# Patient Record
Sex: Male | Born: 1955 | Race: Black or African American | Hispanic: No | Marital: Married | Smoking: Former smoker
Health system: Southern US, Community
[De-identification: ages and names within clinical notes are randomized; demographics above are authoritative.]

## PROBLEM LIST (undated history)

## (undated) DIAGNOSIS — N189 Chronic kidney disease, unspecified: Secondary | ICD-10-CM

## (undated) DIAGNOSIS — F32A Depression, unspecified: Secondary | ICD-10-CM

## (undated) DIAGNOSIS — F329 Major depressive disorder, single episode, unspecified: Secondary | ICD-10-CM

## (undated) DIAGNOSIS — R4701 Aphasia: Secondary | ICD-10-CM

## (undated) DIAGNOSIS — I639 Cerebral infarction, unspecified: Secondary | ICD-10-CM

## (undated) DIAGNOSIS — M1990 Unspecified osteoarthritis, unspecified site: Secondary | ICD-10-CM

## (undated) DIAGNOSIS — I10 Essential (primary) hypertension: Secondary | ICD-10-CM

## (undated) DIAGNOSIS — M109 Gout, unspecified: Secondary | ICD-10-CM

## (undated) DIAGNOSIS — R569 Unspecified convulsions: Secondary | ICD-10-CM

## (undated) DIAGNOSIS — G9050 Complex regional pain syndrome I, unspecified: Secondary | ICD-10-CM

## (undated) DIAGNOSIS — I69391 Dysphagia following cerebral infarction: Secondary | ICD-10-CM

## (undated) HISTORY — PX: NO PAST SURGERIES: SHX2092

---

## 2004-03-24 ENCOUNTER — Emergency Department (HOSPITAL_COMMUNITY): Admission: EM | Admit: 2004-03-24 | Discharge: 2004-03-24 | Payer: Self-pay | Admitting: Emergency Medicine

## 2005-06-21 ENCOUNTER — Emergency Department (HOSPITAL_COMMUNITY): Admission: EM | Admit: 2005-06-21 | Discharge: 2005-06-21 | Payer: Self-pay | Admitting: Emergency Medicine

## 2005-06-26 ENCOUNTER — Ambulatory Visit: Payer: Self-pay | Admitting: Internal Medicine

## 2005-06-27 ENCOUNTER — Ambulatory Visit: Payer: Self-pay | Admitting: *Deleted

## 2005-06-29 ENCOUNTER — Ambulatory Visit: Payer: Self-pay | Admitting: Internal Medicine

## 2005-07-05 ENCOUNTER — Ambulatory Visit: Payer: Self-pay | Admitting: Internal Medicine

## 2005-10-03 ENCOUNTER — Ambulatory Visit: Payer: Self-pay | Admitting: Internal Medicine

## 2005-10-27 ENCOUNTER — Ambulatory Visit: Payer: Self-pay | Admitting: Internal Medicine

## 2005-11-15 ENCOUNTER — Encounter (INDEPENDENT_AMBULATORY_CARE_PROVIDER_SITE_OTHER): Payer: Self-pay | Admitting: Internal Medicine

## 2005-12-01 ENCOUNTER — Ambulatory Visit: Payer: Self-pay | Admitting: Family Medicine

## 2005-12-06 ENCOUNTER — Ambulatory Visit (HOSPITAL_COMMUNITY): Admission: RE | Admit: 2005-12-06 | Discharge: 2005-12-06 | Payer: Self-pay | Admitting: Family Medicine

## 2005-12-08 ENCOUNTER — Ambulatory Visit: Payer: Self-pay | Admitting: Family Medicine

## 2005-12-14 ENCOUNTER — Ambulatory Visit: Payer: Self-pay | Admitting: Family Medicine

## 2006-01-03 ENCOUNTER — Ambulatory Visit: Payer: Self-pay | Admitting: Family Medicine

## 2006-02-02 ENCOUNTER — Emergency Department (HOSPITAL_COMMUNITY): Admission: EM | Admit: 2006-02-02 | Discharge: 2006-02-02 | Payer: Self-pay | Admitting: *Deleted

## 2006-02-07 ENCOUNTER — Ambulatory Visit: Payer: Self-pay | Admitting: Family Medicine

## 2006-02-16 ENCOUNTER — Ambulatory Visit: Payer: Self-pay | Admitting: Family Medicine

## 2006-02-17 LAB — CONVERTED CEMR LAB
CO2: 25 meq/L
Calcium: 9 mg/dL
Creatinine, Ser: 2.33 mg/dL

## 2006-05-04 ENCOUNTER — Ambulatory Visit: Payer: Self-pay | Admitting: Internal Medicine

## 2006-05-05 LAB — CONVERTED CEMR LAB
CO2: 24 meq/L
Calcium: 9.8 mg/dL
Creatinine, Ser: 2.13 mg/dL
Glucose, Bld: 83 mg/dL
Sodium: 143 meq/L

## 2006-07-18 ENCOUNTER — Emergency Department (HOSPITAL_COMMUNITY): Admission: EM | Admit: 2006-07-18 | Discharge: 2006-07-18 | Payer: Self-pay | Admitting: Pediatrics

## 2006-08-18 ENCOUNTER — Encounter (INDEPENDENT_AMBULATORY_CARE_PROVIDER_SITE_OTHER): Payer: Self-pay | Admitting: Internal Medicine

## 2006-08-18 DIAGNOSIS — M109 Gout, unspecified: Secondary | ICD-10-CM

## 2006-08-18 DIAGNOSIS — I10 Essential (primary) hypertension: Secondary | ICD-10-CM

## 2006-08-18 DIAGNOSIS — N185 Chronic kidney disease, stage 5: Secondary | ICD-10-CM

## 2006-09-27 ENCOUNTER — Emergency Department (HOSPITAL_COMMUNITY): Admission: EM | Admit: 2006-09-27 | Discharge: 2006-09-27 | Payer: Self-pay | Admitting: Emergency Medicine

## 2006-12-20 ENCOUNTER — Emergency Department (HOSPITAL_COMMUNITY): Admission: EM | Admit: 2006-12-20 | Discharge: 2006-12-20 | Payer: Self-pay | Admitting: Emergency Medicine

## 2007-01-02 ENCOUNTER — Encounter (INDEPENDENT_AMBULATORY_CARE_PROVIDER_SITE_OTHER): Payer: Self-pay | Admitting: *Deleted

## 2007-01-25 ENCOUNTER — Telehealth (INDEPENDENT_AMBULATORY_CARE_PROVIDER_SITE_OTHER): Payer: Self-pay | Admitting: Internal Medicine

## 2007-02-07 ENCOUNTER — Ambulatory Visit: Payer: Self-pay | Admitting: Nurse Practitioner

## 2007-02-07 DIAGNOSIS — R21 Rash and other nonspecific skin eruption: Secondary | ICD-10-CM | POA: Insufficient documentation

## 2007-02-07 DIAGNOSIS — J309 Allergic rhinitis, unspecified: Secondary | ICD-10-CM | POA: Insufficient documentation

## 2007-02-07 LAB — CONVERTED CEMR LAB
ALT: 11 units/L (ref 0–53)
AST: 13 units/L (ref 0–37)
Albumin: 4.9 g/dL (ref 3.5–5.2)
Basophils Absolute: 0.1 10*3/uL (ref 0.0–0.1)
Basophils Relative: 2 % — ABNORMAL HIGH (ref 0–1)
Calcium: 9.7 mg/dL (ref 8.4–10.5)
Chloride: 102 meq/L (ref 96–112)
MCHC: 32.8 g/dL (ref 30.0–36.0)
Monocytes Relative: 8 % (ref 3–11)
Neutro Abs: 1.5 10*3/uL — ABNORMAL LOW (ref 1.7–7.7)
Neutrophils Relative %: 44 % (ref 43–77)
PSA: 0.54 ng/mL (ref 0.10–4.00)
Potassium: 4.3 meq/L (ref 3.5–5.3)
RBC: 4.55 M/uL (ref 4.22–5.81)
Sodium: 140 meq/L (ref 135–145)
TSH: 1.23 microintl units/mL (ref 0.350–5.50)
Total Protein: 8.4 g/dL — ABNORMAL HIGH (ref 6.0–8.3)
Uric Acid, Serum: 8.7 mg/dL — ABNORMAL HIGH (ref 2.4–7.0)

## 2007-02-25 ENCOUNTER — Encounter (INDEPENDENT_AMBULATORY_CARE_PROVIDER_SITE_OTHER): Payer: Self-pay | Admitting: Nurse Practitioner

## 2007-05-20 ENCOUNTER — Telehealth (INDEPENDENT_AMBULATORY_CARE_PROVIDER_SITE_OTHER): Payer: Self-pay | Admitting: Nurse Practitioner

## 2007-06-25 ENCOUNTER — Telehealth (INDEPENDENT_AMBULATORY_CARE_PROVIDER_SITE_OTHER): Payer: Self-pay | Admitting: Nurse Practitioner

## 2010-10-16 DIAGNOSIS — I639 Cerebral infarction, unspecified: Secondary | ICD-10-CM

## 2010-10-16 HISTORY — DX: Cerebral infarction, unspecified: I63.9

## 2011-02-02 LAB — DIFFERENTIAL
Basophils Relative: 1
Eosinophils Absolute: 0.1
Monocytes Relative: 9
Neutro Abs: 2.7
Neutrophils Relative %: 55

## 2011-02-02 LAB — CBC
MCHC: 34.3
Platelets: 157
RBC: 4.11 — ABNORMAL LOW
WBC: 5

## 2011-02-02 LAB — I-STAT 8, (EC8 V) (CONVERTED LAB)
Acid-base deficit: 5 — ABNORMAL HIGH
BUN: 29 — ABNORMAL HIGH
Bicarbonate: 19.8 — ABNORMAL LOW
Chloride: 113 — ABNORMAL HIGH
HCT: 41
Hemoglobin: 13.9
Operator id: 198171
Sodium: 140

## 2011-02-02 LAB — POCT CARDIAC MARKERS
Myoglobin, poc: 94.3
Operator id: 198171

## 2011-02-02 LAB — D-DIMER, QUANTITATIVE: D-Dimer, Quant: <0.22

## 2011-02-02 LAB — POCT I-STAT CREATININE: Creatinine, Ser: 2.3 — ABNORMAL HIGH

## 2011-04-18 DIAGNOSIS — G9050 Complex regional pain syndrome I, unspecified: Secondary | ICD-10-CM

## 2011-04-18 HISTORY — DX: Complex regional pain syndrome I, unspecified: G90.50

## 2011-05-15 ENCOUNTER — Emergency Department (HOSPITAL_COMMUNITY)
Admission: EM | Admit: 2011-05-15 | Discharge: 2011-05-15 | Disposition: A | Discharge status: Other Acute Inpatient Hospital | Payer: Self-pay | Source: Home / Self Care

## 2011-10-06 ENCOUNTER — Emergency Department (INDEPENDENT_AMBULATORY_CARE_PROVIDER_SITE_OTHER)
Admission: EM | Admit: 2011-10-06 | Discharge: 2011-10-06 | Disposition: A | Discharge status: Home / Self Care | Payer: Self-pay | Source: Home / Self Care | Attending: Family Medicine | Admitting: Family Medicine

## 2011-10-06 ENCOUNTER — Encounter (HOSPITAL_COMMUNITY): Payer: Self-pay

## 2011-10-06 DIAGNOSIS — M7551 Bursitis of right shoulder: Secondary | ICD-10-CM

## 2011-10-06 DIAGNOSIS — M67919 Unspecified disorder of synovium and tendon, unspecified shoulder: Secondary | ICD-10-CM

## 2011-10-06 DIAGNOSIS — M1710 Unilateral primary osteoarthritis, unspecified knee: Secondary | ICD-10-CM

## 2011-10-06 HISTORY — DX: Essential (primary) hypertension: I10

## 2011-10-06 HISTORY — DX: Cerebral infarction, unspecified: I63.9

## 2011-10-06 MED ORDER — METHYLPREDNISOLONE ACETATE 80 MG/ML IJ SUSP
INTRAMUSCULAR | Status: AC
  Filled 2011-10-06: qty 1

## 2011-10-06 MED ORDER — METHYLPREDNISOLONE ACETATE 40 MG/ML IJ SUSP
80.0000 mg | Freq: Once | INTRAMUSCULAR | Status: AC
  Administered 2011-10-06: 80 mg via INTRAMUSCULAR

## 2011-10-06 MED ORDER — METHYLPREDNISOLONE 4 MG PO KIT: PACK | ORAL | Status: AC

## 2011-10-06 NOTE — ED Notes (Signed)
Pt states he had stroke about 6 months ago- reports having "soreness" in rt leg and to rt shoulder since the stroke.

## 2011-10-06 NOTE — ED Provider Notes (Signed)
History     CSN: [id number]  Arrival date & time 10/06/11  1057   First MD Initiated Contact with Patient 10/06/11 1102      Chief Complaint  Patient presents with  . Pain    (Consider location/radiation/quality/duration/timing/severity/associated sxs/prior treatment) Patient is a 56 y.o. male presenting with shoulder pain. The history is provided by the patient.  Shoulder Pain This is a chronic problem. The current episode started more than 1 week ago (24mo h/o pain since right sided cva, also involving right leg.). The problem occurs constantly. The problem has been gradually worsening. Pertinent negatives include no chest pain. He has tried nothing for the symptoms.    Past Medical History  Diagnosis Date  . Hypertension   . Stroke     History reviewed. No pertinent past surgical history.  No family history on file.  History  Substance Use Topics  . Smoking status: Never Smoker   . Smokeless tobacco: Not on file  . Alcohol Use: Yes      Review of Systems  Constitutional: Negative.   Respiratory: Negative for chest tightness.   Cardiovascular: Negative for chest pain and leg swelling.  Gastrointestinal: Negative.   Genitourinary: Negative.   Musculoskeletal: Positive for arthralgias. Negative for joint swelling.    Allergies  Nsaids  Home Medications   Current Outpatient Rx  Name Route Sig Dispense Refill  . PRESCRIPTION MEDICATION  Pt states he is on BP medication and blood thinner, cannot recall all of medications    . METHYLPREDNISOLONE 4 MG PO KIT  follow package directions start on sat, take until finished 21 tablet 0    BP 123/61  Pulse 76  Temp 98 F (36.7 C) (Oral)  Resp 20  SpO2 98%  Physical Exam  Nursing note and vitals reviewed. Constitutional: He is oriented to person, place, and time. He appears well-developed and well-nourished.  Abdominal: Bowel sounds are normal. There is no tenderness.  Musculoskeletal: He exhibits tenderness.         Arms: Neurological: He is alert and oriented to person, place, and time.  Skin: Skin is warm and dry.    ED Course  Procedures (including critical care time)  Labs Reviewed - No data to display No results found.   1. Bursitis of shoulder, right   2. Degenerative arthritis of knee       MDM         Linna Hoff, MD 10/06/11 1144

## 2011-10-06 NOTE — Discharge Instructions (Signed)
Take medicine as prescribed and see orthopedist if further problems.

## 2011-12-30 ENCOUNTER — Inpatient Hospital Stay (HOSPITAL_COMMUNITY): Payer: Medicaid Other

## 2011-12-30 ENCOUNTER — Encounter (HOSPITAL_COMMUNITY): Payer: Self-pay | Admitting: Internal Medicine

## 2011-12-30 ENCOUNTER — Emergency Department (HOSPITAL_COMMUNITY): Payer: Medicaid Other

## 2011-12-30 ENCOUNTER — Inpatient Hospital Stay (HOSPITAL_COMMUNITY)
Admission: EM | Admit: 2011-12-30 | Discharge: 2012-01-03 | DRG: 065 | Disposition: A | Discharge status: Rehabilitation Facility | Payer: Medicaid Other | Attending: Neurology | Admitting: Neurology

## 2011-12-30 DIAGNOSIS — I629 Nontraumatic intracranial hemorrhage, unspecified: Secondary | ICD-10-CM

## 2011-12-30 DIAGNOSIS — I120 Hypertensive chronic kidney disease with stage 5 chronic kidney disease or end stage renal disease: Secondary | ICD-10-CM | POA: Diagnosis present

## 2011-12-30 DIAGNOSIS — Z6834 Body mass index (BMI) 34.0-34.9, adult: Secondary | ICD-10-CM

## 2011-12-30 DIAGNOSIS — Z8673 Personal history of transient ischemic attack (TIA), and cerebral infarction without residual deficits: Secondary | ICD-10-CM

## 2011-12-30 DIAGNOSIS — I619 Nontraumatic intracerebral hemorrhage, unspecified: Principal | ICD-10-CM

## 2011-12-30 DIAGNOSIS — I10 Essential (primary) hypertension: Secondary | ICD-10-CM | POA: Diagnosis present

## 2011-12-30 DIAGNOSIS — J309 Allergic rhinitis, unspecified: Secondary | ICD-10-CM

## 2011-12-30 DIAGNOSIS — M109 Gout, unspecified: Secondary | ICD-10-CM

## 2011-12-30 DIAGNOSIS — F32A Depression, unspecified: Secondary | ICD-10-CM

## 2011-12-30 DIAGNOSIS — N185 Chronic kidney disease, stage 5: Secondary | ICD-10-CM | POA: Diagnosis present

## 2011-12-30 DIAGNOSIS — G8190 Hemiplegia, unspecified affecting unspecified side: Secondary | ICD-10-CM | POA: Diagnosis present

## 2011-12-30 DIAGNOSIS — I69920 Aphasia following unspecified cerebrovascular disease: Secondary | ICD-10-CM

## 2011-12-30 DIAGNOSIS — G4733 Obstructive sleep apnea (adult) (pediatric): Secondary | ICD-10-CM | POA: Diagnosis present

## 2011-12-30 DIAGNOSIS — E66811 Obesity, class 1: Secondary | ICD-10-CM | POA: Diagnosis present

## 2011-12-30 DIAGNOSIS — I69320 Aphasia following cerebral infarction: Secondary | ICD-10-CM

## 2011-12-30 DIAGNOSIS — F329 Major depressive disorder, single episode, unspecified: Secondary | ICD-10-CM

## 2011-12-30 DIAGNOSIS — E870 Hyperosmolality and hypernatremia: Secondary | ICD-10-CM | POA: Diagnosis not present

## 2011-12-30 DIAGNOSIS — N289 Disorder of kidney and ureter, unspecified: Secondary | ICD-10-CM | POA: Diagnosis present

## 2011-12-30 DIAGNOSIS — R1310 Dysphagia, unspecified: Secondary | ICD-10-CM | POA: Diagnosis present

## 2011-12-30 DIAGNOSIS — I610 Nontraumatic intracerebral hemorrhage in hemisphere, subcortical: Secondary | ICD-10-CM | POA: Diagnosis present

## 2011-12-30 DIAGNOSIS — E669 Obesity, unspecified: Secondary | ICD-10-CM | POA: Diagnosis present

## 2011-12-30 DIAGNOSIS — R471 Dysarthria and anarthria: Secondary | ICD-10-CM | POA: Diagnosis present

## 2011-12-30 DIAGNOSIS — I6930 Unspecified sequelae of cerebral infarction: Secondary | ICD-10-CM

## 2011-12-30 DIAGNOSIS — R21 Rash and other nonspecific skin eruption: Secondary | ICD-10-CM

## 2011-12-30 LAB — DIFFERENTIAL
Eosinophils Absolute: 0.4 10*3/uL (ref 0.0–0.7)
Lymphs Abs: 2.5 10*3/uL (ref 0.7–4.0)
Monocytes Absolute: 0.5 10*3/uL (ref 0.1–1.0)
Neutrophils Relative %: 44 % (ref 43–77)

## 2011-12-30 LAB — PROTIME-INR
INR: 0.93 (ref 0.00–1.49)
Prothrombin Time: 12.7 seconds (ref 11.6–15.2)

## 2011-12-30 LAB — COMPREHENSIVE METABOLIC PANEL
AST: 22 U/L (ref 0–37)
BUN: 39 mg/dL — ABNORMAL HIGH (ref 6–23)
CO2: 21 mEq/L (ref 19–32)
Calcium: 10.1 mg/dL (ref 8.4–10.5)
Creatinine, Ser: 2.17 mg/dL — ABNORMAL HIGH (ref 0.50–1.35)
GFR calc non Af Amer: 32 mL/min — ABNORMAL LOW (ref >90)

## 2011-12-30 LAB — POCT I-STAT, CHEM 8
BUN: 46 mg/dL — ABNORMAL HIGH (ref 6–23)
Calcium, Ion: 1.19 mmol/L (ref 1.12–1.23)
Creatinine, Ser: 2.2 mg/dL — ABNORMAL HIGH (ref 0.50–1.35)
Glucose, Bld: 90 mg/dL (ref 70–99)
Hemoglobin: 15 g/dL (ref 13.0–17.0)
Sodium: 142 mEq/L (ref 135–145)
TCO2: 22 mmol/L (ref 0–100)

## 2011-12-30 LAB — CBC
MCH: 30.5 pg (ref 26.0–34.0)
MCHC: 34.6 g/dL (ref 30.0–36.0)
Platelets: 125 10*3/uL — ABNORMAL LOW (ref 150–400)

## 2011-12-30 MED ORDER — SODIUM CHLORIDE 3 % IV SOLN
INTRAVENOUS | Status: DC
  Administered 2011-12-30 – 2011-12-31 (×3): via INTRAVENOUS
  Filled 2011-12-30 (×8): qty 500

## 2011-12-30 MED ORDER — LABETALOL HCL 5 MG/ML IV SOLN
INTRAVENOUS | Status: AC
  Administered 2011-12-30: 20 mg via INTRAVENOUS
  Filled 2011-12-30: qty 4

## 2011-12-30 MED ORDER — SODIUM CHLORIDE 0.9 % IV SOLN
250.0000 mL | INTRAVENOUS | Status: DC | PRN
  Administered 2011-12-30: 250 mL via INTRAVENOUS

## 2011-12-30 MED ORDER — NICARDIPINE HCL IN NACL 20-0.86 MG/200ML-% IV SOLN
5.0000 mg/h | INTRAVENOUS | Status: DC
  Administered 2011-12-30 – 2012-01-01 (×9): 5 mg/h via INTRAVENOUS
  Filled 2011-12-30 (×11): qty 200

## 2011-12-30 MED ORDER — NICARDIPINE HCL IN NACL 20-0.86 MG/200ML-% IV SOLN: 5.0000 mg/h | INTRAVENOUS | Status: DC

## 2011-12-30 MED ORDER — PANTOPRAZOLE SODIUM 40 MG IV SOLR
40.0000 mg | INTRAVENOUS | Status: DC
  Administered 2011-12-30 – 2012-01-02 (×3): 40 mg via INTRAVENOUS
  Filled 2011-12-30 (×6): qty 40

## 2011-12-30 MED ORDER — LABETALOL HCL 5 MG/ML IV SOLN
20.0000 mg | Freq: Once | INTRAVENOUS | Status: AC
  Administered 2011-12-30: 20 mg via INTRAVENOUS

## 2011-12-30 NOTE — ED Notes (Signed)
MD aware of pt critical BP. Pt breathing very irregular, is coughing, wheezing, and appears in distress at times. Dr. Ethelda Chick aware.

## 2011-12-30 NOTE — ED Notes (Signed)
Per ems- ems called out for new onset of left sided deficits. Pt has hx of cva in July with Right sided deficits. Pt was talking initially, was weak on left side, however, at arrival to hospital, pt was no longer speaking or smiling. Pt is alert, unable to answer questions, will follow commands. CBG:84 BP-184/127 HR-86 NSR Spo2-100% 2L  20g IV Right AC

## 2011-12-30 NOTE — ED Notes (Signed)
CCM at bedside for central line placement 

## 2011-12-30 NOTE — ED Notes (Signed)
Pt resting, pt maintaining airway at this time. Central line site is clean and dry. Xray at bedside for portable chest. Pt informed on Plan of care. Pt nodded his head to signify understanding to this RN.

## 2011-12-30 NOTE — Consult Note (Signed)
Consulting physician: Dr. Ethelda Chick Reason for consult: Left hemiplegia, Aphasia, left hemisensory loss Charts, medications, labs and images reviewed  Onset of symptoms: 6:45 pm      Chief Complaint: Left hemiplegia, aphasia HPI: This is a 56 y/o RHAAM   ( most of the history obtained from the patient's spouse) Who had a recent ischemic stroke on the left side with some right sided weakness. Patient has a history of poorly controlled hypertension and has stopped taking medication ( Both the bottles of Lisinopril and Amlodipine were empty and the family member mentions they did not have time to refill) Patient had headaches all day and had not taken his BP medications. At 6:45 pm patient had acute onset of left hemiplegia and was unable to talk, EMS was called and brought to the ED.  When EMS saw him his BP was 187/127 and upon arrival the the ED, the monitor displayed 254/143.  Cardene drip was ordered but until then patient received 20 mg of Labetalol . Patient has recent ischemic stroke on 10/21/2011 on the left MCA  While he was  in [place], [place].  However, he does have some residual weakness on the right side.  Patient seen at the bedside, he follows commands and able to pronounce few words.  NIH scale of 11  Patient has been breathing very shallow and coughing and choking.   Patient is a full code   LSN:  6:45 pm  But had headaches all day tPA Given:NO Because of hemorrhagic stroke  Past Medical History  Diagnosis Date  . Hypertension   . Stroke     . Gout . Sleep Apnea  No past surgical history on file.  No family history on file. Social History:  reports that he has never smoked. He does not have any smokeless tobacco history on file. He reports that he drinks alcohol. He reports that he does not use illicit drugs.  He has passive exposure to smoking  Allergies:  Allergies  Allergen Reactions  . Nsaids     Unknown     (Not in a hospital  admission)  ROS: Unable to obtain  Physical Examination: Blood pressure 235/145, temperature 98.9 F (37.2 C), temperature source Oral, resp. rate 16, SpO2 98.00%.  General Examination: HEENT-  Normocephalic, no lesions, without obvious abnormality.  Normal external eye and conjunctiva.  Normal TM's bilaterally.  Normal auditory canals and external ears. Normal external nose, mucus membranes and septum.  Normal pharynx. Neck supple with no masses, nodes, nodules or enlargement. Cardiovascular - IRR, no murmurs, rubs and or gallops Lungs - CTA Abdomen - Umbilical Hernia, Obese, Bowel sounds present Extremities -  No edema Skin; No rash    Neuro:  Mental Status: Awake, Alert and follows minimal commands  Speech: Significant dysarthric without aphasia  Cranial Nerves  Pupils reactive, Corneal reactive, No neglect EOMI No ptosis No vision field cut No Facial sensory  Deficits No hearing deficits GAG intact Tongue deviated to the left No platysma paralysis No facial droop   Motor: Right side UE: 4+/5 Right LE:  5-/5  Left UE:  Proximal: 2/5 and distal 2+/5 Left LE:   0/5 p   Laboratory Studies:   Basic Metabolic Panel:  Lab 12/30/11 1610  NA 142  K 4.5  CL 111  CO2 --  GLUCOSE 90  BUN 46*  CREATININE 2.20*  CALCIUM --  MG --  PHOS --    Liver Function Tests: No results found for this basename:  AST:5,ALT:5,ALKPHOS:5,BILITOT:5,PROT:5,ALBUMIN:5 in the last 168 hours No results found for this basename: LIPASE:5,AMYLASE:5 in the last 168 hours No results found for this basename: AMMONIA:3 in the last 168 hours  CBC:  Lab 12/30/11 2008  WBC --  NEUTROABS --  HGB 15.0  HCT 44.0  MCV --  PLT --    Cardiac Enzymes: No results found for this basename: CKTOTAL:5,CKMB:5,CKMBINDEX:5,TROPONINI:5 in the last 168 hours  BNP: No components found with this basename: POCBNP:5  CBG:  Lab 12/30/11 2019  GLUCAP 82    Microbiology: No results found  for this or any previous visit.  Coagulation Studies: No results found for this basename: LABPROT:5,INR:5 in the last 72 hours  Urinalysis: No results found for this basename: COLORURINE:2,APPERANCEUR:2,LABSPEC:2,PHURINE:2,GLUCOSEU:2,HGBUR:2,BILIRUBINUR:2,KETONESUR:2,PROTEINUR:2,UROBILINOGEN:2,NITRITE:2,LEUKOCYTESUR:2 in the last 168 hours  Lipid Panel:     Component Value Date/Time   CHOL 238* 02/07/2007 2120   TRIG 99 02/07/2007 2120   HDL 86 02/07/2007 2120   CHOLHDL 2.8 Ratio 02/07/2007 2120   VLDL 20 02/07/2007 2120   LDLCALC 132* 02/07/2007 2120    HgbA1C:  No results found for this basename: HGBA1C    Urine Drug Screen:   No results found for this basename: labopia, cocainscrnur, labbenz, amphetmu, thcu, labbarb    Alcohol Level: No results found for this basename: ETH:2 in the last 168 hours  Other results: EKG: normal EKG, normal sinus rhythm, unchanged from previous tracings.  Imaging: Ct Head Wo Contrast  12/30/2011  *RADIOLOGY REPORT*  Clinical Data: Acute left-sided weakness and aphasia.  Code stroke.  CT HEAD WITHOUT CONTRAST  Technique:  Contiguous axial images were obtained from the base of the skull through the vertex without contrast.  Comparison: None  Findings: A 1.8 x 2.1 cm right thalamic/basal ganglia hemorrhage is noted with extension into the right lateral ventricle.  3 mm right to left midline shift is identified with mild fullness of the left lateral ventricle.  Chronic small vessel white matter ischemic changes are noted as well as a remote left basil ganglia infarct. The basilar cisterns are patent.  No acute or suspicious bony abnormalities are identified.  IMPRESSION: 1.8 x 2.1 cm right thalamic/basal ganglia hemorrhage with right intraventricular extension and 3 mm right to left midline shift. Mild fullness of the left lateral ventricle  identified.  Chronic small vessel white matter ischemic changes and remote left basal ganglia infarct.  Critical  Value/emergent results were called by telephone at the time of interpretation on 12/30/2011 at 8:05 p.m. to Dr. Ethelda Chick, who verbally acknowledged these results.   Original Report Authenticated By: Rosendo Gros, M.D.     Assessment: 56 y.o. male with  Left hemiplegia and speech impairment and  Hypertensive crisis comes and head CT reveals hemorrhage on the  Right side.   This is secondary to hypertensive bleed. Patient is started on Cardene drip.   Discussed with the Neurosurgeon; Not a candidate for surgery Discussed with ICU team by the ED physician  CT scan already shows midline shift and it may get worse by tomorrow.  Will start patient on 3% Na at the rate of 55 cc/hr and target of 155- 160 of Na Do not exceed 65 cc /hr   Repeat Head CT tomorrow  Tight BP control monitor SBP less than 150 No ASA or  Heparin for DVT  Please use sequentials  Patient condition is guarded Discussed with the patient's wife.    Aster Eckrich V-P Eilleen Kempf., MD., Ph.D.,MS 12/30/2011 8:53 PM

## 2011-12-30 NOTE — ED Notes (Signed)
CCM at bedside 

## 2011-12-30 NOTE — Procedures (Signed)
Central Venous Catheter Insertion Procedure Note JONNATAN APPLEBY [id number] December 31, 1955  Procedure: Insertion of Central Venous Catheter Indications: Drug and/or fluid administration  Procedure Details Consent: Risks of procedure as well as the alternatives and risks of each were explained to the (patient/caregiver).  Consent for procedure obtained. Time Out: Verified patient identification, verified procedure, site/side was marked, verified correct patient position, special equipment/implants available, medications/allergies/relevent history reviewed, required imaging and test results available.  Performed  Maximum sterile technique was used including antiseptics, cap, gloves, gown, hand hygiene, mask and sheet. Skin prep: Chlorhexidine; local anesthetic administered A antimicrobial bonded/coated triple lumen catheter was placed in the left subclavian vein using the Seldinger technique.  Evaluation Blood flow good Complications: No apparent complications Patient did tolerate procedure well. Chest X-ray ordered to verify placement.  CXR: normal.  Lavella Hammock, M.D. Pulmonary and Critical Care Medicine Call E-link with questions [phone] 12/30/2011, 08:51 PM

## 2011-12-30 NOTE — H&P (Signed)
Name: HELAMAN MECCA MRN: [id number] DOB: 04-Aug-1955    LOS: 0  PULMONARY / CRITICAL CARE MEDICINE  HPI:  Mr. Brendlinger is a 56 year old gentleman with a past medical history significant for previous stroke, leaving him with right-sided deficits, who presented today with weakness and slurring of speech. He underwent CT scan which demonstrated a basal ganglia hemorrhage. We were consulted for admission.  Past Medical History  Diagnosis Date  . Hypertension   . Stroke    No past surgical history on file. Prior to Admission medications   Medication Sig Start Date End Date Taking? Authorizing Provider  allopurinol (ZYLOPRIM) 100 MG tablet Take 100 mg by mouth daily.   Yes Historical Provider, MD  ARIPiprazole (ABILIFY) 2 MG tablet Take 2 mg by mouth daily.   Yes Historical Provider, MD  lisinopril-hydrochlorothiazide (PRINZIDE,ZESTORETIC) 20-25 MG per tablet Take 1 tablet by mouth daily.   Yes Historical Provider, MD  NIFEdipine (PROCARDIA XL/ADALAT-CC) 60 MG 24 hr tablet Take 60 mg by mouth daily.   Yes Historical Provider, MD  STUDY MEDICATION Take 1 tablet by mouth daily. Either Febexustat, Allopurinol, or Placebo   Yes Historical Provider, MD   Allergies Allergies  Allergen Reactions  . Nsaids     Unknown    Family History No family history on file. Social History  reports that he has never smoked. He does not have any smokeless tobacco history on file. He reports that he drinks alcohol. He reports that he does not use illicit drugs.  Review Of Systems:  Unable to provide.  Brief patient description:  56 year old gentleman with intraparenchymal hemorrhage, status post hypertonic saline.  Events Since Admission: None  Current Status:  Vital Signs: Temp:  [98.9 F (37.2 C)] 98.9 F (37.2 C) (09/14 2016) Pulse Rate:  [82-96] 96  (09/14 2039) Resp:  [11-28] 17  (09/14 2039) BP: (162-254)/(118-145) 162/118 mmHg (09/14 2039) SpO2:  [98 %-100 %] 100 % (09/14 2039)  Physical  Examination: General:  No acute distress Neuro:  Gag intact, following limited commands. HEENT:  PERRL, pink conjunctivae, moist membranes Neck:  Supple, no JVD   Cardiovascular:  RRR, no M/R/G Lungs:  Bilateral diminished air entry, no W/R/R Abdomen:  Soft, nontender, nondistended, bowel sounds present Musculoskeletal:  Moves all extremities, no pedal edema Skin:  No rash   ASSESSMENT AND PLAN Principal Problem:  *Unspecified intracranial hemorrhage Active Problems:  HYPERTENSION  KIDNEY DISEASE, CHRONIC, STAGE V  NEUROLOGIC  A:  Intracranial hemorrhage P:   Per neurosurgery will get hypertonic saline. A central line was placed, and the patient will be admitted to the 9 ICU for further monitoring status. Thus far he is protecting his airway and will hold off intubating long as possible given risk of elevating ICP. Should mental status decline or he become hypoxic will have to proceed with intubation.   RENAL  Lab 12/30/11 2008  NA 142  K 4.5  CL 111  CO2 --  BUN 46*  CREATININE 2.20*  CALCIUM --  MG --  PHOS --   Intake/Output    None    Foley:  None  A:  Chronic kidney disease stage V P:   Will follow urine output and evaluate for any changes in status.  ENDOCRINE  Lab 12/30/11 2019  GLUCAP 82   A:  Tight glucose control   P:   Tight glucose control in the setting of intracranial hemorrhage. Thus far glucoses have been adequately controlled.    BEST PRACTICE / DISPOSITION -  Level of Care:  ICU - Primary Service:  PCCM - Consultants:  Neurology, neurosurgery - Code Status:  Full - Diet:  N.p.o. pending swallow study - DVT Px:  SCDs - GI Px:  Protonix - Skin Integrity:  Good - Social / Family:  Family updated at bedside  The patient is critically ill with multiple organ systems failure and requires high complexity decision making for assessment and support, frequent evaluation and titration of therapies, application of advanced monitoring  technologies and extensive interpretation of multiple databases.   Critical Care Time devoted to patient care services described in this note is: 56 Minutes  Lavella Hammock, M.D. Pulmonary and Critical Care Medicine [facility] Pager: [phone]  12/30/2011, 8:55 PM

## 2011-12-30 NOTE — ED Provider Notes (Signed)
History     CSN: [id number]  Arrival date & time 12/30/11  1947   First MD Initiated Contact with Patient 12/30/11 1954      Chief Complaint  Patient presents with  . Code Stroke   Lower 5 cabby altered mental status. History obtained from wife and from paramedics (Consider location/radiation/quality/duration/timing/severity/associated sxs/prior treatment) HPI Patient presents after he was unable to get up from a chair 40 minutes prior to coming here. His wife states that he was speaking in mumbles. He was unable to stand. EMS performed CBG which was 86. Wife reported that the patient complained of a headache all day. Past Medical History  Diagnosis Date  . Hypertension   . Stroke     No past surgical history on file.  No family history on file.  History  Substance Use Topics  . Smoking status: Never Smoker   . Smokeless tobacco: Not on file  . Alcohol Use: Yes      Review of Systems  Unable to perform ROS: Mental status change  Constitutional: Negative.   Respiratory: Negative.   Cardiovascular: Negative.   Gastrointestinal: Negative.   Musculoskeletal: Negative.   Skin: Negative.   Neurological: Positive for speech difficulty and headaches.  Hematological: Negative.   Psychiatric/Behavioral: Negative.     Allergies  Nsaids  Home Medications   Current Outpatient Rx  Name Route Sig Dispense Refill  . ALLOPURINOL 100 MG PO TABS Oral Take 100 mg by mouth daily.    . ARIPIPRAZOLE 2 MG PO TABS Oral Take 2 mg by mouth daily.    Marland Kitchen LISINOPRIL-HYDROCHLOROTHIAZIDE 20-25 MG PO TABS Oral Take 1 tablet by mouth daily.    Marland Kitchen NIFEDIPINE ER OSMOTIC 60 MG PO TB24 Oral Take 60 mg by mouth daily.    . STUDY MEDICATION Oral Take 1 tablet by mouth daily. Either Febexustat, Allopurinol, or Placebo      BP 235/145  Resp 16  SpO2 98%  Physical Exam  Nursing note and vitals reviewed. Constitutional: He appears well-developed and well-nourished.       Speech  incomprehensible  HENT:  Head: Normocephalic and atraumatic.  Eyes: Conjunctivae normal are normal. Pupils are equal, round, and reactive to light.       Eyes deviated to right  Neck: Neck supple. No tracheal deviation present. No thyromegaly present.  Cardiovascular: Normal rate and regular rhythm.   No murmur heard. Pulmonary/Chest: Effort normal and breath sounds normal.  Abdominal: Soft. Bowel sounds are normal. He exhibits no distension. There is no tenderness.  Musculoskeletal: Normal range of motion. He exhibits no edema and no tenderness.  Neurological: He is alert. Coordination normal.       Gag reflex intact. 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 DTRs symmetric bilaterally at knee jerk ankle jerk and biceps toes downgoing bilaterally  Skin: Skin is warm and dry. No rash noted.  Psychiatric: He has a normal mood and affect.    ED Course  Procedures (including critical care time)  Labs Reviewed  POCT I-STAT, CHEM 8 - Abnormal; Notable for the following:    BUN 46 (*)     Creatinine, Ser 2.20 (*)     All other components within normal limits   Ct Head Wo Contrast  12/30/2011  *RADIOLOGY REPORT*  Clinical Data: Acute left-sided weakness and aphasia.  Code stroke.  CT HEAD WITHOUT  CONTRAST  Technique:  Contiguous axial images were obtained from the base of the skull through the vertex without contrast.  Comparison: None  Findings: A 1.8 x 2.1 cm right thalamic/basal ganglia hemorrhage is noted with extension into the right lateral ventricle.  3 mm right to left midline shift is identified with mild fullness of the left lateral ventricle.  Chronic small vessel white matter ischemic changes are noted as well as a remote left basil ganglia infarct. The basilar cisterns are patent.  No acute or suspicious bony abnormalities are  identified.  IMPRESSION: 1.8 x 2.1 cm right thalamic/basal ganglia hemorrhage with right intraventricular extension and 3 mm right to left midline shift. Mild fullness of the left lateral ventricle  identified.  Chronic small vessel white matter ischemic changes and remote left basal ganglia infarct.  Critical Value/emergent results were called by telephone at the time of interpretation on 12/30/2011 at 8:05 p.m. to Dr. Ethelda Chick, who verbally acknowledged these results.   Original Report Authenticated By: Rosendo Gros, M.D.     Date: 12/30/2011  Rate: 85  Rhythm: normal sinus rhythm  QRS Axis: left  Intervals: normal  ST/T Wave abnormalities: nonspecific T wave changes  Conduction Disutrbances:none  Narrative Interpretation:   Old EKG Reviewed: No significant change from 09/27/2006 as interpreted by me  Results for orders placed during the hospital encounter of 12/30/11  POCT I-STAT, CHEM 8      Component Value Range   Sodium 142  135 - 145 mEq/L   Potassium 4.5  3.5 - 5.1 mEq/L   Chloride 111  96 - 112 mEq/L   BUN 46 (*) 6 - 23 mg/dL   Creatinine, Ser 7.82 (*) 0.50 - 1.35 mg/dL   Glucose, Bld 90  70 - 99 mg/dL   Calcium, Ion 9.56  2.13 - 1.23 mmol/L   TCO2 22  0 - 100 mmol/L   Hemoglobin 15.0  13.0 - 17.0 g/dL   HCT 08.6  57.8 - 46.9 %  GLUCOSE, CAPILLARY      Component Value Range   Glucose-Capillary 82  70 - 99 mg/dL   Ct Head Wo Contrast  12/30/2011  *RADIOLOGY REPORT*  Clinical Data: Acute left-sided weakness and aphasia.  Code stroke.  CT HEAD WITHOUT CONTRAST  Technique:  Contiguous axial images were obtained from the base of the skull through the vertex without contrast.  Comparison: None  Findings: A 1.8 x 2.1 cm right thalamic/basal ganglia hemorrhage is noted with extension into the right lateral ventricle.  3 mm right to left midline shift is identified with mild fullness of the left lateral ventricle.  Chronic small vessel white matter ischemic changes are noted as well  as a remote left basil ganglia infarct. The basilar cisterns are patent.  No acute or suspicious bony abnormalities are identified.  IMPRESSION: 1.8 x 2.1 cm right thalamic/basal ganglia hemorrhage with right intraventricular extension and 3 mm right to left midline shift. Mild fullness of the left lateral ventricle  identified.  Chronic small vessel white matter ischemic changes and remote left basal ganglia infarct.  Critical Value/emergent results were called by telephone at the time of interpretation on 12/30/2011 at 8:05 p.m. to Dr. Ethelda Chick, who verbally acknowledged these results.   Original Report Authenticated By: Rosendo Gros, M.D.    No diagnosis found.  Nicardipine intravenous drip ordered as patient felt to have a hypertensive emergency. Labetalol ordered to control blood pressure.  At 8:15 PM patient's exam is essentially unchanged. He is  a phasic. Able to follow simple commands and move right upper and right lower extremities on command. 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 The thyroid by neurology. I was notified of intracerebral bleed by Dr. Si Gaul. CT scan reviewed by me . Spoke with Dr. Yetta Barre from neurosurgery he states patient is nonoperative candidate. In house neurologist recommended patient be admitted to pulmonary critical care service. Spoke with the intensivist will come to evaluate patient for admission Diagnoses #1 acute intracerebral bleed Diagnosis #2 hypertensive emergency  CRITICAL CARE Performed by: Doug Sou  ?  Total critical care time: *30 minute Critical care time was exclusive of separately billable procedures and treating other patients.  Critical care was necessary to treat or prevent imminent or life-threatening deterioration.  Critical care was time spent personally by me on the following activities: development of treatment plan with patient and/or surrogate  as well as nursing, discussions with consultants, evaluation of patient's response to treatment, examination of patient, obtaining history from patient or surrogate, ordering and performing treatments and interventions, ordering and review of laboratory studies, ordering and review of radiographic studies, pulse oximetry and re-evaluation of patient's condition.      Doug Sou, MD 12/30/11 2027

## 2011-12-30 NOTE — Code Documentation (Signed)
56 yo bm brought in via [facility] with acute onset Lt side weakness & aphasia.  Pt stopped taking his BP meds about one month ago & has c/o HA throughout the day.  At 1845 the pt's wife stated he developed acute Lt side weakness & inability to speak.  On arrival to ED pt BP 238/120 & labetalol IV was given then Cardene gtt started.  NIH 11 lt side weakness, aphasia, & facial droop.  Code stroke encoded 99, code stroke called 1938, pt arrival 63, EDP exam 1947, stroke team arrival 89, LSN 85, pt arrival in CT 1950, phlebotomost arrival 55.  Pt is not a tPA candidate due to ICH. Pt referred to [facility] for management.

## 2011-12-31 ENCOUNTER — Encounter (HOSPITAL_COMMUNITY): Payer: Self-pay | Admitting: *Deleted

## 2011-12-31 ENCOUNTER — Inpatient Hospital Stay (HOSPITAL_COMMUNITY): Payer: Medicaid Other

## 2011-12-31 DIAGNOSIS — M109 Gout, unspecified: Secondary | ICD-10-CM

## 2011-12-31 DIAGNOSIS — J309 Allergic rhinitis, unspecified: Secondary | ICD-10-CM

## 2011-12-31 DIAGNOSIS — R21 Rash and other nonspecific skin eruption: Secondary | ICD-10-CM

## 2011-12-31 DIAGNOSIS — N185 Chronic kidney disease, stage 5: Secondary | ICD-10-CM

## 2011-12-31 LAB — BASIC METABOLIC PANEL
BUN: 36 mg/dL — ABNORMAL HIGH (ref 6–23)
BUN: 31 mg/dL — ABNORMAL HIGH (ref 6–23)
CO2: 25 mEq/L (ref 19–32)
Calcium: 9.6 mg/dL (ref 8.4–10.5)
Calcium: 9.8 mg/dL (ref 8.4–10.5)
Chloride: 110 mEq/L (ref 96–112)
Chloride: 113 mEq/L — ABNORMAL HIGH (ref 96–112)
Creatinine, Ser: 2.05 mg/dL — ABNORMAL HIGH (ref 0.50–1.35)
Creatinine, Ser: 2.01 mg/dL — ABNORMAL HIGH (ref 0.50–1.35)
GFR calc Af Amer: 38 mL/min — ABNORMAL LOW (ref >90)
GFR calc Af Amer: 40 mL/min — ABNORMAL LOW (ref >90)
GFR calc Af Amer: 41 mL/min — ABNORMAL LOW (ref >90)
GFR calc non Af Amer: 33 mL/min — ABNORMAL LOW (ref >90)
GFR calc non Af Amer: 35 mL/min — ABNORMAL LOW (ref >90)
Glucose, Bld: 104 mg/dL — ABNORMAL HIGH (ref 70–99)
Glucose, Bld: 113 mg/dL — ABNORMAL HIGH (ref 70–99)
Potassium: 4.4 mEq/L (ref 3.5–5.1)
Potassium: 4.4 mEq/L (ref 3.5–5.1)
Sodium: 142 mEq/L (ref 135–145)
Sodium: 143 mEq/L (ref 135–145)
Sodium: 149 mEq/L — ABNORMAL HIGH (ref 135–145)

## 2011-12-31 LAB — MAGNESIUM: Magnesium: 1.9 mg/dL (ref 1.5–2.5)

## 2011-12-31 LAB — CBC
HCT: 36 % — ABNORMAL LOW (ref 39.0–52.0)
Hemoglobin: 12.1 g/dL — ABNORMAL LOW (ref 13.0–17.0)
RBC: 4.07 MIL/uL — ABNORMAL LOW (ref 4.22–5.81)

## 2011-12-31 LAB — BLOOD GAS, ARTERIAL
Bicarbonate: 22.3 mEq/L (ref 20.0–24.0)
Patient temperature: 98.6
pCO2 arterial: 38.9 mmHg (ref 35.0–45.0)
pH, Arterial: 7.377 (ref 7.350–7.450)
pO2, Arterial: 130 mmHg — ABNORMAL HIGH (ref 80.0–100.0)

## 2011-12-31 LAB — PHOSPHORUS: Phosphorus: 2.8 mg/dL (ref 2.3–4.6)

## 2011-12-31 LAB — TROPONIN I: Troponin I: <0.3 ng/mL (ref <0.30)

## 2011-12-31 LAB — MRSA PCR SCREENING: MRSA by PCR: NEGATIVE

## 2011-12-31 MED ORDER — BIOTENE DRY MOUTH MT LIQD
15.0000 mL | Freq: Two times a day (BID) | OROMUCOSAL | Status: DC
  Administered 2011-12-31 (×2): 15 mL via OROMUCOSAL

## 2011-12-31 MED ORDER — BIOTENE DRY MOUTH MT LIQD
15.0000 mL | Freq: Two times a day (BID) | OROMUCOSAL | Status: DC
  Administered 2012-01-01 – 2012-01-02 (×3): 15 mL via OROMUCOSAL

## 2011-12-31 MED ORDER — WHITE PETROLATUM GEL
Status: AC
  Administered 2011-12-31: 09:00:00
  Filled 2011-12-31: qty 5

## 2011-12-31 MED ORDER — CHLORHEXIDINE GLUCONATE 0.12 % MT SOLN
15.0000 mL | Freq: Two times a day (BID) | OROMUCOSAL | Status: DC
  Administered 2012-01-01 – 2012-01-03 (×5): 15 mL via OROMUCOSAL
  Filled 2011-12-31 (×8): qty 15

## 2011-12-31 MED ORDER — ACETAMINOPHEN 650 MG RE SUPP: 650.0000 mg | Freq: Four times a day (QID) | RECTAL | Status: DC | PRN

## 2011-12-31 MED ORDER — INFLUENZA VIRUS VACC SPLIT PF IM SUSP
0.5000 mL | INTRAMUSCULAR | Status: AC
  Administered 2012-01-01: 0.5 mL via INTRAMUSCULAR
  Filled 2011-12-31: qty 0.5

## 2011-12-31 NOTE — Evaluation (Signed)
Clinical/Bedside Swallow Evaluation Patient Details  Name: Collin Diaz MRN: [id number] Date of Birth: 1955/06/06  Today's Date: 12/31/2011 Time: 1500-1530 SLP Time Calculation (min): 30 min  Past Medical History:  Past Medical History  Diagnosis Date  . Hypertension   . Stroke    Past Surgical History: History reviewed. No pertinent past surgical history. HPI:  56 y/o male admitted to ED  with weakness and slurring of speech. PMH significant for prior stroke with residual right-sided deficits. CT indicates basal ganglia hemorrhage.   Patient referred for BSE per stroke protocol.    Assessment / Plan / Recommendation Clinical Impression  Dysphagia indicated due to decreased sensory and weakness. +s/s of aspiration noted even with limited PO trials.  Recommend continued NPO status including medication.  ST to reassess for PO readiness on 01/01/12.  Completion of objective evaluation of MBS to be determined.     Aspiration Risk  Severe    Diet Recommendation NPO   Medication Administration: Via alternative means    Other  Recommendations     Follow Up Recommendations  Inpatient Rehab    Frequency and Duration min 2x/week  2 weeks       SLP Swallow Goals Goal #3: Patient will consume PO trials of various consistencies administered by SLP only with no observed clinical s/s of aspiration with max assist.    Swallow Study Prior Functional Status   Unknown     General Date of Onset: 12/30/11 HPI: 56 y/o male admitted for with weakness and slurring of speech. PMH significant for prior stroke with residual right-sided deficits. Patient referred for BSE per stroke protocol.  Type of Study: Bedside swallow evaluation Previous Swallow Assessment: No reports in EPIC patient treated for prior stroke in hospital in [place]. No family members present to confirm prior evaluations.  Diet Prior to this Study: NPO Temperature Spikes Noted: No Respiratory Status: Room  air History of Recent Intubation: No Behavior/Cognition: Cooperative;Distractible;Requires cueing;Alert Oral Cavity - Dentition: Adequate natural dentition Self-Feeding Abilities: Total assist Patient Positioning: Upright in bed Baseline Vocal Quality: Low vocal intensity;Other (comment) (dysarthric) Volitional Cough: Strong Volitional Swallow: Able to elicit    Oral/Motor/Sensory Function Overall Oral Motor/Sensory Function: Impaired Labial ROM: Reduced left Labial Symmetry: Abnormal symmetry left Labial Strength: Reduced Labial Sensation: Reduced Lingual ROM: Reduced left Lingual Symmetry: Abnormal symmetry left Lingual Strength: Reduced Lingual Sensation: Reduced Facial ROM: Reduced left Facial Symmetry: Left droop Facial Strength: Reduced Facial Sensation: Reduced   Ice Chips Ice chips: Impaired Oral Phase Impairments: Poor awareness of bolus Oral Phase Functional Implications: Prolonged oral transit Pharyngeal Phase Impairments: Decreased hyoid-laryngeal movement;Throat Clearing - Delayed   Thin Liquid Thin Liquid: Impaired Presentation: Spoon Pharyngeal  Phase Impairments: Decreased hyoid-laryngeal movement;Suspected delayed Swallow;Cough - Immediate    Nectar Thick Nectar Thick Liquid: Not tested   Honey Thick Honey Thick Liquid: Not tested   Puree Puree: Not tested   Solid   GO  Moreen Fowler MS, CCC-SLP [phone] Solid: Not tested       [facility] 12/31/2011,7:12 PM

## 2011-12-31 NOTE — Progress Notes (Signed)
Collin Diaz is a 56 y/o RHAAM  had a recent ischemic stroke on the left side with some right sided weakness since July 2012, was treated at [facility].   Patient has a history of poorly controlled hypertension and has stopped taking medication, Patient had headaches all day and had not taken his BP medications. At 6:45 pm 12/30/2011, patient had acute onset of left hemiplegia and was unable to talk, EMS was called and brought to the ED.    When EMS saw him his BP was 187/127 and upon arrival the the ED, the monitor displayed 254/143. He is now on Cardene drip, and 3% saline at 55 cc/hour.      Past Medical History   Diagnosis  Date   .  Hypertension     .  Stroke     Gout  Sleep Apnea   No past surgical history on file.   No family history on file. Social History:  reports that he has never smoked. He does not have any smokeless tobacco history on file. He reports that he drinks alcohol. He reports that he does not use illicit drugs.  He has passive exposure to smoking   Allergies:   Allergies   Allergen  Reactions   .  Nsaids         Unknown      ROS: Unable to obtain   Physical Examination: Blood pressure 157/98, HR 82  General Examination: HEENT-  Normocephalic, no lesions, without obvious abnormality.   Neck supple with no masses, nodes, nodules or enlargement. Cardiovascular - IRR, no murmurs, rubs and or gallops Lungs - CTA Abdomen -  Obese,  Soft, non tender. Extremities -  No edema Skin; No rash Neuro: Mental Status: drowsy, follow simple command, did not talk. Cranial Nerves Pupils reactive, conjugate eye movement, left visual field deficit, mild left lower face weakness, Motor: Right side UE: 4+/5 Right LE:  5-/5 Left UE:  0/5 Left LE:   0/5 p  DTR: right side 1, left absent.   Laboratory Studies:    Basic Metabolic Panel:   Lab  12/30/11 2008   NA  142   K  4.5   CL  111   CO2  --   GLUCOSE  90   BUN  46*   CREATININE  2.20*       CBC: WBC  --   NEUTROABS  --   HGB  15.0   HCT  44.0   MCV  --   PLT  --    Lipid Panel:         CHOL  238*  02/07/2007 2120     TRIG  99  02/07/2007 2120     HDL  86  02/07/2007 2120     CHOLHDL  2.8 Ratio  02/07/2007 2120     VLDL  20  02/07/2007 2120     LDLCALC  132*  02/07/2007 2120     EKG: normal EKG, normal sinus rhythm, unchanged from previous tracings.   Imaging: Ct Head Wo Contrast 1.8 x 2.1 cm right thalamic/basal ganglia hemorrhage with right intraventricular extension and 3 mm right to left midline shift. Mild fullness of the left lateral ventricle  identified.  Chronic small vessel white matter ischemic changes and remote left basal ganglia infarct.    Assessment: 56 y.o. male with  Left hemiplegia and speech impairment and hypertensive crisis with right side thalamus and basal ganglion hemorrhage.      1. Continue  carden drip. 3% saline. 2. Speech evaluation, PT/OT. 3. If pass swallowing, will add on po anti-BP treatment. 4. I have discussed with his wife.

## 2011-12-31 NOTE — Progress Notes (Signed)
Chaplain responded to ED after receiving a pager message from the secretary. Patient has just had a stroke and was rushed to [facility] ED. Chaplain spoke to patient's wife and provided spiritual support to family member. Patient was admitted to 3101. Chaplain later visited patient with the wife and prayed upon request by the family member. Chaplain asked family member to call for a chaplain anytime they need one. Chaplain will continue to provided spiritual support and care to both patient and family members as needed at later time.

## 2011-12-31 NOTE — Progress Notes (Deleted)
Prayed with wife and niece by the patients bed.  Had conversation with the family.  Selinda Michaels [phone]

## 2011-12-31 NOTE — H&P (Signed)
Name: Collin Diaz MRN: [id number] DOB: Apr 21, 1955    LOS: 1  PULMONARY / CRITICAL CARE MEDICINE  HPI:  [name] is a 56 year old gentleman with a past medical history significant for HTN and previous stroke, leaving him with right-sided deficits, who presented 9/14 with weakness and slurring of speech. He underwent CT scan which demonstrated a basal ganglia hemorrhage. We were consulted for admission and to be primary service on admit date of 12/30/2011   Events Since Admission: None  Subjective/ Overnight: No acute change overnight.  Expressive aphasia but nods yes/no. Remains on 3% NaCl.   Staff rounds: watching TV  Vital Signs: Temp:  [97.9 F (36.6 C)-99.2 F (37.3 C)] 99.2 F (37.3 C) (09/15 1117) Pulse Rate:  [80-96] 95  (09/15 0700) Resp:  [10-30] 14  (09/15 0700) BP: (111-254)/(70-145) 141/95 mmHg (09/15 0700) SpO2:  [96 %-100 %] 98 % (09/15 0700) Weight:  [237 lb 14 oz (107.9 kg)-244 lb 7.8 oz (110.9 kg)] 244 lb 7.8 oz (110.9 kg) (09/15 0400)  Physical Examination: General:  No acute distress Neuro:  Awake, alert, nods appropriately, expressive aphasia HEENT:  PERRL, pink conjunctivae, moist membranes Neck:  Supple, no JVD   Cardiovascular:  RRR, no M/R/G Lungs:  Bilateral diminished air entry, no W/R/R Abdomen:  Soft, nontender, nondistended, bowel sounds present Musculoskeletal:  Moves all extremities, no pedal edema Skin:  No rash   ASSESSMENT AND PLAN Principal Problem:  *Unspecified intracranial hemorrhage Active Problems:  HYPERTENSION  KIDNEY DISEASE, CHRONIC, STAGE V  NEUROLOGIC  A:  Intracranial hemorrhage P:   Per neurosurgery  Cont hypertonic saline.  Should mental status decline or he become hypoxic will have to proceed with intubation.  Cardiology:   A: HTN P:  cardene gtt off at this time  PRN labetalol  Monitor for need to resume cardene   RENAL  Lab 12/31/11 0550 12/31/11 0023 12/30/11 2015 12/30/11 2008  NA 143 142 141  142  K 4.4 4.2 -- --  CL 110 108 107 111  CO2 25 24 21  --  BUN 36* 38* 39* 46*  CREATININE 2.18* 2.14* 2.17* 2.20*  CALCIUM 9.3 9.6 10.1 --  MG 1.9 -- -- --  PHOS 2.8 -- -- --   Intake/Output      09/14 0701 - 09/15 0700 09/15 0701 - 09/16 0700   I.V. (mL/kg) 895.6 (8.1)    Total Intake(mL/kg) 895.6 (8.1)    Urine (mL/kg/hr) 1000 (0.4)    Total Output 1000    Net -104.4          Foley:  None  A:  Chronic kidney disease stage V -- good UOP P:   Will follow urine output and evaluate for any changes in status.  ENDOCRINE  Lab 12/30/11 2019  GLUCAP 82   A:  Tight glucose control   P:   Tight glucose control in the setting of intracranial hemorrhage.  Cont monitor need for SSI    BEST PRACTICE / DISPOSITION - Level of Care:  ICU - Primary Service:  PCCM - Consultants:  Neurology, neurosurgery - Code Status:  Full - Diet:  N.p.o. - pending swallow study - DVT Px:  SCDs - GI Px:  Protonix - Skin Integrity:  Good - Social / Family:  Family updated at bedside  [facility], NP 12/31/2011  1:30 PM Pager: [phone] or [phone]  *Care during the described time interval was provided by me and/or other providers on the critical care team. I have reviewed  this patient's available data, including medical history, events of note, physical examination and test results as part of my evaluation.   Dr. Kalman Shan, M.D., [facility].C.P Pulmonary and Critical Care Medicine Staff Physician [facility] Pager: [phone], If no answer or between  15:00h - 7:00h: call [phone]  12/31/2011 4:00 PM

## 2012-01-01 LAB — BASIC METABOLIC PANEL
CO2: 23 mEq/L (ref 19–32)
Calcium: 10.2 mg/dL (ref 8.4–10.5)
Calcium: 9.9 mg/dL (ref 8.4–10.5)
Chloride: 121 mEq/L — ABNORMAL HIGH (ref 96–112)
Chloride: 119 mEq/L — ABNORMAL HIGH (ref 96–112)
GFR calc Af Amer: 40 mL/min — ABNORMAL LOW (ref >90)
GFR calc Af Amer: 39 mL/min — ABNORMAL LOW (ref >90)
GFR calc non Af Amer: 35 mL/min — ABNORMAL LOW (ref >90)
GFR calc non Af Amer: 36 mL/min — ABNORMAL LOW (ref >90)
Potassium: 4 mEq/L (ref 3.5–5.1)
Potassium: 4.3 mEq/L (ref 3.5–5.1)
Sodium: 150 mEq/L — ABNORMAL HIGH (ref 135–145)
Sodium: 151 mEq/L — ABNORMAL HIGH (ref 135–145)
Sodium: 155 mEq/L — ABNORMAL HIGH (ref 135–145)
Sodium: 155 mEq/L — ABNORMAL HIGH (ref 135–145)

## 2012-01-01 LAB — LIPID PANEL
Cholesterol: 189 mg/dL (ref 0–200)
Total CHOL/HDL Ratio: 3.1 RATIO
VLDL: 18 mg/dL (ref 0–40)

## 2012-01-01 LAB — HEMOGLOBIN A1C
Hgb A1c MFr Bld: 6.3 % — ABNORMAL HIGH (ref <5.7)
Mean Plasma Glucose: 134 mg/dL — ABNORMAL HIGH (ref <117)

## 2012-01-01 LAB — SODIUM: Sodium: 154 mEq/L — ABNORMAL HIGH (ref 135–145)

## 2012-01-01 MED ORDER — ACETAMINOPHEN 325 MG PO TABS: 650.0000 mg | ORAL_TABLET | ORAL | Status: DC | PRN

## 2012-01-01 MED ORDER — LISINOPRIL-HYDROCHLOROTHIAZIDE 20-25 MG PO TABS: 1.0000 | ORAL_TABLET | Freq: Every day | ORAL | Status: DC

## 2012-01-01 MED ORDER — LISINOPRIL 20 MG PO TABS
20.0000 mg | ORAL_TABLET | Freq: Every day | ORAL | Status: DC
  Administered 2012-01-01 – 2012-01-03 (×3): 20 mg via ORAL
  Filled 2012-01-01 (×3): qty 1

## 2012-01-01 MED ORDER — HYDROCHLOROTHIAZIDE 25 MG PO TABS
25.0000 mg | ORAL_TABLET | Freq: Every day | ORAL | Status: DC
  Administered 2012-01-01 – 2012-01-03 (×3): 25 mg via ORAL
  Filled 2012-01-01 (×3): qty 1

## 2012-01-01 MED ORDER — ONDANSETRON HCL 4 MG/2ML IJ SOLN: 4.0000 mg | Freq: Four times a day (QID) | INTRAMUSCULAR | Status: DC | PRN

## 2012-01-01 MED ORDER — DOXAZOSIN MESYLATE 4 MG PO TABS
4.0000 mg | ORAL_TABLET | Freq: Every day | ORAL | Status: DC
  Administered 2012-01-01 – 2012-01-02 (×2): 4 mg via ORAL
  Filled 2012-01-01 (×4): qty 1

## 2012-01-01 MED ORDER — SENNOSIDES-DOCUSATE SODIUM 8.6-50 MG PO TABS
1.0000 | ORAL_TABLET | Freq: Two times a day (BID) | ORAL | Status: DC
  Administered 2012-01-01 – 2012-01-03 (×4): 1 via ORAL
  Filled 2012-01-01 (×6): qty 1

## 2012-01-01 MED ORDER — LABETALOL HCL 5 MG/ML IV SOLN: 10.0000 mg | INTRAVENOUS | Status: DC | PRN

## 2012-01-01 NOTE — Progress Notes (Signed)
Have collaborated on this patient and note.   01/01/2012  [place] Bing, PT [phone] (pager)

## 2012-01-01 NOTE — Progress Notes (Signed)
OT/PT Cancellation Note  Treatment cancelled today due to review of chart says PT/OT to start tomorrow. Nurse clarified this with MD and this is indeed what was intended, will initiate eval 01/02/2012.Marland Kitchen  Evette Georges [phone] 01/01/2012, 12:12 PM

## 2012-01-01 NOTE — Progress Notes (Signed)
Stroke Team Progress Note  HISTORY This is a 56 y/o who had a recent ischemic stroke on the left side with some right sided weakness. Patient has a history of poorly controlled hypertension and has stopped taking medication. Patient had headaches all day and had not taken his BP medications. At 6:45 pm 12/30/2011 patient had acute onset of left hemiplegia and was unable to talk, EMS was called and brought to the ED. When EMS saw him his BP was 187/127 and upon arrival the the ED, the monitor displayed 254/143. Cardene drip was ordered but until then patient received 20 mg of Labetalol. Patient had recent ischemic stroke on 10/21/2011 on the left MCA While he was in [place], [place]. However, he does have some residual weakness on the right side. Patient seen at the bedside, he follows commands and able to pronounce few words. NIH scale of 11 Patient has been breathing very shallow and coughing and choking. Patient is a full code. Patient was not a TPA candidate secondary to hemorrhage. He was admitted to the neuro ICU for further evaluation and treatment. Pt had small LMCA infarct July 2012 in [place] and made good functional recovery and was independent in his ADLs SUBJECTIVE His wife is at the bedside.  Overall he feels his condition is unchanged. Bp remains controlled on cardene drip and sodium is 151 this morning on 3% drip at 55cc/hr.CT head y`day showed stable rt BG ICH with 3 mm shift and mild IVH Patient had a small LMCA infarct in July 2012 in [place] with good functional recovery and was independent in his ADLs. OBJECTIVE Most recent Vital Signs: Filed Vitals:   01/01/12 0530 01/01/12 0600 01/01/12 0630 01/01/12 0700  BP: 156/89 149/81 149/101 149/92  Pulse: 108 110 113 109  Temp:    99 F (37.2 C)  TempSrc:    Oral  Resp: 17 19 18 19   Height:      Weight:      SpO2:  96%  96%   CBG (last 3)   Basename 12/30/11 2019  GLUCAP 82   Intake/Output from previous  day: 09/15 0701 - 09/16 0700 In: 2550 [I.V.:2550] Out: 4425 [Urine:4425]  IV Fluid Intake:     . niCARDipine 5 mg/hr (01/01/12 0700)  . sodium chloride (hypertonic) 55 mL/hr at 01/01/12 0700    MEDICATIONS    . antiseptic oral rinse  15 mL Mouth Rinse BID  . antiseptic oral rinse  15 mL Mouth Rinse q12n4p  . chlorhexidine  15 mL Mouth Rinse BID  . influenza  inactive virus vaccine  0.5 mL Intramuscular Tomorrow-1000  . pantoprazole (PROTONIX) IV  40 mg Intravenous Q24H  . white petrolatum       PRN:  sodium chloride, acetaminophen  Diet:  NPO Activity:  Bedrest DVT Prophylaxis:  SCDs   CLINICALLY SIGNIFICANT STUDIES Basic Metabolic Panel:  Lab 01/01/12 7829 01/01/12 0025 12/31/11 0550  NA 151* 150* --  K 4.0 4.2 --  CL 116* 115* --  CO2 24 24 --  GLUCOSE 144* 147* --  BUN 26* 27* --  CREATININE 2.05* 2.00* --  CALCIUM 9.9 9.9 --  MG -- -- 1.9  PHOS -- -- 2.8   Liver Function Tests:  Lab 12/30/11 2015  AST 22  ALT 27  ALKPHOS 99  BILITOT 0.3  PROT 7.9  ALBUMIN 4.3   CBC:  Lab 12/31/11 0550 12/30/11 2015  WBC 5.2 6.1  NEUTROABS -- 2.6  HGB 12.1* 14.1  HCT 36.0* 40.8  MCV 88.5 88.3  PLT 127* 125*   Coagulation:  Lab 12/30/11 2015  LABPROT 12.7  INR 0.93   Cardiac Enzymes:  Lab 12/31/11 0023  CKTOTAL --  CKMB --  CKMBINDEX --  TROPONINI <0.30   Urinalysis: No results found for this basename: COLORURINE:2,APPERANCEUR:2,LABSPEC:2,PHURINE:2,GLUCOSEU:2,HGBUR:2,BILIRUBINUR:2,KETONESUR:2,PROTEINUR:2,UROBILINOGEN:2,NITRITE:2,LEUKOCYTESUR:2 in the last 168 hours Lipid Panel    Component Value Date/Time   CHOL 238* 02/07/2007 2120   TRIG 99 02/07/2007 2120   HDL 86 02/07/2007 2120   CHOLHDL 2.8 Ratio 02/07/2007 2120   VLDL 20 02/07/2007 2120   LDLCALC 132* 02/07/2007 2120   HgbA1C  No results found for this basename: HGBA1C   CT of the brain   12/31/2011  No increase in size of the right basal ganglia intraparenchymal hematoma.  No change and 3 mm  right-to-left shift.  Small amount of intraventricular blood, layering in both occipital horns presently.   12/30/2011   1.8 x 2.1 cm right thalamic/basal ganglia hemorrhage with right intraventricular extension and 3 mm right to left midline shift. Mild fullness of the left lateral ventricle  identified.  Chronic small vessel white matter ischemic changes and remote left basal ganglia infarct.     MRI of the brain    MRA of the brain    2D Echocardiogram    Carotid Doppler    CXR  12/30/2011  Prominent cardiomediastinal contours.  Mild interstitial prominence is nonspecific. May reflect atypical infection or edema.  No focal consolidation.  Left subclavian catheter tip projects over the brachiocephalic/SVC confluence.  Recommend lateral follow-up to exclude extension into the azygos vein.   EKG  normal sinus rhythm.   Therapy Recommendations PT - ; OT - ; ST - NPO  Physical Exam   Middle aged african [place] male not in distress.Awake alert. Afebrile. Head is nontraumatic. Neck is supple without bruit. Hearing is normal. Cardiac exam no murmur or gallop. Lungs are clear to auscultation. Distal pulses are well felt.  Neurological Exam :  Awake alert expressive aphasia with severe dysarthria and can barely be understood.follows 2 step commands. Rt gaze preference but can look to left past midline. Decrease blink to threat but can count fingers well on both sides. Left lower face weak. Tongue deviats to left. Weak cough and gag. Dense lefthemiplegia 1/5 with increase tone. Good antigravity strength on right and normal sensation and coordination on right and impaired on left.Plantars leftupgoing and right down going. ASSESSMENT Collin Diaz is a 56 y.o. male presenting with left hemiplegia and speech impairment in setting of malignant hypertension with BP 254/143 on presentation.  Imaging confirms a right side thalamus and basal ganglia hemorrhage with intraventricular hemorrhage and  cytotoxic cerebral edema and 3 mm right to left midline transfalcine herniation   Patient with resultant left hemiplegia, dysphagia and dysarthria from hemorrhage. 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 He had a mRS of 1 PTA.   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 BP goal below 180 systolic  Add 3% protocol orders with Na goal 150-155  CT head in am  Keep in bed today  Ok to recheck swallow  Check lipids and A1c -D/W Dr Craige Cotta PCCM and wife and answered questions. This patient is critically ill and at  significant risk of neurological worsening, death and care requires constant monitoring of vital signs, hemodynamics,respiratory and cardiac monitoring,review of multiple databases, neurological assessment, discussion with family, other specialists and medical decision making of high complexity. I spent 30 minutes of neurocritical care time  in the care of  this patient.  Annie Main, MSN, RN, ANVP-BC, ANP-BC, [facility] Pager: [phone] 01/01/2012 8:03 AM  Scribe for Dr. Delia Heady, Stroke Center Medical Director, who has personally reviewed chart, pertinent data, examined the patient and developed the plan of care. Pager:  [phone]

## 2012-01-01 NOTE — Progress Notes (Signed)
SLP has reviewed and agrees with student's note below.  Byrd Terrero Willis Jaylise Peek M.Ed CCC-SLP Pager [phone]  01/01/2012  

## 2012-01-01 NOTE — Progress Notes (Signed)
Name: Collin Diaz MRN: [id number] DOB: 1955-09-27    LOS: 2  PULMONARY / CRITICAL CARE MEDICINE  HPI:  56 yo male admitted on 12/30/2011 with HTN crisis, HA, slurred speech and weakness 2nd to basal ganglia hemorrhage.  PCCM asked to admit to ICU.  Compliance with medical therapy has been an issue. PMHx HTN, CVA, Stage IV CKD  Events Since Admission: 9//14 Start 3% NS  Subjective: No significant events overnight.   Vital Signs: Temp:  [98.3 F (36.8 C)-99.4 F (37.4 C)] 99 F (37.2 C) (09/16 0700) Pulse Rate:  [84-119] 104  (09/16 0800) Resp:  [9-23] 10  (09/16 0800) BP: (136-185)/(76-107) 150/95 mmHg (09/16 0800) SpO2:  [95 %-99 %] 95 % (09/16 0800) Weight:  [230 lb 6.1 oz (104.5 kg)] 230 lb 6.1 oz (104.5 kg) (09/16 0500)  Physical Examination: General:  No acute distress Neuro:  Awake, alert, nods appropriately, expressive aphasia HEENT:  PERRL, pink conjunctivae, moist membranes Neck:  Supple, no JVD   Cardiovascular:  RRR, no M/R/G Lungs:  Bilateral diminished air entry, no W/R/R Abdomen:  Soft, nontender, nondistended, bowel sounds present Musculoskeletal:  Moves all extremities, no pedal edema Skin:  No rash   Ct Head Wo Contrast  12/31/2011  *RADIOLOGY REPORT*  Clinical Data: Follow-up intracranial hemorrhage  CT HEAD WITHOUT CONTRAST  Technique:  Contiguous axial images were obtained from the base of the skull through the vertex without contrast.  Comparison: 09/14  Findings: There is no increase in size of the intraparenchymal hematoma within the right basal ganglia/posterior limb internal capsule region, again measured at 22 x 17 mm.  Interventricular penetration remains evident with some blood dependent within the occipital horns.  Ventricular size is stable with the left ventricle asymmetrically larger than the right.  Diffuse small vessel disease throughout the hemispheric white matter remains the same with old infarction in the white matter tracts on the  left. No subarachnoid hemorrhage.  No extra-axial collection.  Right-to- left shift of approximately 3 mm persists.  IMPRESSION: No increase in size of the right basal ganglia intraparenchymal hematoma.  No change and 3 mm right-to-left shift.  Small amount of intraventricular blood, layering in both occipital horns presently.   Original Report Authenticated By: Thomasenia Sales, M.D.    Ct Head Wo Contrast  12/30/2011  *RADIOLOGY REPORT*  Clinical Data: Acute left-sided weakness and aphasia.  Code stroke.  CT HEAD WITHOUT CONTRAST  Technique:  Contiguous axial images were obtained from the base of the skull through the vertex without contrast.  Comparison: None  Findings: A 1.8 x 2.1 cm right thalamic/basal ganglia hemorrhage is noted with extension into the right lateral ventricle.  3 mm right to left midline shift is identified with mild fullness of the left lateral ventricle.  Chronic small vessel white matter ischemic changes are noted as well as a remote left basil ganglia infarct. The basilar cisterns are patent.  No acute or suspicious bony abnormalities are identified.  IMPRESSION: 1.8 x 2.1 cm right thalamic/basal ganglia hemorrhage with right intraventricular extension and 3 mm right to left midline shift. Mild fullness of the left lateral ventricle  identified.  Chronic small vessel white matter ischemic changes and remote left basal ganglia infarct.  Critical Value/emergent results were called by telephone at the time of interpretation on 12/30/2011 at 8:05 p.m. to Dr. Ethelda Chick, who verbally acknowledged these results.   Original Report Authenticated By: Rosendo Gros, M.D.    Dg Chest Portable 1 View  12/30/2011  *  RADIOLOGY REPORT*  Clinical Data: Code stroke, hypertension.  PORTABLE CHEST - 1 VIEW  Comparison: 09/27/2006  Findings: Left subclavian central venous catheter tip projects over the brachiocephalic/SVC confluence.  Prominent cardiomediastinal contours.  Interstitial prominence.  No  confluent airspace opacity. No pneumothorax.  No acute osseous change.  Multilevel degenerative changes.  IMPRESSION: Prominent cardiomediastinal contours.  Mild interstitial prominence is nonspecific. May reflect atypical infection or edema.  No focal consolidation.  Left subclavian catheter tip projects over the brachiocephalic/SVC confluence.  Recommend lateral follow-up to exclude extension into the azygos vein.   Original Report Authenticated By: Waneta Martins, M.D.      ASSESSMENT AND PLAN  NEUROLOGIC  A:  Intracranial hemorrhage likely from HTN crisis..  No indication for neurosurgical intervention. P:   BP goals and 3% NS per neurology Will need PT/OT  Cardiology:   A: HTN P:  Continue cardene gtt per neurology PRN labetalol   RENAL  Lab 01/01/12 0525 01/01/12 0025 12/31/11 1800 12/31/11 1223 12/31/11 0550  NA 151* 150* 149* 147* 143  K 4.0 4.2 -- -- --  CL 116* 115* 113* 113* 110  CO2 24 24 24 23 25   BUN 26* 27* 29* 31* 36*  CREATININE 2.05* 2.00* 2.01* 2.05* 2.18*  CALCIUM 9.9 9.9 9.8 9.7 9.3  MG -- -- -- -- 1.9  PHOS -- -- -- -- 2.8   Intake/Output      09/15 0701 - 09/16 0700 09/16 0701 - 09/17 0700   I.V. (mL/kg) 2550 (24.4) 125 (1.2)   Total Intake(mL/kg) 2550 (24.4) 125 (1.2)   Urine (mL/kg/hr) 4425 (1.8) 350   Total Output 4425 350   Net -1875 -225         Foley:  None  A:  Chronic kidney disease stage IV -- good UOP Hypernatremia P:   Monitor renal fx, urine outpt F/U Na and adjust 3% NS per protocol  ENDOCRINE  Lab 12/30/11 2019  GLUCAP 82   A:  Tight glucose control   P:   Tight glucose control in the setting of intracranial hemorrhage.  Cont monitor need for SSI  GASTROENTEROLOGY  A: Dysphagia.  Seen by speech 9/15. P: Continue NPO May need NG tube >> defer to neurology    BEST PRACTICE / DISPOSITION - Level of Care:  ICU - Primary Service:  Neurology - Consultants: None - Code Status:  Full - Diet:  N.p.o.  - DVT Px:   SCDs - GI Px:  Protonix  Discussed with Dr. Pearlean Brownie.  Will change primary service to Dr. Pearlean Brownie and stroke team.  PCCM can be available as needed to assist with medical management.  Coralyn Helling, MD [facility] Pulmonary/Critical Care 01/01/2012, 8:47 AM Pager:  [phone] After 3pm call: [phone]

## 2012-01-01 NOTE — Progress Notes (Signed)
Chaplain responded to a pager message and attended to patient's family member in [facility] interfaith [place]. Chaplin shared words of encouragement and comfort with family member. Chaplain prayed with family member and will continue to provide spiritual care and support to both patient and family as needed at a later time.

## 2012-01-01 NOTE — Progress Notes (Signed)
Speech Language Pathology Dysphagia Treatment Patient Details Name: Collin Diaz MRN: [id number] DOB: May 21, 1955 Today's Date: 01/01/2012 Time: 4098-1191 SLP Time Calculation (min): 25 min  Assessment / Plan / Recommendation Clinical Impression  Pt. seen for dysphagia treatment focusing on appropriateness to initiate POs. Max verbal/visual/tactile cues required for Pt. to achieve a wide oral cavity to accept bolus and to transit/ initiate the swallow as well as total assist for feeding. Anterior loss of bolus observed with thin liquids due to reduced labial seal. Immediate weak throat clear and subltle cough exhibited in addition to slightly increased work of breath indicative of pharyngeal dysphagia. SLP recommends continue NPO objective study (FEES) can determine swallow safety function.      Diet Recommendation  Continue with Current Diet: NPO    SLP Plan FEES      Swallowing Goals  SLP Swallowing Goals Goal #3: Patient will consume PO trials of various consistencies administered by SLP only with no observed clinical s/s of aspiration with max assist.  Swallow Study Goal #3 - Progress: Progressing toward goal  General Temperature Spikes Noted: No Respiratory Status: Room air Behavior/Cognition: Cooperative;Alert;Requires cueing Oral Cavity - Dentition: Adequate natural dentition Patient Positioning: Upright in bed  Oral Cavity - Oral Hygiene Does patient have any of the following "at risk" factors?: Nutritional status - inadequate Brush patient's teeth BID with toothbrush (using toothpaste with fluoride): Yes Patient is HIGH RISK - Oral Care Protocol followed (see row info): Yes Patient is AT RISK - Oral Care Protocol followed (see row info): Yes   Dysphagia Treatment Treatment focused on: Upgraded PO texture trials;Patient/family/caregiver education Treatment Methods/Modalities: Skilled observation Patient observed directly with PO's: Yes Type of PO's observed: Dysphagia  1 (puree);Thin liquids;Nectar-thick liquids;Honey-thick liquids;Ice chips Feeding: Total assist Liquids provided via: Cup;No straw Oral Phase Signs & Symptoms: Anterior loss/spillage;Prolonged bolus formation (oral holding) Pharyngeal Phase Signs & Symptoms: Suspected delayed swallow initiation;Multiple swallows;Immediate throat clear;Immediate cough;Changes in respirations Type of cueing: Verbal;Tactile;Visual Amount of cueing: Maximal   GO     Theotis Burrow 01/01/2012, 12:35 PM

## 2012-01-01 NOTE — Procedures (Signed)
Objective Swallowing Evaluation: Fiberoptic Endoscopic Evaluation of Swallowing  Patient Details  Name: Collin Diaz MRN: [id number] Date of Birth: [birth year]  Today's Date: 01/01/2012 Time: 4098-1191 SLP Time Calculation (min): 40 min  Past Medical History:  Past Medical History  Diagnosis Date  . Hypertension   . Stroke    Past Surgical History: History reviewed. No pertinent past surgical history. HPI:  56 y/o male admitted for with weakness and slurring of speech. PMH significant for prior stroke with residual right-sided deficits. CT revealed right basal ganglia intraparenchymal.  FEES recommended after BSE.       Assessment / Plan / Recommendation Clinical Impression  Dysphagia Diagnosis: Mild oral phase dysphagia;Moderate pharyngeal phase dysphagia Clinical impression: Pt. exhibited mild oral dysphagia with decreased manipulation leading to slow transit.  Mild-moderate pharyngeal dysphagia is characterized by decreased pharyngeal sensation resulting in penetration to the vocal cords with honey thick (cup) without sensation.  Swallow was initiated at the level of the valleculae and pyriform sinsus.  Intermittent and nonsignificant residue in the pyriform sinuses.  SLP recommends Dys 1 diet only (pudding thick liquids).         Treatment Recommendation  Therapy as outlined in treatment plan below    Diet Recommendation Dysphagia 1 (Puree);No liquids   Medication Administration: Crushed with puree Supervision: Full supervision/cueing for compensatory strategies Compensations: Slow rate;Small sips/bites;Check for pocketing Postural Changes and/or Swallow Maneuvers: Seated upright 90 degrees;Upright 30-60 min after meal    Other  Recommendations Oral Care Recommendations: Oral care BID Other Recommendations: Prohibited food (jello, ice cream, thin soups)   Follow Up Recommendations  Inpatient Rehab    Frequency and Duration min 2x/week  2 weeks       SLP Swallow  Goals Patient will consume recommended diet without observed clinical signs of aspiration with: Minimal cueing Patient will utilize recommended strategies during swallow to increase swallowing safety with: Minimal cueing Goal #3: Patient will consume PO trials of various consistencies administered by SLP only with no observed clinical s/s of aspiration with max assist.  Swallow Study Goal #3 - Progress: Not met   General HPI: 56 y/o male admitted for with weakness and slurring of speech. PMH significant for prior stroke with residual right-sided deficits. CT revealed right basal ganglia intraparenchymal.  FEES recommended after BSE.   Type of Study: Fiberoptic Endoscopic Evaluation of Swallowing Reason for Referral: Objectively evaluate swallowing function Diet Prior to this Study: NPO Temperature Spikes Noted: No Respiratory Status: Room air History of Recent Intubation: No Behavior/Cognition: Alert;Cooperative;Requires cueing;Decreased sustained attention Oral Cavity - Dentition: Adequate natural dentition Oral Motor / Sensory Function: Impaired - see Bedside swallow eval Self-Feeding Abilities: Needs assist Patient Positioning: Upright in bed Baseline Vocal Quality: Low vocal intensity;Other (comment) Volitional Cough: Weak Anatomy:  (arytenoid cartilige appeared edematous) Pharyngeal Secretions: Normal    Reason for Referral Objectively evaluate swallowing function   Oral Phase Oral Preparation/Oral Phase Oral Phase: Impaired Oral - Honey Oral - Honey Teaspoon: Weak lingual manipulation;Delayed oral transit Oral - Honey Cup: Delayed oral transit;Weak lingual manipulation Oral - Solids Oral - Puree: Delayed oral transit;Weak lingual manipulation   Pharyngeal Phase Pharyngeal Phase Pharyngeal Phase: Impaired Pharyngeal - Honey Pharyngeal - Honey Teaspoon: Delayed swallow initiation;Premature spillage to valleculae;Premature spillage to pyriform sinuses Pharyngeal - Honey Cup:  Delayed swallow initiation;Premature spillage to pyriform sinuses;Penetration/Aspiration before swallow Penetration/Aspiration details (honey cup): Material enters airway, CONTACTS cords and not ejected out Pharyngeal - Solids Pharyngeal - Puree: Delayed swallow initiation;Premature spillage to valleculae  Cervical Esophageal Phase   Breck Coons Damonta Cossey M.Ed [employer] [phone]  01/01/2012

## 2012-01-02 ENCOUNTER — Inpatient Hospital Stay (HOSPITAL_COMMUNITY): Payer: Medicaid Other

## 2012-01-02 DIAGNOSIS — I619 Nontraumatic intracerebral hemorrhage, unspecified: Secondary | ICD-10-CM

## 2012-01-02 LAB — BASIC METABOLIC PANEL
CO2: 24 mEq/L (ref 19–32)
Calcium: 9.7 mg/dL (ref 8.4–10.5)
Creatinine, Ser: 2.6 mg/dL — ABNORMAL HIGH (ref 0.50–1.35)
GFR calc non Af Amer: 26 mL/min — ABNORMAL LOW (ref >90)

## 2012-01-02 LAB — SODIUM: Sodium: 155 mEq/L — ABNORMAL HIGH (ref 135–145)

## 2012-01-02 MED ORDER — PANTOPRAZOLE SODIUM 40 MG IV SOLR: 40.0000 mg | INTRAVENOUS | Status: DC

## 2012-01-02 MED ORDER — ARIPIPRAZOLE 2 MG PO TABS
2.0000 mg | ORAL_TABLET | Freq: Every day | ORAL | Status: DC
  Administered 2012-01-03: 2 mg via ORAL
  Filled 2012-01-02 (×2): qty 1

## 2012-01-02 MED ORDER — STARCH (THICKENING) PO POWD
ORAL | Status: DC | PRN
Start: 2012-01-02 — End: 2012-01-03
  Filled 2012-01-02 (×2): qty 227

## 2012-01-02 NOTE — Progress Notes (Signed)
Speech Language Pathology Dysphagia Treatment Patient Details Name: Collin Diaz MRN: [id number] DOB: [birth year] Today's Date: 01/02/2012 Time: 1191-4782 SLP Time Calculation (min): 14 min  Assessment / Plan / Recommendation Clinical Impression  Pt. seen for dysphagia treatment focusing on swallow safety function with Dys 1. Diet. (pudding thick liquids).  Pt. is lethargic with decreased sustained attention butl able to follow commands with max verbal/tactile cueing. Initially, total hand over hand assist needed to transport bolus to mouth progressng toward max verbal visual/verbal/tactile cues for feeding, maintaining labial seal around spoon and intiating the swallow . With pudding/puree consistency, a subtle immediate throat clear was observed, suggestive of possible penetration. Overall, Pt. appears to be tolerating Dys. 1 consistency with decreased s/s of aspiration than during assessment. SLP recommends to continue Dys 1. With no liquids until ready for objective study to reassess swallow safety function.     Diet Recommendation  Continue with Current Diet: Dysphagia 1 (puree);Other (comment) (no liquids)    SLP Plan Continue with current plan of care      Swallowing Goals  SLP Swallowing Goals Patient will consume recommended diet without observed clinical signs of aspiration with: Moderate cueing Swallow Study Goal #1 - Progress: Progressing toward goal Patient will utilize recommended strategies during swallow to increase swallowing safety with: Moderate assistance Swallow Study Goal #2 - Progress: Progressing toward goal Goal #3: Patient will consume PO trials of various consistencies administered by SLP only with no observed clinical s/s of aspiration with max assist.  Swallow Study Goal #3 - Progress: Progressing toward goal  General Temperature Spikes Noted: No Respiratory Status: Room air Behavior/Cognition: Cooperative;Requires cueing;Decreased sustained  attention;Lethargic Oral Cavity - Dentition: Adequate natural dentition Patient Positioning: Upright in bed  Oral Cavity - Oral Hygiene Does patient have any of the following "at risk" factors?: Other - dysphagia;Nutritional status - dependent feeder;Nutritional status - inadequate Brush patient's teeth BID with toothbrush (using toothpaste with fluoride): Yes Patient is HIGH RISK - Oral Care Protocol followed (see row info): Yes Patient is AT RISK - Oral Care Protocol followed (see row info): Yes   Dysphagia Treatment Treatment focused on: Skilled observation of diet tolerance Treatment Methods/Modalities: Skilled observation Patient observed directly with PO's: Yes Type of PO's observed: Pudding-thick liquids (puree/pudding thick ) Feeding: Needs assist Liquids provided via: Teaspoon Oral Phase Signs & Symptoms:  (decreased labial seal) Pharyngeal Phase Signs & Symptoms: Immediate throat clear Type of cueing: Verbal;Tactile;Visual Amount of cueing: Maximal   GO     Asencion Loveday 01/02/2012, 10:30 AM

## 2012-01-02 NOTE — Progress Notes (Signed)
Clinical Social Work Department CLINICAL SOCIAL WORK PLACEMENT NOTE 01/02/2012  Patient:  Collin Diaz, Collin Diaz  Account Number:  [id number] Admit date:  12/30/2011  Clinical Social Worker:  Jacelyn Grip  Date/time:  01/02/2012 01:00 PM  Clinical Social Work is seeking post-discharge placement for this patient at the following level of care:   SKILLED NURSING   (*CSW will update this form in Epic as items are completed)   01/02/2012  Patient/family provided with [facility] Department of Clinical Social Work's list of facilities offering this level of care within the geographic area requested by the patient (or if unable, by the patient's family).  01/02/2012  Patient/family informed of their freedom to choose among providers that offer the needed level of care, that participate in Medicare, Medicaid or managed care program needed by the patient, have an available bed and are willing to accept the patient.  01/02/2012  Patient/family informed of [facility]' ownership interest in [facility], as well as of the fact that they are under no obligation to receive care at this facility.  PASARR submitted to EDS on 01/02/2012 PASARR number received from [employer] on 01/02/2012  FL2 transmitted to all facilities in geographic area requested by pt/family on  01/02/2012 FL2 transmitted to all facilities within larger geographic area on   Patient informed that his/her managed care company has contracts with or will negotiate with  certain facilities, including the following:     Patient/family informed of bed offers received:   Patient chooses bed at  Physician recommends and patient chooses bed at    Patient to be transferred to  on   Patient to be transferred to facility by   The following physician request were entered in Epic:   Additional Comments:   Jacklynn Lewis, MSW, LCSWA  Clinical Social Work [phone]

## 2012-01-02 NOTE — Consult Note (Signed)
Physical Medicine and Rehabilitation Consult Reason for Consult: Right basal ganglia hemorrhage Referring Physician: Dr. Pearlean Brownie   HPI: Collin Diaz is a 56 y.o. right-handed African American male history of hypertension as well as previous ischemic stroke 2012 with right-sided residual weakness. Patient independent prior to admission but not driving. Admitted 12/30/2011 with weakness and slurring of speech and blood pressure 254/143. Patient placed on Cardene drip. Cranial CT and imaging revealed right thalamus and basal ganglia hemorrhage with intraventricular hemorrhage and cytotoxic cerebral edema with right to left midline shift. Followup neurology services with tight blood pressure control. Noted dysphagia currently on a dysphagia 1 pudding thick liquid diet. Physical and occupational therapy ongoing with recommendations of physical medicine rehabilitation consult to consider inpatient rehabilitation services   Review of Systems  Unable to perform ROS All other systems reviewed and are negative.   Past Medical History  Diagnosis Date  . Hypertension   . Stroke    History reviewed. No pertinent past surgical history. History reviewed. No pertinent family history. Social History:  reports that he has never smoked. He does not have any smokeless tobacco history on file. He reports that he drinks alcohol. He reports that he does not use illicit drugs. Allergies:  Allergies  Allergen Reactions  . Nsaids     Unknown   Medications Prior to Admission  Medication Sig Dispense Refill  . allopurinol (ZYLOPRIM) 100 MG tablet Take 100 mg by mouth daily.      . ARIPiprazole (ABILIFY) 2 MG tablet Take 2 mg by mouth daily.      Marland Kitchen doxazosin (CARDURA) 4 MG tablet Take 4 mg by mouth at bedtime.      . hydrochlorothiazide (HYDRODIURIL) 25 MG tablet Take 25 mg by mouth daily.      Marland Kitchen lisinopril-hydrochlorothiazide (PRINZIDE,ZESTORETIC) 20-25 MG per tablet Take 1 tablet by mouth daily.      Marland Kitchen  NIFEdipine (PROCARDIA XL/ADALAT-CC) 60 MG 24 hr tablet Take 60 mg by mouth daily.      . STUDY MEDICATION Take 1 tablet by mouth daily. Either Febexustat, Allopurinol, or Placebo        Home: Home Living Lives With: Spouse;Other (Comment) Available Help at Discharge: Family Type of Home: Apartment Home Access: Stairs to enter Entrance Stairs-Number of Steps: 5-10 Entrance Stairs-Rails: Right;Left Home Layout: One level Bathroom Shower/Tub: Walk-in Contractor: Standard Home Adaptive Equipment: None  Functional History: Prior Function Driving: Yes Vocation: Unemployed Functional Status:  Mobility: Bed Mobility Bed Mobility: Rolling Right;Rolling Left;Left Sidelying to Sit;Sitting - Scoot to [place] of Bed;Sit to Sidelying Left Rolling Right: 1: +2 Total assist Rolling Right: Patient Percentage: 30% Rolling Left: 3: Mod assist Left Sidelying to Sit: 1: +2 Total assist;HOB flat Left Sidelying to Sit: Patient Percentage: 30% Sitting - Scoot to Edge of Bed: 2: Max assist Sit to Sidelying Left: 1: +2 Total assist;HOB flat Sit to Sidelying Left: Patient Percentage: 30% Transfers Transfers: Not assessed Ambulation/Gait Ambulation/Gait Assistance: Not tested (comment) Stairs: No Wheelchair Mobility Wheelchair Mobility: No  ADL:    Cognition: Cognition Overall Cognitive Status: Impaired Arousal/Alertness: Awake/alert Orientation Level: Oriented to person;Oriented to place;Oriented to situation Attention: Sustained Awareness: Impaired Awareness Impairment: Intellectual impairment Problem Solving: Impaired Problem Solving Impairment: Verbal complex;Functional complex Executive Function: Decision Making;Organizing;Initiating;Reasoning Reasoning: Impaired Reasoning Impairment: Verbal complex;Functional complex Sequencing: Appears intact Sequencing Impairment: Verbal complex;Functional complex Organizing: Impaired Organizing Impairment: Verbal  complex;Functional complex Decision Making: Impaired Decision Making Impairment: Verbal complex;Functional complex Initiating: Impaired Initiating Impairment: Verbal basic Safety/Judgment: Impaired  Cognition Overall Cognitive Status: Impaired Arousal/Alertness: Awake/alert Orientation Level: Oriented X4 / Intact Behavior During Session: [facility] for tasks performed  Blood pressure 129/87, pulse 109, temperature 98.8 F (37.1 C), temperature source Oral, resp. rate 25, height 5\' 9"  (1.753 m), weight 104.6 kg (230 lb 9.6 oz), SpO2 94.00%. Physical Exam  Vitals reviewed. Constitutional: He appears well-developed.  HENT:  Head: Normocephalic.  Eyes:       Pupils round and reactive to light  Neck: Normal range of motion. Neck supple. No thyromegaly present.  Cardiovascular: Normal rate and regular rhythm.   Pulmonary/Chest: Breath sounds normal. No respiratory distress. He has no wheezes.  Abdominal: Bowel sounds are normal. He exhibits no distension. There is no tenderness.  Musculoskeletal: He exhibits no edema.  Neurological: He is alert.       Patient did form a few low volume words and could follow simple commands.. He did make good eye contact with examiner. He would had nod yes no to simple questions. LUE and LLE with no volitional movement. He did sense pain but fine sense was diminished. He had resting tone of 1+. DTR's are 2++ on the left. Poor oromotor control and notable left tongue deviaion.   Skin: Skin is warm and dry.    Results for orders placed during the hospital encounter of 12/30/11 (from the past 24 hour(s))  BASIC METABOLIC PANEL     Status: Abnormal   Collection Time   01/01/12  5:05 PM      Component Value Range   Sodium 155 (*) 135 - 145 mEq/L   Potassium 4.2  3.5 - 5.1 mEq/L   Chloride 119 (*) 96 - 112 mEq/L   CO2 23  19 - 32 mEq/L   Glucose, Bld 149 (*) 70 - 99 mg/dL   BUN 25 (*) 6 - 23 mg/dL   Creatinine, Ser 1.61 (*) 0.50 - 1.35 mg/dL   Calcium 09.6  8.4  - 10.5 mg/dL   GFR calc non Af Amer 33 (*) >90 mL/min   GFR calc Af Amer 38 (*) >90 mL/min  SODIUM     Status: Abnormal   Collection Time   01/01/12  5:05 PM      Component Value Range   Sodium 154 (*) 135 - 145 mEq/L  SODIUM     Status: Abnormal   Collection Time   01/01/12 10:48 PM      Component Value Range   Sodium 154 (*) 135 - 145 mEq/L  BASIC METABOLIC PANEL     Status: Abnormal   Collection Time   01/02/12  5:00 AM      Component Value Range   Sodium 157 (*) 135 - 145 mEq/L   Potassium 4.4  3.5 - 5.1 mEq/L   Chloride 122 (*) 96 - 112 mEq/L   CO2 24  19 - 32 mEq/L   Glucose, Bld 143 (*) 70 - 99 mg/dL   BUN 30 (*) 6 - 23 mg/dL   Creatinine, Ser 0.45 (*) 0.50 - 1.35 mg/dL   Calcium 9.7  8.4 - 40.9 mg/dL   GFR calc non Af Amer 26 (*) >90 mL/min   GFR calc Af Amer 30 (*) >90 mL/min  SODIUM     Status: Abnormal   Collection Time   01/02/12 10:48 AM      Component Value Range   Sodium 155 (*) 135 - 145 mEq/L   Ct Head Wo Contrast  01/02/2012  *RADIOLOGY REPORT*  Clinical Data: Follow-up intracranial hemorrhage.  CT HEAD WITHOUT CONTRAST  Technique:  Contiguous axial images were obtained from the base of the skull through the vertex without contrast.  Comparison: 12/31/2011.  Findings: Slight interval decrease in size of the right basal ganglia hematoma.  It measures 21 x 15 mm.  Stable surrounding edema and mass effect on the lateral and third ventricles.  The interventricular hemorrhage is stable.  Underlying significant white matter disease is again demonstrated.  The CSF spaces around the brainstem are maintained.  IMPRESSION: Slight interval decrease in size of the right basal ganglia hematoma. Stable interventricular hemorrhage. Stable mass effect on the right lateral and third ventricles.   Original Report Authenticated By: P. Loralie Champagne, M.D.    Ct Head Wo Contrast  12/31/2011  *RADIOLOGY REPORT*  Clinical Data: Follow-up intracranial hemorrhage  CT HEAD WITHOUT CONTRAST   Technique:  Contiguous axial images were obtained from the base of the skull through the vertex without contrast.  Comparison: 09/14  Findings: There is no increase in size of the intraparenchymal hematoma within the right basal ganglia/posterior limb internal capsule region, again measured at 22 x 17 mm.  Interventricular penetration remains evident with some blood dependent within the occipital horns.  Ventricular size is stable with the left ventricle asymmetrically larger than the right.  Diffuse small vessel disease throughout the hemispheric white matter remains the same with old infarction in the white matter tracts on the left. No subarachnoid hemorrhage.  No extra-axial collection.  Right-to- left shift of approximately 3 mm persists.  IMPRESSION: No increase in size of the right basal ganglia intraparenchymal hematoma.  No change and 3 mm right-to-left shift.  Small amount of intraventricular blood, layering in both occipital horns presently.   Original Report Authenticated By: Thomasenia Sales, M.D.     Assessment/Plan: Diagnosis: Intracranial hemorrhage 1. Does the need for close, 24 hr/day medical supervision in concert with the patient's rehab needs make it unreasonable for this patient to be served in a less intensive setting? Yes 2. Co-Morbidities requiring supervision/potential complications: htn, kidney disease, gout 3. Due to bladder management, bowel management, safety, skin/wound care, disease management, medication administration, pain management and patient education, does the patient require 24 hr/day rehab nursing? Yes 4. Does the patient require coordinated care of a physician, rehab nurse, PT (1-2 hrs/day, 5 days/week), OT (1-2 hrs/day, 5 days/week) and SLP (1-2 hrs/day, 5 days/week) to address physical and functional deficits in the context of the above medical diagnosis(es)? Yes Addressing deficits in the following areas: balance, endurance, locomotion, strength, transferring,  bowel/bladder control, bathing, dressing, feeding, grooming, toileting, cognition, speech, language, swallowing and psychosocial support 5. Can the patient actively participate in an intensive therapy program of at least 3 hrs of therapy per day at least 5 days per week? Yes 6. The potential for patient to make measurable gains while on inpatient rehab is excellent 7. Anticipated functional outcomes upon discharge from inpatient rehab are min to mod assist with PT, min to mod assist with OT, supervision to min assist with SLP. 8. Estimated rehab length of stay to reach the above functional goals is: 3+ weeks 9. Does the patient have adequate social supports to accommodate these discharge functional goals? Yes 10. Anticipated D/C setting: Home 11. Anticipated post D/C treatments: HH therapy 12. Overall Rehab/Functional Prognosis: excellent  RECOMMENDATIONS: This patient's condition is appropriate for continued rehabilitative care in the following setting: CIR Patient has agreed to participate in recommended program. Yes Note that insurance prior authorization may be required for  reimbursement for recommended care.  Comment:Rehab RN to follow up.   Ivory Broad, MD     01/02/2012

## 2012-01-02 NOTE — Evaluation (Signed)
Physical Therapy Evaluation Patient Details Name: Collin Diaz MRN: [id number] DOB: Jun 29, 1955 Today's Date: 01/02/2012 Time: 1191-4782 PT Time Calculation (min): 42 min  PT Assessment / Plan / Recommendation Clinical Impression  pt adm with new dense L sided weakness due to R BG, thalamic infarct.  Mobility on eval limited by HP and decr coordination L and residual HP on the R from MCA infarct recently.  Will see on acute and pt could benefit from CIR    PT Assessment  Patient needs continued PT services    Follow Up Recommendations  Inpatient Rehab    Barriers to Discharge        Equipment Recommendations  Defer to next venue    Recommendations for Other Services Rehab consult   Frequency Min 3X/week    Precautions / Restrictions Precautions Precautions: Fall Restrictions Weight Bearing Restrictions: No   Pertinent Vitals/Pain 01/02/2012  [place] Bing, PT [phone] (pager)       Mobility  Bed Mobility Bed Mobility: Rolling Right;Rolling Left;Left Sidelying to Sit;Sitting - Scoot to [place] of Bed;Sit to Sidelying Left Rolling Right: 1: +2 Total assist Rolling Right: Patient Percentage: 30% Rolling Left: 3: Mod assist Left Sidelying to Sit: 1: +2 Total assist;HOB flat Left Sidelying to Sit: Patient Percentage: 30% Sitting - Scoot to Edge of Bed: 2: Max assist Sit to Sidelying Left: 1: +2 Total assist;HOB flat Sit to Sidelying Left: Patient Percentage: 30% Details for Bed Mobility Assistance: vc's for hand placement and technique; significant truncal assist Transfers Transfers: Not assessed Ambulation/Gait Ambulation/Gait Assistance: Not tested (comment) Stairs: No Wheelchair Mobility Wheelchair Mobility: No Modified Rankin (Stroke Patients Only) Pre-Morbid Rankin Score: Moderate disability Modified Rankin: Severe disability    Exercises     PT Diagnosis: Difficulty walking;Hemiplegia dominant side  PT Problem List: Decreased  strength;Decreased activity tolerance;Decreased balance;Decreased mobility;Decreased knowledge of use of DME;Cardiopulmonary status limiting activity PT Treatment Interventions: DME instruction;Gait training;Functional mobility training;Therapeutic activities;Balance training;Patient/family education;Neuromuscular re-education   PT Goals Acute Rehab PT Goals PT Goal Formulation: With patient Time For Goal Achievement: 01/16/12 Potential to Achieve Goals: Good Pt will go Supine/Side to Sit: with min assist PT Goal: Supine/Side to Sit - Progress: Goal set today Pt will go Sit to Stand: with mod assist PT Goal: Sit to Stand - Progress: Goal set today Pt will Transfer Bed to Chair/Chair to Bed: with mod assist PT Transfer Goal: Bed to Chair/Chair to Bed - Progress: Goal set today Pt will Ambulate: 16 - 50 feet;with mod assist;with least restrictive assistive device PT Goal: Ambulate - Progress: Goal set today  Visit Information  Last PT Received On: 01/02/12 Assistance Needed: +2 PT/OT Co-Evaluation/Treatment: Yes    Subjective Data  Subjective: I want water Patient Stated Goal: pt did not state, but agreed wanted to be able to move around well and get back to normal   Prior Functioning  Home Living Lives With: Spouse;Other (Comment) Available Help at Discharge: Family Type of Home: Apartment Home Access: Stairs to enter Entrance Stairs-Number of Steps: 5-10 Entrance Stairs-Rails: Right;Left Home Layout: One level Bathroom Shower/Tub: Walk-in Contractor: Standard Home Adaptive Equipment: None Prior Function Driving: Yes Vocation: Unemployed Dominant Hand: Right    Cognition  Overall Cognitive Status: Impaired Arousal/Alertness: Awake/alert Orientation Level: Oriented X4 / Intact Behavior During Session: WFL for tasks performed    Extremity/Trunk Assessment Right Upper Extremity Assessment RUE ROM/Strength/Tone: [facility] for tasks assessed;Deficits Right  Lower Extremity Assessment RLE ROM/Strength/Tone: [facility] for tasks assessed;Deficits RLE ROM/Strength/Tone Deficits: grossly 4-/5  extension,>3/5 flexion Left Lower Extremity Assessment LLE ROM/Strength/Tone: Deficits;WFL for tasks assessed LLE ROM/Strength/Tone Deficits: Brunstrom 2-3, gross flexion 1/5, gross ext. 2+/5 LLE Sensation: Deficits LLE Sensation Deficits: LT diminished and less than R Trunk Assessment Trunk Assessment: Other exceptions (uncoordinated recruitment of truncal muscles)   Balance Balance Balance Assessed: Yes Static Sitting Balance Static Sitting - Balance Support: Feet supported;No upper extremity supported;Right upper extremity supported Static Sitting - Level of Assistance: 2: Max assist Static Sitting - Comment/# of Minutes: 15 Dynamic Sitting Balance Dynamic Sitting - Balance Support: During functional activity;Feet supported;No upper extremity supported Dynamic Sitting - Level of Assistance: 3: Mod assist  End of Session PT - End of Session Activity Tolerance: Patient tolerated treatment well;Other (comment) (limited by incr. HR) Patient left: in bed;with call bell/phone within reach Nurse Communication: Mobility status  GP     Trenia Tennyson, Eliseo Gum 01/02/2012, 12:45 PM  01/02/2012  [place] Bing, PT [phone] (pager)

## 2012-01-02 NOTE — Progress Notes (Signed)
SLP has reviewed and agrees with student's note below.  Tremel Setters Willis Cherylynn Liszewski M.Ed CCC-SLP Pager [phone]  01/02/2012  

## 2012-01-02 NOTE — Evaluation (Signed)
Speech Language Pathology Evaluation Patient Details Name: Collin Diaz MRN: [id number] DOB: 1955/05/25 Today's Date: 01/02/2012 Time:  -     Problem List:  Patient Active Problem List  Diagnosis  . GOUT NOS  . HYPERTENSION  . ALLERGIC RHINITIS  . KIDNEY DISEASE, CHRONIC, STAGE V  . FACIAL RASH  . Unspecified intracranial hemorrhage   Past Medical History:  Past Medical History  Diagnosis Date  . Hypertension   . Stroke    Past Surgical History: History reviewed. No pertinent past surgical history. HPI:  56 y/o male admitted for with weakness and slurring of speech. PMH significant for prior stroke with residual right-sided deficits. CT revealed right basal ganglia intraparenchymal.     Assessment / Plan / Recommendation Clinical Impression  Pt. seen for speech-language-cognitive eval, Pt. presents with severe dysarthria characterized by a low vocal quality, frequent hesitations/pauses/syllable repetions significantly decreasing intelligibility. Moderate cognitive deficits characterized by decreased sustained attention, awareness, problem solving and memory.   Exhibited mild auditory comprehension impairments likely exacerbated by lethargy . Recommend ST to  increase intelligibility and cognition.ine cognitive abilities.    SLP Assessment  Patient needs continued Speech Lanaguage Pathology Services    Follow Up Recommendations  Inpatient Rehab    Frequency and Duration min 2x/week  2 weeks      SLP Goals  SLP Goals Potential to Achieve Goals: Fair Potential Considerations: Ability to learn/carryover information;Severity of impairments;Co-morbidities Progress/Goals/Alternative treatment plan discussed with pt/caregiver and they: Agree SLP Goal #1: Pt. will utilize compensatory strategies to increase speech intellgibility with moderate verbal cueing.  SLP Goal #2: Pt. will demonstrate basic problem solving abilities in functional tasks with min verbal assist. SLP Goal  #3: Pt. will state 3 deficits affecting current function with mod assist.  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  Overall Cognitive Status: Impaired Arousal/Alertness: Lethargic Orientation Level: Disoriented to time;Disoriented to place Attention: Sustained Memory: Impaired Memory Impairment: Decreased short term memory;Decreased recall of new information Decreased Short Term Memory: Verbal basic;Functional basic Awareness: Impaired Awareness Impairment: Intellectual impairment;Emergent impairment;Anticipatory impairment Problem Solving: Impaired Problem Solving Impairment: Verbal complex;Functional complex Executive Function: Decision Making;Organizing;Initiating;Reasoning;Self Monitoring;Self Correcting;Sequencing Reasoning: Impaired Reasoning Impairment: Verbal complex;Functional complex Sequencing: Appears intact Sequencing Impairment: Verbal complex;Functional complex Organizing: Impaired Organizing Impairment: Functional basic;Verbal basic Decision Making: Impaired Decision Making Impairment: Verbal complex;Functional complex;Functional basic Initiating: Impaired Initiating Impairment: Verbal basic Self Monitoring: Impaired Self Monitoring Impairment: Verbal basic;Functional basic Self Correcting: Impaired Self Correcting Impairment: Verbal basic;Functional basic Safety/Judgment: Impaired    Comprehension  Auditory Comprehension Overall Auditory Comprehension: Impaired Yes/No Questions: Impaired Commands: Within Functional Limits (1 step) Conversation: Simple Interfering Components: Motor planning;Processing speed;Attention EffectiveTechniques: Extra processing time;Increased volume;Stressing words;Slowed speech;Visual/Gestural cues Visual  Recognition/Discrimination Discrimination: Not tested Reading Comprehension Reading Status: Not tested    Expression Expression Primary Mode of Expression: Verbal Verbal Expression Overall Verbal Expression: Impaired Initiation: Impaired Level of Generative/Spontaneous Verbalization: Word Naming: Not tested Pragmatics: No impairment Interfering Components: Attention;Speech intelligibility Non-Verbal Means of Communication: Gestures Written Expression Dominant Hand: Right Written Expression: Not tested   Oral / Motor Oral Motor/Sensory Function Overall Oral Motor/Sensory Function: Impaired Labial ROM: Reduced left Labial Symmetry: Abnormal symmetry left Labial Strength: Reduced Labial Sensation: Reduced Lingual ROM: Reduced left Lingual Symmetry: Abnormal symmetry left Lingual Strength: Reduced Lingual Sensation: Reduced Facial ROM: Reduced left Facial Symmetry: Left droop Facial Strength: Reduced Facial  Sensation: Reduced Motor Speech Overall Motor Speech: Impaired Respiration: Impaired Level of Impairment: Phrase Phonation: Breathy;Hoarse;Low vocal intensity Resonance: Within functional limits Articulation: Within functional limitis Intelligibility: Intelligibility reduced Word: 0-24% accurate Phrase: 0-24% accurate Sentence: 0-24% accurate Motor Planning: Witnin functional limits Motor Speech Errors: Not applicable Interfering Components: Premorbid status        Royce Macadamia M.Ed [employer] [phone]  01/02/2012

## 2012-01-02 NOTE — Evaluation (Signed)
Occupational Therapy Evaluation Patient Details Name: Collin Diaz MRN: [id number] DOB: December 08, 1955 Today's Date: 01/02/2012 Time: 4098-1191 OT Time Calculation (min): 44 min  OT Assessment / Plan / Recommendation Clinical Impression  Pt admitted with left sided weakness. CT revealed right thalamic/basal ganglia hemorrhage with 3mm right to left midline shift. Pt presents with dense left hemiplegia and sensation deficits and will benefit from skilled OT in the acute setting followed by CIR to maximize I with ADL and ADL mobility prior to d/c    OT Assessment  Patient needs continued OT Services    Follow Up Recommendations  Inpatient Rehab    Barriers to Discharge      Equipment Recommendations  Defer to next venue    Recommendations for Other Services Rehab consult  Frequency  Min 3X/week    Precautions / Restrictions Precautions Precautions: Fall Restrictions Weight Bearing Restrictions: No   Pertinent Vitals/Pain Pt denies any pain    ADL  Grooming: Performed;Min guard Where Assessed - Grooming: Supine, head of bed up Upper Body Bathing: Simulated;Maximal assistance Where Assessed - Upper Body Bathing: Supported sitting Lower Body Bathing: Simulated;Maximal assistance Where Assessed - Lower Body Bathing: Supported sitting;Rolling right and/or left Upper Body Dressing: Simulated;Moderate assistance Where Assessed - Upper Body Dressing: Supported sitting Transfers/Ambulation Related to ADLs: NT this session ADL Comments: Sat EOB ~54min to improve trunk recruitment/stabilization. Pt progressed to Min A sitting balance ~10sec with max VC's    OT Diagnosis: Generalized weakness;Cognitive deficits;Hemiplegia non-dominant side  OT Problem List: Decreased strength;Decreased range of motion;Decreased activity tolerance;Impaired balance (sitting and/or standing);Decreased coordination;Decreased cognition;Decreased safety awareness;Decreased knowledge of use of DME or  AE;Decreased knowledge of precautions;Cardiopulmonary status limiting activity;Impaired sensation;Impaired tone;Impaired UE functional use;Increased edema OT Treatment Interventions: Self-care/ADL training;DME and/or AE instruction;Therapeutic activities;Cognitive remediation/compensation;Patient/family education;Balance training   OT Goals Acute Rehab OT Goals OT Goal Formulation: With patient Time For Goal Achievement: 01/16/12 Potential to Achieve Goals: Good ADL Goals Pt Will Perform Grooming: Sitting, chair;Sitting, edge of bed;with supervision;with set-up ADL Goal: Grooming - Progress: Goal set today Pt Will Perform Upper Body Bathing: with set-up;with supervision;Sitting, chair;Sitting, edge of bed ADL Goal: Upper Body Bathing - Progress: Goal set today Pt Will Perform Upper Body Dressing: with supervision;with set-up;Sitting, bed;Sitting, chair ADL Goal: Upper Body Dressing - Progress: Goal set today Pt Will Transfer to Toilet: with max assist;Stand pivot transfer;with DME ADL Goal: Toilet Transfer - Progress: Goal set today Pt Will Perform Toileting - Clothing Manipulation: with min assist;Standing ADL Goal: Toileting - Clothing Manipulation - Progress: Goal set today Pt Will Perform Toileting - Hygiene: with set-up;with supervision;Sitting on 3-in-1 or toilet ADL Goal: Toileting - Hygiene - Progress: Goal set today Additional ADL Goal #1: Pt will perform dyanmic sitting balance exercises greater than sitting EOB with Min guard A in prep for seated ADLs. ADL Goal: Additional Goal #1 - Progress: Goal set today Additional ADL Goal #2: Pt will be Min A with all bed mobility in prep for EOB and/or OOB activities. ADL Goal: Additional Goal #2 - Progress: Goal set today Arm Goals Pt Will Tolerate PROM: with caregiver independent in performing;Left upper extremity;to maintain range of motion;to decrease contracture;3 sets;10 reps Arm Goal: PROM - Progress: Goal set today  Visit  Information  Last OT Received On: 01/02/12 Assistance Needed: +2 PT/OT Co-Evaluation/Treatment: Yes    Subjective Data  Subjective: Water Patient Stated Goal: Pt indicates he would like to return home   Prior Functioning  Vision/Perception  Home Living Lives With: Spouse  Available Help at Discharge: Family Type of Home: House Home Access: Stairs to enter [employer] of Steps: 5-10 Entrance Stairs-Rails: Right;Left Home Layout: One level Bathroom Shower/Tub: Walk-in Contractor: Standard Home Adaptive Equipment: None Prior Function Driving: Yes Vocation: Unemployed Communication Communication: Expressive difficulties (unsure regarding receptive difficulties) Dominant Hand: Right   Vision - Assessment Additional Comments: difficult to assess secondary to cognition. It appears as though right eye is slightly exotropic (to the right) However, pt does not appear to be seeing double. Unsure if this is baseline/due to previous stroke? Pt gross visual pursuits intact. Will need further testing.  Cognition  Arousal/Alertness: Awake/alert Orientation Level: Oriented X4 / Intact Behavior During Session: WFL for tasks performed    Extremity/Trunk Assessment Right Upper Extremity Assessment RUE ROM/Strength/Tone: Deficits;WFL for tasks assessed RUE ROM/Strength/Tone Deficits: pt grossly 4+/5 most likely secondary to previous stroke. At end ranges when resistance applied, pt would exhibit shaking, possibly due to muscle fatigue? RUE Sensation: WFL - Light Touch RUE Coordination: Deficits RUE Coordination Deficits: slow to process vs motor planning deficit- will continue to assess Left Upper Extremity Assessment LUE ROM/Strength/Tone: Deficits LUE ROM/Strength/Tone Deficits: 0/5 for voluntary movement. pt with mild-moderate tone in UE, especially with biceps and pectoralis major LUE Sensation: Deficits LUE Sensation Deficits: reports sensation deficit but  is unable to describe. Is able to locate light touch LUE Coordination: Deficits LUE Coordination Deficits: absent   Mobility  Shoulder Instructions  Bed Mobility Bed Mobility: Rolling Right;Rolling Left;Left Sidelying to Sit;Sitting - Scoot to [employer] of Bed;Sit to Sidelying Left Rolling Right: 1: +2 Total assist Rolling Right: Patient Percentage: 30% Rolling Left: 3: Mod assist Left Sidelying to Sit: 1: +2 Total assist;HOB flat Left Sidelying to Sit: Patient Percentage: 30% Sitting - Scoot to Edge of Bed: 2: Max assist Sit to Sidelying Left: 1: +2 Total assist;HOB flat Sit to Sidelying Left: Patient Percentage: 30% Details for Bed Mobility Assistance: vc's for hand placement and technique; significant truncal assist       Exercise     Balance Static Sitting Balance Static Sitting - Balance Support: Feet supported;No upper extremity supported;Right upper extremity supported Static Sitting - Level of Assistance: 2: Max assist Static Sitting - Comment/# of Minutes: . strong left lean Dynamic Sitting Balance Dynamic Sitting - Balance Support: During functional activity;Feet supported;No upper extremity supported Dynamic Sitting - Level of Assistance: 3: Mod assist   End of Session OT - End of Session Activity Tolerance: Patient tolerated treatment well Patient left: in bed;with call bell/phone within reach;with nursing in room Nurse Communication: Mobility status  GO     Lenwood Balsam 01/02/2012, 6:14 PM

## 2012-01-02 NOTE — Progress Notes (Signed)
Stroke Team Progress Note  HISTORY This is a 56 y/o who had a recent ischemic stroke on the left side with some right sided weakness. Patient has a history of poorly controlled hypertension and has stopped taking medication. Patient had headaches all day and had not taken his BP medications. At 6:45 pm 12/30/2011 patient had acute onset of left hemiplegia and was unable to talk, EMS was called and brought to the ED. When EMS saw him his BP was 187/127 and upon arrival the the ED, the monitor displayed 254/143. Cardene drip was ordered but until then patient received 20 mg of Labetalol. Patient had recent ischemic stroke on 10/21/2011 on the left MCA While he was in [place], [place]. However, he does have some residual weakness on the right side. Patient seen at the bedside, he follows commands and able to pronounce few words. NIH scale of 11 Patient has been breathing very shallow and coughing and choking. Patient is a full code. Patient was not a TPA candidate secondary to hemorrhage. He was admitted to the neuro ICU for further evaluation and treatment.  Pt had small LMCA infarct July 2012 in [place] and made good functional recovery and was independent in his ADLs  SUBJECTIVE Wife at bedside. 3% and cardene off this am. Pt passed swallow this am.Bp improved off cardene drip. CT head this am shows stable hematoma and swelling and IVH.  OBJECTIVE Most recent Vital Signs: Filed Vitals:   01/02/12 0530 01/02/12 0600 01/02/12 0630 01/02/12 0700  BP: 131/99 127/74 123/87 137/87  Pulse: 99 94 92 99  Temp:      TempSrc:      Resp: 18 15 11 14   Height:      Weight:      SpO2: 96% 95%  95%   CBG (last 3)   Basename 12/30/11 2019  GLUCAP 82   Intake/Output from previous day: 09/16 0701 - 09/17 0700 In: 1675 [P.O.:120; I.V.:1335] Out: 2675 [Urine:2675]  IV Fluid Intake:     . niCARDipine Stopped (01/02/12 0000)  . DISCONTD: sodium chloride (hypertonic) 55 mL/hr at 01/01/12 0800     MEDICATIONS    . antiseptic oral rinse  15 mL Mouth Rinse BID  . antiseptic oral rinse  15 mL Mouth Rinse q12n4p  . chlorhexidine  15 mL Mouth Rinse BID  . doxazosin  4 mg Oral QHS  . hydrochlorothiazide  25 mg Oral Daily  . influenza  inactive virus vaccine  0.5 mL Intramuscular Tomorrow-1000  . lisinopril  20 mg Oral Daily  . pantoprazole (PROTONIX) IV  40 mg Intravenous Q24H  . senna-docusate  1 tablet Oral BID  . DISCONTD: lisinopril-hydrochlorothiazide  1 tablet Oral Daily   PRN:  sodium chloride, acetaminophen, acetaminophen, labetalol, ondansetron (ZOFRAN) IV  Diet:  Dysphagia, no liquids Activity:  Bedrest DVT Prophylaxis:  SCDs   CLINICALLY SIGNIFICANT STUDIES Basic Metabolic Panel:   Lab 01/02/12 0500 01/01/12 2248 01/01/12 1705 12/31/11 0550  NA 157* 154* -- --  K 4.4 -- 4.2 --  CL 122* -- 119* --  CO2 24 -- 23 --  GLUCOSE 143* -- 149* --  BUN 30* -- 25* --  CREATININE 2.60* -- 2.16* --  CALCIUM 9.7 -- 10.2 --  MG -- -- -- 1.9  PHOS -- -- -- 2.8   Liver Function Tests:   Lab 12/30/11 2015  AST 22  ALT 27  ALKPHOS 99  BILITOT 0.3  PROT 7.9  ALBUMIN 4.3   CBC:  Lab 12/31/11 0550 12/30/11 2015  WBC 5.2 6.1  NEUTROABS -- 2.6  HGB 12.1* 14.1  HCT 36.0* 40.8  MCV 88.5 88.3  PLT 127* 125*   Coagulation:   Lab 12/30/11 2015  LABPROT 12.7  INR 0.93   Cardiac Enzymes:   Lab 12/31/11 0023  CKTOTAL --  CKMB --  CKMBINDEX --  TROPONINI <0.30   Urinalysis: No results found for this basename: COLORURINE:2,APPERANCEUR:2,LABSPEC:2,PHURINE:2,GLUCOSEU:2,HGBUR:2,BILIRUBINUR:2,KETONESUR:2,PROTEINUR:2,UROBILINOGEN:2,NITRITE:2,LEUKOCYTESUR:2 in the last 168 hours Lipid Panel    Component Value Date/Time   CHOL 189 01/01/2012 1115   TRIG 88 01/01/2012 1115   HDL 61 01/01/2012 1115   CHOLHDL 3.1 01/01/2012 1115   VLDL 18 01/01/2012 1115   LDLCALC 110* 01/01/2012 1115   HgbA1C  Lab Results  Component Value Date   HGBA1C 6.3* 01/01/2012   CT of the  brain   01/02/2012 Slight interval decrease in size of the right basal ganglia hematoma.  Stable interventricular hemorrhage. Stable mass effect on the right lateral and third ventricles.  12/31/2011  No increase in size of the right basal ganglia intraparenchymal hematoma.  No change and 3 mm right-to-left shift.  Small amount of intraventricular blood, layering in both occipital horns presently.   12/30/2011   1.8 x 2.1 cm right thalamic/basal ganglia hemorrhage with right intraventricular extension and 3 mm right to left midline shift. Mild fullness of the left lateral ventricle  identified.  Chronic small vessel white matter ischemic changes and remote left basal ganglia infarct.     MRI of the brain    MRA of the brain    2D Echocardiogram    Carotid Doppler    CXR  12/30/2011  Prominent cardiomediastinal contours.  Mild interstitial prominence is nonspecific. May reflect atypical infection or edema.  No focal consolidation.  Left subclavian catheter tip projects over the brachiocephalic/SVC confluence.  Recommend lateral follow-up to exclude extension into the azygos vein.   EKG  normal sinus rhythm.   Therapy Recommendations PT - ; OT - ; ST - NPO  Physical Exam   Middle aged african [place] male not in distress.Awake alert. Afebrile. Head is nontraumatic. Neck is supple without bruit. Hearing is normal. Cardiac exam no murmur or gallop. Lungs are clear to auscultation. Distal pulses are well felt.  Neurological Exam :  Awake alert expressive aphasia with severe dysarthria and can barely be understood.follows 2 step commands. Rt gaze preference but can look to left past midline. Decrease blink to threat but can count fingers well on both sides. Left lower face weak. Tongue deviates slightly to left. Weak cough and gag. Dense lefthemiplegia 1/5 with increase tone. Good antigravity strength on right and normal sensation and coordination on right and impaired on left.Plantars leftupgoing and  right down going.  ASSESSMENT Collin Diaz is a 56 y.o. male presenting with left hemiplegia and speech impairment in setting of malignant hypertension with BP 254/143 on presentation.  Imaging confirms a right side thalamus and basal ganglia hemorrhage with intraventricular hemorrhage and cytotoxic cerebral edema and 3 mm right to left midline transfalcine herniation   Patient with resultant left hemiplegia, dysphagia and dysarthria from hemorrhage. 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 He had a mRS of 1 PTA. CT today shows decreasing edema.   Malignant Hypertension, off cardene this am. 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  Sleep apnea  History Hyperlipidemia, LDL 110, not on statin PTA   Induced hypernatremia to decrease cerebral edema, Na  157 this am, 3% placed on hold HgbA1c 6.3   Hospital day # 3  TREATMENT/PLAN  Discontinue 3% protocol, check q6h Na to ensure ongoing decline  OOB  Therapy evals  Rehab consult  Transfer to the floor later today  D/W patient, wife and Dr Craige Cotta This patient is critically ill and at significant risk of neurological worsening, death and care requires constant monitoring of vital signs, hemodynamics,respiratory and cardiac monitoring,review of multiple databases, neurological assessment, discussion with family, other specialists and medical decision making of high complexity. I spent 30 minutes of neurocritical care time  in the care of  this patient.    Annie Main, MSN, RN, ANVP-BC, ANP-BC, [facility] Pager: [phone] 01/02/2012 7:58 AM  Scribe for Dr. Delia Heady, Stroke Center Medical Director, who has personally reviewed chart, pertinent data, examined the patient and developed the plan of care. Pager:  [phone]

## 2012-01-02 NOTE — Clinical Social Work Psychosocial (Signed)
Clinical Social Work Department BRIEF PSYCHOSOCIAL ASSESSMENT 01/02/2012  Patient:  Collin Diaz, Collin Diaz     Account Number:  [id number]     Admit date:  12/30/2011  Clinical Social Worker:  Jacelyn Grip  Date/Time:  01/02/2012 11:30 AM  Referred by:  RN  Date Referred:  01/02/2012 Referred for  SNF Placement  Other - See comment   Other Referral:   Interview type:  Patient Other interview type:   and patient wife at bedside    PSYCHOSOCIAL DATA Living Status:  WIFE Admitted from facility:   Level of care:   Primary support name:  Wilma Hayton/wife/[phone] Primary support relationship to patient:  SPOUSE Degree of support available:   adequate    CURRENT CONCERNS Current Concerns  Post-Acute Placement   Other Concerns:    SOCIAL WORK ASSESSMENT / PLAN CSW received referral for new snf placement and RN discussed with CSW that pt spouse asking for food tray and requested CSW to assess. CSW met with pt and pt spouse at bedside. CSW introduced self and explained role. Pt alert and oriented, but only shook head yes and no to questions. Pt wife reports that pt, pt wife, and pt wife brother live in an apartment in [place]. Pt wife reports that pt and pt wife have been in the process of applying for disability and have been meeting with [employer] the [place] for assistance. Pt wife stated that it has recently been a "struggle" for pt and pt wife as they have been out of work and seeking disability. CSW explored pt wife resources for meals while in the hospital and pt wife reports that she has returned home and brought snacks to the hospital and planned to speak to friends and family about providing some assistance in order for her to have meals while in hospital. CSW provided pt wife two meal vouchers and encourged pt wife to bring snacks and speak to family about assisting her with meals. Pt wife reports that pt and pt wife have food stamps and she is hopeful to  obtain a ride to the grocery store to purchase food.    CSW discussed discharge planning process. CSW stated that PT/OT are recommending CIR and CIR would evaluate for pt appropriateness for CIR. CSW discussed secondary option of rehab at SNF and pt and pt wife agreeable to SNF search in [place]. Pt currently listed as self pay and CSW inquired if pt and pt wife have spoken with financial counselor. Pt wife reports that they have not yet spoken to financial counselor and CSW contacted financial counselor to ensure that pt was on list to be seen.    CSW completed FL2 and initiated SNF search to [facility]. CSW will continue to review chart for CIR evaluation and follow up with pt and pt wife in regard to bed offers for SNF as secondary option. CSW to continue to follow and facilitate pt discharge needs when pt medically ready for discharge.   Assessment/plan status:  Psychosocial Support/Ongoing Assessment of Needs Other assessment/ plan:   discharge planning   Information/referral to community resources:   Meal Vouchers, Contact number on [employer] the [place] at pt wife request as she needed to notify them of pt admission to hospital and unable to attend appointment, referral to financial counseling, [facility] list.    PATIENTS/FAMILYS RESPONSE TO PLAN OF CARE: Pt alert and oriented, but per chart currently with slurred speech and delayed responses. Pt  wife appreciative of CSW visit and assistance. Pt and pt wife appear to recognize need for rehab for pt once medically stable and the options between CIR and ST rehab at [facility].

## 2012-01-03 ENCOUNTER — Inpatient Hospital Stay (HOSPITAL_COMMUNITY)
Admission: RE | Admit: 2012-01-03 | Discharge: 2012-02-12 | DRG: 945 | Disposition: A | Discharge status: Skilled Nursing Facility | Payer: Medicaid Other | Source: Ambulatory Visit | Attending: Physical Medicine & Rehabilitation | Admitting: Physical Medicine & Rehabilitation

## 2012-01-03 DIAGNOSIS — Z5189 Encounter for other specified aftercare: Secondary | ICD-10-CM

## 2012-01-03 DIAGNOSIS — I69320 Aphasia following cerebral infarction: Secondary | ICD-10-CM

## 2012-01-03 DIAGNOSIS — I610 Nontraumatic intracerebral hemorrhage in hemisphere, subcortical: Secondary | ICD-10-CM | POA: Diagnosis present

## 2012-01-03 DIAGNOSIS — I129 Hypertensive chronic kidney disease with stage 1 through stage 4 chronic kidney disease, or unspecified chronic kidney disease: Secondary | ICD-10-CM | POA: Diagnosis present

## 2012-01-03 DIAGNOSIS — E870 Hyperosmolality and hypernatremia: Secondary | ICD-10-CM | POA: Diagnosis present

## 2012-01-03 DIAGNOSIS — R1310 Dysphagia, unspecified: Secondary | ICD-10-CM | POA: Diagnosis present

## 2012-01-03 DIAGNOSIS — F32A Depression, unspecified: Secondary | ICD-10-CM

## 2012-01-03 DIAGNOSIS — I619 Nontraumatic intracerebral hemorrhage, unspecified: Secondary | ICD-10-CM

## 2012-01-03 DIAGNOSIS — R55 Syncope and collapse: Secondary | ICD-10-CM | POA: Diagnosis present

## 2012-01-03 DIAGNOSIS — G936 Cerebral edema: Secondary | ICD-10-CM | POA: Diagnosis present

## 2012-01-03 DIAGNOSIS — E669 Obesity, unspecified: Secondary | ICD-10-CM | POA: Diagnosis present

## 2012-01-03 DIAGNOSIS — R4701 Aphasia: Secondary | ICD-10-CM | POA: Diagnosis present

## 2012-01-03 DIAGNOSIS — F329 Major depressive disorder, single episode, unspecified: Secondary | ICD-10-CM

## 2012-01-03 DIAGNOSIS — N189 Chronic kidney disease, unspecified: Secondary | ICD-10-CM | POA: Diagnosis present

## 2012-01-03 DIAGNOSIS — R569 Unspecified convulsions: Secondary | ICD-10-CM | POA: Diagnosis present

## 2012-01-03 DIAGNOSIS — I10 Essential (primary) hypertension: Secondary | ICD-10-CM

## 2012-01-03 DIAGNOSIS — N289 Disorder of kidney and ureter, unspecified: Secondary | ICD-10-CM | POA: Diagnosis present

## 2012-01-03 DIAGNOSIS — I629 Nontraumatic intracranial hemorrhage, unspecified: Secondary | ICD-10-CM

## 2012-01-03 DIAGNOSIS — G8190 Hemiplegia, unspecified affecting unspecified side: Secondary | ICD-10-CM | POA: Diagnosis present

## 2012-01-03 DIAGNOSIS — Z8673 Personal history of transient ischemic attack (TIA), and cerebral infarction without residual deficits: Secondary | ICD-10-CM

## 2012-01-03 DIAGNOSIS — I6930 Unspecified sequelae of cerebral infarction: Secondary | ICD-10-CM

## 2012-01-03 DIAGNOSIS — G4733 Obstructive sleep apnea (adult) (pediatric): Secondary | ICD-10-CM | POA: Diagnosis present

## 2012-01-03 MED ORDER — ENOXAPARIN SODIUM 40 MG/0.4ML ~~LOC~~ SOLN
40.0000 mg | Freq: Every day | SUBCUTANEOUS | Status: DC
  Administered 2012-01-04 – 2012-01-25 (×22): 40 mg via SUBCUTANEOUS
  Filled 2012-01-03 (×26): qty 0.4

## 2012-01-03 MED ORDER — DIPHENHYDRAMINE HCL 12.5 MG/5ML PO ELIX: 12.5000 mg | ORAL_SOLUTION | Freq: Four times a day (QID) | ORAL | Status: DC | PRN

## 2012-01-03 MED ORDER — CITALOPRAM HYDROBROMIDE 10 MG PO TABS
5.0000 mg | ORAL_TABLET | Freq: Every day | ORAL | Status: DC
  Administered 2012-01-04 – 2012-01-07 (×4): 5 mg via ORAL
  Filled 2012-01-03 (×7): qty 1

## 2012-01-03 MED ORDER — SENNOSIDES-DOCUSATE SODIUM 8.6-50 MG PO TABS
1.0000 | ORAL_TABLET | Freq: Every evening | ORAL | Status: DC | PRN
  Administered 2012-01-27: 1 via ORAL
  Filled 2012-01-03 (×2): qty 1

## 2012-01-03 MED ORDER — BISACODYL 10 MG RE SUPP
10.0000 mg | Freq: Every day | RECTAL | Status: DC | PRN
  Administered 2012-01-15 – 2012-02-10 (×4): 10 mg via RECTAL
  Filled 2012-01-03 (×6): qty 1

## 2012-01-03 MED ORDER — HYDROCHLOROTHIAZIDE 25 MG PO TABS
25.0000 mg | ORAL_TABLET | Freq: Every day | ORAL | Status: DC
  Administered 2012-01-04 – 2012-01-08 (×5): 25 mg via ORAL
  Filled 2012-01-03 (×7): qty 1

## 2012-01-03 MED ORDER — LISINOPRIL 20 MG PO TABS
20.0000 mg | ORAL_TABLET | Freq: Every day | ORAL | Status: DC
  Administered 2012-01-04 – 2012-01-08 (×5): 20 mg via ORAL
  Filled 2012-01-03 (×6): qty 1

## 2012-01-03 MED ORDER — GUAIFENESIN-DM 100-10 MG/5ML PO SYRP
5.0000 mL | ORAL_SOLUTION | Freq: Four times a day (QID) | ORAL | Status: DC | PRN
  Administered 2012-01-21: 10 mL via ORAL
  Filled 2012-01-03: qty 20

## 2012-01-03 MED ORDER — SODIUM CHLORIDE 0.9 % IV SOLN
INTRAVENOUS | Status: DC
  Administered 2012-01-03: 999 mL via INTRAVENOUS

## 2012-01-03 MED ORDER — TRAZODONE HCL 50 MG PO TABS: 25.0000 mg | ORAL_TABLET | Freq: Every evening | ORAL | Status: DC | PRN

## 2012-01-03 MED ORDER — ALUM & MAG HYDROXIDE-SIMETH 200-200-20 MG/5ML PO SUSP: 30.0000 mL | ORAL | Status: DC | PRN

## 2012-01-03 MED ORDER — ONDANSETRON HCL 4 MG/2ML IJ SOLN: 4.0000 mg | Freq: Four times a day (QID) | INTRAMUSCULAR | Status: DC | PRN

## 2012-01-03 MED ORDER — PROCHLORPERAZINE 25 MG RE SUPP
12.5000 mg | Freq: Four times a day (QID) | RECTAL | Status: DC | PRN
  Filled 2012-01-03: qty 1

## 2012-01-03 MED ORDER — PROCHLORPERAZINE MALEATE 5 MG PO TABS
5.0000 mg | ORAL_TABLET | Freq: Four times a day (QID) | ORAL | Status: DC | PRN
  Filled 2012-01-03: qty 2

## 2012-01-03 MED ORDER — ARIPIPRAZOLE 2 MG PO TABS
2.0000 mg | ORAL_TABLET | Freq: Every day | ORAL | Status: DC
  Administered 2012-01-04 – 2012-01-07 (×4): 2 mg via ORAL
  Filled 2012-01-03 (×5): qty 1

## 2012-01-03 MED ORDER — BIOTENE DRY MOUTH MT LIQD
15.0000 mL | Freq: Two times a day (BID) | OROMUCOSAL | Status: DC
  Administered 2012-01-04 – 2012-01-12 (×13): 15 mL via OROMUCOSAL

## 2012-01-03 MED ORDER — ACETAMINOPHEN 325 MG PO TABS
325.0000 mg | ORAL_TABLET | ORAL | Status: DC | PRN
  Administered 2012-01-06 – 2012-02-06 (×10): 650 mg via ORAL
  Filled 2012-01-03 (×16): qty 2

## 2012-01-03 MED ORDER — INFLUENZA VIRUS VACC SPLIT PF IM SUSP
0.5000 mL | INTRAMUSCULAR | Status: AC
  Filled 2012-01-03: qty 0.5

## 2012-01-03 MED ORDER — FLEET ENEMA 7-19 GM/118ML RE ENEM: 1.0000 | ENEMA | Freq: Once | RECTAL | Status: AC | PRN

## 2012-01-03 MED ORDER — PROCHLORPERAZINE EDISYLATE 5 MG/ML IJ SOLN
5.0000 mg | Freq: Four times a day (QID) | INTRAMUSCULAR | Status: DC | PRN
  Filled 2012-01-03: qty 2

## 2012-01-03 MED ORDER — SODIUM CHLORIDE 0.9 % IV SOLN
INTRAVENOUS | Status: DC
  Administered 2012-01-03 – 2012-01-05 (×4): via INTRAVENOUS

## 2012-01-03 MED ORDER — STARCH (THICKENING) PO POWD
ORAL | Status: DC | PRN
  Filled 2012-01-03 (×3): qty 227

## 2012-01-03 MED ORDER — ENOXAPARIN SODIUM 40 MG/0.4ML ~~LOC~~ SOLN
40.0000 mg | SUBCUTANEOUS | Status: DC
  Administered 2012-01-03: 40 mg via SUBCUTANEOUS
  Filled 2012-01-03 (×2): qty 0.4

## 2012-01-03 MED ORDER — DOXAZOSIN MESYLATE 4 MG PO TABS
4.0000 mg | ORAL_TABLET | Freq: Every day | ORAL | Status: DC
  Administered 2012-01-03 – 2012-01-07 (×5): 4 mg via ORAL
  Filled 2012-01-03 (×7): qty 1

## 2012-01-03 MED ORDER — CHLORHEXIDINE GLUCONATE 0.12 % MT SOLN
15.0000 mL | Freq: Two times a day (BID) | OROMUCOSAL | Status: DC
  Administered 2012-01-03 – 2012-01-12 (×18): 15 mL via OROMUCOSAL
  Filled 2012-01-03 (×22): qty 15

## 2012-01-03 NOTE — Plan of Care (Addendum)
Overall Plan of Care [facility]) Patient Details Name: Collin Diaz MRN: [id number] DOB: 02/14/1956  Diagnosis:  Rehabilitation for left hemiparesis  Primary Diagnosis:    Thalamic hemorrhage Co-morbidities: Hypertension  .  Stroke prior left MCA   Functional Problem List  Patient demonstrates impairments in the following areas: Balance, Bladder, Bowel, Cognition, Endurance, Medication Management, Motor, Nutrition, Perception and Sensory   Basic ADL's: eating, grooming, bathing, dressing and toileting Advanced ADL's: simple meal preparation and folding towels  Transfers:  bed mobility, bed to chair, toilet, tub/shower and car Locomotion:  ambulation, wheelchair mobility and stairs  Additional Impairments:  Functional use of upper extremity, Swallowing, Communication  comprehension and expression, Social Cognition   social interaction, problem solving, memory, attention and awareness and Discharge Disposition  Anticipated Outcomes Item Anticipated Outcome  Eating/Swallowing  min  Basic self-care  Mod assist  Tolieting  Mod assist  Bowel/Bladder  bowel/bladder continence  Transfers  Min-mod A  Locomotion  Supervision w/c level  Communication  min  Cognition  mod  Pain  No complaints of pain   Safety/Judgment    Other  Skin: no breakdown    Therapy Plan: PT Frequency: 2-3 X/day, 60-90 minutes;5 out of 7 days OT Frequency: 1-2 X/day, 60-90 minutes SLP Frequency: 1-2 X/day, 30-60 minutes   Team Interventions: Item RN PT OT SLP SW TR Other  Self Care/Advanced ADL Retraining   x      Neuromuscular Re-Education  x x      Therapeutic Activities  x x x     UE/LE Strength Training/ROM  x x      UE/LE Coordination Activities  x x      Visual/Perceptual Remediation/Compensation  x x      DME/Adaptive Equipment Instruction  x x      Therapeutic Exercise  x x      Balance/Vestibular Training  x x      Patient/Family Education x x x      Cognitive  Remediation/Compensation  x x x     Functional Mobility Training  x x      Ambulation/Gait Training  x       Engineer, structural Propulsion/Positioning  x       Functional Statistician   x      Community Reintegration   x      Dysphagia/Aspiration Printmaker    x     Speech/Language Facilitation         Bladder Management x        Bowel Management x        Disease Management/Prevention x        Pain Management x  x      Medication Management x        Skin Care/Wound Management x        Splinting/Orthotics  x       Discharge Planning  x x      Psychosocial Support   x                         Team Discharge Planning: Destination:  Home vs. SNF secondary to physical assistance needs Projected Follow-up:  PT, OT, SLP and Home Health Projected Equipment Needs:  Hospital Bed and Wheelchair,3:1, tub bench Patient/family involved in discharge planning:  Yes  MD ELOS: 4 weeks Medical Rehab Prognosis:  Good Assessment: 56 year old male with a recent left MCA infarct  in 2012 now with onset of left hemiparesis due to right thalamic hemorrhage. He now requires 24 7 rehabilitation RN and M.D. As well as CIR level PT OT and speech therapy

## 2012-01-03 NOTE — PMR Pre-admission (Signed)
PMR Admission Coordinator Pre-Admission Assessment  Patient: Collin Diaz is an 56 y.o., male MRN: [id number] DOB: 1955-05-08 Height: 5\' 9"  (175.3 cm) Weight: 104.6 kg (230 lb 9.6 oz)  Insurance Information Self pay  Emergency Contact Information Contact Information    Name Relation Home Work Mobile   [place] Spouse [phone]  [phone]            Ether Griffins                                                                      Niece                                     [phone]     Current Medical History  Patient Admitting Diagnosis: Intracranial hemorrhage  History of Present Illness:56 y.o. right-handed African American male history of hypertension as well as previous ischemic stroke 2012 with right-sided residual weakness. Patient independent prior to admission but not driving. Admitted 12/30/2011 with weakness and slurring of speech and blood pressure 254/143. Patient placed on Cardene drip. Cranial CT and imaging revealed right thalamus and basal ganglia hemorrhage with intraventricular hemorrhage and cytotoxic cerebral edema with right to left midline shift. Followup neurology services with tight blood pressure control. Noted dysphagia currently on a dysphagia 1 pudding thick liquid diet.   Total: 17 =NIH   Past Medical History  Past Medical History  Diagnosis Date  . Hypertension   . Stroke    Family History  family history is not on file.  Prior Rehab/Hospitalizations: None   Current Medications  Current facility-administered medications:0.9 %  sodium chloride infusion, , Intravenous, Continuous, Layne Benton, NP, Last Rate: 70 mL/hr at 01/03/12 1015, 999 mL at 01/03/12 1015;  acetaminophen (TYLENOL) suppository 650 mg, 650 mg, Rectal, Q6H PRN, Tara C Jernejcic, PA;  acetaminophen (TYLENOL) tablet 650 mg, 650 mg, Oral, Q4H PRN, Layne Benton, NP antiseptic oral rinse (BIOTENE) solution 15 mL, 15 mL, Mouth Rinse, q12n4p, Lonia Farber, MD, 15 mL at  01/02/12 1606;  ARIPiprazole (ABILIFY) tablet 2 mg, 2 mg, Oral, Daily, Layne Benton, NP, 2 mg at 01/03/12 1014;  chlorhexidine (PERIDEX) 0.12 % solution 15 mL, 15 mL, Mouth Rinse, BID, Lonia Farber, MD, 15 mL at 01/03/12 1014;  doxazosin (CARDURA) tablet 4 mg, 4 mg, Oral, QHS, Layne Benton, NP, 4 mg at 01/02/12 2242 enoxaparin (LOVENOX) injection 40 mg, 40 mg, Subcutaneous, Q24H, Layne Benton, NP, 40 mg at 01/03/12 1303;  food thickener (THICK IT) powder, , Oral, PRN, Micki Riley, MD;  hydrochlorothiazide (HYDRODIURIL) tablet 25 mg, 25 mg, Oral, Daily, Micki Riley, MD, 25 mg at 01/03/12 1013;  labetalol (NORMODYNE,TRANDATE) injection 10-40 mg, 10-40 mg, Intravenous, Q10 min PRN, Layne Benton, NP lisinopril (PRINIVIL,ZESTRIL) tablet 20 mg, 20 mg, Oral, Daily, Micki Riley, MD, 20 mg at 01/03/12 1013;  ondansetron (ZOFRAN) injection 4 mg, 4 mg, Intravenous, Q6H PRN, Layne Benton, NP;  senna-docusate (Senokot-S) tablet 1 tablet, 1 tablet, Oral, BID, Layne Benton, NP, 1 tablet at 01/03/12 1014  Patients Current Diet: Dysphagia  Precautions / Restrictions Precautions Precautions: Fall Restrictions Weight Bearing Restrictions: No  Prior Activity Level Limited Community (1-2x/wk): 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, newspaper / Equipment Home Assistive Devices/Equipment: None Home Adaptive Equipment: None  Prior Functional Level Prior Function Level of Independence: Independent Able to Take Stairs?: Yes Driving: No Vocation: Unemployed  Current Functional Level Cognition  Arousal/Alertness: Awake/alert Overall Cognitive Status: Impaired Overall Cognitive Status: Impaired Orientation Level: Oriented to person Attention: Sustained Memory: Impaired Memory Impairment: Decreased short term memory;Decreased recall of new information Decreased Short Term Memory: Verbal basic;Functional basic Awareness: Impaired Awareness Impairment: Intellectual impairment;Emergent  impairment;Anticipatory impairment Problem Solving: Impaired Problem Solving Impairment: Verbal complex;Functional complex Executive Function: Decision Making;Organizing;Initiating;Reasoning;Self Monitoring;Self Correcting;Sequencing Reasoning: Impaired Reasoning Impairment: Verbal complex;Functional complex Sequencing: Appears intact Sequencing Impairment: Verbal complex;Functional complex Organizing: Impaired Organizing Impairment: Functional basic;Verbal basic Decision Making: Impaired Decision Making Impairment: Verbal complex;Functional complex;Functional basic Initiating: Impaired Initiating Impairment: Verbal basic Self Monitoring: Impaired Self Monitoring Impairment: Verbal basic;Functional basic Self Correcting: Impaired Self Correcting Impairment: Verbal basic;Functional basic Safety/Judgment: Impaired    Extremity Assessment (includes Sensation/Coordination)  RUE ROM/Strength/Tone: Deficits;WFL for tasks assessed RUE ROM/Strength/Tone Deficits: pt grossly 4+/5 most likely secondary to previous stroke. At end ranges when resistance applied, pt would exhibit shaking, possibly due to muscle fatigue? RUE Sensation: WFL - Light Touch RUE Coordination: Deficits RUE Coordination Deficits: slow to process vs motor planning deficit- will continue to assess  RLE ROM/Strength/Tone: [facility] for tasks assessed;Deficits RLE ROM/Strength/Tone Deficits: grossly 4-/5 extension,>3/5 flexion    ADLs  Grooming: Performed;Min guard Where Assessed - Grooming: Supine, head of bed up Upper Body Bathing: Simulated;Maximal assistance Where Assessed - Upper Body Bathing: Supported sitting Lower Body Bathing: Simulated;Maximal assistance Where Assessed - Lower Body Bathing: Supported sitting;Rolling right and/or left Upper Body Dressing: Simulated;Moderate assistance Where Assessed - Upper Body Dressing: Supported sitting Transfers/Ambulation Related to ADLs: NT this session ADL Comments: Sat EOB  ~74min to improve trunk recruitment/stabilization. Pt progressed to Min A sitting balance ~10sec with max VC's    Mobility  Bed Mobility: Rolling Right;Rolling Left;Left Sidelying to Sit;Sitting - Scoot to [place] of Bed;Sit to Sidelying Left Rolling Right: 1: +2 Total assist Rolling Right: Patient Percentage: 30% Rolling Left: 3: Mod assist Left Sidelying to Sit: 1: +2 Total assist;HOB flat Left Sidelying to Sit: Patient Percentage: 30% Sitting - Scoot to Edge of Bed: 2: Max assist Sit to Sidelying Left: 1: +2 Total assist;HOB flat Sit to Sidelying Left: Patient Percentage: 30%    Transfers  Transfers: Not assessed    Ambulation / Gait / Stairs / Wheelchair Mobility  Ambulation/Gait Ambulation/Gait Assistance: Not tested (comment) Stairs: No Wheelchair Mobility Wheelchair Mobility: No    Posture / Games developer Sitting - Balance Support: Feet supported;No upper extremity supported;Right upper extremity supported Static Sitting - Level of Assistance: 2: Max assist Static Sitting - Comment/# of Minutes: . strong left lean Dynamic Sitting Balance Dynamic Sitting - Balance Support: During functional activity;Feet supported;No upper extremity supported Dynamic Sitting - Level of Assistance: 3: Mod assist     Previous Home Environment Living Arrangements: Spouse/significant other Lives With: Spouse Available Help at Discharge: Family Type of Home: House Home Layout: One level Home Access: Stairs to enter Entrance Stairs-Rails: Doctor, general practice of Steps: 5-10 Bathroom Shower/Tub: Walk-in Contractor: Standard Home Care Services: No  Discharge Living Setting Plans for Discharge Living Setting: Patient's home;House;Lives with (comment) (wife) Type of Home at Discharge: House Discharge Home Layout: One level Discharge Home Access: Stairs to enter Entrance Stairs-Rails: Can reach both Entrance Stairs-Number of  Steps:  (10 STE in front/ 3 STE in back) Do you have any problems obtaining your medications?: No  Social/Family/Support Systems Patient Roles: Spouse Contact Information: [phone] Anticipated Caregiver: Wife: Wilma Retana: Anticipated Caregiver's Contact Information: [phone] Ability/Limitations of Caregiver: none Caregiver Availability: 24/7 Discharge Plan Discussed with Primary Caregiver: Yes Is Caregiver In Agreement with Plan?: Yes Does Caregiver/Family have Issues with Lodging/Transportation while Pt is in Rehab?: No  Goals/Additional Needs Patient/Family Goal for Rehab: PT&OT min-mod A, ST: S-minA Expected length of stay: 3+ weeks Cultural Considerations: none Dietary Needs: Dys 1 diet Equipment Needs: TBD Additional Information: Pt's wife is caregiver for her brother who lives in their home - he is disabled in a w/c. Also CG for brother next door who is bilat amputee. Pt/Family Agrees to Admission and willing to participate: Yes Program Orientation Provided & Reviewed with Pt/Caregiver Including Roles  & Responsibilities: Yes  Patient Condition: This patient's condition remains as documented in the Consult dated 01/02/12, in which the Rehabilitation Physician determined and documented that the patient's condition is appropriate for intensive rehabilitative care in an inpatient rehabilitation facility.  Preadmission Screen Completed By:  Meryl Dare, 01/03/2012 2:47 PM ______________________________________________________________________   Discussed status with Dr. Riley Kill on 01/03/12 at 2:53 PM and received telephone approval for admission today.  Admission Coordinator:  Meryl Dare, time 2:53  / Date 01/03/12

## 2012-01-03 NOTE — Progress Notes (Signed)
Stroke Team Progress Note  HISTORY This is a 56 y/o who had a recent ischemic stroke on the left side with some right sided weakness. Patient has a history of poorly controlled hypertension and has stopped taking medication. Patient had headaches all day and had not taken his BP medications. At 6:45 pm 12/30/2011 patient had acute onset of left hemiplegia and was unable to talk, EMS was called and brought to the ED. When EMS saw him his BP was 187/127 and upon arrival the the ED, the monitor displayed 254/143. Cardene drip was ordered but until then patient received 20 mg of Labetalol. Patient had recent ischemic stroke on 10/21/2011 on the left MCA While he was in [place], [place]. However, he does have some residual weakness on the right side. Patient seen at the bedside, he follows commands and able to pronounce few words. NIH scale of 11 Patient has been breathing very shallow and coughing and choking. Patient is a full code. Patient was not a TPA candidate secondary to hemorrhage. He was admitted to the neuro ICU for further evaluation and treatment.  Pt had small LMCA infarct July 2012 in [place] and made good functional recovery and was independent in his ADLs  SUBJECTIVE Wife at bedside. Requested a letter to be sent to his disability lawyer stating he was disabled.  OBJECTIVE Most recent Vital Signs: Filed Vitals:   01/02/12 1734 01/02/12 2212 01/03/12 0142 01/03/12 0736  BP: 143/86 135/75 121/99 115/77  Pulse: 108 109 86 93  Temp: 97.4 F (36.3 C) 98.7 F (37.1 C) 99.6 F (37.6 C) 101.4 F (38.6 C)  TempSrc:  Oral Oral Oral  Resp: 20 18 18 18   Height:      Weight:      SpO2: 100% 97% 99% 98%   Intake/Output from previous day: 09/17 0701 - 09/18 0700 In: 80 [I.V.:80] Out: 400 [Urine:400]  IV Fluid Intake:    MEDICATIONS     . antiseptic oral rinse  15 mL Mouth Rinse q12n4p  . ARIPiprazole  2 mg Oral Daily  . chlorhexidine  15 mL Mouth Rinse BID  . doxazosin   4 mg Oral QHS  . hydrochlorothiazide  25 mg Oral Daily  . lisinopril  20 mg Oral Daily  . senna-docusate  1 tablet Oral BID   PRN:  acetaminophen, acetaminophen, food thickener, labetalol, ondansetron (ZOFRAN) IV, DISCONTD: sodium chloride  Diet:  Dysphagia, no liquids Activity:  OOB DVT Prophylaxis:  SCDs   CLINICALLY SIGNIFICANT STUDIES Basic Metabolic Panel:   Lab 01/03/12 0540 01/02/12 2232 01/02/12 0500 01/01/12 1705 12/31/11 0550  NA 154* 156* -- -- --  K -- -- 4.4 4.2 --  CL -- -- 122* 119* --  CO2 -- -- 24 23 --  GLUCOSE -- -- 143* 149* --  BUN -- -- 30* 25* --  CREATININE -- -- 2.60* 2.16* --  CALCIUM -- -- 9.7 10.2 --  MG -- -- -- -- 1.9  PHOS -- -- -- -- 2.8   Liver Function Tests:   Lab 12/30/11 2015  AST 22  ALT 27  ALKPHOS 99  BILITOT 0.3  PROT 7.9  ALBUMIN 4.3   CBC:   Lab 12/31/11 0550 12/30/11 2015  WBC 5.2 6.1  NEUTROABS -- 2.6  HGB 12.1* 14.1  HCT 36.0* 40.8  MCV 88.5 88.3  PLT 127* 125*   Coagulation:   Lab 12/30/11 2015  LABPROT 12.7  INR 0.93   Cardiac Enzymes:   Lab 12/31/11  0023  CKTOTAL --  CKMB --  CKMBINDEX --  TROPONINI <0.30   Lipid Panel    Component Value Date/Time   CHOL 189 01/01/2012 1115   TRIG 88 01/01/2012 1115   HDL 61 01/01/2012 1115   CHOLHDL 3.1 01/01/2012 1115   VLDL 18 01/01/2012 1115   LDLCALC 110* 01/01/2012 1115   HgbA1C  Lab Results  Component Value Date   HGBA1C 6.3* 01/01/2012   CT of the brain   01/02/2012 Slight interval decrease in size of the right basal ganglia hematoma.  Stable interventricular hemorrhage. Stable mass effect on the right lateral and third ventricles.  12/31/2011  No increase in size of the right basal ganglia intraparenchymal hematoma.  No change and 3 mm right-to-left shift.  Small amount of intraventricular blood, layering in both occipital horns presently.   12/30/2011   1.8 x 2.1 cm right thalamic/basal ganglia hemorrhage with right intraventricular extension and 3 mm right  to left midline shift. Mild fullness of the left lateral ventricle  identified.  Chronic small vessel white matter ischemic changes and remote left basal ganglia infarct.     CXR  12/30/2011  Prominent cardiomediastinal contours.  Mild interstitial prominence is nonspecific. May reflect atypical infection or edema.  No focal consolidation.  Left subclavian catheter tip projects over the brachiocephalic/SVC confluence.  Recommend lateral follow-up to exclude extension into the azygos vein.   EKG  normal sinus rhythm.   Therapy Recommendations PT - CIR; OT - CIR ; ST - NPO  Physical Exam   Middle aged african [place] male not in distress.Awake alert. Afebrile. Head is nontraumatic. Neck is supple without bruit. Hearing is normal. Cardiac exam no murmur or gallop. Lungs are clear to auscultation. Distal pulses are well felt.  Neurological Exam :  Awake alert expressive aphasia with severe dysarthria and can barely be understood.follows 2 step commands. Rt gaze preference but can look to left past midline. Decrease blink to threat but can count fingers well on both sides. Left lower face weak. Tongue deviates slightly to left. Weak cough and gag. Dense lefthemiplegia 1/5 with increase tone. Good antigravity strength on right and normal sensation and coordination on right and impaired on left.Plantars leftupgoing and right down going.  ASSESSMENT Mr. ALOYSIOUS VANGIESON is a 56 y.o. male presenting with left hemiplegia and speech impairment in setting of malignant hypertension with BP 254/143 on presentation.  Imaging confirms a right side thalamus and basal ganglia hemorrhage with intraventricular hemorrhage and cytotoxic cerebral edema and 3 mm right to left midline transfalcine herniation   Patient with resultant left hemiplegia, dysphagia and dysarthria from hemorrhage. 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 He had a mRS of 1 PTA. He will be disabled for life. Medically stable for rehab. CIR vs SNF  underway.   Malignant Hypertension, stabilized on home po medications.   Stroke, L MCA 10/21/2010 with residual R hemiparesis  Sleep apnea  History Hyperlipidemia, LDL 110, not on statin PTA   Induced hypernatremia to decrease cerebral edema, Na 154 this am HgbA1c 6.3  Renal insufficiency, Cr 2.0 on adm, now 2.6. Dehydrated today.  Hospital day # 4  TREATMENT/PLAN  Add IVF Check BMET daily Decrease NA checks to daily Await SNF vs CIR decision Will write letter to lawyer  Annie Main, MSN, RN, ANVP-BC, ANP-BC, GNP-BC [facility] Pager: [phone] 01/03/2012 9:46 AM  Scribe for Dr. Delia Heady, Stroke Center Medical Director, who has personally reviewed chart, pertinent data, examined the patient  and developed the plan of care. Pager:  [phone]

## 2012-01-03 NOTE — Progress Notes (Signed)
Report given to rehab nurse Gracie. Pt wife and pt taken to rehab unit. under no s/s distress.

## 2012-01-03 NOTE — Progress Notes (Signed)
Speech Language Pathology Dysphagia Treatment Patient Details Name: Collin Diaz MRN: [id number] DOB: [birth year] Today's Date: 01/03/2012 Time: 4098-1191 SLP Time Calculation (min): 17 min  Assessment / Plan / Recommendation Clinical Impression  Pt. seen for dysphagia treatment focusing on swallow safety function with Dys 1. Diet. (pudding thick liquids).  Pt. alert with decreased sustained attention but able to follow swallow precautions with max verbal/tactile cueing. Pt. also able to self-feed with minimal assist today. Moderate anterior loss of bolus and mod lingual residue which he cleared with verbal cues. Pt. did not exhibit any s/s of aspiration at bedside. SLP recommends continuing Dys 1. With pudding-thick liquids until appropriate for objective study to reassess swallow safety for diet advancement. Pt. introduced to speech strategies to improve intelligibility with SLP demonstrations. Will continue to follow therapy for dysarthria, cognition, and dysphagia     Diet Recommendation  Continue with Current Diet: Dysphagia 1 (puree);Other (comment) (pudding thick liquids)    SLP Plan Continue with current plan of care      Swallowing Goals  SLP Swallowing Goals Patient will consume recommended diet without observed clinical signs of aspiration with: Moderate cueing Swallow Study Goal #1 - Progress: Progressing toward goal Patient will utilize recommended strategies during swallow to increase swallowing safety with: Moderate assistance Swallow Study Goal #2 - Progress: Progressing toward goal  General Temperature Spikes Noted: No Behavior/Cognition: Cooperative;Requires cueing;Decreased sustained attention;Lethargic Oral Cavity - Dentition: Adequate natural dentition Patient Positioning: Upright in bed  Oral Cavity - Oral Hygiene Does patient have any of the following "at risk" factors?: Other - dysphagia;Nutritional status - inadequate Brush patient's teeth BID with toothbrush  (using toothpaste with fluoride): Yes Patient is HIGH RISK - Oral Care Protocol followed (see row info): Yes Patient is AT RISK - Oral Care Protocol followed (see row info): Yes   Dysphagia Treatment Treatment focused on: Facilitation of oral preparatory phase;Facilitation of oral phase;Utilization of compensatory strategies;Skilled observation of diet tolerance;Patient/family/caregiver education Treatment Methods/Modalities: Skilled observation Patient observed directly with PO's: Yes Type of PO's observed: Pudding-thick liquids;Dysphagia 1 (puree) Feeding: Able to feed self;Needs assist (min assist) Liquids provided via: Teaspoon Oral Phase Signs & Symptoms: Anterior loss/spillage Type of cueing: Verbal;Tactile;Visual Amount of cueing: Maximal   GO     Theotis Burrow 01/03/2012, 2:50 PM

## 2012-01-03 NOTE — Discharge Summary (Signed)
Stroke Discharge Summary  Patient ID: Collin Diaz   MRN: [id number]      DOB: 09-May-1955  Date of Admission: 12/30/2011 Date of Discharge: 01/03/2012  Attending Physician:  Darcella Cheshire, MD, Stroke MD  Consulting Physician(s):  Treatment Team:  Md Stroke, MD, Faith Rogue, MD (PM&R)  Patient's PCP:  No primary provider on file.  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  Active Problems:  Malignant hypertension  KIDNEY DISEASE, CHRONIC, STAGE V  Induced hypernatremia  OSA (obstructive sleep apnea)  Chronic ischemic left MCA stroke  Aphasia as late effect of cerebrovascular accident  Obesity, BMI  Body mass index is 34.05 kg/(m^2).   Past Medical History  Diagnosis Date  . Hypertension   . Stroke    History reviewed. No pertinent past surgical history.  Medications to be continued on Rehab   . antiseptic oral rinse  15 mL Mouth Rinse q12n4p  . ARIPiprazole  2 mg Oral Daily  . chlorhexidine  15 mL Mouth Rinse BID  . doxazosin  4 mg Oral QHS  . enoxaparin (LOVENOX) injection  40 mg Subcutaneous Q24H  . hydrochlorothiazide  25 mg Oral Daily  . lisinopril  20 mg Oral Daily  . senna-docusate  1 tablet Oral BID   LABORATORY STUDIES CBC    Component Value Date/Time   WBC 5.2 12/31/2011 0550   RBC 4.07* 12/31/2011 0550   HGB 12.1* 12/31/2011 0550   HCT 36.0* 12/31/2011 0550   PLT 127* 12/31/2011 0550   MCV 88.5 12/31/2011 0550   MCH 29.7 12/31/2011 0550   MCHC 33.6 12/31/2011 0550   RDW 14.2 12/31/2011 0550   LYMPHSABS 2.5 12/30/2011 2015   MONOABS 0.5 12/30/2011 2015   EOSABS 0.4 12/30/2011 2015   BASOSABS 0.1 12/30/2011 2015   CMP    Component Value Date/Time   NA 154* 01/03/2012 0540   K 4.4 01/02/2012 0500   CL 122* 01/02/2012 0500   CO2 24 01/02/2012 0500   GLUCOSE 143*  01/02/2012 0500   BUN 30* 01/02/2012 0500   CREATININE 2.60* 01/02/2012 0500   CALCIUM 9.7 01/02/2012 0500   PROT 7.9 12/30/2011 2015   ALBUMIN 4.3 12/30/2011 2015   AST 22 12/30/2011 2015   ALT 27 12/30/2011 2015   ALKPHOS 99 12/30/2011 2015   BILITOT 0.3 12/30/2011 2015   GFRNONAA 26* 01/02/2012 0500   GFRAA 30* 01/02/2012 0500   COAGS Lab Results  Component Value Date   INR 0.93 12/30/2011   Lipid Panel    Component Value Date/Time   CHOL 189 01/01/2012 1115   TRIG 88 01/01/2012 1115   HDL 61 01/01/2012 1115   CHOLHDL 3.1 01/01/2012 1115   VLDL 18 01/01/2012 1115   LDLCALC 110* 01/01/2012 1115   HgbA1C  Lab Results  Component Value Date   HGBA1C 6.3* 01/01/2012   SIGNIFICANT DIAGNOSTIC STUDIES CT of the brain  01/02/2012 Slight interval decrease in size of the right basal ganglia hematoma.  Stable interventricular hemorrhage. Stable mass effect on the right lateral and third ventricles.  12/31/2011 No increase in size of the right basal ganglia intraparenchymal hematoma. No change and 3 mm right-to-left shift. Small amount of intraventricular blood, layering in both occipital horns presently.  12/30/2011 1.8 x 2.1 cm right thalamic/basal ganglia hemorrhage with right intraventricular extension and 3 mm right to  left midline shift. Mild fullness of the left lateral ventricle identified. Chronic small vessel white matter ischemic changes and remote left basal ganglia infarct.  CXR 12/30/2011 Prominent cardiomediastinal contours. Mild interstitial prominence is nonspecific. May reflect atypical infection or edema. No focal consolidation. Left subclavian catheter tip projects over the brachiocephalic/SVC confluence. Recommend lateral follow-up to exclude extension into the azygos vein.  EKG normal sinus rhythm.   History of Present Illness   This is a 56 y/o who had a recent ischemic stroke on the left side with some right sided weakness. Patient has a history of poorly controlled hypertension and  has stopped taking medication. Patient had headaches all day and had not taken his BP medications. At 6:45 pm 12/30/2011 patient had acute onset of left hemiplegia and was unable to talk, EMS was called and brought to the ED. When EMS saw him his BP was 187/127 and upon arrival the the ED, the monitor displayed 254/143. Cardene drip was ordered but until then patient received 20 mg of Labetalol. Patient had recent ischemic stroke on 10/21/2011 on the left MCA While he was in [place], [place]. However, he does have some residual weakness on the right side. Patient seen at the bedside, he follows commands and able to pronounce few words. NIH scale of 11 Patient has been breathing very shallow and coughing and choking. Patient is a full code. Patient was not a TPA candidate secondary to hemorrhage. He was admitted to the neuro ICU for further evaluation and treatment.   Pt had small LMCA infarct July 2012 in [place] and made good functional recovery and was independent in his ADLs   Hospital Course  The patient's hemorrhage remained stable during the first 24h of admission, the time frame with highest risk of rebleeding. Hemorrhage was felt to be secondary to malignant hypertension. MRI was negative for underlying vascular abnormality. Once able to take POs, cardene was weaned and home medications were resumed. BP is back to baseline, but may need additional medications in the future.   He had intraventricular hemorrhage with significant cytotoxic cerebral edema and 3 mm right to left midline transfalcine herniation. He was placed on 3% normal saline to induce hypernatremia in order to decrease cerebral edema.  LDL was > 100. No statin was added given acute hemorrhage. Can consider in the future.   With continued stability, He was transferred to the floor.  Patient with resultant left hemiplegia, dysphagia and dysarthria from hemorrhage. 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 He had a mRS  of 1 PTA.Physical therapy, occupational therapy and speech therapy evaluated patient. All agreed inpatient rehab is needed. Patient's wife is supportive and can provide care at discharge. CIR bed is available today and patient will be transferred there.  A letter was sent to patient's disability lawyer at the request of his wife reporting his new disability and long-term prognosis.  Discharge Exam  Blood pressure 123/74, pulse 109, temperature 99.2 F (37.3 C), temperature source Oral, resp. rate 18, height 5\' 9"  (1.753 m), weight 104.6 kg (230 lb 9.6 oz), SpO2 98.00%. Middle aged african [place] male not in distress.Awake alert. Afebrile. Head is nontraumatic. Neck is supple without bruit. Hearing is normal. Cardiac exam no murmur or gallop. Lungs are clear to auscultation. Distal pulses are well felt.  Neurological Exam : Awake alert expressive aphasia with severe dysarthria and can barely be understood.follows 2 step commands. Rt gaze preference but can look to left past midline. Decrease blink to threat but  can count fingers well on both sides. Left lower face weak. Tongue deviates slightly to left. Weak cough and gag. Dense lefthemiplegia 1/5 with increase tone. Good antigravity strength on right and normal sensation and coordination on right and impaired on left.Plantars leftupgoing and right down going.  Discharge Diet  Dysphagia 1, no liquids  Discharge Plan - Disposition:  Transfer to [facility] for ongoing PT, OT and ST - Due to hemorrhage and risk of bleeding, do not take aspirin, aspirin-containing medications, or ibuprofen products  - monitor sodium daily until it approaches normal - Ongoing risk factor control by Primary Care Physician.  - Follow-up primary provider on in 1 month. - Follow-up with Dr. Delia Heady in 2 months.  Signed Annie Main, AVNP, ANP-BC, [facility] Nurse Practitioner 01/03/2012, 3:10 PM  Dr. Delia Heady, Stroke Center Medical  Director, has personally reviewed chart, pertinent data, examined the patient and developed the plan of care.

## 2012-01-03 NOTE — Progress Notes (Signed)
Physical Therapy Treatment Patient Details Name: Collin Diaz MRN: [id number] DOB: 27-Nov-1955 Today's Date: 01/03/2012 Time: 1308-6578 PT Time Calculation (min): 26 min  PT Assessment / Plan / Recommendation Comments on Treatment Session  Patient progressing with tolerance of activity. Able to attempt stand this session and increasing sitting tolerance    Follow Up Recommendations  Inpatient Rehab    Barriers to Discharge        Equipment Recommendations  Defer to next venue    Recommendations for Other Services    Frequency Min 3X/week   Plan Discharge plan remains appropriate;Frequency remains appropriate    Precautions / Restrictions Precautions Precautions: Fall   Pertinent Vitals/Pain     Mobility  Bed Mobility Rolling Right: 1: +2 Total assist Rolling Right: Patient Percentage: 30% Rolling Left: 3: Mod assist Left Sidelying to Sit: 1: +2 Total assist Left Sidelying to Sit: Patient Percentage: 30% Sitting - Scoot to Edge of Bed: 2: Max assist Details for Bed Mobility Assistance: A with all aspects of transfer especially truncal assist. Cues for sequency Transfers Transfers: Sit to Stand;Stand to Sit Sit to Stand: 1: +2 Total assist Sit to Stand: Patient Percentage: 20% Stand to Sit: 1: +2 Total assist Stand to Sit: Patient Percentage: 20% Details for Transfer Assistance: A to block LEs and initiate stand and anterior weight shift with hip/knee extension    Exercises     PT Diagnosis:    PT Problem List:   PT Treatment Interventions:     PT Goals Acute Rehab PT Goals PT Goal: Supine/Side to Sit - Progress: Progressing toward goal PT Goal: Sit to Stand - Progress: Progressing toward goal PT Transfer Goal: Bed to Chair/Chair to Bed - Progress: Progressing toward goal  Visit Information  Last PT Received On: 01/03/12 Assistance Needed: +2    Subjective Data      Cognition  Overall Cognitive Status: Impaired Area of Impairment: Awareness of  deficits;Attention Arousal/Alertness: Awake/alert Orientation Level: Oriented X4 / Intact Behavior During Session: [facility] for tasks performed Current Attention Level: Sustained    Balance  Static Sitting Balance Static Sitting - Balance Support: Bilateral upper extremity supported;Feet supported Static Sitting - Level of Assistance: 3: Mod assist Static Sitting - Comment/# of Minutes: 15 min with A to correct lateral lean and maintian upright posture  End of Session PT - End of Session Equipment Utilized During Treatment: Gait belt Activity Tolerance: Patient tolerated treatment well;Patient limited by fatigue Patient left: in bed;with call bell/phone within reach Nurse Communication: Mobility status   GP     Fredrich Birks 01/03/2012, 3:06 PM 01/03/2012 Fredrich Birks PTA [phone] pager [phone] office

## 2012-01-03 NOTE — Progress Notes (Signed)
Patient admitted to 4032 at 1640. Patient aphasic, wife accompanied, [facility]. Patient and family oriented to unit, call bell system, rehab schedule, and safety plan. Wife verbalized understanding. Collin Diaz

## 2012-01-03 NOTE — Progress Notes (Signed)
Spoke with Annie Main, RN, NP who reports pt to be discharged today for admission to CIR. Call for questions: [phone].

## 2012-01-03 NOTE — Clinical Social Work Note (Signed)
Clinical Social Work  CSW spoke to admissions coordinator at [facility], pt will be admitted to CIR at discharge. Per CIR, Stroke Team is aware and family is agreeable to discharge plan. CSW is signing off as no further needs identified.   Dede Query, MSW, Theresia Majors [phone]

## 2012-01-03 NOTE — Progress Notes (Signed)
CIR Admisions: Noted Neuro progress note that pt medically stable for CIR today.  Rehab bed is available and pt would benefit from inpatient rehab. Met with pt and his wife and both in agreement with plan. Await d/c order from Dr Pearlean Brownie. Pt's SW, [place] Dec aware of plan. Please call for questions: [phone].

## 2012-01-03 NOTE — H&P (Signed)
Physical Medicine and Rehabilitation Admission H&P  Chief Complaint   Patient presents with   .  Code Stroke   :  HPI: Collin Diaz is a 56 y.o. 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 Has history of poor controlled BP and had not had meds filled recently. Admitted 12/30/2011 with weakness, slurring of speech with coughing/chocking and blood pressure 254/143. Patient placed on Cardene drip. Cranial CT and imaging revealed right thalamus and basal ganglia hemorrhage with intraventricular hemorrhage and cytotoxic cerebral edema with right to left midline shift. Evaluated by neurology services with tight blood pressure control with SBP<150. Started on 3% saline drip with goal of 155-160 Na to decrease cerebral edema. Noted dysphagia currently on a dysphagia 1 diet  Patient continues with dense left hemiparesis, severe dysarthria with moderate cognitive deficits, delayed processing and impairments in auditory comprehension exacerbated by lethargy. Today noted to be dehydrated and gentle hydration initiated. Physical and occupational therapy ongoing with recommendations of physical medicine rehabilitation consult to consider inpatient rehabilitation services   ROS: Depressed mood, fatigue, denies cp, sob, headache, A 12 point review of systems has been performed and if not noted above is otherwise negative or unobtainable  Past Medical History   Diagnosis  Date   .  Hypertension    .  Stroke     History reviewed. No pertinent past surgical history.  History reviewed. No pertinent family history.  Social History: Married. Independent with ADLs and mobility since last stroke. reports that he has never smoked. He does not have any smokeless tobacco history on file. He reports that he drinks alcohol. He reports that he does not use illicit drugs.  Allergies     Allergen  Reactions   .  Nsaids      Unknown    Scheduled Meds:   .  DISCONTD: antiseptic oral rinse  15 mL  Mouth Rinse  [id number]   .  DISCONTD: ARIPiprazole  2 mg  Oral  Daily   .  DISCONTD: chlorhexidine  15 mL  Mouth Rinse  BID   .  DISCONTD: doxazosin  4 mg  Oral  QHS   .  DISCONTD: enoxaparin (LOVENOX) injection  40 mg  Subcutaneous  Q24H   .  DISCONTD: hydrochlorothiazide  25 mg  Oral  Daily   .  DISCONTD: lisinopril  20 mg  Oral  Daily   .  DISCONTD: senna-docusate  1 tablet  Oral  BID    Medications Prior to Admission   Medication  Sig  Dispense  Refill   .  allopurinol (ZYLOPRIM) 100 MG tablet  Take 100 mg by mouth daily.     .  ARIPiprazole (ABILIFY) 2 MG tablet  Take 2 mg by mouth daily.     Marland Kitchen  doxazosin (CARDURA) 4 MG tablet  Take 4 mg by mouth at bedtime.     .  hydrochlorothiazide (HYDRODIURIL) 25 MG tablet  Take 25 mg by mouth daily.     Marland Kitchen  lisinopril-hydrochlorothiazide (PRINZIDE,ZESTORETIC) 20-25 MG per tablet  Take 1 tablet by mouth daily.     Marland Kitchen  NIFEdipine (PROCARDIA XL/ADALAT-CC) 60 MG 24 hr tablet  Take 60 mg by mouth daily.     .  STUDY MEDICATION  Take 1 tablet by mouth daily. Either Febexustat, Allopurinol, or Placebo      Home:  Home Living  Lives With: Spouse  Available  Help at Discharge: Family  Type of Home: House  Home Access: Stairs to enter  [employer] of Steps: 5-10  Entrance Stairs-Rails: Right;Left  Home Layout: One level  Bathroom Shower/Tub: Walk-in Audiological scientist: Standard  Home Adaptive Equipment: None  Functional History:  Prior Function  Able to Take Stairs?: Yes  Driving: No  Vocation: Unemployed  Functional Status:  Mobility:  Bed Mobility  Bed Mobility: Rolling Right;Rolling Left;Left Sidelying to Sit;Sitting - Scoot to [place] of Bed;Sit to Sidelying Left  Rolling Right: 1: +2 Total assist  Rolling Right: Patient Percentage: 30%  Rolling Left: 3: Mod assist  Left Sidelying to Sit: 1: +2 Total  assist  Left Sidelying to Sit: Patient Percentage: 30%  Sitting - Scoot to Edge of Bed: 2: Max assist  Sit to Sidelying Left: 1: +2 Total assist;HOB flat  Sit to Sidelying Left: Patient Percentage: 30%  Transfers  Transfers: Sit to Stand;Stand to Sit  Sit to Stand: 1: +2 Total assist  Sit to Stand: Patient Percentage: 20%  Stand to Sit: 1: +2 Total assist  Stand to Sit: Patient Percentage: 20%  Ambulation/Gait  Ambulation/Gait Assistance: Not tested (comment)  Stairs: No  Wheelchair Mobility  Wheelchair Mobility: No  ADL:  ADL  Grooming: Performed;Min guard  Where Assessed - Grooming: Supine, head of bed up  Upper Body Bathing: Simulated;Maximal assistance  Where Assessed - Upper Body Bathing: Supported sitting  Lower Body Bathing: Simulated;Maximal assistance  Where Assessed - Lower Body Bathing: Supported sitting;Rolling right and/or left  Upper Body Dressing: Simulated;Moderate assistance  Where Assessed - Upper Body Dressing: Supported sitting  Transfers/Ambulation Related to ADLs: NT this session  ADL Comments: Sat EOB ~57min to improve trunk recruitment/stabilization. Pt progressed to Min A sitting balance ~10sec with max VC's  Cognition:  Cognition  Overall Cognitive Status: Impaired  Arousal/Alertness: Awake/alert  Orientation Level: Oriented to person  Attention: Sustained  Memory: Impaired  Memory Impairment: Decreased short term memory;Decreased recall of new information  Decreased Short Term Memory: Verbal basic;Functional basic  Awareness: Impaired  Awareness Impairment: Intellectual impairment;Emergent impairment;Anticipatory impairment  Problem Solving: Impaired  Problem Solving Impairment: Verbal complex;Functional complex  Executive Function: Decision Making;Organizing;Initiating;Reasoning;Self Monitoring;Self Correcting;Sequencing  Reasoning: Impaired  Reasoning Impairment: Verbal complex;Functional complex  Sequencing: Appears intact  Sequencing  Impairment: Verbal complex;Functional complex  Organizing: Impaired  Organizing Impairment: Functional basic;Verbal basic  Decision Making: Impaired  Decision Making Impairment: Verbal complex;Functional complex;Functional basic  Initiating: Impaired  Initiating Impairment: Verbal basic  Self Monitoring: Impaired  Self Monitoring Impairment: Verbal basic;Functional basic  Self Correcting: Impaired  Self Correcting Impairment: Verbal basic;Functional basic  Safety/Judgment: Impaired  Cognition  Overall Cognitive Status: Impaired  Area of Impairment: Awareness of deficits;Attention  Arousal/Alertness: Awake/alert  Orientation Level: Oriented X4 / Intact  Behavior During Session: [facility] for tasks performed  Current Attention Level: Sustained   PHYSICAL EXAM: Blood pressure 123/74, pulse 109, temperature 99.2 F (37.3 C), temperature source Oral, resp. rate 18, height 5\' 9"  (1.753 m), weight 104.6 kg (230 lb 9.6 oz), SpO2 98.00%.   Constitutional: He appears well-developed. No distress HENT:  Head: Normocephalic. White film over tongue Eyes:  Pupils round and reactive to light, EOMI grossly intact Neck: Normal range of motion. Neck supple. No thyromegaly present.  Cardiovascular: Normal rate and regular rhythm. No murmur Pulmonary/Chest: Breath sounds normal. No respiratory distress. He has no wheezes.  Abdominal: Bowel sounds are normal. He exhibits no distension. There is no tenderness.  Musculoskeletal: He exhibits no edema.  Neurological: He is alert.  Patient spoke little. He made little eye contact with examiner. low volume words if any were spoken and could follow simple commands..  He would occasionally nod yes no to simple questions. LUE and LLE with no volitional movement. He did sense pain but fine sense was diminished. He had resting tone of 1+. DTR's are 2++ on the left. Poor oromotor control and notable left tongue deviation.  Skin: Skin is warm and dry. Affect: extremely  flat  Results for orders placed during the hospital encounter of 12/30/11 (from the past 48 hour(s))   SODIUM Status: Abnormal    Collection Time    01/01/12 10:48 PM   Component  Value  Range  Comment    Sodium  154 (*)  135 - 145 mEq/L    BASIC METABOLIC PANEL Status: Abnormal    Collection Time    01/02/12 5:00 AM   Component  Value  Range  Comment    Sodium  157 (*)  135 - 145 mEq/L     Potassium  4.4  3.5 - 5.1 mEq/L     Chloride  122 (*)  96 - 112 mEq/L     CO2  24  19 - 32 mEq/L     Glucose, Bld  143 (*)  70 - 99 mg/dL     BUN  30 (*)  6 - 23 mg/dL     Creatinine, Ser  7.84 (*)  0.50 - 1.35 mg/dL     Calcium  9.7  8.4 - 10.5 mg/dL     GFR calc non Af Amer  26 (*)  >90 mL/min     GFR calc Af Amer  30 (*)  >90 mL/min    SODIUM Status: Abnormal    Collection Time    01/02/12 10:48 AM   Component  Value  Range  Comment    Sodium  155 (*)  135 - 145 mEq/L    SODIUM Status: Abnormal    Collection Time    01/02/12 5:25 PM   Component  Value  Range  Comment    Sodium  153 (*)  135 - 145 mEq/L    SODIUM Status: Abnormal    Collection Time    01/02/12 10:32 PM   Component  Value  Range  Comment    Sodium  156 (*)  135 - 145 mEq/L    SODIUM Status: Abnormal    Collection Time    01/03/12 5:40 AM   Component  Value  Range  Comment    Sodium  154 (*)  135 - 145 mEq/L     Ct Head Wo Contrast  01/02/2012 *RADIOLOGY REPORT* Clinical Data: Follow-up intracranial hemorrhage. CT HEAD WITHOUT CONTRAST Technique: Contiguous axial images were obtained from the base of the skull through the vertex without contrast. Comparison: 12/31/2011. Findings: Slight interval decrease in size of the right basal ganglia hematoma. It measures 21 x 15 mm. Stable surrounding edema and mass effect on the lateral and third ventricles. The interventricular hemorrhage is stable. Underlying significant white matter disease is again demonstrated. The CSF spaces around the brainstem are maintained. IMPRESSION: Slight  interval decrease in size of the right basal ganglia hematoma. Stable interventricular hemorrhage. Stable mass effect on the right lateral and third ventricles. Original Report Authenticated By: P. Loralie Champagne, M.D.   Post Admission Physician Evaluation:  1. Functional deficits secondary to right thalamic and basal ganglia hemorrhage with intraventricular extension/edema. 2. Patient is admitted to receive  collaborative, interdisciplinary care between the physiatrist, rehab nursing staff, and therapy team. 3. Patient's level of medical complexity and substantial therapy needs in context of that medical necessity cannot be provided at a lesser intensity of care such as a SNF. 4. Patient has experienced substantial functional loss from his/her baseline which was documented above under the "Functional History" and "Functional Status" headings. Judging by the patient's diagnosis, physical exam, and functional history, the patient has potential for functional progress which will result in measurable gains while on inpatient rehab. These gains will be of substantial and practical use upon discharge in facilitating mobility and self-care at the household level. 5. Physiatrist will provide 24 hour management of medical needs as well as oversight of the therapy plan/treatment and provide guidance as appropriate regarding the interaction of the two. 6. 24 hour rehab nursing will assist with bladder management, bowel management, safety, skin/wound care, disease management, medication administration, pain management and patient education and help integrate therapy concepts, techniques,education, etc. 7. PT will assess and treat for: fxnl mobility, NMR, strength. Goals are: min to mod assist. 8. OT will assess and treat for: UES, NMR, ADL's, fxnl mobility. Goals are: min to mod assist. 9. SLP will assess and treat for: speech, swallowing, cognition. Goals are: min assist to supervision. 10. Case Management and  Social Worker will assess and treat for psychological issues and discharge planning. 11. Team conference will be held weekly to assess progress toward goals and to determine barriers to discharge. 12. Patient will receive at least 3 hours of therapy per day at least 5 days per week. 13. ELOS: 3-4 weeks Prognosis: excellent Medical Problem List and Plan:  1. DVT Prophylaxis/Anticoagulation: Pharmaceutical: Lovenox  2. Pain Management: tylenol  3. Mood: begin low dose celexa, abilify already initiated, but change to HS dosing. Apparently he had been using this as an outpt, but it's unclear how long, why , or if he had been compliant with usage.  4. Neuropsych: This patient is not capable of making decisions on his/her own behal 5. Controlled Hypernatremia: in order to control cerebral edema  -pt actually became a bit volume depleted  -ivf initiated today.  -slowly allow Na+ to fall back to normal range  -serial labs 6. HTN: HCTZ, cardura, lisinopril  -monitor and adjust as indicated 7. Dysphagia: D1, pudding thick liquids  -ivf as above  -aggressive oral hygiene as he has poor oral-motor control  Ivory Broad, MD  -educate pt/family bp control

## 2012-01-04 ENCOUNTER — Inpatient Hospital Stay (HOSPITAL_COMMUNITY): Payer: Medicaid Other

## 2012-01-04 ENCOUNTER — Inpatient Hospital Stay (HOSPITAL_COMMUNITY): Payer: Medicaid Other | Admitting: Occupational Therapy

## 2012-01-04 ENCOUNTER — Inpatient Hospital Stay (HOSPITAL_COMMUNITY): Payer: Medicaid Other | Admitting: Physical Therapy

## 2012-01-04 DIAGNOSIS — G8110 Spastic hemiplegia affecting unspecified side: Secondary | ICD-10-CM

## 2012-01-04 DIAGNOSIS — Z5189 Encounter for other specified aftercare: Secondary | ICD-10-CM

## 2012-01-04 DIAGNOSIS — I619 Nontraumatic intracerebral hemorrhage, unspecified: Secondary | ICD-10-CM

## 2012-01-04 LAB — CBC WITH DIFFERENTIAL/PLATELET
Basophils Absolute: 0 10*3/uL (ref 0.0–0.1)
HCT: 36.4 % — ABNORMAL LOW (ref 39.0–52.0)
Lymphocytes Relative: 33 % (ref 12–46)
Neutro Abs: 3.1 10*3/uL (ref 1.7–7.7)
Neutrophils Relative %: 54 % (ref 43–77)
Platelets: 121 10*3/uL — ABNORMAL LOW (ref 150–400)
RDW: 15.2 % (ref 11.5–15.5)
WBC: 5.8 10*3/uL (ref 4.0–10.5)

## 2012-01-04 LAB — COMPREHENSIVE METABOLIC PANEL
ALT: 29 U/L (ref 0–53)
AST: 23 U/L (ref 0–37)
CO2: 22 mEq/L (ref 19–32)
Chloride: 119 mEq/L — ABNORMAL HIGH (ref 96–112)
GFR calc non Af Amer: 18 mL/min — ABNORMAL LOW (ref >90)
Potassium: 4.3 mEq/L (ref 3.5–5.1)
Sodium: 152 mEq/L — ABNORMAL HIGH (ref 135–145)
Total Bilirubin: 0.5 mg/dL (ref 0.3–1.2)

## 2012-01-04 MED ORDER — WHITE PETROLATUM GEL
Status: AC
  Administered 2012-01-04: 0.2
  Filled 2012-01-04: qty 5

## 2012-01-04 NOTE — Care Management Note (Signed)
[facility] Individual Statement of Services  Patient Name:  Collin Diaz  Date:  01/04/2012  Welcome to the [facility].  Our goal is to provide you with an individualized program based on your diagnosis and situation, designed to meet your specific needs.  With this comprehensive rehabilitation program, you will be expected to participate in at least 3 hours of rehabilitation therapies Monday-Friday, with modified therapy programming on the weekends.  Your rehabilitation program will include the following services:  Physical Therapy (PT), Occupational Therapy (OT), Speech Therapy (ST), 24 hour per day rehabilitation nursing, Therapeutic Recreaction (TR), Neuropsychology, Case Management (RN and Social Worker), Rehabilitation Medicine and Nutrition Services  Weekly team conferences will be held on Wednesday to discuss your progress.  Your RN Case Designer, television/film set will talk with you frequently to get your input and to update you on team discussions.  Team conferences with you and your family in attendance may also be held.  Expected length of stay: 3 weeks  Overall anticipated outcome: min/mod level  Depending on your progress and recovery, your program may change.  Your RN Case Estate agent will coordinate services and will keep you informed of any changes.  Your RN Sports coach and SW names and contact numbers are listed  below.  The following services may also be recommended but are not provided by the [facility]:   Driving Evaluations  Home Health Rehabiltiation Services  Outpatient Rehabilitatation [facility]  Vocational Rehabilitation   Arrangements will be made to provide these services after discharge if needed.  Arrangements include referral to agencies that provide these services.  Your insurance has been verified to be:  Self Pay-pending Medicaid Your primary doctor is:  None  Pertinent  information will be shared with your doctor and your insurance company.   Social Worker:  Dossie Der, [place] [phone]  Information discussed with and copy given to patient by: Lucy Chris, 01/04/2012, 9:07 AM

## 2012-01-04 NOTE — Progress Notes (Signed)
Patient information reviewed and entered into [facility] system by Vontrell Pullman, RN-BC, CRRN,  covering PPS Coordinator.  Information including coding and functional independence measure will be reviewed and updated through discharge.   

## 2012-01-04 NOTE — Evaluation (Signed)
Occupational Therapy Assessment and Plan  Patient Details  Name: ADIEN Diaz MRN: [id number] Date of Birth: 15-Jul-1955  OT Diagnosis: abnormal posture, altered mental status, cognitive deficits, hemiplegia affecting non-dominant side and muscle weakness (generalized) Rehab Potential: Rehab Potential: Good ELOS: 4 weeks   Today's Date: 01/04/2012 Time: 0905-1005 Time Calculation (min): 60 min  Problem List:  Patient Active Problem List  Diagnosis  . GOUT NOS  . Malignant hypertension  . ALLERGIC RHINITIS  . KIDNEY DISEASE, CHRONIC, STAGE V  . FACIAL RASH  . Thalamic hemorrhage  . Induced hypernatremia  . Obesity (BMI 30.0-34.9)  . OSA (obstructive sleep apnea)  . Renal insufficiency  . Chronic ischemic left MCA stroke  . Aphasia as late effect of cerebrovascular accident  . Depression    Past Medical History:  Past Medical History  Diagnosis Date  . Hypertension   . Stroke    Past Surgical History: No past surgical history on file.  Assessment & Plan Clinical Impression: Patient is a 56 y.o. year old male with recent admission to the hospital on 12/30/2011 with weakness, slurring of speech with coughing/chocking and blood pressure 254/143. Patient placed on Cardene drip. Cranial CT and imaging revealed right thalamus and basal ganglia hemorrhage with intraventricular hemorrhage and cytotoxic cerebral edema with right to left midline shift.  Patient transferred to CIR on 01/03/2012 .    Patient currently requires total with basic self-care skills secondary to impaired timing and sequencing, abnormal tone, unbalanced muscle activation, decreased coordination and decreased motor planning and decreased initiation, decreased attention, decreased awareness, decreased problem solving, decreased safety awareness, decreased memory and delayed processing.  Prior to hospitalization, patient could complete  with supervision.  Patient will benefit from skilled intervention to  decrease level of assist with basic self-care skills and increase independence with basic self-care skills prior to discharge home with care partner.  Anticipate patient will require moderate physical assestance and follow up home health.  OT - End of Session Activity Tolerance: Tolerates 10 - 20 min activity with multiple rests Endurance Deficit: Yes Endurance Deficit Description: very lethargic, flat affect, poor attention, falling asleep during therapy evaluation OT Assessment Rehab Potential: Good Barriers to Discharge: Decreased caregiver support Barriers to Discharge Comments: unsure if wife can provide the physical assist that pt may need post inpatient rehab stay OT Plan OT Frequency: 1-2 X/day, 60-90 minutes Estimated Length of Stay: 4 weeks OT Treatment/Interventions: Balance/vestibular training;DME/adaptive equipment instruction;Cognitive remediation/compensation;Discharge planning;Functional electrical stimulation;Neuromuscular re-education;Self Care/advanced ADL retraining;Therapeutic Activities;Patient/family education;UE/LE Strength taining/ROM;UE/LE Coordination activities;Splinting/orthotics;Therapeutic Exercise OT Recommendation Follow Up Recommendations: Home health OT Equipment Recommended: 3 in 1 bedside comode;Tub/shower bench  OT Evaluation Precautions/Restrictions  Precautions Precautions: Fall Precaution Comments: LUE and LE hemiparesis Restrictions Weight Bearing Restrictions: No  Pain Pain Assessment Pain Assessment: No/denies pain Home Living/Prior Functioning Home Living Lives With: Spouse Available Help at Discharge: Family;Available 24 hours/day Type of Home: House Home Access: Stairs to enter [employer] of Steps: Wife working on having ramp built Home Layout: One level Home Adaptive Equipment: None Prior Function Level of Independence: Independent with transfers;Independent with gait Able to Take Stairs?: Yes Driving: No Vocation:  Unemployed Leisure: Hobbies-yes (Comment) Comments: Fishing ADL   See FIM for Levels Vision/Perception  Vision - History Baseline Vision: Wears glasses all the time Patient Visual Report: No change from baseline Vision - Assessment Vision Assessment: Vision impaired - to be further tested in functional context Perception Perception: Impaired (pushes to the L)  Cognition Overall Cognitive Status: Impaired Arousal/Alertness: Lethargic  Orientation Level: Disoriented to time;Oriented to person;Oriented to place;Oriented to situation Attention: Sustained Focused Attention: Impaired Focused Attention Impairment: Functional basic Sustained Attention: Impaired Sustained Attention Impairment: Functional basic Memory: Impaired Awareness: Impaired (Pt was able to give reason for hospitalization.) Awareness Impairment: Intellectual impairment Problem Solving: Impaired Problem Solving Impairment: Functional basic Executive Function: Decision Making;Initiating;Sequencing;Self Correcting Sequencing: Impaired Sequencing Impairment: Functional basic Decision Making: Impaired Decision Making Impairment: Functional basic Initiating: Impaired Initiating Impairment: Functional basic Self Correcting: Impaired Self Correcting Impairment: Functional basic Sensation Sensation Light Touch: Appears Intact (Pt able to detect light touch in the left arm and hand.) Light Touch Impaired Details: Impaired LLE Proprioception: Impaired by gross assessment Coordination Gross Motor Movements are Fluid and Coordinated: No Fine Motor Movements are Fluid and Coordinated: No Coordination and Movement Description: Pt with Brunnstrum stage I in the left arm. Motor  Motor Motor: Hemiplegia;Abnormal postural alignment and control Motor - Skilled Clinical Observations: Left hemiparesis, sits in a posterior pelvic tilt with increased falling to the left. Mobility  Bed Mobility Bed Mobility: Rolling  Right;Rolling Left;Sit to Supine;Left Sidelying to Sit Rolling Right: 1: +1 Total assist Rolling Right Details: Tactile cues for initiation;Manual facilitation for weight shifting Rolling Left: 2: Max assist Rolling Left Details: Tactile cues for initiation;Manual facilitation for weight shifting Left Sidelying to Sit: 1: +1 Total assist Left Sidelying to Sit Details: Manual facilitation for weight shifting Supine to Sit: 1: +2 Total assist;HOB flat Supine to Sit Details: Tactile cues for initiation;Tactile cues for sequencing;Visual cues/gestures for sequencing;Verbal cues for sequencing;Verbal cues for technique Supine to Sit Details (indicate cue type and reason): Patient able to initiate RLE to EOB but required +2 total A to complete roll to L side and for side to sit Sit to Supine: 1: +2 Total assist Transfers Sit to Stand: 1: +2 Total assist;With armrests Sit to Stand Details: Tactile cues for initiation;Tactile cues for sequencing;Tactile cues for posture;Visual cues/gestures for sequencing;Verbal cues for sequencing Sit to Stand Details (indicate cue type and reason): Sit > stand from w/c with +2 A with LUE supported over therapist's shoulders; patient performing 10% with flexed posture and decreased ability to extend hips and trunk for full upright posture and mild-moderate pushing to L side  Trunk/Postural Assessment  Postural Control Postural Control: Deficits on evaluation Postural Limitations: Pt sits in a posterior pelvic tilt with increased lean to the left side and left trunk elongation.  Balance Balance Balance Assessed: Yes Static Sitting Balance Static Sitting - Balance Support: Right upper extremity supported Static Sitting - Level of Assistance: 3: Mod assist Static Sitting - Comment/# of Minutes: Sat EOB with mod-max A on L side secondary to pushing to L; pushing improved when had patient lean onto R elbow or place hand in lap Dynamic Sitting Balance Dynamic Sitting -  Balance Support: Feet supported;Bilateral upper extremity supported Dynamic Sitting - Level of Assistance: 1: +1 Total assist Static Standing Balance Static Standing - Balance Support: Bilateral upper extremity supported Static Standing - Level of Assistance: 1: +2 Total assist Extremity/Trunk Assessment RUE Assessment RUE Assessment: Within Functional Limits (per gross assessment, strength 4/5 throughout) LUE Assessment LUE Assessment: Exceptions to [facility] LUE Strength LUE Overall Strength Comments: Pt Currently Brunnstrum stage I in the left arm and hand.  PROM WFLS for all joints.  Slight increased flexor tone note in fingers and pectoral.  See FIM for current functional status Refer to Care Plan for Long Term Goals  Recommendations for other services: None  Discharge Criteria: Patient will  be discharged from OT if patient refuses treatment 3 consecutive times without medical reason, if treatment goals not met, if there is a change in medical status, if patient makes no progress towards goals or if patient is discharged from hospital.  The above assessment, treatment plan, treatment alternatives and goals were discussed and mutually agreed upon: by patient and by family  During OT session began education on selfcare retraining, techniques for bed mobility, sitting balance, and LUE positioning.  Pt currently overall total assist to total +2 assist for most selfcare tasks.  Aarvi Stotts OTR/L 01/04/2012, 12:35 PM

## 2012-01-04 NOTE — Care Management Note (Signed)
    Page 1 of 1   01/04/2012     10:02:20 AM   CARE MANAGEMENT NOTE 01/04/2012  Patient:  Collin Diaz, Collin Diaz   Account Number:  [id number]  Date Initiated:  01/01/2012  Documentation initiated by:  Jacquelynn Cree  Subjective/Objective Assessment:   Admitted with [facility]     Action/Plan:   PT/OT evals   Anticipated DC Date:  01/04/2012   Anticipated DC Plan:  HOME W HOME HEALTH SERVICES      DC Planning Services  CM consult      Choice offered to / List presented to:             Status of service:  Completed, signed off Medicare Important Message given?   (If response is "NO", the following Medicare IM given date fields will be blank) Date Medicare IM given:   Date Additional Medicare IM given:    Discharge Disposition:  IP REHAB FACILITY  Per UR Regulation:  Reviewed for med. necessity/level of care/duration of stay  If discussed at Long Length of Stay Meetings, dates discussed:    Comments:

## 2012-01-04 NOTE — Evaluation (Signed)
Speech Language Pathology Assessment and Plan  Patient Details  Name: DEZI BILL MRN: [id number] Date of Birth: 07-May-1955  SLP Diagnosis: Dysarthria;Cognitive Impairments;Aphasia;Dysphagia  Rehab Potential: Good ELOS:     Today's Date: 01/04/2012 Time: 1440-1540 Time Calculation (min): 60 min  Problem List:  Patient Active Problem List  Diagnosis  . GOUT NOS  . Malignant hypertension  . ALLERGIC RHINITIS  . KIDNEY DISEASE, CHRONIC, STAGE V  . FACIAL RASH  . Thalamic hemorrhage  . Induced hypernatremia  . Obesity (BMI 30.0-34.9)  . OSA (obstructive sleep apnea)  . Renal insufficiency  . Chronic ischemic left MCA stroke  . Aphasia as late effect of cerebrovascular accident  . Depression   Past Medical History:  Past Medical History  Diagnosis Date  . Hypertension   . Stroke    Past Surgical History: No past surgical history on file.  Assessment / Plan / Recommendation Clinical Impression  Patient is a 56 y.o. right-handed African American male history of HTN, sleep apnea, ischemic L-MCA stroke 10/21/10 with right-sided weakness with residual aphasia with minimal verbal output. Has history of poor controlled BP and had not had meds filled recently. Admitted 12/30/2011 with weakness, slurring of speech with coughing/choking and blood pressure 254/143.  Cranial CT and imaging revealed right thalamus and basal ganglia hemorrhage with intraventricular hemorrhage and cytotoxic cerebral edema with right to left midline shift. Patient transferred to CIR on 01/03/2012 .   Pt presents with significant speech and swallowing deficits due to bilateral infarcts (acute right subcortical CVA superimposed on last year's left cortical CVA.)  All subsystems of speech and swallowing have been impacted, including respiration and phonation, leading to poor respiratory effort/modulation for swallowing coordination and phonatory/speech control.  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  Apparently there is a baseline aphasia as well as acute cognitive changes impacting initiation and attention in particular.  Pt will benefit from intensive SLP services to focus initially on speech and swallowing.      SLP Assessment  Patient will need skilled Speech Language Pathology Services during CIR admission           SLP Frequency 1-2 X/day, 30-60 minutes   SLP Treatment/Interventions Multimodal communication approach;Dysphagia/aspiration precaution training;Patient/family education;Oral motor exercises     Short Term Goals: Week 1: SLP Short Term Goal 1 (Week 1): Pt will utilize respiratory support sufficient to sustain phonation for 6-10 seconds with mod cues. SLP Short Term Goal 2 (Week 1): Pt will complete effortful swallow in combination with towel tuck in order to improve hyolaryngeal elevation with mod cues. SLP Short Term Goal 3 (Week 1): Pt will tolerate dysphagia 1 diet with pudding thick liquids with mod assist for precautions.  SLP Short Term Goal 4 (Week 1): Pt will produce 1-2 word utterances with max assist to increase intelligibility.    See FIM for current functional status Refer to Care Plan for Long Term Goals  Recommendations for other services: None  Discharge Criteria: Patient will be discharged from SLP if patient refuses treatment 3 consecutive times without medical reason, if treatment goals not met, if there is a change in medical status, if patient makes no progress towards goals or if patient is discharged from hospital.  The above assessment, treatment plan, treatment alternatives and goals were discussed and mutually agreed upon: by patient  Darryon Bastin L. Patton Rabinovich, MA CCC/SLP  Pager [phone]   Blenda Mounts [name] 01/04/2012, 4:42 PM

## 2012-01-04 NOTE — Progress Notes (Signed)
Social Work Assessment and Plan Social Work Assessment and Plan  Patient Details  Name: Collin Diaz MRN: [id number] Date of Birth: 1955/11/26  Today's Date: 01/04/2012  Problem List:  Patient Active Problem List  Diagnosis  . GOUT NOS  . Malignant hypertension  . ALLERGIC RHINITIS  . KIDNEY DISEASE, CHRONIC, STAGE V  . FACIAL RASH  . Thalamic hemorrhage  . Induced hypernatremia  . Obesity (BMI 30.0-34.9)  . OSA (obstructive sleep apnea)  . Renal insufficiency  . Chronic ischemic left MCA stroke  . Aphasia as late effect of cerebrovascular accident  . Depression   Past Medical History:  Past Medical History  Diagnosis Date  . Hypertension   . Stroke    Past Surgical History: No past surgical history on file. Social History:  reports that he has never smoked. He does not have any smokeless tobacco history on file. He reports that he drinks alcohol. He reports that he does not use illicit drugs.  Family / Support Systems Marital Status: Married Patient Roles: Spouse;Parent Spouse/Significant Other: Wilma [phone]  [phone] Children: All in [facility] Anticipated Caregiver: Wife but she also has health issues Ability/Limitations of Caregiver: Wife has health issues and is caring for both of her brothers Caregiver Availability: 24/7 Family Dynamics: Wife reports they both have health issues and have been trying to get disability for some time.  Pt's family is in [facility], suppose to come and visit today.  Social History Preferred language: English Religion: [employer] Cultural Background: No issues Education: [employer] Read: Yes Write: Yes Employment Status: Unemployed Fish farm manager Issues: No issues Guardian/Conservator: None-according to MD ptis not capable of mkaing his own decisions-will look toward wife to make these decisions.   Abuse/Neglect Physical Abuse: Denies Verbal Abuse: Denies Sexual Abuse: Denies Exploitation of patient/patient's  resources: Denies Self-Neglect: Denies  Emotional Status Pt's affect, behavior adn adjustment status: Pt is non-verbal but watches everything going on.  Wife reports he has had a previous stroke and weakness from this.  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  Will assess pt when able to communicate. Recent Psychosocial Issues: Other medical issues Pyschiatric History: No history-Unable to do depression screen due to speech deficits. Will monitor him throughout stay. Substance Abuse History: No issues-question depression was suppose to take celexa prior to admission.  Patient / Family Perceptions, Expectations & Goals Pt/Family understanding of illness & functional limitations: Wife is able to explain his stroke and deficits.  She looks exhausted herself and closes her eyes while she is talking to you.  Encouraged her to go home and get some rest.  She also has health issues-BP unsure if is clear of pt's deficits. Premorbid pt/family roles/activities: Husband, Father, unemployed, etc Anticipated changes in roles/activities/participation: resume Pt/family expectations/goals: Wife states: " I hope he does well, I can only do so much for him."  Pt will be physical care due to the severity fo his stroke.  Community Resources [employer]: Other (Comment) (Family Services) Premorbid Home Care/DME Agencies: None Transportation available at discharge: Wife Resource referrals recommended: Support group (specify) (CVA Support group)  Discharge Planning Living Arrangements: Spouse/significant other;Other relatives Support Systems: Spouse/significant other;Children;Other relatives Type of Residence: Private residence Insurance Resources: Customer service manager Resources: Other (Comment) (Unemployment) Financial Screen Referred: Yes Living Expenses: Rent Money Management: Patient;Spouse Do you have any problems obtaining your medications?: Yes (Describe) (No insurance or money for meds) Home  Management: Wife Patient/Family Preliminary Plans: Return home with wife assisting with care.  She  is here today but not involved in his care, sleeping in the recliner chair.   Social Work Anticipated Follow Up Needs: HH/OP;Support Group;Other (comment) (Unsure if wife will take him home) DC Planning Additional Notes/Comments: Come up with a discharge plan, pt will require much care at discharge due to the severity of his stroke.  Clinical Impression: wife here, SSD application taken today, Medicaid appt tomorrow.  Pt has had a severe CVA assist wife with a realistic discharge plan. She slept in the recliner chair during pt's therapies.     Lucy Chris 01/04/2012, 1:25 PM

## 2012-01-04 NOTE — Evaluation (Signed)
Physical Therapy Assessment and Plan  Patient Details  Name: Collin Diaz MRN: [id number] Date of Birth: [birth year]  PT Diagnosis: Abnormal posture, Cognitive deficits, Difficulty walking, Hemiplegia non-dominant, Hypertonia, Impaired sensation and Muscle weakness Rehab Potential: Fair ELOS: 4 weeks   Today's Date: 01/04/2012 Time: 1030-1130 Time Calculation (min): 60 min  Problem List:  Patient Active Problem List  Diagnosis  . GOUT NOS  . Malignant hypertension  . ALLERGIC RHINITIS  . KIDNEY DISEASE, CHRONIC, STAGE V  . FACIAL RASH  . Thalamic hemorrhage  . Induced hypernatremia  . Obesity (BMI 30.0-34.9)  . OSA (obstructive sleep apnea)  . Renal insufficiency  . Chronic ischemic left MCA stroke  . Aphasia as late effect of cerebrovascular accident  . Depression    Past Medical History:  Past Medical History  Diagnosis Date  . Hypertension   . Stroke    Past Surgical History: No past surgical history on file.  Assessment & Plan Clinical Impression: Patient is a 56 y.o. 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 Has history of poor controlled BP and had not had meds filled recently. Admitted 12/30/2011 with weakness, slurring of speech with coughing/choking and blood pressure 254/143.  Cranial CT and imaging revealed right thalamus and basal ganglia hemorrhage with intraventricular hemorrhage and cytotoxic cerebral edema with right to left midline shift. Patient transferred to CIR on 01/03/2012 .   Patient currently requires total with mobility secondary to muscle weakness and muscle joint tightness, decreased activity tolerance/lethargy, abnormal tone and decreased motor planning, pushing to the L, decreased initiation, decreased attention, decreased awareness, decreased problem solving and delayed processing and decreased sitting balance,  decreased standing balance, decreased postural control, hemiplegia and decreased balance strategies.  Prior to hospitalization, patient was independent with mobility and lived with Spouse in a House home.  Home access isStairs to enter but wife is working having a ramp constructed for home entry/exit.  Patient will benefit from skilled PT intervention to maximize safe functional mobility, minimize fall risk and decrease caregiver burden for planned discharge home with 24 hour assist.  Anticipate patient will benefit from follow up [facility] at discharge.  PT - End of Session Activity Tolerance: Decreased this session Endurance Deficit: Yes Endurance Deficit Description: very lethargic, flat affect, poor attention, falling asleep during therapy evaluation PT Assessment Rehab Potential: Fair Barriers to Discharge: Decreased caregiver support (will require significant physical assistance) PT Plan PT Frequency: 2-3 X/day, 60-90 minutes;5 out of 7 days Estimated Length of Stay: 4 weeks PT Treatment/Interventions: Ambulation/gait training;Balance/vestibular training;Cognitive remediation/compensation;Discharge planning;DME/adaptive equipment instruction;Functional mobility training;Neuromuscular re-education;Patient/family education;Splinting/orthotics;Therapeutic Activities;Therapeutic Exercise;UE/LE Strength taining/ROM;UE/LE Coordination activities;Wheelchair propulsion/positioning PT Recommendation Follow Up Recommendations: Home health PT;24 hour supervision/assistance vs. Skilled nursing facility secondary to significant physical assistance needs Equipment Recommended: Wheelchair (measurements);Wheelchair cushion (measurements);Other (comment) (hosptial bed; ramp)  PT Evaluation Precautions/Restrictions Precautions Precautions: Fall Precaution Comments: LUE and LE hemiparesis Restrictions Weight Bearing Restrictions: No Pain Pain Assessment Pain Assessment: No/denies pain Home Living/Prior  Functioning Home Living Lives With: Spouse Available Help at Discharge: Family;Available 24 hours/day Type of Home: House Home Access: Stairs to enter [employer] of Steps: Wife working on having ramp built Home Layout: One level Home Adaptive Equipment: None Prior Function Level of Independence: Independent with transfers;Independent with gait Able to Take Stairs?: Yes Driving: No Vocation: Unemployed Leisure: Hobbies-yes (Comment) Comments: Fishing Vision/Perception  Vision - History Baseline Vision: Wears glasses all the time  Patient Visual Report: No change from baseline Vision - Assessment Vision Assessment: Vision impaired - to be further tested in functional context Perception Perception: Impaired (pushes to the L)  Cognition Overall Cognitive Status: Impaired Arousal/Alertness: Lethargic Attention: Focused;Sustained Focused Attention: Impaired Focused Attention Impairment: Functional basic Sustained Attention: Impaired Sustained Attention Impairment: Functional basic Awareness: Impaired Awareness Impairment: Intellectual impairment Problem Solving: Impaired Problem Solving Impairment: Functional basic Executive Function: Decision Making;Initiating;Sequencing;Self Correcting Sequencing: Impaired Sequencing Impairment: Functional basic Decision Making: Impaired Decision Making Impairment: Functional basic Initiating: Impaired Initiating Impairment: Functional basic Self Correcting: Impaired Self Correcting Impairment: Functional basic Sensation Sensation Light Touch: Impaired Detail Light Touch Impaired Details: Impaired LLE Proprioception: Impaired by gross assessment Coordination Gross Motor Movements are Fluid and Coordinated: No Coordination and Movement Description: Impaired by weakness Motor  Motor Motor: Hemiplegia;Abnormal tone;Abnormal postural alignment and control Motor - Skilled Clinical Observations: L hemiplegia, increased tone,  sits with L posterior lean, pushes to L  Mobility Bed Mobility Bed Mobility: Supine to Sit;Sit to Supine Supine to Sit: 1: +2 Total assist;HOB flat Supine to Sit Details: Tactile cues for initiation;Tactile cues for sequencing;Visual cues/gestures for sequencing;Verbal cues for sequencing;Verbal cues for technique Supine to Sit Details (indicate cue type and reason): Patient able to initiate RLE to EOB but required +2 total A to complete roll to L side and for side to sit Sit to Supine: 1: +2 Total assist Transfers Sit to Stand: 1: +2 Total assist;With armrests Sit to Stand Details: Tactile cues for initiation;Tactile cues for sequencing;Tactile cues for posture;Visual cues/gestures for sequencing;Verbal cues for sequencing Sit to Stand Details (indicate cue type and reason): Sit > stand from w/c with +2 A with LUE supported over therapist's shoulders; patient performing 10% with flexed posture and decreased ability to extend hips and trunk for full upright posture and mild-moderate pushing to L side Squat Pivot Transfers: 1: +2 Total assist;With armrests;With upper extremity assistance Squat Pivot Transfer Details: Tactile cues for initiation;Tactile cues for sequencing;Tactile cues for weight shifting;Tactile cues for posture;Tactile cues for placement;Visual cues/gestures for sequencing;Verbal cues for sequencing;Verbal cues for technique Squat Pivot Transfer Details (indicate cue type and reason): Multiple squat scoots from bed > w/c to R side with +2 total A with total cues for anterior lean, RUE hand placement, sequence and lifting assistance to pivot buttocks to R to w/c.  Patient assisting 10%  Locomotion  Ambulation Ambulation: No Stairs / Additional Locomotion Stairs: No Wheelchair Mobility Wheelchair Mobility: Yes Wheelchair Assistance: 1: +1 Total assist Wheelchair Assistance Details: Tactile cues for initiation;Tactile cues for sequencing;Tactile cues for placement;Visual  cues/gestures for sequencing;Verbal cues for sequencing;Verbal cues for technique Wheelchair Propulsion: Right upper extremity;Right lower extremity Wheelchair Parts Management: Needs assistance Distance: 30; with total verbal, tactile and visual cues for use of RUE and RLE for propulsion in controlled environment secondary to impaired attention, initiation and sequencing  Trunk/Postural Assessment  Postural Control Postural Control: Deficits on evaluation (L posterior lean, pushes to L intermittently)  Balance Static Sitting Balance Static Sitting - Balance Support: Right upper extremity supported;Left upper extremity supported;Feet supported Static Sitting - Level of Assistance: 3: Mod assist;2: Max assist Static Sitting - Comment/# of Minutes: Sat EOB with mod-max A on L side secondary to pushing to L; pushing improved when had patient lean onto R elbow or place hand in lap Dynamic Sitting Balance Dynamic Sitting - Balance Support: Feet supported;Bilateral upper extremity supported Dynamic Sitting - Level of Assistance: 1: +2 Total assist Static Standing Balance Static Standing - Balance  Support: Bilateral upper extremity supported Static Standing - Level of Assistance: 1: +2 Total assist Extremity Assessment  RLE Assessment RLE Assessment: Exceptions to [facility] RLE Strength RLE Overall Strength: Deficits;Due to premorbid status RLE Overall Strength Comments: h/o ischemic stroke one year ago; R sided residual weakness: 2/5 hip flexion, 4/5 knee extension, 3/5 knee flexion, DF ROM limited to neutral LLE Assessment LLE Assessment: Exceptions to [facility] LLE Strength LLE Overall Strength: Deficits LLE Overall Strength Comments: 0/5 throughout  See FIM for current functional status Refer to Care Plan for Long Term Goals  Recommendations for other services: None  Discharge Criteria: Patient will be discharged from PT if patient refuses treatment 3 consecutive times without medical reason, if  treatment goals not met, if there is a change in medical status, if patient makes no progress towards goals or if patient is discharged from hospital.  The above assessment, treatment plan, treatment alternatives and goals were discussed and mutually agreed upon: by patient and by family  Walhalla Desanctis 01/04/2012, 11:57 AM

## 2012-01-04 NOTE — Progress Notes (Signed)
Patient ID: Collin Diaz, male   DOB: 03/16/1956, 56 y.o.   MRN: [id number] HPI: Collin Diaz is a 56 y.o. 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 Has history of poor controlled BP and had not had meds filled recently. Admitted 12/30/2011 with weakness, slurring of speech with coughing/chocking and blood pressure 254/143. Patient placed on Cardene drip. Cranial CT and imaging revealed right thalamus and basal ganglia hemorrhage with intraventricular hemorrhage and cytotoxic cerebral edema with right to left midline shift. Evaluated by neurology services with tight blood pressure control with SBP<150. Started on 3% saline drip with goal of 155-160 Na to decrease cerebral edema. Noted dysphagia currently on a dysphagia 1 diet   Subjective/Complaints: Poor verbal output but responds to simple questions after a delay  Objective: Vital Signs: Blood pressure 126/83, pulse 85, temperature 97.1 F (36.2 C), temperature source Oral, resp. rate 18, SpO2 93.00%. No results found. Results for orders placed during the hospital encounter of 01/03/12 (from the past 72 hour(s))  CBC WITH DIFFERENTIAL     Status: Abnormal   Collection Time   01/04/12  6:06 AM      Component Value Range Comment   WBC 5.8  4.0 - 10.5 K/uL    RBC 3.90 (*) 4.22 - 5.81 MIL/uL    Hemoglobin 11.6 (*) 13.0 - 17.0 g/dL    HCT 78.2 (*) 95.6 - 52.0 %    MCV 93.3  78.0 - 100.0 fL    MCH 29.7  26.0 - 34.0 pg    MCHC 31.9  30.0 - 36.0 g/dL    RDW 21.3  08.6 - 57.8 %    Platelets 121 (*) 150 - 400 K/uL    Neutrophils Relative 54  43 - 77 %    Neutro Abs 3.1  1.7 - 7.7 K/uL    Lymphocytes Relative 33  12 - 46 %    Lymphs Abs 1.9  0.7 - 4.0 K/uL    Monocytes Relative 8  3 - 12 %    Monocytes Absolute 0.5  0.1 - 1.0 K/uL    Eosinophils Relative 5  0 - 5 %    Eosinophils Absolute 0.3  0.0 - 0.7  K/uL    Basophils Relative 0  0 - 1 %    Basophils Absolute 0.0  0.0 - 0.1 K/uL   COMPREHENSIVE METABOLIC PANEL     Status: Abnormal   Collection Time   01/04/12  6:06 AM      Component Value Range Comment   Sodium 152 (*) 135 - 145 mEq/L    Potassium 4.3  3.5 - 5.1 mEq/L    Chloride 119 (*) 96 - 112 mEq/L    CO2 22  19 - 32 mEq/L    Glucose, Bld 148 (*) 70 - 99 mg/dL    BUN 46 (*) 6 - 23 mg/dL    Creatinine, Ser 4.69 (*) 0.50 - 1.35 mg/dL    Calcium 9.1  8.4 - 62.9 mg/dL    Total Protein 6.9  6.0 - 8.3 g/dL    Albumin 3.2 (*) 3.5 - 5.2 g/dL    AST 23  0 - 37 U/L    ALT 29  0 - 53 U/L    Alkaline Phosphatase 90  39 - 117 U/L    Total Bilirubin 0.5  0.3 - 1.2 mg/dL    GFR calc non Af  Amer 18 (*) >90 mL/min    GFR calc Af Amer 21 (*) >90 mL/min      HEENT: normal Cardio: RRR Resp: CTA B/L GI: BS positive Extremity:  No Edema Skin:   Intact Neuro: Flat, Cranial Nerve II-XII normal, Abnormal Sensory reduced lt touch on L, Abnormal Motor o/5 on L side, Abnormal FMC Tone  Hypotonia, Dysarthric and Aphasic Musc/Skel:  Swelling Left hand  Assessment/Plan: 1. Functional deficits secondary to R thalamic hemorrhage which require 3+ hours per day of interdisciplinary therapy in a comprehensive inpatient rehab setting. Physiatrist is providing close team supervision and 24 hour management of active medical problems listed below. Physiatrist and rehab team continue to assess barriers to discharge/monitor patient progress toward functional and medical goals. FIM:                   Comprehension Comprehension Mode: Auditory Comprehension: 3-Understands basic 50 - 74% of the time/requires cueing 25 - 50%  of the time  Expression Expression Mode: Verbal Expression: 3-Expresses basic 50 - 74% of the time/requires cueing 25 - 50% of the time. Needs to repeat parts of sentences.  Social Interaction Social Interaction: 3-Interacts appropriately 50 - 74% of the time - May be  physically or verbally inappropriate.  Problem Solving Problem Solving: 3-Solves basic 50 - 74% of the time/requires cueing 25 - 49% of the time  Memory Memory: 3-Recognizes or recalls 50 - 74% of the time/requires cueing 25 - 49% of the time   Medical Problem List and Plan:  1. DVT Prophylaxis/Anticoagulation: Pharmaceutical: Lovenox  2. Pain Management: tylenol  3. Mood: begin low dose celexa, abilify already initiated, but change to HS dosing. Apparently he had been using this as an outpt, but it's unclear how long, why , or if he had been compliant with usage.  4. Neuropsych: This patient is not capable of making decisions on his/her own behal  5. Controlled Hypernatremia: in order to control cerebral edema  -pt actually became a bit volume depleted  -ivf initiated today.  -slowly allow Na+ to fall back to normal range  -serial labs  6. HTN: HCTZ, cardura, lisinopril  -monitor and adjust as indicated  7. Dysphagia: D1, pudding thick liquids  -ivf as above  -aggressive oral hygiene as he has poor oral-motor control   LOS (Days) 1 A FACE TO FACE EVALUATION WAS PERFORMED  Cori Henningsen E 01/04/2012, 7:43 AM

## 2012-01-05 ENCOUNTER — Encounter (HOSPITAL_COMMUNITY): Payer: Self-pay | Admitting: *Deleted

## 2012-01-05 ENCOUNTER — Inpatient Hospital Stay (HOSPITAL_COMMUNITY): Payer: Medicaid Other | Admitting: Physical Therapy

## 2012-01-05 ENCOUNTER — Inpatient Hospital Stay (HOSPITAL_COMMUNITY): Payer: Medicaid Other | Admitting: Speech Pathology

## 2012-01-05 ENCOUNTER — Inpatient Hospital Stay (HOSPITAL_COMMUNITY): Payer: Medicaid Other | Admitting: *Deleted

## 2012-01-05 MED ORDER — METHYLPHENIDATE HCL 5 MG PO TABS
5.0000 mg | ORAL_TABLET | Freq: Two times a day (BID) | ORAL | Status: DC
  Administered 2012-01-05 – 2012-01-07 (×5): 5 mg via ORAL
  Filled 2012-01-05 (×5): qty 1

## 2012-01-05 NOTE — Progress Notes (Signed)
Physical Therapy Session Note  Patient Details  Name: Collin Diaz MRN: [id number] Date of Birth: 1956/01/18  Today's Date: 01/05/2012 Time: 1300-1400 Time Calculation (min): 60 min  Short Term Goals: Week 1:  PT Short Term Goal 1 (Week 1): Patient will initiate 25% of the time for bed mobility training with use of UE support and max A PT Short Term Goal 2 (Week 1): Patient will transfer bed <> w/c with max A and initiate 25% of the time PT Short Term Goal 3 (Week 1): Patient will perform w/c mobility on unit x 50' with R hemi technique and 75% cues for initiation and sequencing PT Short Term Goal 4 (Week 1): Patient will tolerate dynamic sitting balance and postural control training activities without back support x 15 minutes with mod-max A PT Short Term Goal 5 (Week 1): Patient will tolerate static standing in appropriate lift equipment for NMR to LLE, trunk control and pre-gait activities x 5 minutes  Therapy Documentation Precautions:  Precautions Precautions: Fall Precaution Comments: LUE and LE hemiparesis Restrictions Weight Bearing Restrictions: No Vital Signs: Therapy Vitals Temp: 98.6 F (37 C) Temp src: Oral Pulse Rate: 108  BP: 91/58 mmHg (I informed RN of patient's low BP) Patient Position, if appropriate: Sitting Oxygen Therapy SpO2: 99 % O2 Device: None (Room air) Pain: Pain Assessment Pain Assessment: No/denies pain Mobility:  Performed lateral scooting w/c <> mat with slideboard and +2 A with constant verbal, tactile and visual cues for attention to task, initiation, sequencing and total physical assistance for trunk elongation during anterior lean, trunk rotation to pivot hips.  Following NMR practiced lateral scooting on mat to L and R with max-total A with improved forward lean and patient able to elevate hips with max A 25% of time and able to pivot to R and then to L 50% of the time.  Fatigued quickly. Balance: Dynamic Sitting Balance Dynamic Sitting  - Balance Support: Left upper extremity supported;Feet supported Dynamic Sitting - Balance Activities: Lateral lean/weight shifting;Forward lean/weight shifting;Reaching for objects;Reaching across midline;Trunk control activities sitting on mat with LUE supported in ER and extension during lateral leans to R elbow and then returning to midline with therapist acting as boundary; reaching with RUE forward, upwards, across midline and to the R for horseshoe targets with focus on attention to task and active trunk elongation, forward and R lateral weight shifts to unweight L pelvis; fatigued quickly; RUE still exhibits significant shoulder flexion/ABD weakness from previous CVA.  See FIM for current functional status  Therapy/Group: Individual Therapy  [name] [facility] 01/05/2012, 2:36 PM

## 2012-01-05 NOTE — Progress Notes (Signed)
Speech Language Pathology Daily Session Note  Patient Details  Name: Collin Diaz MRN: [id number] Date of Birth: 02/14/1956  Today's Date: 01/05/2012 Time: 4098-1191 Time Calculation (min): 35 min  Short Term Goals: Week 1: SLP Short Term Goal 1 (Week 1): Pt will utilize respiratory support sufficient to sustain phonation for 6-10 seconds with mod cues. SLP Short Term Goal 2 (Week 1): Pt will complete effortful swallow in combination with towel tuck in order to improve hyolaryngeal elevation with mod cues. SLP Short Term Goal 3 (Week 1): Pt will tolerate dysphagia 1 diet with pudding thick liquids with mod assist for precautions.  SLP Short Term Goal 4 (Week 1): Pt will produce 1-2 word utterances with max assist to increase intelligibility.    Skilled Therapeutic Interventions: Treatment session focused on addressing dysphagia goals.  SLP facilitated session with breakfast of Dys.1 textures and pudding-thick liquids with max assist verbal, visual and tactile cues to recall and utilize safe swallow compensatory strategies of consuming small bites with a slow pace.  At times SLP cued patient to stop scooping next bite to facilitate improved attention to bolus in mouth and increased timeliness of swallow.  Patient appeared out of breath throughout session and required cues to stop and take breaks during intake.  Patient displayed intermittent throat clears which SLP suspects were due to pharyngeal residue, cues for an intermittent additional swallow appeared to be effective at preventing throat clears.  Wife present for session but was asleep, as a result, no education has been completed at this time and patient continues to require full staff supervision with all p.o. intake.   Recommendations:  Full staff supervision with p.o. Have suctioning available in room  FIM:  Comprehension Comprehension Mode: Auditory Comprehension: 2-Understands basic 25 - 49% of the time/requires cueing 51 - 75%  of the time Expression Expression Mode: Verbal Expression: 1-Expresses basis less than 25% of the time/requires cueing greater than 75% of the time. Social Interaction Social Interaction: 2-Interacts appropriately 25 - 49% of time - Needs frequent redirection. Problem Solving Problem Solving: 2-Solves basic 25 - 49% of the time - needs direction more than half the time to initiate, plan or complete simple activities Memory Memory: 2-Recognizes or recalls 25 - 49% of the time/requires cueing 51 - 75% of the time FIM - Eating Eating Activity: 4: Helper checks for pocketed food;4: Helper occasionally scoops food on utensil  Pain Pain Assessment Pain Assessment: No/denies pain  Therapy/Group: Individual Therapy  Collin Diaz., CCC-SLP [phone]  Collin Diaz 01/05/2012, 1:47 PM

## 2012-01-05 NOTE — Progress Notes (Signed)
Occupational Therapy Session Note  Patient Details  Name: Collin Diaz MRN: [id number] Date of Birth: [birth year]  Today's Date: 01/05/2012 Time:  - 0800-0900  (60 min)                1430-1500  (30 min)    Short Term Goals: Week 1:  OT Short Term Goal 1 (Week 1): Pt will maintain static sitting for 5 mins with close supervision in preparation for selfcare tasks. OT Short Term Goal 2 (Week 1): Pt will perform LB bathing sit to stand with Total +2 (pt 40%). OT Short Term Goal 3 (Week 1): Pt will perfrom UB dressing in supportive sitting with min facilitation. OT Short Term Goal 4 (Week 1): Pt will perform supine to sit with max assist in preparation for selfcare tasks. OT Short Term Goal 5 (Week 1): Pt will perform toilet transfers squat pivot with max assist.     Skilled Therapeutic Interventions/Progress Updates:    1st session:  Pt. Engaged in bathing and dressing supine and supportive sitting level.  Pt. Needed total assist to roll to right and left with placement for LUE.  Total assist to sit EOB and total assit with sliding board transfer.     Pt needed manual facilitation with max assist for postural control and midline orientation.    2nd session:  Addressed LUE PROM and transfer to bed  (total assit +2, pt = 20 %.  Wife,  [name],  present  Therapy Documentation Precautions:  Precautions Precautions: Fall Precaution Comments: LUE and LE hemiparesis Restrictions Weight Bearing Restrictions: No    Pain:  None indicated       See FIM for current functional status  Therapy/Group: Individual Therapy  Humberto Seals 01/05/2012, 9:07 AM

## 2012-01-05 NOTE — Progress Notes (Signed)
Patient ID: Collin Diaz, male   DOB: 1955/08/10, 56 y.o.   MRN: [id number] HPI: Collin Diaz is a 56 y.o. 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 Has history of poor controlled BP and had not had meds filled recently. Admitted 12/30/2011 with weakness, slurring of speech with coughing/chocking and blood pressure 254/143. Patient placed on Cardene drip. Cranial CT and imaging revealed right thalamus and basal ganglia hemorrhage with intraventricular hemorrhage and cytotoxic cerebral edema with right to left midline shift. Evaluated by neurology services with tight blood pressure control with SBP<150. Started on 3% saline drip with goal of 155-160 Na to decrease cerebral edema. Noted dysphagia currently on a dysphagia 1 diet   Subjective/Complaints: Poor verbal output but responds to simple questions after a delay  Objective: Vital Signs: Blood pressure 127/87, pulse 84, temperature 99.5 F (37.5 C), temperature source Oral, resp. rate 18, SpO2 97.00%. No results found. Results for orders placed during the hospital encounter of 01/03/12 (from the past 72 hour(s))  CBC WITH DIFFERENTIAL     Status: Abnormal   Collection Time   01/04/12  6:06 AM      Component Value Range Comment   WBC 5.8  4.0 - 10.5 K/uL    RBC 3.90 (*) 4.22 - 5.81 MIL/uL    Hemoglobin 11.6 (*) 13.0 - 17.0 g/dL    HCT 46.9 (*) 62.9 - 52.0 %    MCV 93.3  78.0 - 100.0 fL    MCH 29.7  26.0 - 34.0 pg    MCHC 31.9  30.0 - 36.0 g/dL    RDW 52.8  41.3 - 24.4 %    Platelets 121 (*) 150 - 400 K/uL    Neutrophils Relative 54  43 - 77 %    Neutro Abs 3.1  1.7 - 7.7 K/uL    Lymphocytes Relative 33  12 - 46 %    Lymphs Abs 1.9  0.7 - 4.0 K/uL    Monocytes Relative 8  3 - 12 %    Monocytes Absolute 0.5  0.1 - 1.0 K/uL    Eosinophils Relative 5  0 - 5 %    Eosinophils Absolute 0.3  0.0 - 0.7  K/uL    Basophils Relative 0  0 - 1 %    Basophils Absolute 0.0  0.0 - 0.1 K/uL   COMPREHENSIVE METABOLIC PANEL     Status: Abnormal   Collection Time   01/04/12  6:06 AM      Component Value Range Comment   Sodium 152 (*) 135 - 145 mEq/L    Potassium 4.3  3.5 - 5.1 mEq/L    Chloride 119 (*) 96 - 112 mEq/L    CO2 22  19 - 32 mEq/L    Glucose, Bld 148 (*) 70 - 99 mg/dL    BUN 46 (*) 6 - 23 mg/dL    Creatinine, Ser 0.10 (*) 0.50 - 1.35 mg/dL    Calcium 9.1  8.4 - 27.2 mg/dL    Total Protein 6.9  6.0 - 8.3 g/dL    Albumin 3.2 (*) 3.5 - 5.2 g/dL    AST 23  0 - 37 U/L    ALT 29  0 - 53 U/L    Alkaline Phosphatase 90  39 - 117 U/L    Total Bilirubin 0.5  0.3 - 1.2 mg/dL    GFR calc non Af  Amer 18 (*) >90 mL/min    GFR calc Af Amer 21 (*) >90 mL/min      HEENT: normal Cardio: RRR Resp: CTA B/L GI: BS positive Extremity:  No Edema Skin:   Intact Neuro: Flat, Cranial Nerve II-XII normal, Abnormal Sensory reduced lt touch on L, Abnormal Motor o/5 on L side, Abnormal FMC Tone  Hypotonia, Dysarthric and Aphasic Musc/Skel:  Swelling Left hand  Assessment/Plan: 1. Functional deficits secondary to R thalamic hemorrhage which require 3+ hours per day of interdisciplinary therapy in a comprehensive inpatient rehab setting. Physiatrist is providing close team supervision and 24 hour management of active medical problems listed below. Physiatrist and rehab team continue to assess barriers to discharge/monitor patient progress toward functional and medical goals. FIM: FIM - Bathing Bathing Steps Patient Completed: Chest;Left Arm;Abdomen;Right upper leg;Left upper leg Bathing: 1: Two helpers  FIM - Upper Body Dressing/Undressing Upper body dressing/undressing steps patient completed: Put head through opening of pull over shirt/dress Upper body dressing/undressing: 2: Max-Patient completed 25-49% of tasks FIM - Lower Body Dressing/Undressing Lower body dressing/undressing: 1: Two  helpers  FIM - Toileting Toileting: 1: Two helpers     FIM - Games developer Transfer: 1: Supine > Sit: Total A (helper does all/Pt. < 25%);1: Sit > Supine: Total A (helper does all/Pt. < 25%);1: Bed > Chair or W/C: Total A (helper does all/Pt. < 25%);1: Chair or W/C > Bed: Total A (helper does all/Pt. < 25%);1: Two helpers  FIM - Locomotion: Wheelchair Distance: 30; with total verbal, tactile and visual cues for use of RUE and RLE for propulsion in controlled environment secondary to impaired attention, initiation and sequencing Locomotion: Wheelchair: 1: Total Assistance/staff pushes wheelchair (Pt<25%) FIM - Locomotion: Ambulation Locomotion: Ambulation: 0: Activity did not occur (unsafe secondary to impairments)  Comprehension Comprehension Mode: Auditory Comprehension: 4-Understands basic 75 - 89% of the time/requires cueing 10 - 24% of the time  Expression Expression Mode: Verbal Expression: 2-Expresses basic 25 - 49% of the time/requires cueing 50 - 75% of the time. Uses single words/gestures.  Social Interaction Social Interaction: 2-Interacts appropriately 25 - 49% of time - Needs frequent redirection.  Problem Solving Problem Solving: 2-Solves basic 25 - 49% of the time - needs direction more than half the time to initiate, plan or complete simple activities  Memory Memory: 3-Recognizes or recalls 50 - 74% of the time/requires cueing 25 - 49% of the time   Medical Problem List and Plan:  1. DVT Prophylaxis/Anticoagulation: Pharmaceutical: Lovenox  2. Pain Management: tylenol  3. Mood: begin low dose celexa, abilify already initiated, but change to HS dosing. Apparently he had been using this as an outpt, but it's unclear how long, why , or if he had been compliant with usage.  4. Neuropsych: This patient is not capable of making decisions on his/her own behal  5. Controlled Hypernatremia: in order to control cerebral edema  -pt actually became a bit  volume depleted  -ivf initiated today.  -slowly allow Na+ to fall back to normal range  -serial labs  6. HTN: HCTZ, cardura, lisinopril  -monitor and adjust as indicated  7. Dysphagia: D1, pudding thick liquids  -ivf as above will reduce rate -aggressive oral hygiene as he has poor oral-motor control   LOS (Days) 2 A FACE TO FACE EVALUATION WAS PERFORMED  [name],[name] 01/05/2012, 7:38 AM

## 2012-01-06 ENCOUNTER — Inpatient Hospital Stay (HOSPITAL_COMMUNITY): Payer: Self-pay | Admitting: Speech Pathology

## 2012-01-06 ENCOUNTER — Inpatient Hospital Stay (HOSPITAL_COMMUNITY): Payer: Medicaid Other | Admitting: Occupational Therapy

## 2012-01-06 ENCOUNTER — Inpatient Hospital Stay (HOSPITAL_COMMUNITY): Payer: Medicaid Other | Admitting: *Deleted

## 2012-01-06 ENCOUNTER — Inpatient Hospital Stay (HOSPITAL_COMMUNITY): Payer: Medicaid Other | Admitting: Physical Therapy

## 2012-01-06 MED ORDER — SODIUM CHLORIDE 0.45 % IV SOLN
INTRAVENOUS | Status: DC
  Administered 2012-01-06: 18:00:00 via INTRAVENOUS

## 2012-01-06 NOTE — Progress Notes (Addendum)
Physical Therapy Note  Patient Details  Name: Collin Diaz MRN: [id number] Date of Birth: 12/22/1955 Today's Date: 01/06/2012  4098-1191 (40 minutes) individual Pain: no reported pain/no distress noted Focus of treatment: Bed mobility training; transfer training; sitting balance Treatment: pt in bed; max assist to donn pants in supine ; rolling left tactile cues + mod assist using bedrail; rolling right max assist ; supine to side to sit max assist +1; sitting edge of bed min assist ; pt unable to maintain static position without assist; transfer setup + total assist +2 using sliding board.  1300-1340 (40 minutes) individual Pain: no reported pain Focus of treatment: Neuro re-ed LT LE Treatment: transfers sliding board +2 assist (total) wc >< mat; LT LE neuro re-ed - sidelying gravity eliminated hip flexion post quick stretch to facilitate hip flexion (using mirror for visual feedback). Pt required max vcs to attend to task with intermittent trace of active hip flexion.  Collin Diaz,JIM 01/06/2012, 7:59 AM

## 2012-01-06 NOTE — Progress Notes (Signed)
Occupational Therapy Session Note  Patient Details  Name: Collin Diaz MRN: [id number] Date of Birth: 07-Jun-1955  Today's Date: 01/06/2012 Time: 4098-1191 Time Calculation (min): 45 min  Short Term Goals: Week 1:  OT Short Term Goal 1 (Week 1): Pt will maintain static sitting for 5 mins with close supervision in preparation for selfcare tasks. OT Short Term Goal 2 (Week 1): Pt will perform LB bathing sit to stand with Total +2 (pt 40%). OT Short Term Goal 3 (Week 1): Pt will perfrom UB dressing in supportive sitting with min facilitation. OT Short Term Goal 4 (Week 1): Pt will perform supine to sit with max assist in preparation for selfcare tasks. OT Short Term Goal 5 (Week 1): Pt will perform toilet transfers squat pivot with max assist.  Skilled Therapeutic Interventions/Progress Updates: Pt seen this am with a focus on bed mobility and sitting balance, and reaching forward while engaging in LB bathing and dressing. Pt required 2 people to transfer to and from bed via sliding board to assist patient with leaning forward and sliding his hips. Pt did initiate to wash his perineal area when asked, but otherwise demonstrated a flat affect and poor initiation.  He required max assist to roll, unable to bridge hips, but in sitting on EOB he was able to hold his balance for brief episodes (1-2 min).  Pt required constant cues to not lean posteriorly. Pt needed max cues for basic LUE self ROM.       Therapy Documentation Precautions:  Precautions Precautions: Fall Precaution Comments: LUE and LE hemiparesis Restrictions Weight Bearing Restrictions: No   Pain: Pain Assessment Pain Assessment: No/denies pain  See FIM for current functional status  Therapy/Group: Individual Therapy  Pranay Hilbun 01/06/2012, 1:48 PM

## 2012-01-06 NOTE — Progress Notes (Addendum)
Speech Language Pathology Daily Session Note  Patient Details  Name: Collin Diaz MRN: [id number] Date of Birth: 03-Sep-1955  Today's Date: 01/06/2012 Time: 0830-0900 Time Calculation (min): 30 min  Short Term Goals: Week 1: SLP Short Term Goal 1 (Week 1): Pt will utilize respiratory support sufficient to sustain phonation for 6-10 seconds with mod cues. SLP Short Term Goal 1 - Progress (Week 1): Progressing toward goal SLP Short Term Goal 2 (Week 1): Pt will complete effortful swallow in combination with towel tuck in order to improve hyolaryngeal elevation with mod cues. SLP Short Term Goal 2 - Progress (Week 1): Progressing toward goal SLP Short Term Goal 3 (Week 1): Pt will tolerate dysphagia 1 diet with pudding thick liquids with mod assist for precautions.  SLP Short Term Goal 3 - Progress (Week 1): Progressing toward goal SLP Short Term Goal 4 (Week 1): Pt will produce 1-2 word utterances with max assist to increase intelligibility.   SLP Short Term Goal 4 - Progress (Week 1): Progressing toward goal  Skilled Therapeutic Interventions: Treatment session focused on attention and initiation during dysphagia and communication training.  Pt requiring max assist to follow precautions, with progressive delays and decreased initation as session progressed.  No clinical symptoms of aspiration evident, but pt required max cues to attend to and initiate a swallow response.  Max tactile and verbal cues required to inspire before sustained phonation, achieving maximum length of four seconds.  Verbalizing with  2-3 word utterances, presenting with nuerogenic-like stuttering behaviors with max assist for intelligibility.  Max assist for volume and quality.  Effortful swallow elicited with use of towel tuck for strengthening with max assist to follow directions.     FIM:   see FIM tab (not populating)  Pain Pain Assessment Pain Assessment: No/denies pain  Therapy/Group: Individual  Therapy  Blenda Mounts [name] 01/06/2012, 2:10 PM

## 2012-01-06 NOTE — Progress Notes (Signed)
Patient ID: Collin Diaz, male   DOB: 1955-12-19, 56 y.o.   MRN: [id number] HPI: Collin Diaz is a 56 y.o. 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 Has history of poor controlled BP and had not had meds filled recently. Admitted 12/30/2011 with weakness, slurring of speech with coughing/chocking and blood pressure 254/143. Patient placed on Cardene drip. Cranial CT and imaging revealed right thalamus and basal ganglia hemorrhage with intraventricular hemorrhage and cytotoxic cerebral edema with right to left midline shift. Evaluated by neurology services with tight blood pressure control with SBP<150. Started on 3% saline drip with goal of 155-160 Na to decrease cerebral edema. Noted dysphagia currently on a dysphagia 1 diet   Subjective/Complaints: Poor verbal output but responds to simple questions after a delay  Objective: Vital Signs: Blood pressure 118/73, pulse 96, temperature 99.7 F (37.6 C), temperature source Oral, resp. rate 18, SpO2 91.00%. No results found. Results for orders placed during the hospital encounter of 01/03/12 (from the past 72 hour(s))  CBC WITH DIFFERENTIAL     Status: Abnormal   Collection Time   01/04/12  6:06 AM      Component Value Range Comment   WBC 5.8  4.0 - 10.5 K/uL    RBC 3.90 (*) 4.22 - 5.81 MIL/uL    Hemoglobin 11.6 (*) 13.0 - 17.0 g/dL    HCT 78.2 (*) 95.6 - 52.0 %    MCV 93.3  78.0 - 100.0 fL    MCH 29.7  26.0 - 34.0 pg    MCHC 31.9  30.0 - 36.0 g/dL    RDW 21.3  08.6 - 57.8 %    Platelets 121 (*) 150 - 400 K/uL    Neutrophils Relative 54  43 - 77 %    Neutro Abs 3.1  1.7 - 7.7 K/uL    Lymphocytes Relative 33  12 - 46 %    Lymphs Abs 1.9  0.7 - 4.0 K/uL    Monocytes Relative 8  3 - 12 %    Monocytes Absolute 0.5  0.1 - 1.0 K/uL    Eosinophils Relative 5  0 - 5 %    Eosinophils Absolute 0.3  0.0 - 0.7  K/uL    Basophils Relative 0  0 - 1 %    Basophils Absolute 0.0  0.0 - 0.1 K/uL   COMPREHENSIVE METABOLIC PANEL     Status: Abnormal   Collection Time   01/04/12  6:06 AM      Component Value Range Comment   Sodium 152 (*) 135 - 145 mEq/L    Potassium 4.3  3.5 - 5.1 mEq/L    Chloride 119 (*) 96 - 112 mEq/L    CO2 22  19 - 32 mEq/L    Glucose, Bld 148 (*) 70 - 99 mg/dL    BUN 46 (*) 6 - 23 mg/dL    Creatinine, Ser 4.69 (*) 0.50 - 1.35 mg/dL    Calcium 9.1  8.4 - 62.9 mg/dL    Total Protein 6.9  6.0 - 8.3 g/dL    Albumin 3.2 (*) 3.5 - 5.2 g/dL    AST 23  0 - 37 U/L    ALT 29  0 - 53 U/L    Alkaline Phosphatase 90  39 - 117 U/L    Total Bilirubin 0.5  0.3 - 1.2 mg/dL    GFR calc non Af  Amer 18 (*) >90 mL/min    GFR calc Af Amer 21 (*) >90 mL/min      HEENT: normal Cardio: RRR Resp: CTA B/L GI: BS positive Extremity:  No Edema Skin:   Intact Neuro: Flat, Cranial Nerve II-XII normal, Abnormal Sensory reduced lt touch on L, Abnormal Motor o/5 on L side, Abnormal FMC Tone  Hypotonia, Dysarthric and Aphasic Musc/Skel:  Swelling Left hand  Assessment/Plan: 1. Functional deficits secondary to R thalamic hemorrhage which require 3+ hours per day of interdisciplinary therapy in a comprehensive inpatient rehab setting. Physiatrist is providing close team supervision and 24 hour management of active medical problems listed below. Physiatrist and rehab team continue to assess barriers to discharge/monitor patient progress toward functional and medical goals. FIM: FIM - Bathing Bathing Steps Patient Completed: Chest;Left Arm;Abdomen;Right upper leg;Left upper leg Bathing: 1: Two helpers  FIM - Upper Body Dressing/Undressing Upper body dressing/undressing steps patient completed: Put head through opening of pull over shirt/dress Upper body dressing/undressing: 1: Total-Patient completed less than 25% of tasks FIM - Lower Body Dressing/Undressing Lower body dressing/undressing: 1: Two  helpers  FIM - Toileting Toileting: 1: Two helpers     FIM - Architectural technologist Transfer: 1: Bed > Chair or W/C: Total A (helper does all/Pt. < 25%);1: Chair or W/C > Bed: Total A (helper does all/Pt. < 25%);1: Two helpers  FIM - Locomotion: Wheelchair Distance: 30; with total verbal, tactile and visual cues for use of RUE and RLE for propulsion in controlled environment secondary to impaired attention, initiation and sequencing Locomotion: Wheelchair: 1: Total Assistance/staff pushes wheelchair (Pt<25%) FIM - Locomotion: Ambulation Locomotion: Ambulation: 0: Activity did not occur  Comprehension Comprehension Mode: Auditory Comprehension: 2-Understands basic 25 - 49% of the time/requires cueing 51 - 75% of the time  Expression Expression Mode: Verbal Expression: 1-Expresses basis less than 25% of the time/requires cueing greater than 75% of the time.  Social Interaction Social Interaction: 2-Interacts appropriately 25 - 49% of time - Needs frequent redirection.  Problem Solving Problem Solving: 2-Solves basic 25 - 49% of the time - needs direction more than half the time to initiate, plan or complete simple activities  Memory Memory: 2-Recognizes or recalls 25 - 49% of the time/requires cueing 51 - 75% of the time   Medical Problem List and Plan:  1. DVT Prophylaxis/Anticoagulation: Pharmaceutical: Lovenox  2. Pain Management: tylenol  3. Mood: begin low dose celexa, abilify already initiated, but change to HS dosing. Apparently he had been using this as an outpt, but it's unclear how long, why , or if he had been compliant with usage.  4. Neuropsych: This patient is not capable of making decisions on his/her own behal  5. Controlled Hypernatremia: in order to control cerebral edema  -pt actually became a bit volume depleted  -ivf initiated today.  -slowly allow Na+ to fall back to normal range  -serial labs    6. HTN: HCTZ, cardura, lisinopril  -monitor and adjust as indicated  7. Dysphagia: D1, pudding thick liquids  -ivf as above will reduce rate -aggressive oral hygiene as he has poor oral-motor control   LOS (Days) 3 A FACE TO FACE EVALUATION WAS PERFORMED  [name],[name] 01/06/2012, 8:30 AM

## 2012-01-07 MED ORDER — METHYLPHENIDATE HCL 5 MG PO TABS
10.0000 mg | ORAL_TABLET | Freq: Two times a day (BID) | ORAL | Status: DC
  Administered 2012-01-07 – 2012-01-30 (×43): 10 mg via ORAL
  Filled 2012-01-07 (×31): qty 2
  Filled 2012-01-07: qty 1
  Filled 2012-01-07 (×7): qty 2
  Filled 2012-01-07: qty 1
  Filled 2012-01-07 (×5): qty 2

## 2012-01-07 NOTE — Progress Notes (Signed)
Patient ID: Collin Diaz, male   DOB: Mar 05, 1956, 56 y.o.   MRN: [id number] HPI: Collin Diaz is a 56 y.o. 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 Has history of poor controlled BP and had not had meds filled recently. Admitted 12/30/2011 with weakness, slurring of speech with coughing/chocking and blood pressure 254/143. Patient placed on Cardene drip. Cranial CT and imaging revealed right thalamus and basal ganglia hemorrhage with intraventricular hemorrhage and cytotoxic cerebral edema with right to left midline shift. Evaluated by neurology services with tight blood pressure control with SBP<150. Started on 3% saline drip with goal of 155-160 Na to decrease cerebral edema. Noted dysphagia currently on a dysphagia 1 diet   Subjective/Complaints: Flat affect minimal verbal output  Objective: Vital Signs: Blood pressure 122/84, pulse 88, temperature 98.5 F (36.9 C), temperature source Oral, resp. rate 19, SpO2 96.00%. No results found. No results found for this or any previous visit (from the past 72 hour(s)).   HEENT: normal Cardio: RRR Resp: CTA B/L GI: BS positive Extremity:  No Edema Skin:   Intact Neuro: Flat, Cranial Nerve II-XII normal, Abnormal Sensory reduced lt touch on L, Abnormal Motor o/5 on L side, Abnormal FMC Tone  Hypotonia, Dysarthric and Aphasic Musc/Skel:  Swelling Left hand  Assessment/Plan: 1. Functional deficits secondary to R thalamic hemorrhage which require 3+ hours per day of interdisciplinary therapy in a comprehensive inpatient rehab setting. Physiatrist is providing close team supervision and 24 hour management of active medical problems listed below. Physiatrist and rehab team continue to assess barriers to discharge/monitor patient progress toward functional and medical goals. FIM: FIM - Bathing Bathing Steps  Patient Completed: Front perineal area Bathing: 2: Max-Patient completes 3-4 24f 10 parts or 25-49%  FIM - Upper Body Dressing/Undressing Upper body dressing/undressing steps patient completed: Put head through opening of pull over shirt/dress Upper body dressing/undressing: 1: Total-Patient completed less than 25% of tasks FIM - Lower Body Dressing/Undressing Lower body dressing/undressing: 1: Total-Patient completed less than 25% of tasks  FIM - Toileting Toileting: 1: Two helpers     FIM - Architectural technologist Transfer: 1: Two helpers  FIM - Locomotion: Wheelchair Distance: 30; with total verbal, tactile and visual cues for use of RUE and RLE for propulsion in controlled environment secondary to impaired attention, initiation and sequencing Locomotion: Wheelchair: 1: Total Assistance/staff pushes wheelchair (Pt<25%) FIM - Locomotion: Ambulation Locomotion: Ambulation: 0: Activity did not occur  Comprehension Comprehension Mode: Auditory Comprehension: 2-Understands basic 25 - 49% of the time/requires cueing 51 - 75% of the time  Expression Expression Mode: Verbal Expression: 1-Expresses basis less than 25% of the time/requires cueing greater than 75% of the time.  Social Interaction Social Interaction: 2-Interacts appropriately 25 - 49% of time - Needs frequent redirection.  Problem Solving Problem Solving: 2-Solves basic 25 - 49% of the time - needs direction more than half the time to initiate, plan or complete simple activities  Memory Memory: 2-Recognizes or recalls 25 - 49% of the time/requires cueing 51 - 75% of the time   Medical Problem List and Plan:  1. DVT Prophylaxis/Anticoagulation: Pharmaceutical: Lovenox  2. Pain Management: tylenol  3. Mood: begin low dose celexa, abilify already initiated, but change to HS dosing. Apparently he had been using this as an outpt, but it's unclear how long, why ,  or if he had been  compliant with usage.  4. Neuropsych: This patient is not capable of making decisions on his/her own behalf Reduced intiation will increase methylphenidate 5. Controlled Hypernatremia: in order to control cerebral edema   6. HTN: HCTZ, cardura, lisinopril  -monitor and adjust as indicated  7. Dysphagia: D1, pudding thick liquids  -ivf as above will reduce rate -aggressive oral hygiene as he has poor oral-motor control   LOS (Days) 4 A FACE TO FACE EVALUATION WAS PERFORMED  Collin Diaz 01/07/2012, 8:57 AM

## 2012-01-07 NOTE — Plan of Care (Signed)
Problem: RH BLADDER ELIMINATION Goal: RH STG MANAGE BLADDER WITH ASSISTANCE STG Manage Bladder With min Assistance continent bladder  Outcome: Not Progressing Patient wears diaper during day and condom catheter at hs

## 2012-01-08 ENCOUNTER — Inpatient Hospital Stay (HOSPITAL_COMMUNITY): Payer: Self-pay | Admitting: Occupational Therapy

## 2012-01-08 ENCOUNTER — Encounter (HOSPITAL_COMMUNITY): Payer: Self-pay | Admitting: Occupational Therapy

## 2012-01-08 ENCOUNTER — Inpatient Hospital Stay (HOSPITAL_COMMUNITY): Payer: Medicaid Other

## 2012-01-08 ENCOUNTER — Inpatient Hospital Stay (HOSPITAL_COMMUNITY): Payer: Self-pay | Admitting: Physical Therapy

## 2012-01-08 ENCOUNTER — Inpatient Hospital Stay (HOSPITAL_COMMUNITY): Payer: Medicaid Other | Admitting: Speech Pathology

## 2012-01-08 LAB — BASIC METABOLIC PANEL
BUN: 53 mg/dL — ABNORMAL HIGH (ref 6–23)
CO2: 22 mEq/L (ref 19–32)
Calcium: 9.9 mg/dL (ref 8.4–10.5)
Creatinine, Ser: 3.49 mg/dL — ABNORMAL HIGH (ref 0.50–1.35)
GFR calc Af Amer: 21 mL/min — ABNORMAL LOW (ref >90)

## 2012-01-08 LAB — CBC
MCHC: 33.2 g/dL (ref 30.0–36.0)
MCV: 90.9 fL (ref 78.0–100.0)
Platelets: 150 10*3/uL (ref 150–400)
RDW: 13.5 % (ref 11.5–15.5)
WBC: 6 10*3/uL (ref 4.0–10.5)

## 2012-01-08 MED ORDER — SODIUM CHLORIDE 0.9 % IV SOLN
500.0000 mg | Freq: Two times a day (BID) | INTRAVENOUS | Status: DC
  Administered 2012-01-08 – 2012-01-09 (×3): 500 mg via INTRAVENOUS
  Filled 2012-01-08 (×4): qty 5

## 2012-01-08 MED ORDER — ARIPIPRAZOLE 2 MG PO TABS
1.0000 mg | ORAL_TABLET | Freq: Every day | ORAL | Status: DC
  Administered 2012-01-08 – 2012-01-18 (×11): 1 mg via ORAL
  Filled 2012-01-08 (×14): qty 1

## 2012-01-08 MED ORDER — SODIUM CHLORIDE 0.45 % IV BOLUS
500.0000 mL | Freq: Once | INTRAVENOUS | Status: AC
  Administered 2012-01-08: 500 mL via INTRAVENOUS

## 2012-01-08 MED ORDER — SODIUM CHLORIDE 0.45 % IV SOLN
INTRAVENOUS | Status: DC
  Administered 2012-01-08 – 2012-01-11 (×6): via INTRAVENOUS
  Administered 2012-01-12: 75 mL/h via INTRAVENOUS
  Administered 2012-01-13 – 2012-01-14 (×2): via INTRAVENOUS

## 2012-01-08 MED ORDER — LISINOPRIL 10 MG PO TABS
10.0000 mg | ORAL_TABLET | Freq: Two times a day (BID) | ORAL | Status: DC
  Administered 2012-01-11 (×2): 10 mg via ORAL
  Filled 2012-01-08 (×9): qty 1

## 2012-01-08 MED ORDER — SODIUM CHLORIDE 0.9 % IV SOLN: 500.0000 mg | Freq: Two times a day (BID) | INTRAVENOUS | Status: DC

## 2012-01-08 MED ORDER — SODIUM CHLORIDE 0.9 % IV SOLN
1000.0000 mg | Freq: Two times a day (BID) | INTRAVENOUS | Status: AC
  Administered 2012-01-08: 1000 mg via INTRAVENOUS
  Filled 2012-01-08 (×2): qty 10

## 2012-01-08 MED ORDER — CITALOPRAM HYDROBROMIDE 10 MG PO TABS
10.0000 mg | ORAL_TABLET | Freq: Every day | ORAL | Status: DC
  Administered 2012-01-08 – 2012-02-12 (×36): 10 mg via ORAL
  Filled 2012-01-08 (×41): qty 1

## 2012-01-08 NOTE — Progress Notes (Signed)
Occupational Therapy Session Note  Patient Details  Name: Collin Diaz MRN: [id number] Date of Birth: [birth year]  Today's Date: 01/08/2012 Time: 1000-1100 Time Calculation (min): 60 min  Short Term Goals: Week 1:  OT Short Term Goal 1 (Week 1): Pt will maintain static sitting for 5 mins with close supervision in preparation for selfcare tasks. OT Short Term Goal 2 (Week 1): Pt will perform LB bathing sit to stand with Total +2 (pt 40%). OT Short Term Goal 3 (Week 1): Pt will perfrom UB dressing in supportive sitting with min facilitation. OT Short Term Goal 4 (Week 1): Pt will perform supine to sit with max assist in preparation for selfcare tasks. OT Short Term Goal 5 (Week 1): Pt will perform toilet transfers squat pivot with max assist.  Skilled Therapeutic Interventions/Progress Updates:    Performed bathing and dressing sit to stand at the sink.  Pt unable to maintain sitting balance out of the back of the chair while working.  Requires max instructional cues to initiate bathing tasks, step by step.  Total assist to integrate the LUE for washing the right side and arm.  Pt at times would close his eyes but would open them to verbal instruction to do so.  Performed LB bathing sit to stand with total +2 (pt 30%) for sit to stand and to maintain standing.  Unable to achieve full upright trunk in standing.  Exhibit forward trunk flexion.  During dressing pt needed max demonstrational cueing to attempt hemi dressing techniques and needed assistance with all parts  Therapy Documentation Precautions:  Precautions Precautions: Fall Precaution Comments: LUE and LE hemiparesis Restrictions Weight Bearing Restrictions: No  Pain: Pain Assessment Pain Assessment: No/denies pain ADL: See FIM for current functional status  Therapy/Group: Individual Therapy  Joshuan Bolander OTR/L  01/08/2012, 12:27 PM

## 2012-01-08 NOTE — Progress Notes (Addendum)
Asked to assist with patient with decreased LOC and decreased BP.  On arrival patient supine in bed - cool and dry.  Arouses to loud voice and sternal rub - turns to voice - purposeful with right side - eyes midline - staff reports at 1345 sitting in chair at baseline - at around 230 was found slumped over and unresponsive - placed in bed - bp low - IV started and saline (1/2 Normal) per PA Pam Love order started.  BP now 78/48 manual placed on monitor HR 107 - RR24-28 regular O2 sats 98% on 2 liter nasal cannula.    SR- 12 lead done - fluid bolus increased to 999 cc/hr.  BP at 1520 92/54 manual.  HR 87.  Labs reviewed - last set on 9/19 - stat labs drawn PTA.  Patient incontinent at baseline - condom cath present scant urine present - no urine output today per RN.  1535  BP 98/64 HR 87 RR 22 O2 sat 98%.  1545  To CT scan.  Keppra hung per order.  Handoff to Bayard Hugger RN in CT scan.  Will follow as needed.

## 2012-01-08 NOTE — Progress Notes (Signed)
Called to room by OT at 1415.  Patient sitting in the chair unresponsive and hard to arouse.  BP 74/48, HR 9, unable to get O2 Sat.  Assist patient back to bed, unable to read BP manually.  Delle Reining, PA notified right away, the following were ordered:   EKG order-result NSR, .45% NS 500 NS bolus, then 100cc/hr continues, CT-stat, labs, EEG, Keppra IVPB stat.  VS recheck at 1440 BP 89/68, HR 93, Temp 98.5, O2 Sat 100% at 2L [place], blood glucose 153.  Rapid response nurse notified and came to the floor.  Wife contacted  by Chana Bode at [phone], and stated she will be in to see the patient this after noon.  PA order to keep monitor fluid and VS as long as patient is arousable.  Will continue to monitor patient.

## 2012-01-08 NOTE — Progress Notes (Signed)
Patient with unresponsive episode and hypotension.  He was hard to arouse but twitching of LUE noted with drooling.  Hard to get BP but HR appears NSR.  Placed in trendelenburg and BP rechecked at 84/60. Hypoxic with O2 at 86%. Patient with garbled, dysarthric speech and withdraws to pain on right.  Fluid bolus ordered.  Labs ordered for follow up on acute renal failure in setting of dysphagia/pudding liquids.  CT head ordered to rule out extension of bleed.  EEG ordered. BS @ 153.   Discussed patient with Dr. Roseanne Reno (neurology) who felt that patient likely with seizure activity and to load patient with IV keppra 1000mg  then maintain on 500mg  bid.  Orders written. Patient more alert on re-exam in 15 mins.  Still slow to respond but nodding Y/N appropriately.

## 2012-01-08 NOTE — Progress Notes (Signed)
Occupational Therapy Session Note  Patient Details  Name: Collin Diaz MRN: [id number] Date of Birth: 11/16/1955  Today's Date: 01/08/2012 Time: 7829-5621 Time Calculation (min): 18 min   Skilled Therapeutic Interventions/Progress Updates:    Pt with limited OT secondary to change in medical status. (SEE OT NOTE EARLIER)  Pt total assist +2(pt 0%) for squat pivot transfer back to bed from wheelchair.  Pt required total assist to wipe his face and was unresponsive to verbal questions or commands to open his eyes or attempt to wash his face. Once returned to bed pt still not responding consistently to stimuli.  Nursing and PAs in room.   Therapy Documentation Precautions:  Precautions Precautions: Fall Precaution Comments: LUE and LE hemiparesis Restrictions Weight Bearing Restrictions: No General: General Amount of Missed OT Time (min): 15 Minutes Missed Time Reason: CT/MRI Vital Signs: Therapy Vitals Pulse Rate: 91  BP: 74/48 mmHg Patient Position, if appropriate: Sitting Pain: Pain Assessment Pain Assessment: Faces Faces Pain Scale: No hurt ADL:  See FIM for current functional status  Therapy/Group: Individual Therapy  Eleana Tocco OTR/L 01/08/2012, 3:57 PM

## 2012-01-08 NOTE — Progress Notes (Signed)
Patient more alert and responsive. Able to lift his right hand and scratch his nose. Eyes wide open to loud noise then close again. Keppra

## 2012-01-08 NOTE — Progress Notes (Signed)
Speech Language Pathology Daily Session Note  Patient Details  Name: Collin Diaz MRN: [id number] Date of Birth: 1955-11-23  Today's Date: 01/08/2012 Time: 4098-1191 Time Calculation (min): 40 min  Short Term Goals: Week 1: SLP Short Term Goal 1 (Week 1): Pt will utilize respiratory support sufficient to sustain phonation for 6-10 seconds with mod cues. SLP Short Term Goal 1 - Progress (Week 1): Progressing toward goal SLP Short Term Goal 2 (Week 1): Pt will complete effortful swallow in combination with towel tuck in order to improve hyolaryngeal elevation with mod cues. SLP Short Term Goal 2 - Progress (Week 1): Progressing toward goal SLP Short Term Goal 3 (Week 1): Pt will tolerate dysphagia 1 diet with pudding thick liquids with mod assist for precautions.  SLP Short Term Goal 3 - Progress (Week 1): Progressing toward goal SLP Short Term Goal 4 (Week 1): Pt will produce 1-2 word utterances with max assist to increase intelligibility.   SLP Short Term Goal 4 - Progress (Week 1): Progressing toward goal  Skilled Therapeutic Interventions: Treatment session focused on addressing breath support during phonation and swallowing.  SLP facilitated session with 2+ assist to sit edge of bed and max assist verbal, visual and tactile cues to perform diaphragmatic breathing in coordidation iwth phonation which produced 2 second increments of phonation.  SLP also facilitated session with min assist verbal cues to pace self feeding and max assist cues to self monitor and compensate for anterior loss of bolus.     FIM:  Comprehension Comprehension Mode: Auditory Comprehension: 2-Understands basic 25 - 49% of the time/requires cueing 51 - 75% of the time Expression Expression Mode: Verbal Expression: 1-Expresses basis less than 25% of the time/requires cueing greater than 75% of the time. Social Interaction Social Interaction: 2-Interacts appropriately 25 - 49% of time - Needs frequent  redirection. Problem Solving Problem Solving: 2-Solves basic 25 - 49% of the time - needs direction more than half the time to initiate, plan or complete simple activities Memory Memory: 2-Recognizes or recalls 25 - 49% of the time/requires cueing 51 - 75% of the time FIM - Eating Eating Activity: 4: Helper occasionally scoops food on utensil;4: Helper checks for pocketed food  Pain Pain Assessment Pain Assessment: No/denies pain  Therapy/Group: Individual Therapy  Charlane Ferretti., CCC-SLP [phone]  Collin Diaz 01/08/2012, 11:19 AM

## 2012-01-08 NOTE — Progress Notes (Signed)
Physical Therapy Note  Patient Details  Name: Collin Diaz MRN: [id number] Date of Birth: December 10, 1955 Today's Date: 01/08/2012  Patient missed 60 minutes of pm PT session secondary to off the floor for CT scan to evaluation for extension of CVA.   [name] 01/08/2012, 3:52 PM

## 2012-01-08 NOTE — Progress Notes (Signed)
Patient ID: Collin Diaz, male   DOB: [birth year], 56 y.o.   MRN: [id number] HPI: Collin Diaz is a 56 y.o. 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 Has history of poor controlled BP and had not had meds filled recently. Admitted 12/30/2011 with weakness, slurring of speech with coughing/chocking and blood pressure 254/143. Patient placed on Cardene drip. Cranial CT and imaging revealed right thalamus and basal ganglia hemorrhage with intraventricular hemorrhage and cytotoxic cerebral edema with right to left midline shift. Evaluated by neurology services with tight blood pressure control with SBP<150. Started on 3% saline drip with goal of 155-160 Na to decrease cerebral edema. Noted dysphagia currently on a dysphagia 1 diet   Subjective/Complaints: Flat affect minimal verbal output No c/os Review of Systems  Unable to perform ROS: medical condition  Constitutional: Positive for malaise/fatigue.  Neurological: Positive for focal weakness.     Objective: Vital Signs: Blood pressure 122/87, pulse 80, temperature 98.1 F (36.7 C), temperature source Oral, resp. rate 18, SpO2 95.00%. No results found. No results found for this or any previous visit (from the past 72 hour(s)).   HEENT: normal Cardio: RRR Resp: CTA B/L GI: BS positive Extremity:  No Edema Skin:   Intact Neuro: Flat, Cranial Nerve II-XII normal, Abnormal Sensory reduced lt touch on L, Abnormal Motor o/5 on L side, Abnormal FMC Tone  Hypotonia, Dysarthric and Aphasic Musc/Skel:  Swelling Left hand  Assessment/Plan: 1. Functional deficits secondary to R thalamic hemorrhage which require 3+ hours per day of interdisciplinary therapy in a comprehensive inpatient rehab setting. Physiatrist is providing close team supervision and 24 hour management of active medical problems listed  below. Physiatrist and rehab team continue to assess barriers to discharge/monitor patient progress toward functional and medical goals. FIM: FIM - Bathing Bathing Steps Patient Completed: Front perineal area Bathing: 2: Max-Patient completes 3-4 34f 10 parts or 25-49%  FIM - Upper Body Dressing/Undressing Upper body dressing/undressing steps patient completed: Put head through opening of pull over shirt/dress Upper body dressing/undressing: 1: Total-Patient completed less than 25% of tasks FIM - Lower Body Dressing/Undressing Lower body dressing/undressing: 1: Total-Patient completed less than 25% of tasks  FIM - Toileting Toileting: 1: Two helpers     FIM - Architectural technologist Transfer: 1: Two helpers  FIM - Locomotion: Wheelchair Distance: 30; with total verbal, tactile and visual cues for use of RUE and RLE for propulsion in controlled environment secondary to impaired attention, initiation and sequencing Locomotion: Wheelchair: 1: Total Assistance/staff pushes wheelchair (Pt<25%) FIM - Locomotion: Ambulation Locomotion: Ambulation: 0: Activity did not occur  Comprehension Comprehension Mode: Auditory Comprehension: 2-Understands basic 25 - 49% of the time/requires cueing 51 - 75% of the time  Expression Expression Mode: Verbal Expression: 1-Expresses basis less than 25% of the time/requires cueing greater than 75% of the time.  Social Interaction Social Interaction: 2-Interacts appropriately 25 - 49% of time - Needs frequent redirection.  Problem Solving Problem Solving: 2-Solves basic 25 - 49% of the time - needs direction more than half the time to initiate, plan or complete simple activities  Memory Memory: 2-Recognizes or recalls 25 - 49% of the time/requires cueing 51 - 75% of the time   Medical Problem List and Plan:  1. DVT Prophylaxis/Anticoagulation: Pharmaceutical: Lovenox  2. Pain Management:  tylenol  3. Mood: begin low dose celexa, abilify already initiated,  but change to HS dosing. Apparently he had been using this as an outpt, but it's unclear how long, why , or if he had been compliant with usage.  4. Neuropsych: This patient is not capable of making decisions on his/her own behalf Reduced intiation will increase methylphenidate 5. Controlled Hypernatremia: in order to control cerebral edema   6. HTN: HCTZ, cardura, lisinopril  -monitor and adjust as indicated  7. Dysphagia: D1, pudding thick liquids  -ivf as above will reduce rate -aggressive oral hygiene as he has poor oral-motor control   LOS (Days) 5 A FACE TO FACE EVALUATION WAS PERFORMED  Mathew Postiglione E 01/08/2012, 7:32 AM

## 2012-01-08 NOTE — Progress Notes (Signed)
Patient remains decreased LOC. Turns eyes open  midline to loud voice. B/P 100/61, HR 93 ,O2 sat 98%, R 24, Temp. 98.3%.Notify charge nurse for sudden change of LOC. Patient unresponsive and remains lethargic except to loud noise and painful stimuli.Called rapid response. Deatra Ina , PA notified.Verbal orders: to continue to monitor frequently. On 2 Liters of O2 via nasal cannula. Condom cath in place with no urine output. Continuous 45 ml Sodium Chloride 100 ml/hr.Patient's spouse at bedside. Monitored frequently.

## 2012-01-08 NOTE — Progress Notes (Signed)
Returned patient from CT scan with portable cardiac monitor and oxygen.  Patient remains unresponsive except to painful stimuli.  Will arouse briefly with limited speech effort.  Placed on dynamap with q15 minute vital signs,  PA Pam Love updated.  No further orders at this time.

## 2012-01-08 NOTE — Progress Notes (Signed)
Social Work Patient ID: Collin Diaz, male   DOB: 06/19/1955, 56 y.o.   MRN: [id number] Pt has now applied for SSD and Medicaid last Friday 9/20.  Wife reports issues with her health and has questions regarding food stamps. She will follow up with medicaid worker and go to DSS if needed.

## 2012-01-08 NOTE — Progress Notes (Signed)
OT Treatment Note  OT Treatment time 14:17- 14:35 Time:  18 mins  Visited pt for scheduled OT treatment at 14:15.  Noted pt sitting in wheelchair with head down and drooling.  Would not respond to verbal or physical command.  Pt with occasional eye opening but unable to maintain or sustain.  Nursing notified of situation and BP checked in sitting with automatic cuff.  BP 74/48 sitting, HR 91 BPM.  Unable to obtain pulse ox reading.  Returned pt to bed and notified PA.  Pt missed 15 mins of pm session secondary to medical status change.  Will re-attempt therapies as MD allows.  Perrin Maltese  OTR/L

## 2012-01-09 ENCOUNTER — Inpatient Hospital Stay (HOSPITAL_COMMUNITY): Payer: Self-pay | Admitting: Physical Therapy

## 2012-01-09 ENCOUNTER — Inpatient Hospital Stay (HOSPITAL_COMMUNITY): Payer: Medicaid Other

## 2012-01-09 ENCOUNTER — Inpatient Hospital Stay (HOSPITAL_COMMUNITY): Payer: Medicaid Other | Admitting: Occupational Therapy

## 2012-01-09 ENCOUNTER — Inpatient Hospital Stay (HOSPITAL_COMMUNITY): Payer: Self-pay | Admitting: Occupational Therapy

## 2012-01-09 DIAGNOSIS — R55 Syncope and collapse: Secondary | ICD-10-CM | POA: Diagnosis present

## 2012-01-09 LAB — BASIC METABOLIC PANEL
BUN: 64 mg/dL — ABNORMAL HIGH (ref 6–23)
Creatinine, Ser: 3.48 mg/dL — ABNORMAL HIGH (ref 0.50–1.35)
GFR calc Af Amer: 21 mL/min — ABNORMAL LOW (ref >90)
GFR calc non Af Amer: 18 mL/min — ABNORMAL LOW (ref >90)
Glucose, Bld: 117 mg/dL — ABNORMAL HIGH (ref 70–99)

## 2012-01-09 MED ORDER — DOXAZOSIN MESYLATE 2 MG PO TABS
2.0000 mg | ORAL_TABLET | Freq: Every day | ORAL | Status: DC
  Administered 2012-01-09 – 2012-01-18 (×10): 2 mg via ORAL
  Filled 2012-01-09 (×12): qty 1

## 2012-01-09 NOTE — Progress Notes (Signed)
Called to assess patient who had been seen earlier on in the day by day shift rapid response nurse, Debbie. Patient remains responsive to loud voice and sternal rub and purposeful on right side. Vital signs stable, patient has not changed since earlier assessment by rapid response RN or Bayard Hugger RN. Clarified with bedside RN if patient's neurological status had improved and then worsened, she said this was not the case. The patient's neurological exam had been the same since around 3pm in the afternoon. CT of head was obtained earlier on in the evening. PA updated on patient's condition. Will continue to monitor, advised bedside RN to call if needed

## 2012-01-09 NOTE — Progress Notes (Signed)
Returned from [employer] without procedure,, reported pt became unresponsive and ? Need to call RRT. Pt very drowsy but arousable with stimulation and voice. Mumbles response and turns head to voice and stimuli.  Foot of bed elevated after vital signs checked.  Therapy to proceed with B+D as tolerated. No other change in condition from previous note.  PT has been this way for past several days; MD aware and monitoring. Pamelia Hoit

## 2012-01-09 NOTE — Progress Notes (Signed)
Physical Therapy Note  Patient Details  Name: Collin Diaz MRN: [id number] Date of Birth: [birth year] Today's Date: 01/09/2012  Patient undergoing EEG; unable to participate; increased lethargy, unresponsiveness secondary to post seizure activity per RN report.  Will f/u tomorrow   [name] 01/09/2012, 2:20 PM

## 2012-01-09 NOTE — Progress Notes (Signed)
Patient more alert and responsive. Able to lift his right hand and scratch his nose. Eyes wide open to loud voice.Patient's spouse stated patient wants to eat .Keppra 500 mg on Sodium Chloride 0.9% 100 ml  IVPB given. Abdomen distended and was very tight to touch. Bladder scan with 811 ml at 2345 pm.NT ,Niecy performed an intermittent straight catheter  with 950 ml at 0000. Patient tolerated well. Bladder scan again with 27 ml. After few minutes  patient decreased LOC again. Patient became unresponsive and lethargic but  eyes wide open to loud noise and sternal rub.Charge nurse notified. Continuous 0.45 % ml Sodium Chloride 100 ml/hr infusing well via right antecubital. B/P 96/68. CBG 115. Will continue to monitor frequently.

## 2012-01-09 NOTE — Progress Notes (Addendum)
Patient ID: Collin Diaz, male   DOB: November 18, 1955, 56 y.o.   MRN: [id number] HPI: Collin Diaz is a 56 y.o. 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 Has history of poor controlled BP and had not had meds filled recently. Admitted 12/30/2011 with weakness, slurring of speech with coughing/chocking and blood pressure 254/143. Patient placed on Cardene drip. Cranial CT and imaging revealed right thalamus and basal ganglia hemorrhage with intraventricular hemorrhage and cytotoxic cerebral edema with right to left midline shift. Evaluated by neurology services with tight blood pressure control with SBP<150. Started on 3% saline drip with goal of 155-160 Na to decrease cerebral edema. Noted dysphagia currently on a dysphagia 1 diet   Subjective/Complaints: Flat affect minimal verbal output No c/os Review of Systems  Unable to perform ROS: medical condition  Constitutional: Positive for malaise/fatigue.  Neurological: Positive for focal weakness.     Objective: Vital Signs: Blood pressure 91/63, pulse 88, temperature 97.6 F (36.4 C), temperature source Oral, resp. rate 20, SpO2 99.00%. Ct Head Wo Contrast  01/08/2012  *RADIOLOGY REPORT*  Clinical Data: Follow-up thalamic bleed.  Unresponsive.  CT HEAD WITHOUT CONTRAST  Technique:  Contiguous axial images were obtained from the base of the skull through the vertex without contrast.  Comparison: 01/02/2012  Findings: Right thalamic and basal ganglia bleed again noted, essentially unchanged in overall size, measuring 2.3 cm in greatest diameter.  Evolutionary changes present within the area of hemorrhage.  No new areas of hemorrhage.  Extensive chronic small vessel disease.  No hydrocephalus.  Stable mass effect on the right lateral ventricle and third ventricle.  Previously seen intraventricular hemorrhage again  noted in the posterior horn of the left lateral ventricle, but not seen in the right lateral ventricle as seen previously.  IMPRESSION: Essentially stable size of the right thalamic/basal ganglia bleed. Slight evolutionary changes of the blood products.  Stable mass effect on the right lateral ventricle and third ventricle.  Decreasing intraventricular blood.   Original Report Authenticated By: Cyndie Chime, M.D.    Results for orders placed during the hospital encounter of 01/03/12 (from the past 72 hour(s))  BASIC METABOLIC PANEL     Status: Abnormal   Collection Time   01/08/12  2:44 PM      Component Value Range Comment   Sodium 146 (*) 135 - 145 mEq/L    Potassium 4.9  3.5 - 5.1 mEq/L    Chloride 108  96 - 112 mEq/L    CO2 22  19 - 32 mEq/L    Glucose, Bld 184 (*) 70 - 99 mg/dL    BUN 53 (*) 6 - 23 mg/dL    Creatinine, Ser 4.09 (*) 0.50 - 1.35 mg/dL    Calcium 9.9  8.4 - 81.1 mg/dL    GFR calc non Af Amer 18 (*) >90 mL/min    GFR calc Af Amer 21 (*) >90 mL/min   CBC     Status: Normal   Collection Time   01/08/12  2:44 PM      Component Value Range Comment   WBC 6.0  4.0 - 10.5 K/uL    RBC 4.38  4.22 - 5.81 MIL/uL    Hemoglobin 13.2  13.0 - 17.0 g/dL    HCT 91.4  78.2 - 95.6 %    MCV 90.9  78.0 - 100.0 fL    MCH 30.1  26.0 -  34.0 pg    MCHC 33.2  30.0 - 36.0 g/dL    RDW 16.1  09.6 - 04.5 %    Platelets 150  150 - 400 K/uL   GLUCOSE, CAPILLARY     Status: Abnormal   Collection Time   01/08/12  3:09 PM      Component Value Range Comment   Glucose-Capillary 153 (*) 70 - 99 mg/dL    Comment 1 Documented in Chart     GLUCOSE, CAPILLARY     Status: Abnormal   Collection Time   01/09/12 12:46 AM      Component Value Range Comment   Glucose-Capillary 115 (*) 70 - 99 mg/dL   BASIC METABOLIC PANEL     Status: Abnormal   Collection Time   01/09/12  6:00 AM      Component Value Range Comment   Sodium 143  135 - 145 mEq/L    Potassium 4.6  3.5 - 5.1 mEq/L    Chloride 109  96 - 112  mEq/L    CO2 21  19 - 32 mEq/L    Glucose, Bld 117 (*) 70 - 99 mg/dL    BUN 64 (*) 6 - 23 mg/dL    Creatinine, Ser 4.09 (*) 0.50 - 1.35 mg/dL    Calcium 9.1  8.4 - 81.1 mg/dL    GFR calc non Af Amer 18 (*) >90 mL/min    GFR calc Af Amer 21 (*) >90 mL/min      HEENT: normal Cardio: RRR Resp: CTA B/L GI: BS positive Extremity:  No Edema Skin:   Intact Neuro: Flat, Cranial Nerve II-XII normal, Abnormal Sensory reduced lt touch on L, Abnormal Motor o/5 on L side, Abnormal FMC Tone  Hypotonia, Dysarthric and Aphasic Musc/Skel:  Swelling Left hand NIHSS 12 (11 on initial neuro consult 9/14) Assessment/Plan: 1. Functional deficits secondary to R thalamic hemorrhage which require 3+ hours per day of interdisciplinary therapy in a comprehensive inpatient rehab setting. Physiatrist is providing close team supervision and 24 hour management of active medical problems listed below. Physiatrist and rehab team continue to assess barriers to discharge/monitor patient progress toward functional and medical goals. FIM: FIM - Bathing Bathing Steps Patient Completed: Chest;Left Arm;Abdomen;Right upper leg;Left upper leg Bathing: 3: Mod-Patient completes 5-7 62f 10 parts or 50-74%  FIM - Upper Body Dressing/Undressing Upper body dressing/undressing steps patient completed: Put head through opening of pull over shirt/dress Upper body dressing/undressing: 1: Total-Patient completed less than 25% of tasks FIM - Lower Body Dressing/Undressing Lower body dressing/undressing: 1: Two helpers  FIM - Toileting Toileting: 1: Two helpers     FIM - Architectural technologist Transfer: 1: Two helpers  FIM - Locomotion: Wheelchair Distance: 30; with total verbal, tactile and visual cues for use of RUE and RLE for propulsion in controlled environment secondary to impaired attention, initiation and sequencing Locomotion: Wheelchair: 1: Total  Assistance/staff pushes wheelchair (Pt<25%) FIM - Locomotion: Ambulation Locomotion: Ambulation: 0: Activity did not occur  Comprehension Comprehension Mode: Auditory Comprehension: 1-Understands basic less than 25% of the time/requires cueing 75% of the time  Expression Expression Mode: Verbal Expression: 1-Expresses basis less than 25% of the time/requires cueing greater than 75% of the time.  Social Interaction Social Interaction: 1-Interacts appropriately less than 25% of the time. May be withdrawn or combative.  Problem Solving Problem Solving: 1-Solves basic less than 25% of the time - needs direction nearly all the time or does not effectively solve  problems and may need a restraint for safety  Memory Memory: 1-Recognizes or recalls less than 25% of the time/requires cueing greater than 75% of the time   Medical Problem List and Plan:  1. DVT Prophylaxis/Anticoagulation: Pharmaceutical: Lovenox  2. Pain Management: tylenol  3. Mood: begin low dose celexa, abilify already initiated, but change to HS dosing. Apparently he had been using this as an outpt, but it's unclear how long, why , or if he had been compliant with usage.  4. Neuropsych: This patient is not capable of making decisions on his/her own behalf Reduced intiation  Methylphenidate-d/c if recurrent seizure 5. Controlled Hypernatremia: in order to control cerebral edema   6. HTN: HCTZ, cardura, lisinopril  -monitor and adjust as indicated  7. Dysphagia: D1, pudding thick liquids  -ivf continuous monitor BMET-aggressive oral hygiene as he has poor oral-motor control 8.  Post stoke seizure d/o  LOS (Days) 6 A FACE TO FACE EVALUATION WAS PERFORMED  Wrenley Sayed E 01/09/2012, 7:32 AM

## 2012-01-09 NOTE — Progress Notes (Signed)
Occupational Therapy Session Note  Patient Details  Name: Collin Diaz MRN: [id number] Date of Birth: March 14, 1956  Today's Date: 01/09/2012 Time: 1030-1050 Time Calculation (min): 20 min  Short Term Goals: Week 1:  OT Short Term Goal 1 (Week 1): Pt will maintain static sitting for 5 mins with close supervision in preparation for selfcare tasks. OT Short Term Goal 2 (Week 1): Pt will perform LB bathing sit to stand with Total +2 (pt 40%). OT Short Term Goal 3 (Week 1): Pt will perfrom UB dressing in supportive sitting with min facilitation. OT Short Term Goal 4 (Week 1): Pt will perform supine to sit with max assist in preparation for selfcare tasks. OT Short Term Goal 5 (Week 1): Pt will perform toilet transfers squat pivot with max assist.  Skilled Therapeutic Interventions/Progress Updates:    1) Pt off unit and missed first 30 mins of session.  Upon entering room, pt with decreased arousal requiring tactile stimuli and loud voice with minimal response.  Pt however did track the RN's movements with his eyes and was able to lift Rt hand to wash abdomen and don Rt sleeve of gown.  Pt required frequent max verbal and tactile cues to initiate participation, however treatment session terminated as pt not therapeutic at this time.  RN notified.  Pt missed 40 mins skilled therapy.  2) Pt unable to participate secondary to increased lethargy and inability to remain alert/aroused.  Attempted tactile stimuli and loud voice with minimal response.  Spoke with RN who reports ?seizure activity.  Will follow up tomorrow.  Pt missed 30 mins skilled therapy.  Therapy Documentation Precautions:  Precautions Precautions: Fall Precaution Comments: LUE and LE hemiparesis Restrictions Weight Bearing Restrictions: No General: General Amount of Missed OT Time (min): 70 Minutes Vital Signs: Therapy Vitals Pulse Rate: 75  Resp: 16  (16) BP: 111/64 mmHg Patient Position, if appropriate: Lying Oxygen  Therapy SpO2: 100 % O2 Device: Nasal cannula (2L) Pain:  Pt difficult to arouse and remain alert, therefore unable to convey pain.  However no signs of pt in pain.  See FIM for current functional status  Therapy/Group: Individual Therapy  Leonette Monarch 01/09/2012, 12:12 PM

## 2012-01-09 NOTE — Progress Notes (Signed)
Follow-up visit. Referred by [facility]. Pt not able to speak. Wife explained that after stroke pt was paralyzed on left side. Wife seemed not ready to visit just now, but she did ask for prayer. I joined hands with pt and wife and led in prayer.

## 2012-01-09 NOTE — Progress Notes (Signed)
Portable EEG completed

## 2012-01-10 ENCOUNTER — Inpatient Hospital Stay (HOSPITAL_COMMUNITY): Payer: Medicaid Other | Admitting: Physical Therapy

## 2012-01-10 ENCOUNTER — Inpatient Hospital Stay (HOSPITAL_COMMUNITY): Payer: Medicaid Other | Admitting: Speech Pathology

## 2012-01-10 ENCOUNTER — Inpatient Hospital Stay (HOSPITAL_COMMUNITY): Payer: Medicaid Other | Admitting: Occupational Therapy

## 2012-01-10 MED ORDER — ENSURE PUDDING PO PUDG
1.0000 | Freq: Three times a day (TID) | ORAL | Status: DC
  Administered 2012-01-10 – 2012-02-05 (×64): 1 via ORAL

## 2012-01-10 MED ORDER — LEVETIRACETAM 100 MG/ML PO SOLN
500.0000 mg | Freq: Two times a day (BID) | ORAL | Status: DC
  Administered 2012-01-10 – 2012-02-12 (×67): 500 mg via ORAL
  Filled 2012-01-10 (×73): qty 5

## 2012-01-10 MED ORDER — MEGESTROL ACETATE 400 MG/10ML PO SUSP
400.0000 mg | Freq: Two times a day (BID) | ORAL | Status: DC
  Administered 2012-01-10 – 2012-01-22 (×26): 400 mg via ORAL
  Filled 2012-01-10 (×27): qty 10

## 2012-01-10 NOTE — Progress Notes (Addendum)
Speech Language Pathology Daily Session Note  Patient Details  Name: Collin Diaz MRN: [id number] Date of Birth: 02-Jun-1955  Today's Date: 01/10/2012 Time: 4098-1191 Time Calculation (min): 40 min  Short Term Goals: Week 1: SLP Short Term Goal 1 (Week 1): Pt will utilize respiratory support sufficient to sustain phonation for 6-10 seconds with mod cues. SLP Short Term Goal 1 - Progress (Week 1): Progressing toward goal SLP Short Term Goal 2 (Week 1): Pt will complete effortful swallow in combination with towel tuck in order to improve hyolaryngeal elevation with mod cues. SLP Short Term Goal 2 - Progress (Week 1): Progressing toward goal SLP Short Term Goal 3 (Week 1): Pt will tolerate dysphagia 1 diet with pudding thick liquids with mod assist for precautions.  SLP Short Term Goal 3 - Progress (Week 1): Progressing toward goal SLP Short Term Goal 4 (Week 1): Pt will produce 1-2 word utterances with max assist to increase intelligibility.   SLP Short Term Goal 4 - Progress (Week 1): Progressing toward goal  Skilled Therapeutic Interventions: Treatment session focused on addressing arousal and dysphagia goals.  SLP facilitated session with total assist environmental, verbal and deep tactile stimuli for arousal for 30 second increments and total faded to max assist to follow 1 step commands and attempt verbal sequence of counting.  Patient required hand over hand faded to mod assist cues to consume pudding thick liquids and utilize a second swallow with 5 bites with no overt s/s of aspiration.  Current diet continues to be safe; however, patient must be awake and alert for p.o. And smaller more frequent p.o. May be best option at this time.       FIM:  Comprehension Comprehension Mode: Auditory Comprehension: 1-Understands basic less than 25% of the time/requires cueing 75% of the time Expression Expression Mode: Verbal Expression: 1-Expresses basis less than 25% of the time/requires  cueing greater than 75% of the time. Social Interaction Social Interaction: 1-Interacts appropriately less than 25% of the time. May be withdrawn or combative. Problem Solving Problem Solving: 1-Solves basic less than 25% of the time - needs direction nearly all the time or does not effectively solve problems and may need a restraint for safety Memory Memory: 2-Recognizes or recalls 25 - 49% of the time/requires cueing 51 - 75% of the time FIM - Eating Eating Activity: 4: Helper checks for pocketed food;4: Helper occasionally scoops food on utensil;4: Helper occasionally brings food to mouth;4: Help with picking up utensils;4: Help with managing cup/glass  Pain Pain Assessment Pain Assessment: No/denies pain  Therapy/Group: Individual Therapy  Charlane Ferretti., CCC-SLP [phone]  [name] 01/10/2012, 11:43 AM

## 2012-01-10 NOTE — Patient Care Conference (Signed)
Inpatient RehabilitationTeam Conference Note Date: 01/10/2012   Time: 10:45 AM    Patient Name: Collin Diaz      Medical Record Number: [id number]  Date of Birth: Nov 28, 1955 Sex: Male         Room/Bed: 4032/4032-01 Payor Info: Payor: MEDICAID PENDING  Plan: MEDICAID PENDING  Product Type: *No Product type*     Admitting Diagnosis: ICH  Admit Date/Time:  01/03/2012  4:50 PM Admission Comments: No comment available   Primary Diagnosis:  Thalamic hemorrhage Principal Problem: Thalamic hemorrhage  Patient Active Problem List   Diagnosis Date Noted  . Syncope 01/09/2012  . Induced hypernatremia 01/03/2012  . Obesity (BMI 30.0-34.9) 01/03/2012  . OSA (obstructive sleep apnea) 01/03/2012  . Renal insufficiency 01/03/2012  . Chronic ischemic left MCA stroke 01/03/2012  . Aphasia as late effect of cerebrovascular accident 01/03/2012  . Depression 01/03/2012  . 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 ALLERGIC RHINITIS 02/07/2007  . FACIAL RASH 02/07/2007  . GOUT NOS 08/18/2006  . Malignant hypertension 08/18/2006  . KIDNEY DISEASE, CHRONIC, STAGE V 08/18/2006    Expected Discharge Date:    Team Members Present: Physician: Dr. Claudette Laws Social Worker Present: Dossie Der, LCSW Nurse Present: Other (comment) Milly Jakob Rcom-RN) PT Present: [name], PT OT Present: Bretta Bang, Verlene Mayer, OT SLP Present: Fae Pippin, SLP Other (Discipline and Name): Charolette Child Coordinator     Current Status/Progress Goal Weekly Team Focus  Medical   Poor level of alertness  Improved level of alertness  Adjust medications,   Bowel/Bladder    no bowel movement this shift, I&O cath q8 hours due to urinary retention.  continent of bowel and bladder controlled with foley  continent of bowel and bladder   Swallow/Nutrition/ Hydration   Dys.1 textures and pudding thick liquids with max-total assist  least restrictive p.o. intake   increase arousal   ADL's   Monday mod assist  bathing, max assist grooming, total +2 dressing and transfers.  Tuesday unable to participate due to inability to remain aroused despite loud voices and tactile stimuli.  mod assist bathing, LB dsg, toilet and tub/shower transfer, supervision grooming and UB dsg  increased arousal to allow for participation   Mobility   total A  min-mod A overall w/c level; SNF?  re-evaluation of patient functional status; activity tolerance, transfers, balance   Communication   total assist   mod assist  increase arousal    Safety/Cognition/ Behavioral Observations  total assist  mod assist   increase arousal    Pain   n/a  n/a  n/a   Skin   n/a  n/a  n/a      *See Interdisciplinary Assessment and Plan and progress notes for long and short-term goals  Barriers to Discharge: Heavy physical assistance required    Possible Resolutions to Barriers:  Training caregiver versus SNF placement    Discharge Planning/Teaching Needs:  Posey Rea if wife planning to take pt home-will not commit to a discharge plan.      Team Discussion:  Arousal an issue not able to participate in therapies, goals downgraded to max level.  Adjusting meds. Urine retention.  Ritalin started. Wife here but at times sleeping in the chair, not paying attention  Revisions to Treatment Plan:  Team recommends NHP   Continued Need for Acute Rehabilitation Level of Care: The patient requires daily medical management by a physician with specialized training in physical medicine and rehabilitation for the following conditions: Daily direction of a multidisciplinary physical rehabilitation  program to ensure safe treatment while eliciting the highest outcome that is of practical value to the patient.: Yes Daily medical management of patient stability for increased activity during participation in an intensive rehabilitation regime.: Yes Daily analysis of laboratory values and/or radiology reports with any subsequent need for medication  adjustment of medical intervention for : Neurological problems  Collin Diaz, [name] 01/10/2012, 2:27 PM

## 2012-01-10 NOTE — Progress Notes (Signed)
Patient sitting up in the chair is more awake and interactive this afternoon. Patient continent of bowel and bladder today.  Used BSC void at 1145, PVR ; LBM 9/25 soft, form, medium.

## 2012-01-10 NOTE — Progress Notes (Signed)
Occupational Therapy Session Note  Patient Details  Name: Collin Diaz MRN: [id number] Date of Birth: June 07, 1955  Today's Date: 01/10/2012 Time: 1300-1330 Time Calculation (min): 30 min  Short Term Goals: Week 1:  OT Short Term Goal 1 (Week 1): Pt will maintain static sitting for 5 mins with close supervision in preparation for selfcare tasks. OT Short Term Goal 2 (Week 1): Pt will perform LB bathing sit to stand with Total +2 (pt 40%). OT Short Term Goal 3 (Week 1): Pt will perfrom UB dressing in supportive sitting with min facilitation. OT Short Term Goal 4 (Week 1): Pt will perform supine to sit with max assist in preparation for selfcare tasks. OT Short Term Goal 5 (Week 1): Pt will perform toilet transfers squat pivot with max assist.  Skilled Therapeutic Interventions/Progress Updates:    Pt seen for 1:1 OT with focus on sitting balance, alertness, and attention to task. Pt sitting up in w/c upon arrival with eyes open.  Pt with vocalizations that were difficult to understand, pt asking to phone his mother.  Pt attended to conversation with wife and mothers on phone and attempted to verbalize but speech was unintelligible.  Pt visibly upset an inability to clearly communicate.  Engaged in task in day room to focus on sustained attention, scanning, while completing a matching activity.  Pt able to sustain attention and complete tasks with min cues this session.  Therapy Documentation Precautions:  Precautions Precautions: Fall Precaution Comments: LUE and LE hemiparesis Restrictions Weight Bearing Restrictions: No Pain: Pain Assessment Pain Assessment: No/denies pain  See FIM for current functional status  Therapy/Group: Individual Therapy  Leonette Monarch 01/10/2012, 2:57 PM

## 2012-01-10 NOTE — Progress Notes (Signed)
Physical Therapy Weekly Progress Note  Patient Details  Name: Collin Diaz MRN: [id number] Date of Birth: 02/18/1956  Today's Date: 01/10/2012 Time: 1400-1500 Time Calculation (min): 60 min  Patient has met 0 of 5 short term goals.  Patient with limited progress this weekend secondary to two-three days of missed therapy session secondary to seizure activity and decreased arousal and ability to participate.  Patient is making very slow progress and secondary to anticipated increased need for physical assistance at D/C therapy team recommending SNF placement at this time.  Patient placed on 15 hours of therapy over 7 days to increase activity tolerance and ability to participate in therapy.    Patient continues to require total A for all mobility and demonstrates the following deficits: L hemiplegia, apraxia, impaired attention, awareness, initiation, decreased arousal with frequent episodes of lethargy and falling asleep, impaired postural control, balance and currently unable to stand or walk and therefore will continue to benefit from skilled PT intervention to enhance overall performance with activity tolerance, balance, postural control, ability to compensate for deficits, functional use of  left upper extremity and left lower extremity, attention and awareness.  Patient not progressing toward long term goals.  See goal revision..  Plan of care revisions: Recommending D/C to SNF.  PT Short Term Goals Week 1:  PT Short Term Goal 1 (Week 1): Patient will initiate 25% of the time for bed mobility training with use of UE support and max A PT Short Term Goal 1 - Progress (Week 1): Not met PT Short Term Goal 2 (Week 1): Patient will transfer bed <> w/c with max A and initiate 25% of the time PT Short Term Goal 2 - Progress (Week 1): Not met PT Short Term Goal 3 (Week 1): Patient will perform w/c mobility on unit x 50' with R hemi technique and 75% cues for initiation and sequencing PT Short Term  Goal 3 - Progress (Week 1): Not met PT Short Term Goal 4 (Week 1): Patient will tolerate dynamic sitting balance and postural control training activities without back support x 15 minutes with mod-max A PT Short Term Goal 4 - Progress (Week 1): Not met PT Short Term Goal 5 (Week 1): Patient will tolerate static standing in appropriate lift equipment for NMR to LLE, trunk control and pre-gait activities x 5 minutes PT Short Term Goal 5 - Progress (Week 1): Not met Week 2:  PT Short Term Goal 1 (Week 2): Patient will initiate 25% of the time for bed mobility training with use of UE support and max A PT Short Term Goal 2 (Week 2): Patient will transfer bed <> w/c with max A and initiate 25% of the time  PT Short Term Goal 3 (Week 2): Patient will perform w/c mobility on unit x 50' with R hemi technique and 75% cues for initiation and sequencing  PT Short Term Goal 4 (Week 2): Patient will tolerate dynamic sitting balance and postural control training activities without back support x 15 minutes with mod-max A PT Short Term Goal 5 (Week 2): Patient will tolerate static standing in appropriate lift equipment for NMR to LLE, trunk control and pre-gait activities x 5 minutes   Skilled Therapeutic Interventions/Progress Updates:   Patient more alert today; up in w/c following OT session; performed slideboard transfer w/c > mat to L side with total A on L side to maintain upright trunk and anterior lean to unweight hips with patient assisting 10% with RUE and RLE to scoot  buttocks along board with multiple scoots and use of counting for initiation.  Transfer back to w/c to R with multiple squat pivots with total A and patient assisting 10% through extension of RUE and LE to lift buttocks.    Sitting on mat performed attention to task training, sitting balance training initiation training with RLE soccer ball kicks with max A to assist with maintaining upright trunk posture and prevention of forward or posterior  LOB with LUE in extension and ER; patient had 5 minute episode of falling asleep and unable to arouse; patient suddenly awakened and refused to continue to kick the ball.  Changed to reaching task for horse shoes and tossing horseshoes at targets to L and R for visual tracking and reaching out of BOS to facilitate thoracic extension, upright trunk and increased attention and arousal.     Therapy Documentation Precautions:  Precautions Precautions: Fall Precaution Comments: LUE and LE hemiparesis Restrictions Weight Bearing Restrictions: No Vital Signs: Therapy Vitals Temp: 98.2 F (36.8 C) Temp src: Oral Pulse Rate: 54  Resp: 19  BP: 131/89 mmHg Patient Position, if appropriate: Sitting Oxygen Therapy SpO2: 98 % O2 Device: Nasal cannula Pain:  No c/o pain   See FIM for current functional status  Therapy/Group: Individual Therapy  [name] [facility] 01/10/2012, 4:16 PM

## 2012-01-10 NOTE — Progress Notes (Signed)
Occupational Therapy Session Note  Patient Details  Name: DEMARCO BACCI MRN: [id number] Date of Birth: Dec 18, 1955  Today's Date: 01/10/2012 Time: 1000-1100 Time Calculation (min): 60 min  Short Term Goals: Week 1:  OT Short Term Goal 1 (Week 1): Pt will maintain static sitting for 5 mins with close supervision in preparation for selfcare tasks. OT Short Term Goal 2 (Week 1): Pt will perform LB bathing sit to stand with Total +2 (pt 40%). OT Short Term Goal 3 (Week 1): Pt will perfrom UB dressing in supportive sitting with min facilitation. OT Short Term Goal 4 (Week 1): Pt will perform supine to sit with max assist in preparation for selfcare tasks. OT Short Term Goal 5 (Week 1): Pt will perform toilet transfers squat pivot with max assist. :     Skilled Therapeutic Interventions/Progress Updates:    Pt seen for B/D at a co-tx session with another OT with a focus on bed mobility, sitting balance, attention, initiation. Pt's wife present for the session.  Pt was somewhat more alert today and was able to engage more and tried to assist therapists with sidelying to sit and lifting his legs to have his socks and shoes donned.  He was able to sit on EOB with min to mod support (focused on sitting in midline and avoiding posterior lean) and transferred to w/c via sliding board with +2 and 20% patient effort.  Wife stated that he was fully independent prior to this CVA.  Therapy Documentation Precautions:  Precautions Precautions: Fall Precaution Comments: LUE and LE hemiparesis Restrictions Weight Bearing Restrictions: No  Pain: Pain Assessment Pain Assessment: No/denies pain Faces Pain Scale: Hurts a little bit Pain Intervention(s): Medication (See eMAR) (tylenol) ADL:  See FIM for current functional status  Therapy/Group: Individual Therapy  Jahleel Stroschein 01/10/2012, 11:30 AM

## 2012-01-10 NOTE — Progress Notes (Signed)
Patient ID: Collin Diaz, male   DOB: May 03, 1955, 56 y.o.   MRN: [id number] HPI: Collin Diaz is a 56 y.o. 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 Has history of poor controlled BP and had not had meds filled recently. Admitted 12/30/2011 with weakness, slurring of speech with coughing/chocking and blood pressure 254/143. Patient placed on Cardene drip. Cranial CT and imaging revealed right thalamus and basal ganglia hemorrhage with intraventricular hemorrhage and cytotoxic cerebral edema with right to left midline shift. Evaluated by neurology services with tight blood pressure control with SBP<150. Started on 3% saline drip with goal of 155-160 Na to decrease cerebral edema. Noted dysphagia currently on a dysphagia 1 diet   Subjective/Complaints: Flat affect minimal verbal output No c/os Review of Systems  Unable to perform ROS: medical condition  Constitutional: Positive for malaise/fatigue.  Neurological: Positive for focal weakness.     Objective: Vital Signs: Blood pressure 98/71, pulse 87, temperature 98.7 F (37.1 C), temperature source Oral, resp. rate 20, height 5' 8.9" (1.75 m), weight 104.6 kg (230 lb 9.6 oz), SpO2 99.00%. Ct Head Wo Contrast  01/08/2012  *RADIOLOGY REPORT*  Clinical Data: Follow-up thalamic bleed.  Unresponsive.  CT HEAD WITHOUT CONTRAST  Technique:  Contiguous axial images were obtained from the base of the skull through the vertex without contrast.  Comparison: 01/02/2012  Findings: Right thalamic and basal ganglia bleed again noted, essentially unchanged in overall size, measuring 2.3 cm in greatest diameter.  Evolutionary changes present within the area of hemorrhage.  No new areas of hemorrhage.  Extensive chronic small vessel disease.  No hydrocephalus.  Stable mass effect on the right lateral ventricle and third ventricle.   Previously seen intraventricular hemorrhage again noted in the posterior horn of the left lateral ventricle, but not seen in the right lateral ventricle as seen previously.  IMPRESSION: Essentially stable size of the right thalamic/basal ganglia bleed. Slight evolutionary changes of the blood products.  Stable mass effect on the right lateral ventricle and third ventricle.  Decreasing intraventricular blood.   Original Report Authenticated By: Cyndie Chime, M.D.    Results for orders placed during the hospital encounter of 01/03/12 (from the past 72 hour(s))  BASIC METABOLIC PANEL     Status: Abnormal   Collection Time   01/08/12  2:44 PM      Component Value Range Comment   Sodium 146 (*) 135 - 145 mEq/L    Potassium 4.9  3.5 - 5.1 mEq/L    Chloride 108  96 - 112 mEq/L    CO2 22  19 - 32 mEq/L    Glucose, Bld 184 (*) 70 - 99 mg/dL    BUN 53 (*) 6 - 23 mg/dL    Creatinine, Ser 4.09 (*) 0.50 - 1.35 mg/dL    Calcium 9.9  8.4 - 81.1 mg/dL    GFR calc non Af Amer 18 (*) >90 mL/min    GFR calc Af Amer 21 (*) >90 mL/min   CBC     Status: Normal   Collection Time   01/08/12  2:44 PM      Component Value Range Comment   WBC 6.0  4.0 - 10.5 K/uL    RBC 4.38  4.22 - 5.81 MIL/uL    Hemoglobin 13.2  13.0 - 17.0 g/dL    HCT 91.4  78.2 - 95.6 %    MCV 90.9  78.0 - 100.0 fL    MCH 30.1  26.0 - 34.0 pg    MCHC 33.2  30.0 - 36.0 g/dL    RDW 30.8  65.7 - 84.6 %    Platelets 150  150 - 400 K/uL   GLUCOSE, CAPILLARY     Status: Abnormal   Collection Time   01/08/12  3:09 PM      Component Value Range Comment   Glucose-Capillary 153 (*) 70 - 99 mg/dL    Comment 1 Documented in Chart     GLUCOSE, CAPILLARY     Status: Abnormal   Collection Time   01/09/12 12:46 AM      Component Value Range Comment   Glucose-Capillary 115 (*) 70 - 99 mg/dL   BASIC METABOLIC PANEL     Status: Abnormal   Collection Time   01/09/12  6:00 AM      Component Value Range Comment   Sodium 143  135 - 145 mEq/L     Potassium 4.6  3.5 - 5.1 mEq/L    Chloride 109  96 - 112 mEq/L    CO2 21  19 - 32 mEq/L    Glucose, Bld 117 (*) 70 - 99 mg/dL    BUN 64 (*) 6 - 23 mg/dL    Creatinine, Ser 9.62 (*) 0.50 - 1.35 mg/dL    Calcium 9.1  8.4 - 95.2 mg/dL    GFR calc non Af Amer 18 (*) >90 mL/min    GFR calc Af Amer 21 (*) >90 mL/min   GLUCOSE, CAPILLARY     Status: Normal   Collection Time   01/09/12 12:00 PM      Component Value Range Comment   Glucose-Capillary 94  70 - 99 mg/dL      HEENT: normal Cardio: RRR Resp: CTA B/L GI: BS positive Extremity:  No Edema Skin:   Intact Neuro: Flat, Cranial Nerve II-XII normal, Abnormal Sensory reduced lt touch on L, Abnormal Motor o/5 on L side, Abnormal FMC Tone  Hypotonia, Dysarthric and Aphasic Musc/Skel:  Swelling Left hand  Assessment/Plan: 1. Functional deficits secondary to R thalamic hemorrhage which require 3+ hours per day of interdisciplinary therapy in a comprehensive inpatient rehab setting. Physiatrist is providing close team supervision and 24 hour management of active medical problems listed below. Physiatrist and rehab team continue to assess barriers to discharge/monitor patient progress toward functional and medical goals. FIM: FIM - Bathing Bathing Steps Patient Completed: Chest;Abdomen Bathing: 1: Total-Patient completes 0-2 of 10 parts or less than 25%  FIM - Upper Body Dressing/Undressing Upper body dressing/undressing steps patient completed: Put head through opening of pull over shirt/dress Upper body dressing/undressing: 1: Total-Patient completed less than 25% of tasks FIM - Lower Body Dressing/Undressing Lower body dressing/undressing: 1: Two helpers  FIM - Toileting Toileting: 1: Two helpers     FIM - Architectural technologist Transfer: 1: Two helpers  FIM - Locomotion: Wheelchair Distance: 30; with total verbal, tactile and visual cues for use of RUE and RLE for  propulsion in controlled environment secondary to impaired attention, initiation and sequencing Locomotion: Wheelchair: 1: Total Assistance/staff pushes wheelchair (Pt<25%) FIM - Locomotion: Ambulation Locomotion: Ambulation: 0: Activity did not occur  Comprehension Comprehension Mode: Auditory Comprehension: 1-Understands basic less than 25% of the time/requires cueing 75% of the time  Expression Expression Mode: Verbal Expression: 1-Expresses basis less than 25% of the time/requires cueing greater than 75% of the time.  Social Interaction Social  Interaction: 1-Interacts appropriately less than 25% of the time. May be withdrawn or combative.  Problem Solving Problem Solving: 1-Solves basic less than 25% of the time - needs direction nearly all the time or does not effectively solve problems and may need a restraint for safety  Memory Memory: 2-Recognizes or recalls 25 - 49% of the time/requires cueing 51 - 75% of the time   Medical Problem List and Plan:  1. DVT Prophylaxis/Anticoagulation: Pharmaceutical: Lovenox  2. Pain Management: tylenol  3. Mood: begin low dose celexa, abilify already initiated, but change to HS dosing. Apparently he had been using this as an outpt, but it's unclear how long, why , or if he had been compliant with usage.  4. Neuropsych: This patient is not capable of making decisions on his/her own behalf Reduced intiation  Methylphenidate-d/c if recurrent seizure 5. Controlled Hypernatremia: in order to control cerebral edema   6. HTN: HCTZ, cardura, lisinopril  -monitor and adjust as indicated  7. Dysphagia: D1, pudding thick liquids -could not participate with MBS due to cognition -ivf continuous monitor BMET-aggressive oral hygiene as he has poor oral-motor control 8.  Post stoke seizure d/o  LOS (Days) 7 A FACE TO FACE EVALUATION WAS PERFORMED  [name],[name] 01/10/2012, 9:34 AM

## 2012-01-10 NOTE — Progress Notes (Signed)
INITIAL ADULT NUTRITION ASSESSMENT Date: 01/10/2012   Time: 1:33 PM  Reason for Assessment: Poor PO Intake  INTERVENTION: 1. Ensure Pudding po TID, each supplement provides 170 kcal and 4 grams of protein.  2. If intake does not improve, consider short term nutrition support to help meet patient's nutritional needs 3. RD to continue to follow nutrition care plan  DOCUMENTATION CODES Per approved criteria  -Obesity Unspecified   ASSESSMENT: Male 56 y.o.  Dx: Thalamic hemorrhage  Hx:  Past Medical History  Diagnosis Date  . Hypertension   . Stroke    No past surgical history on file.  Related Meds:     . antiseptic oral rinse  15 mL Mouth Rinse q12n4p  . ARIPiprazole  1 mg Oral QHS  . chlorhexidine  15 mL Mouth Rinse BID  . citalopram  10 mg Oral Daily  . doxazosin  2 mg Oral QHS  . enoxaparin  40 mg Subcutaneous Daily  . levETIRAcetam  500 mg Oral BID  . lisinopril  10 mg Oral BID  . megestrol  400 mg Oral BID  . methylphenidate  10 mg Oral BID WC  . DISCONTD: levetiracetam  500 mg Intravenous Q12H   Ht: 5' 8.9" (175 cm)  Wt: 230 lb 9.6 oz (104.6 kg)  Ideal Wt: 72.7 kg/160 lb % Ideal Wt: 144%  Wt Readings from Last 15 Encounters:  01/09/12 230 lb 9.6 oz (104.6 kg)  01/02/12 230 lb 9.6 oz (104.6 kg)  02/07/07 205 lb (92.987 kg)  Usual Wt: unable to state % Usual Wt: n/a  Body mass index is 34.15 kg/(m^2). Obese Class I  Food/Nutrition Related Hx: unable to obtain at this time  Labs:  CMP     Component Value Date/Time   NA 143 01/09/2012 0600   K 4.6 01/09/2012 0600   CL 109 01/09/2012 0600   CO2 21 01/09/2012 0600   GLUCOSE 117* 01/09/2012 0600   BUN 64* 01/09/2012 0600   CREATININE 3.48* 01/09/2012 0600   CALCIUM 9.1 01/09/2012 0600   PROT 6.9 01/04/2012 0606   ALBUMIN 3.2* 01/04/2012 0606   AST 23 01/04/2012 0606   ALT 29 01/04/2012 0606   ALKPHOS 90 01/04/2012 0606   BILITOT 0.5 01/04/2012 0606   GFRNONAA 18* 01/09/2012 0600   GFRAA 21* 01/09/2012 0600     CBG (last 3)   Basename 01/09/12 1200 01/09/12 0046 01/08/12 1509  GLUCAP 94 115* 153*   Phosphorus  Date/Time Value Range Status  12/31/2011  5:50 AM 2.8  2.3 - 4.6 mg/dL Final   Magnesium  Date/Time Value Range Status  12/31/2011  5:50 AM 1.9  1.5 - 2.5 mg/dL Final    Intake/Output Summary (Last 24 hours) at 01/10/12 1335 Last data filed at 01/10/12 1610  Gross per 24 hour  Intake   1160 ml  Output   1900 ml  Net   -740 ml  BM: 9/21  Diet Order: Dysphagia 1 with Pudding Thick Liquids  Supplements/Tube Feeding: none  IVF:    sodium chloride Last Rate: 100 mL/hr at 01/10/12 0409   Estimated Nutritional Needs:   Kcal: 1875 - 2075 kcal Protein: 80 - 90 grams Fluid: 2 - 2.2 liters daily  Admitted to rehab s/p CVA. Pt required rapid response on 9/23 per chart review. Attempted MBSS however could not participated 2/2 cognition. Pt with continuous IVF.  Wife confirms that pt is not eating well. Pt eating <50% of all meals.  Skin: No skin breakdown noted. Notable  labs: CBGs 117 - 184 Pertinent meds: megace Last BM: 9/21  NUTRITION DIAGNOSIS: -Inadequate oral intake r/t poor attention AEB poor meal completion.  MONITORING/EVALUATION(Goals): Goal: Pt to meet >/= 90% of their estimated nutrition needs Monitor: weight trends, lab trends, I/O's, PO intake, supplement tolerance  EDUCATION NEEDS: -No education needs identified at this time  Jarold Motto MS, RD, LDN Pager: [phone] After-hours pager: [phone]

## 2012-01-11 ENCOUNTER — Inpatient Hospital Stay (HOSPITAL_COMMUNITY): Payer: Medicaid Other | Admitting: Occupational Therapy

## 2012-01-11 ENCOUNTER — Inpatient Hospital Stay (HOSPITAL_COMMUNITY): Payer: Medicaid Other | Admitting: *Deleted

## 2012-01-11 DIAGNOSIS — Z5189 Encounter for other specified aftercare: Secondary | ICD-10-CM

## 2012-01-11 DIAGNOSIS — I619 Nontraumatic intracerebral hemorrhage, unspecified: Secondary | ICD-10-CM

## 2012-01-11 DIAGNOSIS — G8110 Spastic hemiplegia affecting unspecified side: Secondary | ICD-10-CM

## 2012-01-11 NOTE — Progress Notes (Signed)
Occupational Therapy Session Note  Patient Details  Name: Collin Diaz MRN: [id number] Date of Birth: 1956/01/28  Today's Date: 01/11/2012 Time: 1000-1100 Time Calculation (min): 60 min   Skilled Therapeutic Interventions/Progress Updates: Patient seen this AM for 1:1 OT session to address patient's ability to actively participate in his own self care tasks.  Patient minimally responsive in supine position.  Patient initially required moderate assistance to sit at edge of bed, but at times could sit with supervision.   Patient able to initiate each next step of task with prompting, but unable to follow through to completion, and/or terminate a task once completed.  Patient assisted to partial standing from elevated bed height and +2 assistance.  Patient needs continued focus on sustaining muscle activity once initiated.       Therapy Documentation Pain:  Patient winced with seated forward flexion.  When asked, he indicated right lower back / hip painful.  See FIM for current functional status  Therapy/Group: Individual Therapy  [name] 01/11/2012, 1:12 PM

## 2012-01-11 NOTE — Progress Notes (Signed)
Occupational Therapy Session Note  Patient Details  Name: Collin Diaz MRN: [id number] Date of Birth: 1956/02/20  Today's Date: 01/11/2012 Time: 4098-1191 Time Calculation (min): 30 min   Skilled Therapeutic Interventions/Progress Updates: Patient seen for OT/SLP co-treatment this pm to address level of arousal and motor activity during a feeding task.  Patient seated on mat with intermittent levels of physical assistance, to swallow pudding thickened water by teaspoon.  Patient with chewing reaction to water, and extremely delayed swallow.  Patient able at times to sit with supervision.  Patient seems motivated by activity of "drinking" water.  Patient demonstrates initiation of each motor task, but lacks ability to complete motor task.     Therapy Documentation Pain:  No report of pain   See FIM for current functional status  Therapy/Group: Co-Treatment  [name] 01/11/2012, 2:57 PM

## 2012-01-11 NOTE — Progress Notes (Signed)
Speech Language Pathology Daily Session Note  Patient Details  Name: BROOKE PAYES MRN: [id number] Date of Birth: [birth year]  Today's Date: 01/11/2012 Time: 1400-1415 Time Calculation (min): 15 min  Short Term Goals: Week 1: SLP Short Term Goal 1 (Week 1): Pt will utilize respiratory support sufficient to sustain phonation for 6-10 seconds with mod cues. SLP Short Term Goal 1 - Progress (Week 1): Progressing toward goal SLP Short Term Goal 2 (Week 1): Pt will complete effortful swallow in combination with towel tuck in order to improve hyolaryngeal elevation with mod cues. SLP Short Term Goal 2 - Progress (Week 1): Progressing toward goal SLP Short Term Goal 3 (Week 1): Pt will tolerate dysphagia 1 diet with pudding thick liquids with mod assist for precautions.  SLP Short Term Goal 3 - Progress (Week 1): Progressing toward goal SLP Short Term Goal 4 (Week 1): Pt will produce 1-2 word utterances with max assist to increase intelligibility.   SLP Short Term Goal 4 - Progress (Week 1): Progressing toward goal  Skilled Therapeutic Interventions: Patient seen for OT/SLP co-treatment to address level of arousal and motor activity during a feeding task.  OT facilitated session while patient seated on mat with intermittent levels of physical assistance, to swallow pudding thick water by teaspoon. SLP completed skilled observation and patient exhibited chewing reaction to thickened water, delayed pharyngeal swallow, reduced hyolaryngeal movement and anterior loss of greater than half of bolus. However, patient able at times to sit with supervision cues from OT, and. as a result, patient's arousal and participation during task increased.  Patient exhibited no overt s/s of aspiration during session.  Patient initiated a basic familiar task with total faded to max assist multimodal cuing from SLP.  Patient seemed motivated by activity of "drinking" water.    FIM:  Comprehension Comprehension Mode:  Auditory Comprehension: 3-Understands basic 50 - 74% of the time/requires cueing 25 - 50%  of the time Expression Expression Mode: Verbal Expression: 1-Expresses basis less than 25% of the time/requires cueing greater than 75% of the time. Social Interaction Social Interaction: 1-Interacts appropriately less than 25% of the time. May be withdrawn or combative. Problem Solving Problem Solving: 1-Solves basic less than 25% of the time - needs direction nearly all the time or does not effectively solve problems and may need a restraint for safety Memory Memory: 2-Recognizes or recalls 25 - 49% of the time/requires cueing 51 - 75% of the time FIM - Eating Eating Activity: 4: Helper occasionally scoops food on utensil;4: Helper checks for pocketed food  Pain Pain Assessment Pain Assessment: No/denies pain  Therapy/Group: Individual Therapy  Jackalyn Lombard, Conrad Airmont  Graduate Clinician Speech Language Pathology  Page, Joni Reining 01/11/2012, 6:02 PM  The above skilled treatment note has been reviewed and SLP is in agreement. Fae Pippin, M.A., CCC-SLP [phone]

## 2012-01-11 NOTE — Progress Notes (Signed)
Patient ID: Collin Diaz, male   DOB: 16-Jun-1955, 56 y.o.   MRN: [id number] HPI: Collin Diaz is a 56 y.o. 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 Has history of poor controlled BP and had not had meds filled recently. Admitted 12/30/2011 with weakness, slurring of speech with coughing/chocking and blood pressure 254/143. Patient placed on Cardene drip. Cranial CT and imaging revealed right thalamus and basal ganglia hemorrhage with intraventricular hemorrhage and cytotoxic cerebral edema with right to left midline shift. Evaluated by neurology services with tight blood pressure control with SBP<150. Started on 3% saline drip with goal of 155-160 Na to decrease cerebral edema. Noted dysphagia currently on a dysphagia 1 diet   Subjective/Complaints: Flat affect minimal verbal output No c/os Review of Systems  Unable to perform ROS: medical condition  Constitutional: Positive for malaise/fatigue.  Neurological: Positive for focal weakness.     Objective: Vital Signs: Blood pressure 124/83, pulse 85, temperature 98.5 F (36.9 C), temperature source Oral, resp. rate 20, height 5' 8.9" (1.75 m), weight 104.6 kg (230 lb 9.6 oz), SpO2 100.00%. No results found. Results for orders placed during the hospital encounter of 01/03/12 (from the past 72 hour(s))  BASIC METABOLIC PANEL     Status: Abnormal   Collection Time   01/08/12  2:44 PM      Component Value Range Comment   Sodium 146 (*) 135 - 145 mEq/L    Potassium 4.9  3.5 - 5.1 mEq/L    Chloride 108  96 - 112 mEq/L    CO2 22  19 - 32 mEq/L    Glucose, Bld 184 (*) 70 - 99 mg/dL    BUN 53 (*) 6 - 23 mg/dL    Creatinine, Ser 4.09 (*) 0.50 - 1.35 mg/dL    Calcium 9.9  8.4 - 81.1 mg/dL    GFR calc non Af Amer 18 (*) >90 mL/min    GFR calc Af Amer 21 (*) >90 mL/min   CBC     Status: Normal   Collection  Time   01/08/12  2:44 PM      Component Value Range Comment   WBC 6.0  4.0 - 10.5 K/uL    RBC 4.38  4.22 - 5.81 MIL/uL    Hemoglobin 13.2  13.0 - 17.0 g/dL    HCT 91.4  78.2 - 95.6 %    MCV 90.9  78.0 - 100.0 fL    MCH 30.1  26.0 - 34.0 pg    MCHC 33.2  30.0 - 36.0 g/dL    RDW 21.3  08.6 - 57.8 %    Platelets 150  150 - 400 K/uL   GLUCOSE, CAPILLARY     Status: Abnormal   Collection Time   01/08/12  3:09 PM      Component Value Range Comment   Glucose-Capillary 153 (*) 70 - 99 mg/dL    Comment 1 Documented in Chart     GLUCOSE, CAPILLARY     Status: Abnormal   Collection Time   01/09/12 12:46 AM      Component Value Range Comment   Glucose-Capillary 115 (*) 70 - 99 mg/dL   BASIC METABOLIC PANEL     Status: Abnormal   Collection Time   01/09/12  6:00 AM      Component Value Range Comment   Sodium 143  135 - 145 mEq/L    Potassium  4.6  3.5 - 5.1 mEq/L    Chloride 109  96 - 112 mEq/L    CO2 21  19 - 32 mEq/L    Glucose, Bld 117 (*) 70 - 99 mg/dL    BUN 64 (*) 6 - 23 mg/dL    Creatinine, Ser 1.61 (*) 0.50 - 1.35 mg/dL    Calcium 9.1  8.4 - 09.6 mg/dL    GFR calc non Af Amer 18 (*) >90 mL/min    GFR calc Af Amer 21 (*) >90 mL/min   GLUCOSE, CAPILLARY     Status: Normal   Collection Time   01/09/12 12:00 PM      Component Value Range Comment   Glucose-Capillary 94  70 - 99 mg/dL      HEENT: normal Cardio: RRR Resp: CTA B/L GI: BS positive Extremity:  No Edema Skin:   Intact Neuro: Flat, Cranial Nerve II-XII normal, Abnormal Sensory reduced lt touch on L, Abnormal Motor o/5 on L side, Abnormal FMC Tone  Hypotonia, Dysarthric and Aphasic Musc/Skel:  Swelling Left hand  Assessment/Plan: 1. Functional deficits secondary to R thalamic hemorrhage which require 3+ hours per day of interdisciplinary therapy in a comprehensive inpatient rehab setting. Physiatrist is providing close team supervision and 24 hour management of active medical problems listed below. Physiatrist and  rehab team continue to assess barriers to discharge/monitor patient progress toward functional and medical goals. FIM: FIM - Bathing Bathing Steps Patient Completed: Chest;Abdomen;Front perineal area;Right upper leg Bathing: 2: Max-Patient completes 3-4 87f 10 parts or 25-49%  FIM - Upper Body Dressing/Undressing Upper body dressing/undressing steps patient completed: Thread/unthread right sleeve of pullover shirt/dresss Upper body dressing/undressing: 1: Total-Patient completed less than 25% of tasks FIM - Lower Body Dressing/Undressing Lower body dressing/undressing: 1: Two helpers  FIM - Toileting Toileting: 1: Two helpers     FIM - Corporate investment banker: 1: Two helpers  FIM - Locomotion: Wheelchair Distance: 30; with total verbal, tactile and visual cues for use of RUE and RLE for propulsion in controlled environment secondary to impaired attention, initiation and sequencing Locomotion: Wheelchair: 1: Total Assistance/staff pushes wheelchair (Pt<25%) FIM - Locomotion: Ambulation Locomotion: Ambulation: 0: Activity did not occur  Comprehension Comprehension Mode: Auditory Comprehension: 1-Understands basic less than 25% of the time/requires cueing 75% of the time  Expression Expression Mode: Verbal Expression: 1-Expresses basis less than 25% of the time/requires cueing greater than 75% of the time.  Social Interaction Social Interaction: 1-Interacts appropriately less than 25% of the time. May be withdrawn or combative.  Problem Solving Problem Solving: 1-Solves basic less than 25% of the time - needs direction nearly all the time or does not effectively solve problems and may need a restraint for safety  Memory Memory: 1-Recognizes or recalls less than 25% of the time/requires cueing greater than 75% of the time   Medical Problem List and Plan:  1. DVT Prophylaxis/Anticoagulation: Pharmaceutical:  Lovenox  2. Pain Management: tylenol  3. Mood: begin low dose celexa, abilify already initiated, but change to HS dosing. Apparently he had been using this as an outpt, but it's unclear how long, why , or if he had been compliant with usage.  4. Neuropsych: This patient is not capable of making decisions on his/her own behalf Reduced intiation  Methylphenidate-d/c if recurrent seizure 5. Chronic renal failure  6. HTN: HCTZ, cardura, lisinopril  -monitor and adjust as indicated  7. Dysphagia: D1, pudding thick liquids -could not participate with  MBS due to cognition -ivf continuous monitor BMET-aggressive oral hygiene as he has poor oral-motor control 8.  Post stoke seizure d/o  LOS (Days) 8 A FACE TO FACE EVALUATION WAS PERFORMED  [name],[name] 01/11/2012, 7:50 AM

## 2012-01-11 NOTE — Progress Notes (Signed)
Social Work Patient ID: Collin Diaz, male   DOB: [birth year], 56 y.o.   MRN: [id number] Met with pt and wife to inform team conference goals-mod assist and discharge.  Informed both at this time team Recommends SNF upon discharge from rehab.  They feel pt is too much care for wife to be able to manage at home.  Wife  Reports she will have two nieces to help her, but is unsure if she can move him.  She states: " His left side doesn't move at all." Do not think wife is aware of the extent of pt's stroke, will ask MD to talk with wife.  Pt is more alert and hopefully participating in Therapies.  Will work on [employer] and [employer], just applied.  Wife to talk with nieces regarding how much help they can provide.

## 2012-01-11 NOTE — Progress Notes (Signed)
Chaplain visited patient as a response to family member's request for prayer. Patient was alert but seems to be exhausted. Chaplain prayed for patient and family member. Chaplain will inform the Chaplain assigned to this Unit to continue to provide spiritual care to both patient and family.

## 2012-01-11 NOTE — Progress Notes (Signed)
Physical Therapy Session Note  Patient Details  Name: Collin Diaz MRN: [id number] Date of Birth: November 17, 1955  Today's Date: 01/11/2012 Time: 1300-1400 Time Calculation (min): 60 min  Short Term Goals: Week 1:  PT Short Term Goal 1 (Week 1): Patient will initiate 25% of the time for bed mobility training with use of UE support and max A PT Short Term Goal 1 - Progress (Week 1): Not met PT Short Term Goal 2 (Week 1): Patient will transfer bed <> w/c with max A and initiate 25% of the time PT Short Term Goal 2 - Progress (Week 1): Not met PT Short Term Goal 3 (Week 1): Patient will perform w/c mobility on unit x 50' with R hemi technique and 75% cues for initiation and sequencing PT Short Term Goal 3 - Progress (Week 1): Not met PT Short Term Goal 4 (Week 1): Patient will tolerate dynamic sitting balance and postural control training activities without back support x 15 minutes with mod-max A PT Short Term Goal 4 - Progress (Week 1): Not met PT Short Term Goal 5 (Week 1): Patient will tolerate static standing in appropriate lift equipment for NMR to LLE, trunk control and pre-gait activities x 5 minutes PT Short Term Goal 5 - Progress (Week 1): Not met  Skilled Therapeutic Interventions/Progress Updates:  Tx focused on transfer training, sitting balance, and standing.   Pt propelled WC x30' in controlled environment with RUE/LE with Max A for steering and advancement with cues for LE propulsion and EU stroke for efficiency. Pt able to adjust R brakes with cues, but not L side.   Pt unable to assist scooting to edge of chair with verbal  cues or tactile cues for initiation. Attempted squat-pivot, but pt unable to assist, so transitioned to sliding board. Sliding board transfer WC<>mat with +2 total assist (pt 20%).   Static sitting balance x56min with Max support at trunk to maintain upright posture initially, progressing to Mod A. Pt unable to adjust posture with cues for anterior lean and  maintains posterior lean unless reaching for object. Dynamic balance with pt hitting ball with racquet and batting ball with RUE, all focusing in front of pt to encourage anterior lean. Pt not motivated by grabbing rings, needs salient tasks.   Stood in standing frame 2x2:30 with cognitive tasks, playing tic-tac-toe successfully, but needing mod/max a to keep trunk off table. Pt more alert following standing frame game than entire tx. Attempted sit<>stand at side of //bar, but pt unable to come to full upright position with +2 total A, so returned to sitting.       Therapy Documentation Precautions:  Precautions Precautions: Fall Precaution Comments: LUE and LE hemiparesis Restrictions Weight Bearing Restrictions: No    Pain: No complaints     Locomotion : Wheelchair Mobility Distance: 30  Other Treatments:    See FIM for current functional status  Therapy/Group: Individual Therapy  Virl Cagey, PT 01/11/2012, 2:26 PM

## 2012-01-12 ENCOUNTER — Inpatient Hospital Stay (HOSPITAL_COMMUNITY): Payer: Medicaid Other | Admitting: Occupational Therapy

## 2012-01-12 ENCOUNTER — Inpatient Hospital Stay (HOSPITAL_COMMUNITY): Payer: Medicaid Other | Admitting: Physical Therapy

## 2012-01-12 MED ORDER — HYDROCERIN EX CREA
TOPICAL_CREAM | Freq: Two times a day (BID) | CUTANEOUS | Status: DC
  Administered 2012-01-12 – 2012-01-26 (×30): via TOPICAL
  Administered 2012-01-27: 1 via TOPICAL
  Administered 2012-01-27 – 2012-01-28 (×2): via TOPICAL
  Administered 2012-01-28: 1 via TOPICAL
  Administered 2012-01-29 – 2012-02-04 (×13): via TOPICAL
  Administered 2012-02-04: 1 via TOPICAL
  Administered 2012-02-05: 20:00:00 via TOPICAL
  Administered 2012-02-05: 1 via TOPICAL
  Administered 2012-02-06 – 2012-02-11 (×11): via TOPICAL
  Administered 2012-02-11: 1 via TOPICAL
  Administered 2012-02-12: 11:00:00 via TOPICAL
  Filled 2012-01-12 (×3): qty 113

## 2012-01-12 NOTE — Progress Notes (Signed)
Patient ID: Collin Diaz, male   DOB: 14-Jun-1955, 56 y.o.   MRN: [id number] HPI: Collin Diaz is a 56 y.o. 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 Has history of poor controlled BP and had not had meds filled recently. Admitted 12/30/2011 with weakness, slurring of speech with coughing/chocking and blood pressure 254/143. Patient placed on Cardene drip. Cranial CT and imaging revealed right thalamus and basal ganglia hemorrhage with intraventricular hemorrhage and cytotoxic cerebral edema with right to left midline shift. Evaluated by neurology services with tight blood pressure control with SBP<150. Started on 3% saline drip with goal of 155-160 Na to decrease cerebral edema. Noted dysphagia currently on a dysphagia 1 diet   Subjective/Complaints: Flat affect minimal verbal output No c/os Po intake of calories dropping off megace started.  Discussed TF next week if po does not improve Food choices given D1 pudding thick diet seem to be an obstacle Review of Systems  Unable to perform ROS: medical condition  Constitutional: Positive for malaise/fatigue.  Neurological: Positive for focal weakness.     Objective: Vital Signs: Blood pressure 114/79, pulse 73, temperature 97.8 F (36.6 C), temperature source Oral, resp. rate 19, height 5' 8.9" (1.75 m), weight 104.6 kg (230 lb 9.6 oz), SpO2 96.00%. No results found. Results for orders placed during the hospital encounter of 01/03/12 (from the past 72 hour(s))  GLUCOSE, CAPILLARY     Status: Normal   Collection Time   01/09/12 12:00 PM      Component Value Range Comment   Glucose-Capillary 94  70 - 99 mg/dL      HEENT: normal Cardio: RRR Resp: CTA B/L GI: BS positive Extremity:  No Edema Skin:   Intact Neuro: Flat, Cranial Nerve II-XII normal, Abnormal Sensory reduced lt touch on L, Abnormal Motor  o/5 on L side, Abnormal FMC Tone  Hypotonia, Dysarthric and Aphasic Musc/Skel:  Swelling Left hand  Assessment/Plan: 1. Functional deficits secondary to R thalamic hemorrhage which require 3+ hours per day of interdisciplinary therapy in a comprehensive inpatient rehab setting. Physiatrist is providing close team supervision and 24 hour management of active medical problems listed below. Physiatrist and rehab team continue to assess barriers to discharge/monitor patient progress toward functional and medical goals. FIM: FIM - Bathing Bathing Steps Patient Completed: Chest;Abdomen;Right upper leg;Left upper leg Bathing: 2: Max-Patient completes 3-4 44f 10 parts or 25-49%  FIM - Upper Body Dressing/Undressing Upper body dressing/undressing steps patient completed: Thread/unthread right sleeve of pullover shirt/dresss Upper body dressing/undressing: 2: Max-Patient completed 25-49% of tasks FIM - Lower Body Dressing/Undressing Lower body dressing/undressing: 1: Two helpers  FIM - Toileting Toileting: 1: Two helpers  FIM - Archivist Transfers: 0-Activity did not occur  FIM - Banker Devices: Sliding board;Arm rests Bed/Chair Transfer: 1: Two helpers  FIM - Locomotion: Wheelchair Distance: 30 Locomotion: Wheelchair: 1: Travels less than 50 ft with maximal assistance (Pt: 25 - 49%) FIM - Locomotion: Ambulation Locomotion: Ambulation: 0: Activity did not occur  Comprehension Comprehension Mode: Auditory Comprehension: 3-Understands basic 50 - 74% of the time/requires cueing 25 - 50%  of the time  Expression Expression Mode: Verbal Expression: 1-Expresses basis less than 25% of the time/requires cueing greater than 75% of the time.  Social Interaction Social Interaction: 1-Interacts appropriately less than 25% of the time. May be withdrawn or combative.  Problem Solving Problem Solving: 1-Solves basic less  than 25% of the time  - needs direction nearly all the time or does not effectively solve problems and may need a restraint for safety  Memory Memory: 2-Recognizes or recalls 25 - 49% of the time/requires cueing 51 - 75% of the time   Medical Problem List and Plan:  1. DVT Prophylaxis/Anticoagulation: Pharmaceutical: Lovenox  2. Pain Management: tylenol  3. Mood: begin low dose celexa, abilify already initiated, but change to HS dosing. Apparently he had been using this as an outpt, but it's unclear how long, why , or if he had been compliant with usage.  4. Neuropsych: This patient is not capable of making decisions on his/her own behalf Reduced intiation  Methylphenidate-d/c if recurrent seizure 5. Chronic renal failure  6. HTN: HCTZ, cardura,D/C lisinopril due to elevated BUN -monitor and adjust as indicated  7. Dysphagia: D1, pudding thick liquids -could not participate with MBS due to cognition -ivf continuous monitor BMET-aggressive oral hygiene as he has poor oral-motor control ? PEG 8.  Post stoke seizure d/o  LOS (Days) 9 A FACE TO FACE EVALUATION WAS PERFORMED  [name],[name] 01/12/2012, 7:29 AM

## 2012-01-12 NOTE — Progress Notes (Signed)
Speech Language Pathology Daily Session Note & Weekly Progress Note  Patient Details  Name: Collin Diaz MRN: [id number] Date of Birth: [birth year]  Today's Date: 01/12/2012 Time: 4098-1191 Time Calculation (min): 15 min  Short Term Goals: Week 1: SLP Short Term Goal 1 (Week 1): Pt will utilize respiratory support sufficient to sustain phonation for 6-10 seconds with mod cues. SLP Short Term Goal 1 - Progress (Week 1): Progressing toward goal SLP Short Term Goal 2 (Week 1): Pt will complete effortful swallow in combination with towel tuck in order to improve hyolaryngeal elevation with mod cues. SLP Short Term Goal 2 - Progress (Week 1): Progressing toward goal SLP Short Term Goal 3 (Week 1): Pt will tolerate dysphagia 1 diet with pudding thick liquids with mod assist for precautions.  SLP Short Term Goal 3 - Progress (Week 1): Progressing toward goal SLP Short Term Goal 4 (Week 1): Pt will produce 1-2 word utterances with max assist to increase intelligibility.   SLP Short Term Goal 4 - Progress (Week 1): Progressing toward goal  Skilled Therapeutic Interventions: Patient seen for co-treatment with OT to address level of arousal with motor activity during a self-feeding task. SLP facilitated session with by presenting pudding-thick liquids via teaspoon and providing mod-min assist verbal cues to initiate swallow in a timely manner, 5-8 seconds at best.  SLP completed skilled observation: patient exhibited increased initiation, decreased mastication, a timelier swallow and significant decrease in anterior loss of bolus.  Patient exhibited no overt s/s of aspiration during session. Recommend continuation of current diet and re-attempt objective study next week pending continuation of progress.     FIM:  Comprehension Comprehension Mode: Auditory Comprehension: 3-Understands basic 50 - 74% of the time/requires cueing 25 - 50%  of the time Expression Expression Mode: Verbal Expression:  1-Expresses basis less than 25% of the time/requires cueing greater than 75% of the time. Social Interaction Social Interaction: 2-Interacts appropriately 25 - 49% of time - Needs frequent redirection. Problem Solving Problem Solving: 2-Solves basic 25 - 49% of the time - needs direction more than half the time to initiate, plan or complete simple activities Memory Memory: 2-Recognizes or recalls 25 - 49% of the time/requires cueing 51 - 75% of the time FIM - Eating Eating Activity: 4: Helper checks for pocketed food  Pain Pain Assessment Pain Assessment: No/denies pain  Therapy/Group: Individual Therapy   Speech Language Pathology Weekly Progress Note  Patient Details  Name: Collin Diaz MRN: [id number] Date of Birth: Aug 01, 1955  Today's Date: 01/12/2012  Short Term Goals: Week 1: SLP Short Term Goal 1 (Week 1): Pt will utilize respiratory support sufficient to sustain phonation for 6-10 seconds with mod cues. SLP Short Term Goal 1 - Progress (Week 1): Progressing toward goal SLP Short Term Goal 2 (Week 1): Pt will complete effortful swallow in combination with towel tuck in order to improve hyolaryngeal elevation with mod cues. SLP Short Term Goal 2 - Progress (Week 1): Progressing toward goal SLP Short Term Goal 3 (Week 1): Pt will tolerate dysphagia 1 diet with pudding thick liquids with mod assist for precautions.  SLP Short Term Goal 3 - Progress (Week 1): Progressing toward goal SLP Short Term Goal 4 (Week 1): Pt will produce 1-2 word utterances with max assist to increase intelligibility.   SLP Short Term Goal 4 - Progress (Week 1): Progressing toward goal Week 2: SLP Short Term Goal 1 (Week 2): Pt will utilize respiratory support sufficient to sustain phonation for 5  seconds with mod cues.  SLP Short Term Goal 2 (Week 2): Pt will complete effortful swallow in combination with towel tuck in order to improve hyolaryngeal elevation with mod cues. SLP Short Term Goal 3  (Week 2): Pt will tolerate dysphagia 1 diet with pudding thick liquids with mod assist for precautions.  SLP Short Term Goal 4 (Week 2): Pt will produce 1 word utterances with max assist to increase intelligibility.  SLP Short Term Goal 5 (Week 2): Patient will demonstrate ability to participate in an objective swallow study with mod assist.   Weekly Progress Updates: Patient did not meet any of his short term objectives this reporting period due to significant functional change in ability which impacted his ability to maintain arousal and participate.  As a result, therapy has focused on co-treatments with OT to facilitate increased motor involvement and stimulation of patient's reticular activating system.  Therapies have noted functional improvements in the past few days and SLP anticipates that if this progress continues he will be able to participate in an objective swallow study next week.  SLP also modified short term goals to increase attainability in this coming week.  While patient has not met goals he continues to benefit from skilled SLP services to maximize functional independence and reduce burden of care at [facility].        SLP Frequency: 1-2 X/day, 30-60 minutes;5 out of 7 days;Total of 15 hours over 7 days Frequency due to: fatigue and decreased arousal; working toward increasing paticipation and endurance Estimated Length of Stay: TBD  SLP Treatment/Interventions: Cognitive remediation/compensation;Cueing hierarchy;Dysphagia/aspiration precaution training;Environmental controls;Functional tasks;Internal/external aids;Multimodal communication approach;Patient/family education;Speech/Language facilitation;Therapeutic Activities;Therapeutic Exercise;Other (comment) (group treatment)  Fae Pippin, M.A., CCC-SLP [phone]  Lariza Cothron 01/12/2012, 4:27 PM

## 2012-01-12 NOTE — Progress Notes (Signed)
Occupational Therapy Weekly Progress Note and Treatment Session  Patient Details  Name: Collin Diaz MRN: [id number] Date of Birth: Jun 08, 1955  Today's Date: 01/12/2012 Time: 4098-1191 Time Calculation (min): 60 min  Patient has met 0 of 5 short term goals.  Pt with limited progress this week, as over the weekend pt missed two-three days of therapy secondary to seizure activity and decreased ability to participate secondary to arousal.  Patient is making very slow progress towards goals, but is demonstrating increased arousal, attention to task, initiation, sequencing, and participation in transfers and sit <> stand.  Therapy team is recommending SNF at this time, however wife insists on taking pt home.    Patient continues to demonstrate the following deficits: LUE hemiplegia, apraxia, impaired sustained attention, decreased arousal, impaired postural control, and decreased ability to maintain static sitting or standing and therefore will continue to benefit from skilled OT intervention to enhance overall performance with BADL and Reduce care partner burden.  Patient progressing toward long term goals..  Continue plan of care.  OT Short Term Goals Week 1:  OT Short Term Goal 1 (Week 1): Pt will maintain static sitting for 5 mins with close supervision in preparation for selfcare tasks. OT Short Term Goal 1 - Progress (Week 1): Progressing toward goal OT Short Term Goal 2 (Week 1): Pt will perform LB bathing sit to stand with Total +2 (pt 40%). OT Short Term Goal 2 - Progress (Week 1): Progressing toward goal OT Short Term Goal 3 (Week 1): Pt will perfrom UB dressing in supportive sitting with min facilitation. OT Short Term Goal 3 - Progress (Week 1): Progressing toward goal OT Short Term Goal 4 (Week 1): Pt will perform supine to sit with max assist in preparation for selfcare tasks. OT Short Term Goal 4 - Progress (Week 1): Progressing toward goal OT Short Term Goal 5 (Week 1): Pt will  perform toilet transfers squat pivot with max assist. OT Short Term Goal 5 - Progress (Week 1): Progressing toward goal Week 2:  OT Short Term Goal 1 (Week 2): Pt will maintain static sitting for 5 mins with close supervision in preparation for selfcare tasks OT Short Term Goal 2 (Week 2): Pt will perform LB bathing sit to stand with Total +2 (pt 40%). OT Short Term Goal 3 (Week 2): Pt will perfrom UB dressing in supportive sitting with min facilitation.  OT Short Term Goal 4 (Week 2): Pt will perform supine to sit with max assist in preparation for selfcare tasks OT Short Term Goal 5 (Week 2): Pt will perform toilet transfers squat pivot with max assist.  Skilled Therapeutic Interventions/Progress Updates:    Pt seen for ADL retraining with bathing and dressing seated at EOB.  Focus on increased attention to task, increased participation in self-care tasks, and sit <> stand.  Pt required moderate assistance to sit at EOB throughout session this AM, requiring mod verbal cues for weight shifting to Rt and visual cue to weight shift towards therapist on Rt.  Verbal and tactile cues provided to encourage pt to sit upright with head lifted to improve posture as well as allow for improved movements.  Pt demonstrated ability to initiate parts of bathing tasks when prompted, however unable to fully complete each task.  Pt with improved carryover from previous OT session.  Pt assist to standing from elevated bed height with +2 (three Musketeer style) to complete LB dressing.  Pt able to maintain modified standing position for approx 1-1.5 mins.  Multiple scoot/squat pivot to w/c without use of slideboard, +2 total assist.  Therapy Documentation Precautions:  Precautions Precautions: Fall Precaution Comments: Lt sided hemiparesis Restrictions Weight Bearing Restrictions: No Pain: Pain Assessment Pain Assessment: No/denies pain ADL: ADL Grooming: Moderate assistance Where Assessed-Grooming:  Wheelchair Upper Body Bathing: Moderate assistance Where Assessed-Upper Body Bathing: Edge of bed Lower Body Bathing: Maximal assistance Where Assessed-Lower Body Bathing: Edge of bed Upper Body Dressing: Maximal assistance Where Assessed-Upper Body Dressing: Edge of bed Lower Body Dressing: Dependent Where Assessed-Lower Body Dressing: Edge of bed Toileting: Dependent Where Assessed-Toileting: Bedside Commode Toilet Transfer: Dependent Toilet Transfer Method: Charity fundraiser: Bedside commode ADL Comments: Pt with increased alertness and attention to task past couple days, able to initiate tasks with more consistency however has difficulty completing tasks secondary to ?attention/fatigue.  See FIM for current functional status  Therapy/Group: Individual Therapy  Leonette Monarch 01/12/2012, 11:26 AM

## 2012-01-12 NOTE — Progress Notes (Signed)
Physical Therapy Session Note  Patient Details  Name: Collin Diaz MRN: [id number] Date of Birth: 04/16/1956  Today's Date: 01/12/2012 Time: 1300-1400 Time Calculation (min): 60 min  Short Term Goals: Week 2:  PT Short Term Goal 1 (Week 2): Patient will initiate 25% of the time for bed mobility training with use of UE support and max A PT Short Term Goal 2 (Week 2): Patient will transfer bed <> w/c with max A and initiate 25% of the time  PT Short Term Goal 3 (Week 2): Patient will perform w/c mobility on unit x 50' with R hemi technique and 75% cues for initiation and sequencing  PT Short Term Goal 4 (Week 2): Patient will tolerate dynamic sitting balance and postural control training activities without back support x 15 minutes with mod-max A PT Short Term Goal 5 (Week 2): Patient will tolerate static standing in appropriate lift equipment for NMR to LLE, trunk control and pre-gait activities x 5 minutes   Skilled Therapeutic Interventions/Progress Updates:   Patient in bed; patient reporting he has had an incontinent BM.  Patient performed multiple rolls in bed L and R with use of bed rail and +2 A for LLE and UE management and for full roll to side and cues to maintain during clothing, brief change and hygiene.  Performed supine > sit with rail and +2 A to sit EOB; patient continues to present with L and posterior lean;cued patient to lean forward to bring shoes to floor to don; patient unable to lean fully forward to bring shoe to floor without dropping shoe.  Donned shoes and performed slideboard transfer bed > w/c to R with max-total A for initiation, sequencing and for lifting assistance to advance buttocks across board.  Performed w/c mobility training in hall way x 25' in controlled environment with R hemi technique and max-total A for initiation and sequencing; patient still attempting to perform mostly with UE propulsion and required constant verbal and tactile cues to use RLE for  propulsion/steering.   Therapy Documentation Precautions:  Precautions Precautions: Fall Precaution Comments: Lt sided hemiparesis Restrictions Weight Bearing Restrictions: No Pain: Pain Assessment Pain Assessment: No/denies pain Locomotion : Wheelchair Mobility Distance: 25   See FIM for current functional status  Therapy/Group: Individual Therapy  [name] [facility] 01/12/2012, 3:36 PM

## 2012-01-12 NOTE — Progress Notes (Signed)
Occupational Therapy Session Note  Patient Details  Name: Collin Diaz MRN: [id number] Date of Birth: 1955-10-17  Today's Date: 01/12/2012 Time: 1400-1430 Time Calculation (min): 30 min  Short Term Goals: Week 1:  OT Short Term Goal 1 (Week 1): Pt will maintain static sitting for 5 mins with close supervision in preparation for selfcare tasks. OT Short Term Goal 1 - Progress (Week 1): Progressing toward goal OT Short Term Goal 2 (Week 1): Pt will perform LB bathing sit to stand with Total +2 (pt 40%). OT Short Term Goal 2 - Progress (Week 1): Progressing toward goal OT Short Term Goal 3 (Week 1): Pt will perfrom UB dressing in supportive sitting with min facilitation. OT Short Term Goal 3 - Progress (Week 1): Progressing toward goal OT Short Term Goal 4 (Week 1): Pt will perform supine to sit with max assist in preparation for selfcare tasks. OT Short Term Goal 4 - Progress (Week 1): Progressing toward goal OT Short Term Goal 5 (Week 1): Pt will perform toilet transfers squat pivot with max assist. OT Short Term Goal 5 - Progress (Week 1): Progressing toward goal  Skilled Therapeutic Interventions/Progress Updates:    Patient seen in co-treatment with speech/language pathologist to address postural control and static sitting balance while trials of pudding thickened milk taken by teaspoon.  Patient with severely impaired arousal, yet today had eyes open much of the session.  Patient with severely delayed response, but with improved demand for balance, for speed, etc, patient able to actively participate for seconds at a time (5+ sec at a time.)  Patient with best response with a very intentional target.  Transfer via scoot pivot and total assistance, although patient able to initiate the proper weight shift forward, and begin to unweight seat for level surface scoot.    Therapy Documentation Precautions:  Precautions Precautions: Fall Precaution Comments: Lt sided  hemiparesis Restrictions Weight Bearing Restrictions: No  Pain: Pain Assessment Pain Assessment: No/denies pain   See FIM for current functional status  Therapy/Group: Individual Therapy  [name] 01/12/2012, 4:45 PM

## 2012-01-12 NOTE — Progress Notes (Signed)
Nutrition Follow-up/Calorie Count Initiation  Intervention:   1. RD to follow-up with calorie count results on Monday and provide recommendations at that time  Assessment:   Pt remains on Dysphagia 1 diet. RD consulted for calorie count. ?need for PEG soon. Pt eating mostly 10 - 25% of meals. Discussed calorie count with RN.  Skin: skin remains intact Labs: reviewed; no new labs Pertinent meds: continues on megace and IVF Last BM: 9/26  Diet Order:  Dysphagia 1 diet, Pudding Thickened Liquids Supplements: Ensure Pudding PO TID  Meds: Scheduled Meds:   . antiseptic oral rinse  15 mL Mouth Rinse q12n4p  . ARIPiprazole  1 mg Oral QHS  . chlorhexidine  15 mL Mouth Rinse BID  . citalopram  10 mg Oral Daily  . doxazosin  2 mg Oral QHS  . enoxaparin  40 mg Subcutaneous Daily  . feeding supplement  1 Container Oral TID BM  . hydrocerin   Topical BID  . levETIRAcetam  500 mg Oral BID  . megestrol  400 mg Oral BID  . methylphenidate  10 mg Oral BID WC  . DISCONTD: lisinopril  10 mg Oral BID   Continuous Infusions:   . sodium chloride 75 mL/hr (01/12/12 0658)   PRN Meds:.acetaminophen, alum & mag hydroxide-simeth, bisacodyl, diphenhydrAMINE, food thickener, guaiFENesin-dextromethorphan, ondansetron (ZOFRAN) IV, prochlorperazine, prochlorperazine, prochlorperazine, senna-docusate, traZODone  Labs:  CMP     Component Value Date/Time   NA 143 01/09/2012 0600   K 4.6 01/09/2012 0600   CL 109 01/09/2012 0600   CO2 21 01/09/2012 0600   GLUCOSE 117* 01/09/2012 0600   BUN 64* 01/09/2012 0600   CREATININE 3.48* 01/09/2012 0600   CALCIUM 9.1 01/09/2012 0600   PROT 6.9 01/04/2012 0606   ALBUMIN 3.2* 01/04/2012 0606   AST 23 01/04/2012 0606   ALT 29 01/04/2012 0606   ALKPHOS 90 01/04/2012 0606   BILITOT 0.5 01/04/2012 0606   GFRNONAA 18* 01/09/2012 0600   GFRAA 21* 01/09/2012 0600   Sodium  Date/Time Value Range Status  01/09/2012  6:00 AM 143  135 - 145 mEq/L Final  01/08/2012  2:44 PM 146* 135 -  145 mEq/L Final  01/04/2012  6:06 AM 152* 135 - 145 mEq/L Final    Potassium  Date/Time Value Range Status  01/09/2012  6:00 AM 4.6  3.5 - 5.1 mEq/L Final  01/08/2012  2:44 PM 4.9  3.5 - 5.1 mEq/L Final  01/04/2012  6:06 AM 4.3  3.5 - 5.1 mEq/L Final    Phosphorus  Date/Time Value Range Status  12/31/2011  5:50 AM 2.8  2.3 - 4.6 mg/dL Final    Magnesium  Date/Time Value Range Status  12/31/2011  5:50 AM 1.9  1.5 - 2.5 mg/dL Final   CBG (last 3)  No results found for this basename: GLUCAP:3 in the last 72 hours   Intake/Output Summary (Last 24 hours) at 01/12/12 1230 Last data filed at 01/12/12 0938  Gross per 24 hour  Intake    320 ml  Output   1050 ml  Net   -730 ml   Weight Status:  230 lb - wt 9/24, no new wt  Body mass index is 34.15 kg/(m^2). Obese Class I  Etimated needs:  1875 -2075 kcal, 80 -90 grams protein  Nutrition Dx:  Inadequate oral intake r/t poor attention AEB poor meal completion.  Goal:  Pt to meet >/= 90% of their estimated nutrition needs  Monitor:  weight trends, lab trends, I/O's, PO intake, supplement  tolerance  Jarold Motto MS, RD, LDN Pager: [phone] After-hours pager: [phone]

## 2012-01-12 NOTE — Procedures (Signed)
EEG NUMBER:  [id number].  REFERRING PHYSICIAN:  Dr. Delle Reining.  INDICATION FOR STUDY:  56 year old man with a history of recent left MCA and thalamic stroke with hemorrhage who expressed an episode of unconsciousness with left upper extremity twitching and drooling.  Study is being performed to rule out signs of seizure disorder.  DESCRIPTION:  This is a routine EEG recording performed during wakefulness.  The predominant background activity consists of low amplitude 1-2 Hz diffuse continuous delta activity with superimposed 6-7 Hz irregular theta activity, which was also recorded diffusely.  Photic stimulation produced minimal symmetrical occipital driving response. Hyperventilation was not performed.  No epileptiform discharges were recorded.  INTERPRETATION:  EEG showed mild to moderate generalized nonspecific slowing of cerebral activity continuously.  This pattern of slowing can be seen with degenerative as well as metabolic encephalopathy.  Slowing can also be seen with sedating medications.  No evidence of an epileptic disorder was seen.     Noel Christmas, MD    ZO:[id number] D:  01/09/2012 16:13:01  T:  01/10/2012 06:03:08  Job #:  [id number]

## 2012-01-13 ENCOUNTER — Inpatient Hospital Stay (HOSPITAL_COMMUNITY): Payer: Medicaid Other | Admitting: Occupational Therapy

## 2012-01-13 ENCOUNTER — Inpatient Hospital Stay (HOSPITAL_COMMUNITY): Payer: Medicaid Other | Admitting: Physical Therapy

## 2012-01-13 ENCOUNTER — Inpatient Hospital Stay (HOSPITAL_COMMUNITY): Payer: Medicaid Other | Admitting: Speech Pathology

## 2012-01-13 LAB — URINE MICROSCOPIC-ADD ON

## 2012-01-13 LAB — URINALYSIS, ROUTINE W REFLEX MICROSCOPIC
Bilirubin Urine: NEGATIVE
Specific Gravity, Urine: 1.014 (ref 1.005–1.030)
pH: 5 (ref 5.0–8.0)

## 2012-01-13 NOTE — Progress Notes (Signed)
Patient ID: Collin Diaz, male   DOB: 12/30/1955, 56 y.o.   MRN: [id number] HPI: Collin Diaz is a 56 y.o. 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 Has history of poor controlled BP and had not had meds filled recently. Admitted 12/30/2011 with weakness, slurring of speech with coughing/chocking and blood pressure 254/143. Patient placed on Cardene drip. Cranial CT and imaging revealed right thalamus and basal ganglia hemorrhage with intraventricular hemorrhage and cytotoxic cerebral edema with right to left midline shift. Evaluated by neurology services with tight blood pressure control with SBP<150. Started on 3% saline drip with goal of 155-160 Na to decrease cerebral edema. Noted dysphagia currently on a dysphagia 1 diet   Subjective/Complaints: Shakes head to yes, no questions Review of Systems  Unable to perform ROS: medical condition     Objective: Vital Signs: Blood pressure 119/81, pulse 78, temperature 97.5 F (36.4 C), temperature source Oral, resp. rate 18, height 5' 8.9" (1.75 m), weight 230 lb 9.6 oz (104.6 kg), SpO2 97.00%. No results found. Results for orders placed during the hospital encounter of 01/03/12 (from the past 72 hour(s))  URINALYSIS, ROUTINE W REFLEX MICROSCOPIC     Status: Abnormal   Collection Time   01/13/12  5:50 AM      Component Value Range Comment   Color, Urine YELLOW  YELLOW    APPearance CLEAR  CLEAR    Specific Gravity, Urine 1.014  1.005 - 1.030    pH 5.0  5.0 - 8.0    Glucose, UA NEGATIVE  NEGATIVE mg/dL    Hgb urine dipstick MODERATE (*) NEGATIVE    Bilirubin Urine NEGATIVE  NEGATIVE    Ketones, ur NEGATIVE  NEGATIVE mg/dL    Protein, ur 30 (*) NEGATIVE mg/dL    Urobilinogen, UA 0.2  0.0 - 1.0 mg/dL    Nitrite NEGATIVE  NEGATIVE    Leukocytes, UA NEGATIVE  NEGATIVE   URINE MICROSCOPIC-ADD ON     Status:  Normal   Collection Time   01/13/12  5:50 AM      Component Value Range Comment   WBC, UA 0-2  <3 WBC/hpf    RBC / HPF 3-6  <3 RBC/hpf    Bacteria, UA RARE  RARE      Elderly appearing male in no acute distress. HEENT exam atraumatic, normocephalic, neck supple without jugular venous distention. Chest clear to auscultation cardiac exam S1-S2 are regular. Abdominal exam overweight with bowel sounds, soft and nontender. Extremities no edema.    Assessment/Plan: 1. Functional deficits secondary to R thalamic hemorrhage which require 3+ hours per day of interdisciplinary therapy in a comprehensive inpatient rehab setting. Physiatrist is providing close team supervision and 24 hour management of active medical problems listed below.   Medical Problem List and Plan:  1. DVT Prophylaxis/Anticoagulation: Pharmaceutical: Lovenox  2. Pain Management: tylenol  3. Mood: begin low dose celexa, abilify already initiated, but change to HS dosing. Apparently he had been using this as an outpt, but it's unclear how long, why , or if he had been compliant with usage.  4. Neuropsych: This patient is not capable of making decisions on his/her own behalf Reduced intiation  Methylphenidate-d/c if recurrent seizure 5. Chronic renal failure Basic Metabolic Panel:    Component Value Date/Time   NA 143 01/09/2012 0600   K 4.6 01/09/2012 0600   CL 109 01/09/2012 0600   CO2  21 01/09/2012 0600   BUN 64* 01/09/2012 0600   CREATININE 3.48* 01/09/2012 0600   GLUCOSE 117* 01/09/2012 0600   CALCIUM 9.1 01/09/2012 0600    6. HTN: HCTZ, cardura,D/C lisinopril due to elevated BUN -monitor and adjust as indicated  BP Readings from Last 3 Encounters:  01/13/12 119/81  01/03/12 123/74  10/06/11 123/61  7. Dysphagia: D1, pudding thick liquids -could not participate with MBS due to cognition -ivf continuous monitor BMET-aggressive oral hygiene as he has poor oral-motor control ? PEG 8.  Post stoke seizure d/o  LOS  (Days) 10 A FACE TO FACE EVALUATION WAS PERFORMED  Collin Diaz 01/13/2012, 10:56 AM

## 2012-01-13 NOTE — Progress Notes (Signed)
BLADDER SCANNED 175 ML AT THIS TIME. NO I&O DONE.

## 2012-01-13 NOTE — Progress Notes (Signed)
VOIDED IN DIAPER, SML AMT OLD DRIED BLD NOTED ON PENIS.

## 2012-01-13 NOTE — Progress Notes (Signed)
Physical Therapy Session Note  Patient Details  Name: Collin Diaz MRN: [id number] Date of Birth: 11-25-1955  Today's Date: 01/13/2012 Time: 1430-1500 Time Calculation (min): 30 min  Short Term Goals: Week 1:  PT Short Term Goal 1 (Week 1): Patient will initiate 25% of the time for bed mobility training with use of UE support and max A PT Short Term Goal 1 - Progress (Week 1): Not met PT Short Term Goal 2 (Week 1): Patient will transfer bed <> w/c with max A and initiate 25% of the time PT Short Term Goal 2 - Progress (Week 1): Not met PT Short Term Goal 3 (Week 1): Patient will perform w/c mobility on unit x 50' with R hemi technique and 75% cues for initiation and sequencing PT Short Term Goal 3 - Progress (Week 1): Not met PT Short Term Goal 4 (Week 1): Patient will tolerate dynamic sitting balance and postural control training activities without back support x 15 minutes with mod-max A PT Short Term Goal 4 - Progress (Week 1): Not met PT Short Term Goal 5 (Week 1): Patient will tolerate static standing in appropriate lift equipment for NMR to LLE, trunk control and pre-gait activities x 5 minutes PT Short Term Goal 5 - Progress (Week 1): Not met  Therapy Documentation Precautions:  Precautions Precautions: Fall Precaution Comments: Lt sided hemiparesis Restrictions Weight Bearing Restrictions: No Pain:  Denies pain  Therapeutic Activity; (30') Bed mobility Total Assist Rolling R and L and with scooting to [facility], Transfer Training supine<->sit Total Assist via L side lying, static sitting balance with Max/TA to maintain midline position  Patient responding to verbal cues to lean forward but unable to maintain midline sitting balance, tends to lean back and to the left.    Therapy/Group: Individual Therapy  Rex Kras 01/13/2012, 2:31 PM

## 2012-01-13 NOTE — Progress Notes (Signed)
Speech Language Pathology Daily Session Note  Patient Details  Name: Collin Diaz MRN: [id number] Date of Birth: 02/10/1956  Today's Date: 01/13/2012 Time: 1130-1200 Time Calculation (min): 30 min    Skilled Therapeutic Interventions:  Pt. Seen for dysphagia therapy in [employer] group.  He required moderate cues to remove left labial residue and anterior oral cavity residue x 1.  Wet vocal qualtiy x 2, no coughing.  Pt. Did not want to try puree meats or vegetables and exhibited facial grimaces when looking at it.  Is diet texture able to be upgraded to Dys 2?      Therapy/Group: Dysphagia Group  Royce Macadamia 01/13/2012, 5:05 PM

## 2012-01-13 NOTE — Progress Notes (Signed)
Occupational Therapy Session Note  Patient Details  Name: Collin Diaz MRN: [id number] Date of Birth: [birth year]  Today's Date: 01/13/2012 Time: 1200-1215 Time Calculation (min): 15 min  Skilled Therapeutic Interventions/Progress Updates:    Pt seen for group session, [employer], with SLP with focus on initiation with self-feeding.  Pt able to use RUE to self-feed with occasional cues for initiation and attention to task.  Pt with minimal PO intake, with no desire to attempt Dys. 1 chicken.  Pt consumed apple sauce and attempted pudding thick lemonade.  Pt with no c/o pain this session.  Leonette Monarch 01/13/2012, 1:04 PM

## 2012-01-13 NOTE — Progress Notes (Signed)
At 2000, pt was incontinent of urine, bladder scan was 58 per the tech, will pass it on to the on-coming rn for bladder scan to be done q8 and I&OUT cath done as needed and per MD's order---Janny Crute, rn

## 2012-01-13 NOTE — Progress Notes (Signed)
Occupational Therapy Session Note  Patient Details  Name: JAIZON DEROOS MRN: [id number] Date of Birth: [birth year]  Today's Date: 01/13/2012 Time: 1005-1105 Time Calculation (min): 60 min  Short Term Goals: Week 2:  OT Short Term Goal 1 (Week 2): Pt will maintain static sitting for 5 mins with close supervision in preparation for selfcare tasks OT Short Term Goal 2 (Week 2): Pt will perform LB bathing sit to stand with Total +2 (pt 40%). OT Short Term Goal 3 (Week 2): Pt will perfrom UB dressing in supportive sitting with min facilitation.  OT Short Term Goal 4 (Week 2): Pt will perform supine to sit with max assist in preparation for selfcare tasks OT Short Term Goal 5 (Week 2): Pt will perform toilet transfers squat pivot with max assist.  Skilled Therapeutic Interventions/Progress Updates:    Pt seen for ADL retraining with focus on bed mobility, dynamic and static standing balance, increased participation in self-care tasks of bathing and dressing.  Pt seated at EOB with min assist to maintain upright, midline seated posture with UB bathing. Increased time for initiation with pt able to initiate UB bathing after initial cue.  Focus on sequencing and weight shifting to complete LB bathing.  Pt hesitant to lean forward, however able to wash legs (except feet) with tactile cues and support at trunk to maintain balance.  Pt demonstrated increased weight shifting to obtain towel on Lt side of body with Rt hand, requiring trunk rotation.  Pt more alert this session and able to follow directions with increased time.  Therapy Documentation Precautions:  Precautions Precautions: Fall Precaution Comments: Lt sided hemiparesis Restrictions Weight Bearing Restrictions: No Pain:  Pt with no c/o pain this session.  See FIM for current functional status  Therapy/Group: Individual Therapy  Leonette Monarch 01/13/2012, 12:58 PM

## 2012-01-14 ENCOUNTER — Inpatient Hospital Stay (HOSPITAL_COMMUNITY): Payer: Medicaid Other

## 2012-01-14 ENCOUNTER — Inpatient Hospital Stay (HOSPITAL_COMMUNITY): Payer: Medicaid Other | Admitting: Physical Therapy

## 2012-01-14 NOTE — Progress Notes (Signed)
Physical Therapy Note  Patient Details  Name: Collin Diaz MRN: [id number] Date of Birth: 04-Jul-1955 Today's Date: 01/14/2012  1300-1355 (55 minutes) individual Pain: no reported pain Focus to treatment: bed mobility training, transfer training, Neuro re-ed LT LE, wc mobility training, therapeutic activities focused on trunk control in static sitting Treatment: rolling side to side in bed to donn slacks using bedrail to left  tactile cues RT UE with patient flexing RT hip to facilitate roll max assist , to left max assist trunk, LT extremities with no attempt to assist; side to sit total assist ; sitting edge of bed max assist with loss of balance to left or posterior; wc mobility max assist with tactile cues at both RT UE/LE; transfer (level) total assist +2 (pt 10%) using sliding board bed > wc or wc >< mat; rolling side to side on mat using therapy ball under LEs mod assist with max cues to attend to task; sitting edge of mat performing reaching activities or balloon tossing max assist to maintain balance with no attempt to correct loss of balance . Pt followed balloon toss without stopping.   Collin Diaz,JIM 01/14/2012, 7:57 AM

## 2012-01-14 NOTE — Progress Notes (Signed)
Occupational Therapy Session Note  Patient Details  Name: Collin Diaz MRN: [id number] Date of Birth: [birth year]  Today's Date: 01/14/2012 Time: 2130-8657 Time Calculation (min): 38 min  Short Term Goals: Week 1:  OT Short Term Goal 1 (Week 1): Pt will maintain static sitting for 5 mins with close supervision in preparation for selfcare tasks. OT Short Term Goal 1 - Progress (Week 1): Progressing toward goal OT Short Term Goal 2 (Week 1): Pt will perform LB bathing sit to stand with Total +2 (pt 40%). OT Short Term Goal 2 - Progress (Week 1): Progressing toward goal OT Short Term Goal 3 (Week 1): Pt will perfrom UB dressing in supportive sitting with min facilitation. OT Short Term Goal 3 - Progress (Week 1): Progressing toward goal OT Short Term Goal 4 (Week 1): Pt will perform supine to sit with max assist in preparation for selfcare tasks. OT Short Term Goal 4 - Progress (Week 1): Progressing toward goal OT Short Term Goal 5 (Week 1): Pt will perform toilet transfers squat pivot with max assist. OT Short Term Goal 5 - Progress (Week 1): Progressing toward goal  Skilled Therapeutic Interventions: Therapeutic activities with emphasis on stimulation/neuromuscular facilitation of L-UE to include tendon-tapping, vibrations, PROM, and bil arm ergometry using SCIFIT.   Patient required hand-over-hand assist to continue arm ergometry but affirmed satisfaction with activity.   Primary obstacle to full participation this session was lethargy and inattention to task or cues.     Therapy Documentation Precautions:  Precautions Precautions: Fall Precaution Comments: Lt sided hemiparesis Restrictions Weight Bearing Restrictions: No  Pain: Pain Assessment Pain Assessment: Faces Pain Score: 0-No pain Faces Pain Scale: No hurt  See FIM for current functional status  Therapy/Group: Individual Therapy  Georgeanne Nim 01/14/2012, 3:51 PM

## 2012-01-14 NOTE — Progress Notes (Signed)
Patient ID: Collin Diaz, male   DOB: May 19, 1955, 56 y.o.   MRN: [id number] HPI: Collin Diaz is a 56 y.o. 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 Has history of poor controlled BP and had not had meds filled recently. Admitted 12/30/2011 with weakness, slurring of speech with coughing/chocking and blood pressure 254/143. Patient placed on Cardene drip. Cranial CT and imaging revealed right thalamus and basal ganglia hemorrhage with intraventricular hemorrhage and cytotoxic cerebral edema with right to left midline shift. Evaluated by neurology services with tight blood pressure control with SBP<150. Started on 3% saline drip with goal of 155-160 Na to decrease cerebral edema. Noted dysphagia currently on a dysphagia 1 diet   Subjective/Complaints: Shakes head to yes, no questions Denies pain, denies SOB Review of Systems  Unable to perform ROS: medical condition   Objective: Vital Signs: Blood pressure 141/98, pulse 79, temperature 98.5 F (36.9 C), temperature source Oral, resp. rate 20, height 5' 8.9" (1.75 m), weight 230 lb 9.6 oz (104.6 kg), SpO2 100.00%. No results found. Results for orders placed during the hospital encounter of 01/03/12 (from the past 72 hour(s))  URINALYSIS, ROUTINE W REFLEX MICROSCOPIC     Status: Abnormal   Collection Time   01/13/12  5:50 AM      Component Value Range Comment   Color, Urine YELLOW  YELLOW    APPearance CLEAR  CLEAR    Specific Gravity, Urine 1.014  1.005 - 1.030    pH 5.0  5.0 - 8.0    Glucose, UA NEGATIVE  NEGATIVE mg/dL    Hgb urine dipstick MODERATE (*) NEGATIVE    Bilirubin Urine NEGATIVE  NEGATIVE    Ketones, ur NEGATIVE  NEGATIVE mg/dL    Protein, ur 30 (*) NEGATIVE mg/dL    Urobilinogen, UA 0.2  0.0 - 1.0 mg/dL    Nitrite NEGATIVE  NEGATIVE    Leukocytes, UA NEGATIVE  NEGATIVE   URINE  MICROSCOPIC-ADD ON     Status: Normal   Collection Time   01/13/12  5:50 AM      Component Value Range Comment   WBC, UA 0-2  <3 WBC/hpf    RBC / HPF 3-6  <3 RBC/hpf    Bacteria, UA RARE  RARE      Chronically ill  appearing male in no acute distress. HEENT exam atraumatic, normocephalic, neck supple without jugular venous distention. Chest clear to auscultation cardiac exam S1-S2 are regular. Abdominal exam overweight with bowel sounds, soft and nontender. Extremities no edema.    Assessment/Plan: 1. Functional deficits secondary to R thalamic hemorrhage which require 3+ hours per day of interdisciplinary therapy in a comprehensive inpatient rehab setting. Physiatrist is providing close team supervision and 24 hour management of active medical problems listed below.   Medical Problem List and Plan:  1. DVT Prophylaxis/Anticoagulation: Pharmaceutical: Lovenox  2. Pain Management: tylenol  3. Mood: begin low dose celexa, abilify already initiated, but change to HS dosing. Apparently he had been using this as an outpt, but it's unclear how long, why , or if he had been compliant with usage.  4. Neuropsych: This patient is not capable of making decisions on his/her own behalf Reduced intiation  Methylphenidate-d/c if recurrent seizure 5. Chronic renal failure Basic Metabolic Panel:    Component Value Date/Time   NA 143 01/09/2012 0600   K 4.6 01/09/2012 0600   CL 109 01/09/2012  0600   CO2 21 01/09/2012 0600   BUN 64* 01/09/2012 0600   CREATININE 3.48* 01/09/2012 0600   GLUCOSE 117* 01/09/2012 0600   CALCIUM 9.1 01/09/2012 0600    6. HTN: HCTZ, cardura,D/C lisinopril due to elevated BUN -monitor and adjust as indicated  BP Readings from Last 3 Encounters:  01/14/12 141/98  01/03/12 123/74  10/06/11 123/61  7. Dysphagia: D1, pudding thick liquids -could not participate with MBS due to cognition -ivf continuous monitor BMET-aggressive oral hygiene as he has poor oral-motor control ?  PEG 8.  Post stoke seizure d/o  LOS (Days) 11 A FACE TO FACE EVALUATION WAS PERFORMED  Collin Diaz 01/14/2012, 10:33 AM

## 2012-01-15 ENCOUNTER — Inpatient Hospital Stay (HOSPITAL_COMMUNITY): Payer: Medicaid Other

## 2012-01-15 ENCOUNTER — Inpatient Hospital Stay (HOSPITAL_COMMUNITY): Payer: Medicaid Other | Admitting: Physical Therapy

## 2012-01-15 ENCOUNTER — Inpatient Hospital Stay (HOSPITAL_COMMUNITY): Payer: Medicaid Other | Admitting: Speech Pathology

## 2012-01-15 MED ORDER — LISINOPRIL 2.5 MG PO TABS
2.5000 mg | ORAL_TABLET | Freq: Every day | ORAL | Status: DC
  Administered 2012-01-15 – 2012-01-17 (×3): 2.5 mg via ORAL
  Filled 2012-01-15 (×5): qty 1

## 2012-01-15 MED ORDER — SODIUM CHLORIDE 0.45 % IV SOLN
INTRAVENOUS | Status: DC
  Administered 2012-01-16 – 2012-01-18 (×4): via INTRAVENOUS

## 2012-01-15 NOTE — Progress Notes (Signed)
Physical Therapy Session Note  Patient Details  Name: Collin Diaz MRN: [id number] Date of Birth: 06/14/1955  Today's Date: 01/15/2012 Time: 1300-1400 Time Calculation (min): 60 min  Short Term Goals: Week 2:  PT Short Term Goal 1 (Week 2): Patient will initiate 25% of the time for bed mobility training with use of UE support and max A PT Short Term Goal 2 (Week 2): Patient will transfer bed <> w/c with max A and initiate 25% of the time  PT Short Term Goal 3 (Week 2): Patient will perform w/c mobility on unit x 50' with R hemi technique and 75% cues for initiation and sequencing  PT Short Term Goal 4 (Week 2): Patient will tolerate dynamic sitting balance and postural control training activities without back support x 15 minutes with mod-max A PT Short Term Goal 5 (Week 2): Patient will tolerate static standing in appropriate lift equipment for NMR to LLE, trunk control and pre-gait activities x 5 minutes   Skilled Therapeutic Interventions/Progress Updates:   Patient more alert today and attempting to use voice.  Performed w/c mobility training for attention to task, sequencing with R hemi technique and max-total A for sustained attention to task, initiation and sequencing of LE knee extension <> flexion.  Performed one prolonged stand in standing frame x 5-6 minutes total with focus on upright trunk posture and use of RUE reaching up and to the R placing bean bags through goal for R lateral and forward weight shift with tactile cues for extension through LLE.  During sitting rest break continued focus on maintaining upright trunk and trunk control for forward and R lateral leans to reach for bean bags on table and then placing them in goal on R with max A for forward and lateral excursion to the R.  Therapy Documentation Precautions:  Precautions Precautions: Fall Precaution Comments: Lt sided hemiparesis Restrictions Weight Bearing Restrictions: No Pain: Pain Assessment Pain  Assessment: No/denies pain  See FIM for current functional status  Therapy/Group: Individual Therapy  [name] 01/15/2012, 4:41 PM

## 2012-01-15 NOTE — Progress Notes (Signed)
Patient ID: Collin Diaz, male   DOB: [birth year], 56 y.o.   MRN: [id number] HPI: Collin Diaz is a 56 y.o. 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 Has history of poor controlled BP and had not had meds filled recently. Admitted 12/30/2011 with weakness, slurring of speech with coughing/chocking and blood pressure 254/143. Patient placed on Cardene drip. Cranial CT and imaging revealed right thalamus and basal ganglia hemorrhage with intraventricular hemorrhage and cytotoxic cerebral edema with right to left midline shift. Evaluated by neurology services with tight blood pressure control with SBP<150. Started on 3% saline drip with goal of 155-160 Na to decrease cerebral edema. Noted dysphagia currently on a dysphagia 1 diet   Subjective/Complaints: Flat affect minimal verbal output No c/os Po intake of calories improving ~50% meals , fluid intake still poorReview of Systems  Unable to perform ROS: medical condition  Constitutional: Positive for malaise/fatigue.  Neurological: Positive for focal weakness.     Objective: Vital Signs: Blood pressure 140/98, pulse 84, temperature 98.7 F (37.1 C), temperature source Oral, resp. rate 20, height 5' 8.9" (1.75 m), weight 104.6 kg (230 lb 9.6 oz), SpO2 99.00%. No results found. Results for orders placed during the hospital encounter of 01/03/12 (from the past 72 hour(s))  URINALYSIS, ROUTINE W REFLEX MICROSCOPIC     Status: Abnormal   Collection Time   01/13/12  5:50 AM      Component Value Range Comment   Color, Urine YELLOW  YELLOW    APPearance CLEAR  CLEAR    Specific Gravity, Urine 1.014  1.005 - 1.030    pH 5.0  5.0 - 8.0    Glucose, UA NEGATIVE  NEGATIVE mg/dL    Hgb urine dipstick MODERATE (*) NEGATIVE    Bilirubin Urine NEGATIVE  NEGATIVE    Ketones, ur NEGATIVE  NEGATIVE mg/dL    Protein, ur 30  (*) NEGATIVE mg/dL    Urobilinogen, UA 0.2  0.0 - 1.0 mg/dL    Nitrite NEGATIVE  NEGATIVE    Leukocytes, UA NEGATIVE  NEGATIVE   URINE MICROSCOPIC-ADD ON     Status: Normal   Collection Time   01/13/12  5:50 AM      Component Value Range Comment   WBC, UA 0-2  <3 WBC/hpf    RBC / HPF 3-6  <3 RBC/hpf    Bacteria, UA RARE  RARE      HEENT: normal Cardio: RRR Resp: CTA B/L GI: BS positive Extremity:  No Edema Skin:   Intact Neuro: Flat, Cranial Nerve II-XII normal, Abnormal Sensory reduced lt touch on L, Abnormal Motor o/5 on L side, Abnormal FMC Tone  Hypotonia, Dysarthric and Aphasic Musc/Skel:  Swelling Left hand  Assessment/Plan: 1. Functional deficits secondary to R thalamic hemorrhage which require 3+ hours per day of interdisciplinary therapy in a comprehensive inpatient rehab setting. Physiatrist is providing close team supervision and 24 hour management of active medical problems listed below. Physiatrist and rehab team continue to assess barriers to discharge/monitor patient progress toward functional and medical goals. FIM: FIM - Bathing Bathing Steps Patient Completed: Chest;Abdomen;Left upper leg;Right upper leg Bathing: 2: Max-Patient completes 3-4 60f 10 parts or 25-49%  FIM - Upper Body Dressing/Undressing Upper body dressing/undressing steps patient completed: Thread/unthread right sleeve of pullover shirt/dresss Upper body dressing/undressing: 2: Max-Patient completed 25-49% of tasks FIM - Lower Body Dressing/Undressing Lower body dressing/undressing: 1: Two helpers  FIM -  Toileting Toileting: 1: Two helpers  FIM - Archivist Transfers: 0-Activity did not occur  FIM - Banker Devices: Bed rails;Arm rests Bed/Chair Transfer: 1: Supine > Sit: Total A (helper does all/Pt. < 25%);1: Bed > Chair or W/C: Total A (helper does all/Pt. < 25%);1: Two helpers  FIM - Locomotion: Wheelchair Distance: 25 Locomotion:  Wheelchair: 1: Total Assistance/staff pushes wheelchair (Pt<25%) FIM - Locomotion: Ambulation Locomotion: Ambulation: 0: Activity did not occur  Comprehension Comprehension Mode: Auditory Comprehension: 3-Understands basic 50 - 74% of the time/requires cueing 25 - 50%  of the time  Expression Expression Mode: Verbal Expression: 1-Expresses basis less than 25% of the time/requires cueing greater than 75% of the time.  Social Interaction Social Interaction: 2-Interacts appropriately 25 - 49% of time - Needs frequent redirection.  Problem Solving Problem Solving: 2-Solves basic 25 - 49% of the time - needs direction more than half the time to initiate, plan or complete simple activities  Memory Memory: 2-Recognizes or recalls 25 - 49% of the time/requires cueing 51 - 75% of the time   Medical Problem List and Plan:  1. DVT Prophylaxis/Anticoagulation: Pharmaceutical: Lovenox  2. Pain Management: tylenol  3. Mood:  low dose celexa, abilify already initiated, but change to HS dosing. Apparently he had been using this as an outpt, but it's unclear how long, why , or if he had been compliant with usage.  4. Neuropsych: This patient is not capable of making decisions on his/her own behalf Reduced intiation  Methylphenidate-d/c if recurrent seizure 5. Chronic renal failure  6. HTN: HCTZ, cardura,resume  lisinopril-monitor and adjust as indicated  7. Dysphagia: D1, pudding thick liquids -could not participate with MBS due to cognition -ivf continuous monitor BMET-aggressive oral hygiene as he has poor oral-motor control ? PEG 8.  Post stoke seizure d/o  LOS (Days) 12 A FACE TO FACE EVALUATION WAS PERFORMED  Collin Diaz E 01/15/2012, 7:37 AM

## 2012-01-15 NOTE — Progress Notes (Signed)
Speech Language Pathology Daily Session Note  Patient Details  Name: Collin Diaz MRN: [id number] Date of Birth: 29-Jan-1956  Today's Date: 01/15/2012 Time: 8295-6213 Time Calculation (min): 40 min  Short Term Goals: Week 2: SLP Short Term Goal 1 (Week 2): Pt will utilize respiratory support sufficient to sustain phonation for 5 seconds with mod cues.  SLP Short Term Goal 2 (Week 2): Pt will complete effortful swallow in combination with towel tuck in order to improve hyolaryngeal elevation with mod cues. SLP Short Term Goal 3 (Week 2): Pt will tolerate dysphagia 1 diet with pudding thick liquids with mod assist for precautions.  SLP Short Term Goal 4 (Week 2): Pt will produce 1 word utterances with max assist to increase intelligibility.  SLP Short Term Goal 5 (Week 2): Patient will demonstrate ability to participate in an objective swallow study with mod assist.   Skilled Therapeutic Interventions: Treatment session focused on addressing breath support during phonation and swallowing. SLP facilitated session with max assist verbal and visual cues to perform diaphragmatic breathing prior to initiation of sustained phonation "ah."  Patient able to phonate for 2-3 seconds with cues from SLP.  Max assist semantic and phonemic cues were also effective in facilitating a functional naming activity.  Patient's verbal errors were characterized by initial phoneme perseverations vs. dysfluencies, semantic errors and then whole word perseverative errors.  Direct cues were effective at reducing word level perseverative errors.  Patient also demonstrated greater accuracy with automatic verbal sequences such as counting with cues faded to a min assist level during session.  SLP also facilitated session with trials of pudding-thick liquids via spoon with min assist verbal cues to self-monitor portion size and initiate a timely swallow; no overt s/s of aspiration were observed.    FIM:   Comprehension Comprehension Mode: Auditory Comprehension: 3-Understands basic 50 - 74% of the time/requires cueing 25 - 50%  of the time Expression Expression Mode: Verbal Expression: 2-Expresses basic 25 - 49% of the time/requires cueing 50 - 75% of the time. Uses single words/gestures. Social Interaction Social Interaction: 2-Interacts appropriately 25 - 49% of time - Needs frequent redirection. Problem Solving Problem Solving: 2-Solves basic 25 - 49% of the time - needs direction more than half the time to initiate, plan or complete simple activities Memory Memory: 2-Recognizes or recalls 25 - 49% of the time/requires cueing 51 - 75% of the time FIM - Eating Eating Activity: 4: Helper checks for pocketed food  Pain Pain Assessment Pain Assessment: No/denies pain  Therapy/Group: Individual Therapy  Collin Diaz., CCC-SLP [phone]  Collin Diaz 01/15/2012, 4:35 PM

## 2012-01-15 NOTE — Progress Notes (Signed)
Nutrition Follow-up/Calorie Count Follow-Up  Intervention:   1. Recommend nocturnal enteral nutrition regimen to help meet nutritional needs until PO intake improves, as PO intake is only meeting 25% of calorie and <10% of protein needs at this time. Recommend Osmolite 1.5 at 25 ml/hr x 12 hours, advance by 10 ml/hr every 4 hours to goal of 75 ml/hr x 12 hours. 30 ml Prostat via tube BID. This regimen will provide: 1550 kcal, 87 grams protein, 686 ml free water and will provide: 83% kcal needs and 100% protein needs. 2. Discontinue calorie count 3. RD to continue to follow nutrition care plan  Assessment:   Calorie count over the weekend was limited 2/2 very little documentation of PO's.   Daily average intake appears to be 495 kcal, 7 grams protein.  Intake is meeting 26% of calorie needs and 9% of protein needs.  Skin: skin remains intact Labs: reviewed; sodium now WNL Pertinent meds: continues on megace and IVF Last BM: 9/27  Diet Order:  Dysphagia 1 diet, Pudding Thickened Liquids Supplements: Ensure Pudding PO TID  Meds: Scheduled Meds:    . ARIPiprazole  1 mg Oral QHS  . citalopram  10 mg Oral Daily  . doxazosin  2 mg Oral QHS  . enoxaparin  40 mg Subcutaneous Daily  . feeding supplement  1 Container Oral TID BM  . hydrocerin   Topical BID  . levETIRAcetam  500 mg Oral BID  . lisinopril  2.5 mg Oral Daily  . megestrol  400 mg Oral BID  . methylphenidate  10 mg Oral BID WC   Continuous Infusions:    . sodium chloride    . DISCONTD: sodium chloride 75 mL/hr at 01/14/12 1455   PRN Meds:.acetaminophen, alum & mag hydroxide-simeth, bisacodyl, diphenhydrAMINE, food thickener, guaiFENesin-dextromethorphan, ondansetron (ZOFRAN) IV, prochlorperazine, prochlorperazine, prochlorperazine, senna-docusate, traZODone  Labs:  CMP     Component Value Date/Time   NA 143 01/09/2012 0600   K 4.6 01/09/2012 0600   CL 109 01/09/2012 0600   CO2 21 01/09/2012 0600   GLUCOSE 117* 01/09/2012  0600   BUN 64* 01/09/2012 0600   CREATININE 3.48* 01/09/2012 0600   CALCIUM 9.1 01/09/2012 0600   PROT 6.9 01/04/2012 0606   ALBUMIN 3.2* 01/04/2012 0606   AST 23 01/04/2012 0606   ALT 29 01/04/2012 0606   ALKPHOS 90 01/04/2012 0606   BILITOT 0.5 01/04/2012 0606   GFRNONAA 18* 01/09/2012 0600   GFRAA 21* 01/09/2012 0600   Sodium  Date/Time Value Range Status  01/09/2012  6:00 AM 143  135 - 145 mEq/L Final  01/08/2012  2:44 PM 146* 135 - 145 mEq/L Final  01/04/2012  6:06 AM 152* 135 - 145 mEq/L Final    Potassium  Date/Time Value Range Status  01/09/2012  6:00 AM 4.6  3.5 - 5.1 mEq/L Final  01/08/2012  2:44 PM 4.9  3.5 - 5.1 mEq/L Final  01/04/2012  6:06 AM 4.3  3.5 - 5.1 mEq/L Final    Phosphorus  Date/Time Value Range Status  12/31/2011  5:50 AM 2.8  2.3 - 4.6 mg/dL Final    Magnesium  Date/Time Value Range Status  12/31/2011  5:50 AM 1.9  1.5 - 2.5 mg/dL Final   CBG (last 3)  No results found for this basename: GLUCAP:3 in the last 72 hours   Intake/Output Summary (Last 24 hours) at 01/15/12 1216 Last data filed at 01/15/12 0900  Gross per 24 hour  Intake   1020 ml  Output  1675 ml  Net   -655 ml   Weight Status:  230 lb - wt 9/24, no new wt  Body mass index is 34.15 kg/(m^2). Obese Class I  Etimated needs:  1875 -2075 kcal, 80 -90 grams protein  Nutrition Dx:  Inadequate oral intake r/t poor attention AEB poor meal completion.  Goal:  Pt to meet >/= 90% of their estimated nutrition needs  Monitor:  weight trends, lab trends, I/O's, PO intake, supplement tolerance  Jarold Motto MS, RD, LDN Pager: [phone] After-hours pager: [phone]

## 2012-01-15 NOTE — Progress Notes (Signed)
Occupational Therapy Session Note  Patient Details  Name: Collin Diaz MRN: [id number] Date of Birth: Aug 10, 1955  Today's Date: 01/15/2012 Time: 1102-1200 Time Calculation (min): 58 min  Short Term Goals: Week 2:  OT Short Term Goal 1 (Week 2): Pt will maintain static sitting for 5 mins with close supervision in preparation for selfcare tasks OT Short Term Goal 2 (Week 2): Pt will perform LB bathing sit to stand with Total +2 (pt 40%). OT Short Term Goal 3 (Week 2): Pt will perfrom UB dressing in supportive sitting with min facilitation.  OT Short Term Goal 4 (Week 2): Pt will perform supine to sit with max assist in preparation for selfcare tasks OT Short Term Goal 5 (Week 2): Pt will perform toilet transfers squat pivot with max assist.  Skilled Therapeutic Interventions/Progress Updates:    Pt seen for ADL retraining with focus on bed mobility, trunk control in dynamic and static sitting, increased initiation and participation in self-care tasks of bathing and dressing.  Pt seated at EOB with min-mod assist to maintain upright and midline. 2nd person assist to maintain sitting balance while completing bathing tasks.  Required mod-max tactile and verbal cues for initiation with bathing, however able to sequence to next bathing steps with min verbal cues. Pt demonstrated mobility in Lt shoulder when withdrawing from pain (removing tape from forearm), however unable to replicate movement when isolated.  Pt demonstrated increased ability to bend forward to wash legs (except feet) with decreased cues.  Engaged in sit to stand x2 with max assist +2 at EOB and at sink with use of mirror for visual cues.  Pt required occasional tactile and verbal cues to remain alert.  Therapy Documentation Precautions:  Precautions Precautions: Fall Precaution Comments: Lt sided hemiparesis Restrictions Weight Bearing Restrictions: No Pain:  Pt with no c/o pain this session  See FIM for current functional  status  Therapy/Group: Individual Therapy  Leonette Monarch 01/15/2012, 12:17 PM

## 2012-01-15 NOTE — Progress Notes (Signed)
Patient ID: Collin Diaz, male   DOB: August 10, 1955, 56 y.o.   MRN: [id number] HPI: Collin Diaz is a 56 y.o. 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 Has history of poor controlled BP and had not had meds filled recently. Admitted 12/30/2011 with weakness, slurring of speech with coughing/chocking and blood pressure 254/143. Patient placed on Cardene drip. Cranial CT and imaging revealed right thalamus and basal ganglia hemorrhage with intraventricular hemorrhage and cytotoxic cerebral edema with right to left midline shift. Evaluated by neurology services with tight blood pressure control with SBP<150. Started on 3% saline drip with goal of 155-160 Na to decrease cerebral edema. Noted dysphagia currently on a dysphagia 1 diet   Subjective/Complaints: Flat affect minimal verbal output No c/os Po intake of calories dropping off megace started.  Discussed TF next week if po does not improve Food choices given D1 pudding thick diet seem to be an obstacle Review of Systems  Unable to perform ROS: medical condition  Constitutional: Positive for malaise/fatigue.  Neurological: Positive for focal weakness.     Objective: Vital Signs: Blood pressure 140/98, pulse 84, temperature 98.7 F (37.1 C), temperature source Oral, resp. rate 20, height 5' 8.9" (1.75 m), weight 104.6 kg (230 lb 9.6 oz), SpO2 99.00%. No results found. Results for orders placed during the hospital encounter of 01/03/12 (from the past 72 hour(s))  URINALYSIS, ROUTINE W REFLEX MICROSCOPIC     Status: Abnormal   Collection Time   01/13/12  5:50 AM      Component Value Range Comment   Color, Urine YELLOW  YELLOW    APPearance CLEAR  CLEAR    Specific Gravity, Urine 1.014  1.005 - 1.030    pH 5.0  5.0 - 8.0    Glucose, UA NEGATIVE  NEGATIVE mg/dL    Hgb urine dipstick MODERATE (*) NEGATIVE    Bilirubin Urine NEGATIVE  NEGATIVE    Ketones, ur NEGATIVE  NEGATIVE mg/dL    Protein, ur 30 (*) NEGATIVE mg/dL    Urobilinogen, UA 0.2  0.0 - 1.0 mg/dL    Nitrite NEGATIVE  NEGATIVE    Leukocytes, UA NEGATIVE  NEGATIVE   URINE MICROSCOPIC-ADD ON     Status: Normal   Collection Time   01/13/12  5:50 AM      Component Value Range Comment   WBC, UA 0-2  <3 WBC/hpf    RBC / HPF 3-6  <3 RBC/hpf    Bacteria, UA RARE  RARE      HEENT: normal Cardio: RRR Resp: CTA B/L GI: BS positive Extremity:  No Edema Skin:   Intact Neuro: Flat, Cranial Nerve II-XII normal, Abnormal Sensory reduced lt touch on L, Abnormal Motor o/5 on L side, Abnormal FMC Tone  Hypotonia, Dysarthric and Aphasic Musc/Skel:  Swelling Left hand  Assessment/Plan: 1. Functional deficits secondary to R thalamic hemorrhage which require 3+ hours per day of interdisciplinary therapy in a comprehensive inpatient rehab setting. Physiatrist is providing close team supervision and 24 hour management of active medical problems listed below. Physiatrist and rehab team continue to assess barriers to discharge/monitor patient progress toward functional and medical goals. FIM: FIM - Bathing Bathing Steps Patient Completed: Chest;Abdomen;Left upper leg;Right upper leg Bathing: 2: Max-Patient completes 3-4 45f 10 parts or 25-49%  FIM - Upper Body Dressing/Undressing Upper body dressing/undressing steps patient completed: Thread/unthread right sleeve of pullover shirt/dresss Upper body dressing/undressing: 2:  Max-Patient completed 25-49% of tasks FIM - Lower Body Dressing/Undressing Lower body dressing/undressing: 1: Two helpers  FIM - Toileting Toileting: 1: Two helpers  FIM - Archivist Transfers: 0-Activity did not occur  FIM - Banker Devices: Bed rails;Arm rests Bed/Chair Transfer: 1: Supine > Sit: Total A (helper does all/Pt. < 25%);1: Bed > Chair or W/C: Total A (helper  does all/Pt. < 25%);1: Two helpers  FIM - Locomotion: Wheelchair Distance: 25 Locomotion: Wheelchair: 1: Total Assistance/staff pushes wheelchair (Pt<25%) FIM - Locomotion: Ambulation Locomotion: Ambulation: 0: Activity did not occur  Comprehension Comprehension Mode: Auditory Comprehension: 3-Understands basic 50 - 74% of the time/requires cueing 25 - 50%  of the time  Expression Expression Mode: Verbal Expression: 1-Expresses basis less than 25% of the time/requires cueing greater than 75% of the time.  Social Interaction Social Interaction: 2-Interacts appropriately 25 - 49% of time - Needs frequent redirection.  Problem Solving Problem Solving: 2-Solves basic 25 - 49% of the time - needs direction more than half the time to initiate, plan or complete simple activities  Memory Memory: 2-Recognizes or recalls 25 - 49% of the time/requires cueing 51 - 75% of the time   Medical Problem List and Plan:  1. DVT Prophylaxis/Anticoagulation: Pharmaceutical: Lovenox  2. Pain Management: tylenol  3. Mood:  low dose celexa, abilify already initiated, but change to HS dosing. Apparently he had been using this as an outpt, but it's unclear how long, why , or if he had been compliant with usage.  4. Neuropsych: This patient is not capable of making decisions on his/her own behalf Reduced intiation  Methylphenidate-d/c if recurrent seizure 5. Chronic renal failure  6. HTN: HCTZ, cardura,D/C lisinopril due to elevated BUN -monitor and adjust as indicated  7. Dysphagia: D1, pudding thick liquids -could not participate with MBS due to cognition -ivf continuous monitor BMET-aggressive oral hygiene as he has poor oral-motor control ? PEG 8.  Post stoke seizure d/o  LOS (Days) 12 A FACE TO FACE EVALUATION WAS PERFORMED  [name],[name] 01/15/2012, 7:34 AM

## 2012-01-16 ENCOUNTER — Inpatient Hospital Stay (HOSPITAL_COMMUNITY): Payer: Medicaid Other | Admitting: Physical Therapy

## 2012-01-16 ENCOUNTER — Inpatient Hospital Stay (HOSPITAL_COMMUNITY): Payer: Medicaid Other | Admitting: Speech Pathology

## 2012-01-16 ENCOUNTER — Inpatient Hospital Stay (HOSPITAL_COMMUNITY): Payer: Medicaid Other | Admitting: Occupational Therapy

## 2012-01-16 DIAGNOSIS — G8110 Spastic hemiplegia affecting unspecified side: Secondary | ICD-10-CM

## 2012-01-16 DIAGNOSIS — I619 Nontraumatic intracerebral hemorrhage, unspecified: Secondary | ICD-10-CM

## 2012-01-16 DIAGNOSIS — Z5189 Encounter for other specified aftercare: Secondary | ICD-10-CM

## 2012-01-16 NOTE — Progress Notes (Signed)
Patient ID: Collin Diaz, male   DOB: Aug 26, 1955, 56 y.o.   MRN: [id number] HPI: Collin Diaz is a 56 y.o. 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 Has history of poor controlled BP and had not had meds filled recently. Admitted 12/30/2011 with weakness, slurring of speech with coughing/chocking and blood pressure 254/143. Patient placed on Cardene drip. Cranial CT and imaging revealed right thalamus and basal ganglia hemorrhage with intraventricular hemorrhage and cytotoxic cerebral edema with right to left midline shift. Evaluated by neurology services with tight blood pressure control with SBP<150. Started on 3% saline drip with goal of 155-160 Na to decrease cerebral edema. Noted dysphagia currently on a dysphagia 1 diet   Subjective/Complaints: Flat affect minimal verbal output No c/os Po intake of caloriesIs variable only to 10% of meals yesterday, fluid intake still poorReview of Systems  Unable to perform ROS: medical condition  Constitutional: Positive for malaise/fatigue.  Neurological: Positive for focal weakness.     Objective: Vital Signs: Blood pressure 137/95, pulse 73, temperature 97.9 F (36.6 C), temperature source Oral, resp. rate 17, height 5' 8.9" (1.75 m), weight 102 kg (224 lb 13.9 oz), SpO2 98.00%. No results found. No results found for this or any previous visit (from the past 72 hour(s)).   HEENT: normal Cardio: RRR Resp: CTA B/L GI: BS positive Extremity:  No Edema Skin:   Intact Neuro: Flat, Cranial Nerve II-XII normal, Abnormal Sensory reduced lt touch on L, Abnormal Motor o/5 on L side, Abnormal FMC Tone  Hypotonia, Dysarthric and Aphasic Musc/Skel:  Swelling Left hand  Assessment/Plan: 1. Functional deficits secondary to R thalamic hemorrhage which require 3+ hours per day of interdisciplinary therapy in a  comprehensive inpatient rehab setting. Physiatrist is providing close team supervision and 24 hour management of active medical problems listed below. Physiatrist and rehab team continue to assess barriers to discharge/monitor patient progress toward functional and medical goals. FIM: FIM - Bathing Bathing Steps Patient Completed: Chest;Abdomen;Right upper leg;Left upper leg Bathing: 2: Max-Patient completes 3-4 29f 10 parts or 25-49%  FIM - Upper Body Dressing/Undressing Upper body dressing/undressing steps patient completed: Thread/unthread right sleeve of pullover shirt/dresss;Put head through opening of pull over shirt/dress Upper body dressing/undressing: 2: Max-Patient completed 25-49% of tasks FIM - Lower Body Dressing/Undressing Lower body dressing/undressing: 1: Two helpers  FIM - Toileting Toileting: 1: Two helpers  FIM - Archivist Transfers: 0-Activity did not occur  FIM - Banker Devices: Bed rails;Arm rests Bed/Chair Transfer: 1: Supine > Sit: Total A (helper does all/Pt. < 25%);1: Bed > Chair or W/C: Total A (helper does all/Pt. < 25%)  FIM - Locomotion: Wheelchair Distance: 40 Locomotion: Wheelchair: 1: Travels less than 50 ft with maximal assistance (Pt: 25 - 49%) FIM - Locomotion: Ambulation Locomotion: Ambulation: 0: Activity did not occur  Comprehension Comprehension Mode: Auditory Comprehension: 3-Understands basic 50 - 74% of the time/requires cueing 25 - 50%  of the time  Expression Expression Mode: Verbal Expression: 2-Expresses basic 25 - 49% of the time/requires cueing 50 - 75% of the time. Uses single words/gestures.  Social Interaction Social Interaction: 2-Interacts appropriately 25 - 49% of time - Needs frequent redirection.  Problem Solving Problem Solving: 2-Solves basic 25 - 49% of the time - needs direction more than half the time to initiate, plan or complete simple  activities  Memory Memory: 2-Recognizes or recalls 25 -  49% of the time/requires cueing 51 - 75% of the time   Medical Problem List and Plan:  1. DVT Prophylaxis/Anticoagulation: Pharmaceutical: Lovenox  2. Pain Management: tylenol  3. Mood:  low dose celexa, abilify already initiated, but change to HS dosing. Apparently he had been using this as an outpt, but it's unclear how long, why , or if he had been compliant with usage.  4. Neuropsych: This patient is not capable of making decisions on his/her own behalf Reduced intiation  Methylphenidate-d/c if recurrent seizure 5. Chronic renal failure  6. HTN: HCTZ, cardura,resume  lisinopril-monitor and adjust as indicated  7. Dysphagia: D1, pudding thick liquids -could not participate with MBS due to cognition -ivf At night monitor BMET-aggressive oral hygiene as he has poor oral-motor control ? PEG 8.  Post stoke seizure d/o  LOS (Days) 13 A FACE TO FACE EVALUATION WAS PERFORMED  Liylah Najarro E 01/16/2012, 8:18 AM

## 2012-01-16 NOTE — Progress Notes (Signed)
Occupational Therapy Session Note  Patient Details  Name: Collin Diaz MRN: [id number] Date of Birth: 02-01-1956  Today's Date: 01/16/2012 Time: 4098-1191 Time Calculation (min): 60 min  Short Term Goals: Week 2:  OT Short Term Goal 1 (Week 2): Pt will maintain static sitting for 5 mins with close supervision in preparation for selfcare tasks OT Short Term Goal 2 (Week 2): Pt will perform LB bathing sit to stand with Total +2 (pt 40%). OT Short Term Goal 3 (Week 2): Pt will perfrom UB dressing in supportive sitting with min facilitation.  OT Short Term Goal 4 (Week 2): Pt will perform supine to sit with max assist in preparation for selfcare tasks OT Short Term Goal 5 (Week 2): Pt will perform toilet transfers squat pivot with max assist.  Skilled Therapeutic Interventions/Progress Updates:    Pt seen for ADL retraining with focus on bed mobility, static and dynamic sitting balance, trunk control, and increased initiation and participation in self-care tasks of bathing and dressing.  Pt completed front perineal hygiene in bed prior to bathing at EOB.  Pt continues to require max-total assist +2 for supine to sit, however able to complete rolling to Lt with increased participation (approx 25%).  While completing bathing at EOB, pt verbalized need to use commode.  +2 total assist squat pivot transfer from bed to [facility].  Pt voided and had BM, required assist +2 for sit to stand to complete hygiene and clothing management.  Focus on weight shifting to incorporate forward lean to complete LB bathing and dressing, pt continues to require increased assist to initiate and follow through with weight shifting.  Therapy Documentation Precautions:  Precautions Precautions: Fall Precaution Comments: Lt sided hemiparesis Restrictions Weight Bearing Restrictions: No Pain: Pain Assessment Pain Assessment: No/denies pain  See FIM for current functional status  Therapy/Group: Individual  Therapy  Leonette Monarch 01/16/2012, 12:18 PM

## 2012-01-16 NOTE — Progress Notes (Signed)
Speech Language Pathology Daily Session Note  Patient Details  Name: Collin Diaz MRN: [id number] Date of Birth: 1956-02-08  Today's Date: 01/16/2012 Time: 4098-1191 Time Calculation (min): 40 min  Short Term Goals: Week 2: SLP Short Term Goal 1 (Week 2): Pt will utilize respiratory support sufficient to sustain phonation for 5 seconds with mod cues.  SLP Short Term Goal 2 (Week 2): Pt will complete effortful swallow in combination with towel tuck in order to improve hyolaryngeal elevation with mod cues. SLP Short Term Goal 3 (Week 2): Pt will tolerate dysphagia 1 diet with pudding thick liquids with mod assist for precautions.  SLP Short Term Goal 4 (Week 2): Pt will produce 1 word utterances with max assist to increase intelligibility.  SLP Short Term Goal 5 (Week 2): Patient will demonstrate ability to participate in an objective swallow study with mod assist.   Skilled Therapeutic Interventions: Treatment session focused on addressing breath support during phonation and swallowing. SLP facilitated session with max assist verbal and visual cues to perform diaphragmatic breathing prior to initiation of sustained phonation "ah." Patient able to phonate for 2-3 seconds with cues from SLP. Max assist written, semantic and phonemic cues were effective in completing automatic verbal sequences. Patient's verbal expression was characterized by aphonia, initial phoneme perseverations vs. dysfluencies, and perseverative errors. Direct cues were effective at increasing phonation, reducing word level perseverative errors. SLP also facilitated session with trials of pudding-thick liquids via spoon with mod-max assist verbal cues to self-monitor portion size and initiate a timely swallow; no overt s/s of aspiration were observed.  Inconsistent progress today due to increased bolus holding up to 1 minute at times will continue to assess for objective stud readiness.    FIM:  Comprehension Comprehension  Mode: Auditory Comprehension: 2-Understands basic 25 - 49% of the time/requires cueing 51 - 75% of the time Expression Expression Mode: Verbal Expression: 2-Expresses basic 25 - 49% of the time/requires cueing 50 - 75% of the time. Uses single words/gestures. Social Interaction Social Interaction: 2-Interacts appropriately 25 - 49% of time - Needs frequent redirection. Problem Solving Problem Solving: 1-Solves basic less than 25% of the time - needs direction nearly all the time or does not effectively solve problems and may need a restraint for safety Memory Memory: 2-Recognizes or recalls 25 - 49% of the time/requires cueing 51 - 75% of the time  Pain Pain Assessment Pain Assessment: No/denies pain  Therapy/Group: Individual Therapy  Charlane Ferretti., CCC-SLP [phone]  Collin Diaz 01/16/2012, 2:53 PM

## 2012-01-16 NOTE — Progress Notes (Signed)
Physical Therapy Session Note  Patient Details  Name: Collin Diaz MRN: [id number] Date of Birth: 1956/01/08  Today's Date: 01/16/2012 Time: 1120-1200 and 1515-1600 Time Calculation (min): 40 min and 45 min  Short Term Goals: Week 2:  PT Short Term Goal 1 (Week 2): Patient will initiate 25% of the time for bed mobility training with use of UE support and max A PT Short Term Goal 2 (Week 2): Patient will transfer bed <> w/c with max A and initiate 25% of the time  PT Short Term Goal 3 (Week 2): Patient will perform w/c mobility on unit x 50' with R hemi technique and 75% cues for initiation and sequencing  PT Short Term Goal 4 (Week 2): Patient will tolerate dynamic sitting balance and postural control training activities without back support x 15 minutes with mod-max A PT Short Term Goal 5 (Week 2): Patient will tolerate static standing in appropriate lift equipment for NMR to LLE, trunk control and pre-gait activities x 5 minutes   Therapy Documentation Precautions:  Precautions Precautions: Fall Precaution Comments: Lt sided hemiparesis Restrictions Weight Bearing Restrictions: No Pain: Pain Assessment Pain Assessment: No/denies pain Other Treatments: Treatments Neuromuscular Facilitation: Left;Upper Extremity;Lower Extremity;Activity to increase lateral weight shifting;Activity to increase anterior-posterior weight shifting;Activity to increase timing and sequencing first in sitting in [place] plus with use of sling and machine to bring patient into anterior pelvic tilt and thoracic extension during reaching with RUE out of BOS forward and to the R to facilitate forward and lateral weight shifts and trunk excursion; standing in [place] x 2 reps with total A to maintain WB through LUE (secondary to increased flexor tone) with focus on maintaining upright trunk posture and activation of LLE hip and knee extension during RUE reaching and weight shifts to R with manual facilitation of L  shoulder depression and retraction; very trace activation of L quad noted, unable to sustain.  PM session: patient requesting to call his wife; assisted patient with holding phone while patient dialed number multiple times; when therapist attempted to dial phone number has been disconnected; will contact SW.  Performed passive stretching to LLE in supine and sidelying for increased hip and knee ROM to allow for increased hip and knee extension in WB positions; performed supine and sidelying NMR to LLE with focus on activation of quad, hamstring and glutes in gravity minimized positions to mimic movements needed to assist with bed mobility and gait.  With lower trunk rotated to R performed LUE NMR D2 flexion <> extension pattern with AAROM reaching across midline for target on R to facilitate upper trunk rotation and increased LUE and shoulder ROM and to minimize flexor tone and to assist with bed mobility/rolling sequence.  At end of session patient repositioned in supine.    See FIM for current functional status  Therapy/Group: Individual Therapy  [name] 01/16/2012, 12:14 PM

## 2012-01-17 ENCOUNTER — Inpatient Hospital Stay (HOSPITAL_COMMUNITY): Payer: Medicaid Other | Admitting: Physical Therapy

## 2012-01-17 ENCOUNTER — Inpatient Hospital Stay (HOSPITAL_COMMUNITY): Payer: Medicaid Other | Admitting: Occupational Therapy

## 2012-01-17 ENCOUNTER — Inpatient Hospital Stay (HOSPITAL_COMMUNITY): Payer: Medicaid Other | Admitting: Speech Pathology

## 2012-01-17 MED ORDER — RESOURCE THICKENUP CLEAR PO POWD
ORAL | Status: DC | PRN
  Filled 2012-01-17: qty 125

## 2012-01-17 NOTE — Patient Care Conference (Signed)
Inpatient RehabilitationTeam Conference Note Date: 01/18/2012   Time: 10:45 AM    Patient Name: Collin Diaz      Medical Record Number: [id number]  Date of Birth: Dec 29, 1955 Sex: Male         Room/Bed: 4032/4032-01 Payor Info: Payor: MEDICAID PENDING  Plan: MEDICAID PENDING  Product Type: *No Product type*     Admitting Diagnosis: ICH  Admit Date/Time:  01/03/2012  4:50 PM Admission Comments: No comment available   Primary Diagnosis:  Thalamic hemorrhage Principal Problem: Thalamic hemorrhage  Patient Active Problem List   Diagnosis Date Noted  . Syncope 01/09/2012  . Induced hypernatremia 01/03/2012  . Obesity (BMI 30.0-34.9) 01/03/2012  . OSA (obstructive sleep apnea) 01/03/2012  . Renal insufficiency 01/03/2012  . Chronic ischemic left MCA stroke 01/03/2012  . Aphasia as late effect of cerebrovascular accident 01/03/2012  . Depression 01/03/2012  . 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 ALLERGIC RHINITIS 02/07/2007  . FACIAL RASH 02/07/2007  . GOUT NOS 08/18/2006  . Malignant hypertension 08/18/2006  . KIDNEY DISEASE, CHRONIC, STAGE V 08/18/2006    Expected Discharge Date:    Team Members Present: Physician: Dr. Claudette Diaz Social Worker Present: Collin Der, LCSW Nurse Present: Collin Roes, RN PT Present: Collin Diaz, PT;Other (comment) Collin Diaz-PT) OT Present: Collin Diaz, Collin Diaz, OT SLP Present: Collin Diaz, SLP     Current Status/Progress Goal Weekly Team Focus  Medical   Lethargy continues, poor oral intake  Improved intake  Assess the need for enteral feeds   Bowel/Bladder             Swallow/Nutrition/ Hydration   Dys.1 textures and pudding thick liquids with min-mod assist  least restrictive p.o. intake  re-assess for least restrictive p.o. intake with objective study   ADL's   mod assist bathing, mod assist grooming, max assist UB dressing, +2 LBdressing and transfers.  Pt demonstrating increased initiation, however requires  increased time and cues to follow task through to completion  mod assist bathing, LB dsg, toilet and tub/shower transfer, supervision grooming and UB dsg  trunk control with static and dynamic sitting, increased initiation and follow through of tasks   Mobility   total A  min-mod A overall  activity tolerance, attention to task, postural control, transfers   Communication   max assist   mod assist   increase initiation and sustained attention   Safety/Cognition/ Behavioral Observations  max assist   mod assist  increase sustained attention and initiation   Pain             Skin                *See Interdisciplinary Assessment and Plan and progress notes for long and short-term goals  Barriers to Discharge: Heavy physical assistance required    Possible Resolutions to Barriers:  SNF versus 24 7 caregiver    Discharge Planning/Teaching Needs:  Wife reports wants to tak pt home with help of two nieces-unsure she realizes the amount of care pt requires and is realistic regarding his care.      Team Discussion:  Pt more alert and able to participate more in therapies. MBS tomorrow.  Ques PEG.  Discussing with wife realistic discharge plan.  Revisions to Treatment Plan:  NOne   Continued Need for Acute Rehabilitation Level of Care: The patient requires daily medical management by a physician with specialized training in physical medicine and rehabilitation for the following conditions: Daily direction of a multidisciplinary physical rehabilitation program to  ensure safe treatment while eliciting the highest outcome that is of practical value to the patient.: Yes Daily medical management of patient stability for increased activity during participation in an intensive rehabilitation regime.: Yes Daily analysis of laboratory values and/or radiology reports with any subsequent need for medication adjustment of medical intervention for : Neurological problems  Collin Diaz, Collin Diaz 01/19/2012,  1:38 PM

## 2012-01-17 NOTE — Progress Notes (Signed)
Speech Language Pathology Daily Session Note  Patient Details  Name: Collin Diaz MRN: [id number] Date of Birth: 09-17-1955  Today's Date: 01/17/2012 Time: 0915-1000 Time Calculation (min): 45 min  Short Term Goals: Week 2: SLP Short Term Goal 1 (Week 2): Pt will utilize respiratory support sufficient to sustain phonation for 5 seconds with mod cues.  SLP Short Term Goal 2 (Week 2): Pt will complete effortful swallow in combination with towel tuck in order to improve hyolaryngeal elevation with mod cues. SLP Short Term Goal 3 (Week 2): Pt will tolerate dysphagia 1 diet with pudding thick liquids with mod assist for precautions.  SLP Short Term Goal 4 (Week 2): Pt will produce 1 word utterances with max assist to increase intelligibility.  SLP Short Term Goal 5 (Week 2): Patient will demonstrate ability to participate in an objective swallow study with mod assist.   Skilled Therapeutic Interventions: Treatment session focused on addressing breath support during phonation and swallowing. SLP facilitated session with mod assist verbal and visual cues to perform diaphragmatic breathing with phonation.  Patient able to coordinate breath with 2 word utterances today during counting and 1 word during expression of wants and needs.  Patient's verbal expression was characterized by decreased perseverations/dysfluencies errors today. SLP also facilitated session with trials of pudding-thick liquids via spoon and yogurt with min assist verbal cues to self-monitor portion size and initiate a timely swallow; no overt s/s of aspiration were observed. Recommend an objective study tomorrow.   FIM:  Comprehension Comprehension Mode: Auditory Comprehension: 3-Understands basic 50 - 74% of the time/requires cueing 25 - 50%  of the time Expression Expression Mode: Verbal Expression: 2-Expresses basic 25 - 49% of the time/requires cueing 50 - 75% of the time. Uses single words/gestures. Social  Interaction Social Interaction: 3-Interacts appropriately 50 - 74% of the time - May be physically or verbally inappropriate. Problem Solving Problem Solving: 2-Solves basic 25 - 49% of the time - needs direction more than half the time to initiate, plan or complete simple activities Memory Memory: 2-Recognizes or recalls 25 - 49% of the time/requires cueing 51 - 75% of the time  Pain Pain Assessment Pain Assessment: No/denies pain Faces Pain Scale: Hurts even more Pain Type: Other (Comment) (Pain secondary to foley catheter placement)  Therapy/Group: Individual Therapy  Charlane Ferretti., CCC-SLP [phone]  Collin Diaz 01/17/2012, 4:48 PM

## 2012-01-17 NOTE — Progress Notes (Signed)
Physical Therapy Session Note  Patient Details  Name: Collin Diaz MRN: [id number] Date of Birth: [birth year]  Today's Date: 01/17/2012 Time: 1400-1450 Time Calculation (min): 50 min  Short Term Goals: Week 2:  PT Short Term Goal 1 (Week 2): Patient will initiate 25% of the time for bed mobility training with use of UE support and max A PT Short Term Goal 1 - Progress (Week 2): Progressing toward goal PT Short Term Goal 2 (Week 2): Patient will transfer bed <> w/c with max A and initiate 25% of the time  PT Short Term Goal 2 - Progress (Week 2): Progressing toward goal PT Short Term Goal 3 (Week 2): Patient will perform w/c mobility on unit x 50' with R hemi technique and 75% cues for initiation and sequencing  PT Short Term Goal 3 - Progress (Week 2): Progressing toward goal PT Short Term Goal 4 (Week 2): Patient will tolerate dynamic sitting balance and postural control training activities without back support x 15 minutes with mod-max A PT Short Term Goal 4 - Progress (Week 2): Progressing toward goal PT Short Term Goal 5 (Week 2): Patient will tolerate static standing in appropriate lift equipment for NMR to LLE, trunk control and pre-gait activities x 5 minutes  PT Short Term Goal 5 - Progress (Week 2): Progressing toward goal     Therapy Documentation Precautions:  Precautions Precautions: Fall Precaution Comments: Lt sided hemiparesis Restrictions Weight Bearing Restrictions: No General: Amount of Missed PT Time (min): 10 Minutes Missed Time Reason: Nursing care (Foley catheter had to be changed)   Pain: Pain Assessment Pain Assessment: Faces Faces Pain Scale: Hurts even more Pain Type: Other (Comment) (Pain secondary to foley catheter placement)    Other Treatments:  Patient up in w/c upon arrival of PT. PT propelled patient to gym in w/c. Attempted sliding board transfer from w/c > mat, however patient unable to compete due to pain secondary to recent catheter  placement. RN notified and requested patient be in bed to change. Patient was returned to room and transferred from w/c > bed with sliding board using three person assist for time and safety. Patient missed ten minutes of therapy to remove and insert new foley. Patient verbalized wanting to continue with therapy. Patient transferred from supine <> sit at EOB with two person assist.  At edge of bed patient attempted to perform forward flexion with BUE supported on bedside table with emphasis on postural/trunk control. Patient again presented with pain resulting in spastic type movements in BUE and BLE. Task changed to lateral reaching to the R with RUE on green physioball with emphasis on postural control and trunk elongation. Progressed to reaching R and forward. Patient tolerated exercise well, with no c/o pain. Patient was returned to supine in bed with call bell in reach at end of session.  See FIM for current functional status  Therapy/Group: Individual Therapy  Rickardo Brinegar [place] DPT Student  01/17/2012, 3:11 PM

## 2012-01-17 NOTE — Plan of Care (Signed)
Problem: RH BLADDER ELIMINATION Goal: RH STG MANAGE BLADDER WITH ASSISTANCE STG Manage Bladder With min Assistance continent bladder  Outcome: Not Progressing Patient with no void in 8 hours. I/O cath for no void.

## 2012-01-17 NOTE — Progress Notes (Signed)
Patient ID: Collin Diaz, male   DOB: 22-Nov-1955, 56 y.o.   MRN: [id number] HPI: Collin Diaz is a 56 y.o. 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 Has history of poor controlled BP and had not had meds filled recently. Admitted 12/30/2011 with weakness, slurring of speech with coughing/chocking and blood pressure 254/143. Patient placed on Cardene drip. Cranial CT and imaging revealed right thalamus and basal ganglia hemorrhage with intraventricular hemorrhage and cytotoxic cerebral edema with right to left midline shift. Evaluated by neurology services with tight blood pressure control with SBP<150. Started on 3% saline drip with goal of 155-160 Na to decrease cerebral edema. Noted dysphagia currently on a dysphagia 1 diet   Subjective/Complaints: Flat affect minimal verbal output No c/os requiring mod A to sit with therapyReview of Systems  Unable to perform ROS: medical condition  Constitutional: Positive for malaise/fatigue.  Neurological: Positive for focal weakness.     Objective: Vital Signs: Blood pressure 144/98, pulse 75, temperature 98.7 F (37.1 C), temperature source Oral, resp. rate 18, height 5' 8.9" (1.75 m), weight 102 kg (224 lb 13.9 oz), SpO2 98.00%. No results found. No results found for this or any previous visit (from the past 72 hour(s)).   HEENT: normal Cardio: RRR Resp: CTA B/L GI: BS positive Extremity:  No Edema Skin:   Intact Neuro: Flat, Cranial Nerve II-XII normal, Abnormal Sensory reduced lt touch on L, Abnormal Motor o/5 on L side, Abnormal FMC Tone  Hypotonia, Dysarthric and Aphasic Musc/Skel:  Swelling Left hand  Assessment/Plan: 1. Functional deficits secondary to R thalamic hemorrhage which require 3+ hours per day of interdisciplinary therapy in a comprehensive inpatient rehab setting. Physiatrist is providing  close team supervision and 24 hour management of active medical problems listed below. Physiatrist and rehab team continue to assess barriers to discharge/monitor patient progress toward functional and medical goals. FIM: FIM - Bathing Bathing Steps Patient Completed: Chest;Front perineal area;Right upper leg;Left upper leg;Abdomen Bathing: 3: Mod-Patient completes 5-7 25f 10 parts or 50-74%  FIM - Upper Body Dressing/Undressing Upper body dressing/undressing steps patient completed: Thread/unthread right sleeve of pullover shirt/dresss;Put head through opening of pull over shirt/dress Upper body dressing/undressing: 2: Max-Patient completed 25-49% of tasks FIM - Lower Body Dressing/Undressing Lower body dressing/undressing: 1: Two helpers  FIM - Toileting Toileting: 1: Two helpers  FIM - Diplomatic Services operational officer Devices: Psychiatrist Transfers: 1-To toilet/BSC: Total A (helper does all/Pt. < 25%);1-From toilet/BSC: Total A (helper does all/Pt. < 25%);1-Two helpers  FIM - Banker Devices: Bed rails;Arm rests Bed/Chair Transfer: 1: Supine > Sit: Total A (helper does all/Pt. < 25%);1: Bed > Chair or W/C: Total A (helper does all/Pt. < 25%);1: Two helpers  FIM - Locomotion: Wheelchair Distance: 40 Locomotion: Wheelchair: 1: Travels less than 50 ft with maximal assistance (Pt: 25 - 49%) FIM - Locomotion: Ambulation Locomotion: Ambulation: 0: Activity did not occur  Comprehension Comprehension Mode: Auditory Comprehension: 2-Understands basic 25 - 49% of the time/requires cueing 51 - 75% of the time  Expression Expression Mode: Verbal Expression: 2-Expresses basic 25 - 49% of the time/requires cueing 50 - 75% of the time. Uses single words/gestures.  Social Interaction Social Interaction: 2-Interacts appropriately 25 - 49% of time - Needs frequent redirection.  Problem Solving Problem Solving: 1-Solves basic less  than 25% of the time - needs direction nearly all the time  or does not effectively solve problems and may need a restraint for safety  Memory Memory: 2-Recognizes or recalls 25 - 49% of the time/requires cueing 51 - 75% of the time   Medical Problem List and Plan:  1. DVT Prophylaxis/Anticoagulation: Pharmaceutical: Lovenox  2. Pain Management: tylenol  3. Mood:  low dose celexa, abilify already initiated, but change to HS dosing. Apparently he had been using this as an outpt, but it's unclear how long, why , or if he had been compliant with usage.  4. Neuropsych: This patient is not capable of making decisions on his/her own behalf Reduced intiation  Methylphenidate-d/c if recurrent seizure 5. Chronic renal failure  6. HTN: HCTZ, cardura,resume  lisinopril-monitor and adjust as indicated  7. Dysphagia: D1, pudding thick liquids -could not participate with MBS due to cognition -ivf At night monitor BMET-aggressive oral hygiene as he has poor oral-motor control ? PEG, no significant improvement with megace 8.  Post stoke seizure d/o  LOS (Days) 14 A FACE TO FACE EVALUATION WAS PERFORMED  Cordale Manera E 01/17/2012, 8:27 AM

## 2012-01-17 NOTE — Progress Notes (Signed)
Physical Therapy Weekly Progress Note  Patient Details  Name: Collin Diaz MRN: [id number] Date of Birth: 28-Jan-1956  Today's Date: 01/17/2012  Patient continues to make slow progress and has met 0 of 5 short term goals.  Patient continues to require +2 assist for bed mobility, slideboard transfers bed <> w/c, max-total A for w/c mobility sequencing with R hemi technique and use of lift equipment for static standing and postural control training but has been more alert during therapy sessions and showing improved initiation with functional mobility.    Patient continues to demonstrate the following deficits: L hemiplegia and increased tone, impaired initiation, arousal, attention, problem solving and sequencing, motor apraxia, decreased activity tolerance and endurance, impaired postural control, balance and therefore will continue to benefit from skilled PT intervention to enhance overall performance with activity tolerance, balance, postural control, ability to compensate for deficits, functional use of  left upper extremity and left lower extremity, attention and coordination.  Patient progressing toward long term goals.  Continue plan of care.  PT Short Term Goals Week 2:  PT Short Term Goal 1 (Week 2): Patient will initiate 25% of the time for bed mobility training with use of UE support and max A PT Short Term Goal 1 - Progress (Week 2): Progressing toward goal PT Short Term Goal 2 (Week 2): Patient will transfer bed <> w/c with max A and initiate 25% of the time  PT Short Term Goal 2 - Progress (Week 2): Progressing toward goal PT Short Term Goal 3 (Week 2): Patient will perform w/c mobility on unit x 50' with R hemi technique and 75% cues for initiation and sequencing  PT Short Term Goal 3 - Progress (Week 2): Progressing toward goal PT Short Term Goal 4 (Week 2): Patient will tolerate dynamic sitting balance and postural control training activities without back support x 15 minutes  with mod-max A PT Short Term Goal 4 - Progress (Week 2): Progressing toward goal PT Short Term Goal 5 (Week 2): Patient will tolerate static standing in appropriate lift equipment for NMR to LLE, trunk control and pre-gait activities x 5 minutes  PT Short Term Goal 5 - Progress (Week 2): Progressing toward goal Week 3:  PT Short Term Goal 1 (Week 3): Patient will initiate 25% of the time for bed mobility training with use of UE support and max A PT Short Term Goal 2 (Week 3): Patient will transfer bed <> w/c with max A and initiate 25% of the time PT Short Term Goal 3 (Week 3): Patient will perform w/c mobility on unit x 50' with R hemi technique and 75% cues for initiation and sequencing  PT Short Term Goal 4 (Week 3): Patient will tolerate dynamic sitting balance and postural control training activities without back support x 15 minutes with mod-max A PT Short Term Goal 5 (Week 3): Patient will tolerate static standing in appropriate lift equipment for NMR to LLE, trunk control and pre-gait activities x 5 minutes   Therapy Documentation Precautions:  Precautions Precautions: Fall Precaution Comments: Lt sided hemiparesis Restrictions Weight Bearing Restrictions: No  See FIM for current functional status  Therapy/Group: Individual Therapy  [name] 01/17/2012, 11:17 AM

## 2012-01-17 NOTE — Progress Notes (Signed)
Occupational Therapy Session Note  Patient Details  Name: Collin Diaz MRN: [id number] Date of Birth: 1955/05/11  Today's Date: 01/17/2012 Time: 0730-0827 Time Calculation (min): 57 min  Short Term Goals: Week 2:  OT Short Term Goal 1 (Week 2): Pt will maintain static sitting for 5 mins with close supervision in preparation for selfcare tasks OT Short Term Goal 2 (Week 2): Pt will perform LB bathing sit to stand with Total +2 (pt 40%). OT Short Term Goal 3 (Week 2): Pt will perfrom UB dressing in supportive sitting with min facilitation.  OT Short Term Goal 4 (Week 2): Pt will perform supine to sit with max assist in preparation for selfcare tasks OT Short Term Goal 5 (Week 2): Pt will perform toilet transfers squat pivot with max assist.  Skilled Therapeutic Interventions/Progress Updates:    Pt seen for ADL retraining with focus on bed mobility, trunk control, dynamic and static sitting balance, and increased participation in bathing and dressing.  Pt demonstrated increased carryover of technique for rolling in bed and was able to roll Rt and Lt with only +1 assist, however continues to require +2 assist sidelying to sit.  Pt continues to demonstrate Lt lateral lean in static sitting which increases with dynamic reaching.  Encouraged pt to weight shift with increased forward lean to reach pants around ankles and pull them up.  Focus on weight shifting prior to squat pivot to w/c, pt with increased initiation with reaching for w/c but continues to require +2 assist with squat pivot transfer.    Therapy Documentation Precautions:  Precautions Precautions: Fall Precaution Comments: Lt sided hemiparesis Restrictions Weight Bearing Restrictions: No General:   Vital Signs: Therapy Vitals Temp: 98.7 F (37.1 C) Temp src: Oral Pulse Rate: 75  Resp: 18  BP: 144/98 mmHg Patient Position, if appropriate: Lying Oxygen Therapy SpO2: 98 % O2 Device: None (Room air) Pain:  Pt with no  c/o pain this session.  See FIM for current functional status  Therapy/Group: Individual Therapy  Leonette Monarch 01/17/2012, 8:29 AM

## 2012-01-18 ENCOUNTER — Inpatient Hospital Stay (HOSPITAL_COMMUNITY): Payer: Medicaid Other | Admitting: Occupational Therapy

## 2012-01-18 ENCOUNTER — Inpatient Hospital Stay (HOSPITAL_COMMUNITY): Payer: Medicaid Other | Admitting: *Deleted

## 2012-01-18 ENCOUNTER — Inpatient Hospital Stay (HOSPITAL_COMMUNITY): Payer: Medicaid Other | Admitting: Speech Pathology

## 2012-01-18 ENCOUNTER — Inpatient Hospital Stay (HOSPITAL_COMMUNITY): Payer: Medicaid Other

## 2012-01-18 DIAGNOSIS — Z5189 Encounter for other specified aftercare: Secondary | ICD-10-CM

## 2012-01-18 DIAGNOSIS — I69991 Dysphagia following unspecified cerebrovascular disease: Secondary | ICD-10-CM

## 2012-01-18 DIAGNOSIS — G8110 Spastic hemiplegia affecting unspecified side: Secondary | ICD-10-CM

## 2012-01-18 DIAGNOSIS — I619 Nontraumatic intracerebral hemorrhage, unspecified: Secondary | ICD-10-CM

## 2012-01-18 MED ORDER — LISINOPRIL 5 MG PO TABS
5.0000 mg | ORAL_TABLET | Freq: Every day | ORAL | Status: DC
  Administered 2012-01-18: 5 mg via ORAL
  Filled 2012-01-18 (×2): qty 1

## 2012-01-18 MED ORDER — LISINOPRIL 5 MG PO TABS
5.0000 mg | ORAL_TABLET | Freq: Every day | ORAL | Status: DC
  Administered 2012-01-19 – 2012-02-06 (×19): 5 mg via ORAL
  Filled 2012-01-18 (×22): qty 1

## 2012-01-18 MED ORDER — LISINOPRIL 5 MG PO TABS: 5.0000 mg | ORAL_TABLET | Freq: Two times a day (BID) | ORAL | Status: DC

## 2012-01-18 NOTE — Progress Notes (Signed)
Speech Language Pathology Daily Session Note and Weekly Progress Note  Patient Details  Name: Collin Diaz MRN: [id number] Date of Birth: 1955-06-18  Today's Date: 01/18/2012 Time: 1130-1145 Time Calculation (min): 15 min  Short Term Goals: Week 2: SLP Short Term Goal 1 (Week 2): Pt will utilize respiratory support sufficient to sustain phonation for 5 seconds with mod cues.  SLP Short Term Goal 2 (Week 2): Pt will complete effortful swallow in combination with towel tuck in order to improve hyolaryngeal elevation with mod cues. SLP Short Term Goal 3 (Week 2): Pt will tolerate dysphagia 1 diet with pudding thick liquids with mod assist for precautions.  SLP Short Term Goal 4 (Week 2): Pt will produce 1 word utterances with max assist to increase intelligibility.  SLP Short Term Goal 5 (Week 2): Patient will demonstrate ability to participate in an objective swallow study with mod assist.   Skilled Therapeutic Interventions: Co-treatment with OT.  Per results of objective swallow study completed today, patient's diet was upgraded from pudding thick liquids to nectar thick liquids.  Patient consumed Dys. 1 textures and nectar thick liquids with mod-max assist visual and verbal cuing to initiate swallow in a timely manner.  Patient exhibited frequent oral holding for periods of approximately 30 seconds.  Patient required min assist visual cues to check for anterior loss of bolus; however, patient became distracted at times by the act of wiping his face and this appeared to exacerbate delay in oral transit.  Patient exhibited no overt s/s of aspiration during the session.     FIM:  Comprehension Comprehension Mode: Auditory Comprehension: 4-Understands basic 75 - 89% of the time/requires cueing 10 - 24% of the time Expression Expression Mode: Verbal Expression: 1-Expresses basis less than 25% of the time/requires cueing greater than 75% of the time. Social Interaction Social Interaction:  2-Interacts appropriately 25 - 49% of time - Needs frequent redirection. Problem Solving Problem Solving: 2-Solves basic 25 - 49% of the time - needs direction more than half the time to initiate, plan or complete simple activities Memory Memory: 2-Recognizes or recalls 25 - 49% of the time/requires cueing 51 - 75% of the time FIM - Eating Eating Activity: 5: Set-up assist for open containers;5: Supervision/cues  Pain Pain Assessment Pain Assessment: No/denies pain  Therapy/Group: Individual Therapy   PageJoni Reining 01/18/2012, 1:44 PM   Speech Language Pathology Weekly Progress Note  Patient Details  Name: Collin Diaz MRN: [id number] Date of Birth: 1956/04/16  Today's Date: 01/18/2012  Short Term Goals: Week 2: SLP Short Term Goal 1 (Week 2): Pt will utilize respiratory support sufficient to sustain phonation for 5 seconds with mod cues.  SLP Short Term Goal 1 - Progress (Week 2): Progressing toward goal SLP Short Term Goal 2 (Week 2): Pt will complete effortful swallow in combination with towel tuck in order to improve hyolaryngeal elevation with mod cues. SLP Short Term Goal 2 - Progress (Week 2): Not met SLP Short Term Goal 3 (Week 2): Pt will tolerate dysphagia 1 diet with pudding thick liquids with mod assist for precautions.  SLP Short Term Goal 3 - Progress (Week 2): Met SLP Short Term Goal 4 (Week 2): Pt will produce 1 word utterances with max assist to increase intelligibility.  SLP Short Term Goal 4 - Progress (Week 2): Met SLP Short Term Goal 5 (Week 2): Patient will demonstrate ability to participate in an objective swallow study with mod assist.  SLP Short Term Goal 5 - Progress (  Week 2): Met Week 3: SLP Short Term Goal 1 (Week 3): Pt will utilize respiratory support sufficient to sustain phonation for 5 seconds with mod cues.  SLP Short Term Goal 2 (Week 3): Pt will complete effortful swallow in combination with towel tuck in order to improve hyolaryngeal  elevation with mod cues. SLP Short Term Goal 3 (Week 3): Pt will tolerate dysphagia 1 diet with nectar thick liquids with mod assist for precautions.  SLP Short Term Goal 4 (Week 3): Patient will tolerate trials of Dys. 2 textures with max assist multimodal cuing for efficient mastication and timely anterior-posterior transit of bolus SLP Short Term Goal 5 (Week 3): Pt will produce 1 word utterances with mod assist to increase intelligibility. SLP Short Term Goal 6 (Week 3): Pt will demonstrate improved volitional cough as a compensatory strategy for reduced pharyngeal sensation and airway protection with max assist multimodal cuing.    Weekly Progress Updates: Patient met 3 out of 5 short term goals for this reporting period with gains in dysphagia management.  Patient participated in an objective swallow study due to increased arousal and participation in therapy.  As a result, SLP upgraded his diet from Dys. 1 textures, pudding thick liquids to Dys. 1 textures, nectar thick liquids.  Per the results of the MBSS, SLP also recommends strengthening of volitional cough to compensate for reduced pharyngeal sensation during silent aspiration, and as a means for airway protection.  Patient continues to demonstrate increased oral holding of bolus and requires max assist cues to initiate timely oral transit.  Patient also continues to exhibit decreased verbal output, and requires max assist to initiate verbalization.  Patient attempts to communicate largely through gestures although he is able to verbalize primarily at the word level.   Patient's arousal has increased this reporting period, although lethargy does still appear to impact his performance during his sessions. Patient continues to benefit from skilled SLP services to address above deficits and maximize functional independence prior to discharge.     SLP Frequency: 1-2 X/day, 30-60 minutes;5 out of 7 days;Total of 15 hours over 7 days Estimated  Length of Stay: TBD SLP Treatment/Interventions: Cognitive remediation/compensation;Cueing hierarchy;Environmental controls;Dysphagia/aspiration precaution training;Internal/external aids;Functional tasks;Multimodal communication approach;Speech/Language facilitation;Patient/family education;Therapeutic Activities;Other (comment) (group treatment)  Jackalyn Lombard, [place]  Graduate Clinician Speech Language Pathology  Page, Joni Reining 01/18/2012, 2:19 PM  The above skilled treatment note has been reviewed and SLP is in agreement. Fae Pippin, M.A., CCC-SLP [phone]

## 2012-01-18 NOTE — Progress Notes (Signed)
hPhysical Therapy Session Note  Patient Details  Name: Collin Diaz MRN: [id number] Date of Birth: [birth year]  Today's Date: 01/18/2012 Time: 1030-1130 Time Calculation (min): 60 min  Short Term Goals: Week 3:  PT Short Term Goal 1 (Week 3): Patient will initiate 25% of the time for bed mobility training with use of UE support and max A PT Short Term Goal 2 (Week 3): Patient will transfer bed <> w/c with max A and initiate 25% of the time PT Short Term Goal 3 (Week 3): Patient will perform w/c mobility on unit x 50' with R hemi technique and 75% cues for initiation and sequencing  PT Short Term Goal 4 (Week 3): Patient will tolerate dynamic sitting balance and postural control training activities without back support x 15 minutes with mod-max A PT Short Term Goal 5 (Week 3): Patient will tolerate static standing in appropriate lift equipment for NMR to LLE, trunk control and pre-gait activities x 5 minutes   Skilled Therapeutic Interventions/Progress Updates:    pt reports new onset low back pain at beginning of session, performed passive stretching and mobility and reported improvement by end of session and no other intervention needed  There-ex for ROM to BLE- difficulty getting hip flexion past 90 degrees, tight rotators and hamstrings B, significant loosening of adductors on L with stretch  Therapeutic activity- bed mobility with total A and delayed initiation supine to sit to R with max cues; +2 A slide board transfer OOB to L after working on static sitting balance in bed- pt mod A initially d/t leaning backwards but reached close S level with cues and visual feedback; stood in standing frame for NMR/weight bearing BLE and to improve active trunk extension, >15 minutes reaching with RUE and using it for cognitive task (erase only a certain shape off mirror from field of many- no cues for cognition, facilitation for trunk extension that improved with time)  W/c mobility training with  hem itechnique with max A and poor functional efficiency controlled environement; unable to increase R push stroke with cues, did attend to L perispace with delay  Therapy Documentation Precautions:  Precautions Precautions: Fall Precaution Comments: Lt sided hemiparesis Restrictions Weight Bearing Restrictions: No     See FIM for current functional status  Therapy/Group: Individual Therapy  Michaelene Song 01/18/2012, 11:37 AM

## 2012-01-18 NOTE — Progress Notes (Signed)
Patient ID: Collin Diaz, male   DOB: [birth year], 56 y.o.   MRN: [id number] HPI: Collin Diaz is a 56 y.o. 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 Has history of poor controlled BP and had not had meds filled recently. Admitted 12/30/2011 with weakness, slurring of speech with coughing/chocking and blood pressure 254/143. Patient placed on Cardene drip. Cranial CT and imaging revealed right thalamus and basal ganglia hemorrhage with intraventricular hemorrhage and cytotoxic cerebral edema with right to left midline shift. Evaluated by neurology services with tight blood pressure control with SBP<150. Started on 3% saline drip with goal of 155-160 Na to decrease cerebral edema. Noted dysphagia currently on a dysphagia 1 diet   Subjective/Complaints: Flat affect minimal verbal output No c/os requiring mod A to sit with therapyReview of Systems  Unable to perform ROS: medical condition  Constitutional: Positive for malaise/fatigue.  Neurological: Positive for focal weakness.     Objective: Vital Signs: Blood pressure 173/106, pulse 80, temperature 98.2 F (36.8 C), temperature source Oral, resp. rate 20, height 5' 8.9" (1.75 m), weight 98.3 kg (216 lb 11.4 oz), SpO2 100.00%. No results found. No results found for this or any previous visit (from the past 72 hour(s)).   HEENT: normal Cardio: RRR Resp: CTA B/L GI: BS positive Extremity:  No Edema Skin:   Intact Neuro: Flat, Cranial Nerve II-XII normal, Abnormal Sensory reduced lt touch on L, Abnormal Motor o/5 on L side, Abnormal FMC Tone  Hypotonia, Dysarthric and Aphasic Musc/Skel:  Swelling Left hand  Assessment/Plan: 1. Functional deficits secondary to R thalamic hemorrhage which require 3+ hours per day of interdisciplinary therapy in a comprehensive inpatient rehab setting. Physiatrist is  providing close team supervision and 24 hour management of active medical problems listed below. Physiatrist and rehab team continue to assess barriers to discharge/monitor patient progress toward functional and medical goals. FIM: FIM - Bathing Bathing Steps Patient Completed: Chest;Abdomen;Front perineal area;Right upper leg;Left upper leg Bathing: 3: Mod-Patient completes 5-7 69f 10 parts or 50-74%  FIM - Upper Body Dressing/Undressing Upper body dressing/undressing steps patient completed: Thread/unthread right sleeve of pullover shirt/dresss;Put head through opening of pull over shirt/dress Upper body dressing/undressing: 0: Wears gown/pajamas-no public clothing FIM - Lower Body Dressing/Undressing Lower body dressing/undressing: 1: Two helpers  FIM - Toileting Toileting: 1: Two helpers  FIM - Diplomatic Services operational officer Devices: Psychiatrist Transfers: 1-To toilet/BSC: Total A (helper does all/Pt. < 25%);1-From toilet/BSC: Total A (helper does all/Pt. < 25%);1-Two helpers  FIM - Architectural technologist Transfer: 1: Two helpers  FIM - Locomotion: Wheelchair Distance: 40 Locomotion: Wheelchair: 1: Total Assistance/staff pushes wheelchair (Pt<25%) FIM - Locomotion: Ambulation Locomotion: Ambulation: 0: Activity did not occur  Comprehension Comprehension Mode: Auditory Comprehension: 2-Understands basic 25 - 49% of the time/requires cueing 51 - 75% of the time  Expression Expression Mode: Verbal Expression: 2-Expresses basic 25 - 49% of the time/requires cueing 50 - 75% of the time. Uses single words/gestures.  Social Interaction Social Interaction: 2-Interacts appropriately 25 - 49% of time - Needs frequent redirection.  Problem Solving Problem Solving: 1-Solves basic less than 25% of the time - needs direction nearly all the time or does not effectively solve problems and may need a  restraint for safety  Memory Memory: 2-Recognizes or recalls 25 - 49% of the time/requires cueing 51 - 75% of the  time   Medical Problem List and Plan:  1. DVT Prophylaxis/Anticoagulation: Pharmaceutical: Lovenox  2. Pain Management: tylenol  3. Mood:  low dose celexa, abilify already initiated, but change to HS dosing. Apparently he had been using this as an outpt, but it's unclear how long, why , or if he had been compliant with usage.  4. Neuropsych: This patient is not capable of making decisions on his/her own behalf Reduced intiation  Methylphenidate-d/c if recurrent seizure 5. Chronic renal failure  6. HTN: HCTZ held, cardura,increase  lisinopril-monitor and adjust as indicated  7. Dysphagia: D1, pudding thick liquids -could not participate with MBS due to cognition -ivf At night monitor BMET-aggressive oral hygiene as he has poor oral-motor control ? PEG, no significant improvement with megace 8.  Post stoke seizure d/o  LOS (Days) 15 A FACE TO FACE EVALUATION WAS PERFORMED  [name],[name] 01/18/2012, 7:20 AM

## 2012-01-18 NOTE — Progress Notes (Signed)
Occupational Therapy Session Note  Patient Details  Name: Collin Diaz MRN: [id number] Date of Birth: [birth year]  Today's Date: 01/18/2012 Time: 0730-0828 Time Calculation (min): 58 min  Short Term Goals: Week 2:  OT Short Term Goal 1 (Week 2): Pt will maintain static sitting for 5 mins with close supervision in preparation for selfcare tasks OT Short Term Goal 2 (Week 2): Pt will perform LB bathing sit to stand with Total +2 (pt 40%). OT Short Term Goal 3 (Week 2): Pt will perfrom UB dressing in supportive sitting with min facilitation.  OT Short Term Goal 4 (Week 2): Pt will perform supine to sit with max assist in preparation for selfcare tasks OT Short Term Goal 5 (Week 2): Pt will perform toilet transfers squat pivot with max assist.  Skilled Therapeutic Interventions/Progress Updates:    Pt seen for ADL retraining with focus on bed mobility, trunk control, dynamic and static sitting balance, and increased participation in bathing and dressing.  Pt demonstrated carryover of rolling in bed with increased initiation to reach towards bed rail with RUE and only required about 50% to assist with rolling.  Pt continues to require +2 assist with sidelying to sit, however demonstrated increased initiation with pushing up from bed with RUE (pt doing approx 10%).  Bathing and dressing completed in sitting at EOB with focus on trunk control and visual and tactile cues to promote weight shifting to Rt to improve upright sitting balance.  Pt able to weight shift forward to grasp pants around ankles and pull up over knees.  +2 sit <> stand to pull up pants with pt demonstrating increased weight shifting.  Slide board transfer +2 total assist, with no active initiation from pt except reaching for w/c arm rest.  Therapy Documentation Precautions:  Precautions Precautions: Fall Precaution Comments: Lt sided hemiparesis Restrictions Weight Bearing Restrictions: No General:   Vital Signs: Therapy  Vitals Temp: 98.2 F (36.8 C) Temp src: Oral Pulse Rate: 80  Resp: 20  BP: 152/105 mmHg Patient Position, if appropriate: Sitting (patient unable to stand/nurse is aware) Oxygen Therapy SpO2: 100 % O2 Device: None (Room air) Pulse Oximetry Type: Intermittent Pain: Pain Assessment Pain Assessment: No/denies pain  See FIM for current functional status  Therapy/Group: Individual Therapy  Leonette Monarch 01/18/2012, 9:17 AM

## 2012-01-18 NOTE — Progress Notes (Signed)
Occupational Therapy Session Note  Patient Details  Name: Collin Diaz MRN: [id number] Date of Birth: June 11, 1955  Today's Date: 01/18/2012 Time: 4098-1191 Time Calculation (min): 20 min  Skilled Therapeutic Interventions/Progress Updates:    Self feeding group with focus on selective attention during self feeding tasks.  Pt needing max instructional cueing to maintain selective attention in mod distracting environment.  Needs max instructional cueing to initiate self feeding, currently using the RUE only.  Therapy Documentation Precautions:  Precautions Precautions: Fall Precaution Comments: Lt sided hemiparesis Restrictions Weight Bearing Restrictions: No  Pain: Pain Assessment Pain Assessment: No/denies pain ADL: See FIM for current functional status  Therapy/Group: Group Therapy  Sandip Power OTR/L 01/18/2012, 12:54 PM

## 2012-01-18 NOTE — Procedures (Signed)
Objective Swallowing Evaluation: Modified Barium Swallowing Study  Patient Details  Name: DUSTEN LACHAT MRN: [id number] Date of Birth: 10-20-1955  Today's Date: 01/18/2012 Time: 0930-1000 Time Calculation (min): 30 min  Past Medical History:  Past Medical History  Diagnosis Date  . Hypertension   . Stroke    Past Surgical History: No past surgical history on file. HPI:  56 y/o male admitted with weakness and slurring of speech. PMH significant for prior stroke with residual right-sided deficits. CT revealed right basal ganglia intraparenchymal hemorrhage. Patient admitted to [facility] 01/03/2012.  Patient currently consuming Dys. 1 textures, pudding thick liquids. Objective study warranted to determine if diet upgrade is appropriate due to patient's increased arousal, and to determine presence of aspiration and/or penetration.       Recommendation/Prognosis  Clinical Impression Dysphagia Diagnosis: Moderate oral phase dysphagia;Mild pharyngeal phase dysphagia Clinical impression: Patient demonstrated a functional improvement as compared to previous objective test. He exhibited moderate-severe oral dysphagia with decreased lingual manipulation leading to reduced anterior-posterior movement of bolus and delayed oral transit. Mild pharyngeal dysphagia is characterized by decreased pharyngeal sensation resulting in trace silent aspiration with thin liquids via cup. Patient was unable to produce cough despite max assist verbal and tactile cues.  Swallow was initiated at the level of the valleculae with solids and at the pyriform sinuses with liquids and no reside was observed post swallow. SLP recommends patient continue Dys. 1 textures and upgrade to nectar thick liquids via cup with continued need for full supervision to ensure strict use of safe swallow precautions, which will reduce risk of aspiration.   Swallow Evaluation Recommendations Recommended Consults: Other (Comment) (MBSS to upgrade to  thin) Diet Recommendations: Dysphagia 1 (Puree);Nectar-thick liquid Liquid Administration via: Cup;No straw Medication Administration: Crushed with puree Supervision: Full supervision/cueing for compensatory strategies Compensations: Slow rate;Small sips/bites;Check for pocketing;Clear throat intermittently Postural Changes and/or Swallow Maneuvers: Seated upright 90 degrees;Out of bed for meals;Upright 30-60 min after meal Oral Care Recommendations: Oral care BID Other Recommendations: Prohibited food (jello, ice cream, thin soups);Have oral suction available;Remove water pitcher;Order thickener from pharmacy Follow up Recommendations: Skilled Nursing facility;24 hour supervision/assistance Prognosis Prognosis for Safe Diet Advancement: Good Barriers to Reach Goals: Cognitive deficits Individuals Consulted Consulted and Agree with Results and Recommendations: Patient  SLP Assessment/Plan Dysphagia Diagnosis: Moderate oral phase dysphagia;Mild pharyngeal phase dysphagia Clinical impression: Patient demonstrated a functional improvement as compared to previous objective test. He exhibited moderate-severe oral dysphagia with decreased lingual manipulation leading to reduced anterior-posterior movement of bolus and delayed oral transit. Mild pharyngeal dysphagia is characterized by decreased pharyngeal sensation resulting in trace silent aspiration with thin liquids via cup. Patient was unable to produce cough despite max assist verbal and tactile cues.  Swallow was initiated at the level of the valleculae with solids and at the pyriform sinuses with liquids and no reside was observed post swallow. SLP recommends patient continue Dys. 1 textures and upgrade to nectar thick liquids via cup with continued need for full supervision to ensure strict use of safe swallow precautions, which will reduce risk of aspiration.    Short Term Goals: Week 3: SLP Short Term Goal 1 (Week 3): Pt will utilize  respiratory support sufficient to sustain phonation for 5 seconds with mod cues.  SLP Short Term Goal 2 (Week 3): Pt will complete effortful swallow in combination with towel tuck in order to improve hyolaryngeal elevation with mod cues. SLP Short Term Goal 3 (Week 3): Pt will tolerate dysphagia 1 diet with nectar  thick liquids with mod assist for precautions.  SLP Short Term Goal 4 (Week 3): Patient will tolerate trials of Dys. 2 textures with max assist multimodal cuing for efficient mastication and timely anterior-posterior transit of bolus SLP Short Term Goal 5 (Week 3): Pt will produce 1 word utterances with mod assist to increase intelligibility. SLP Short Term Goal 6 (Week 3): Pt will demonstrate improved volitional cough as a compensatory strategy for reduced pharyngeal sensation and airway protection.    General:  Date of Onset: 12/30/11 HPI: 56 y/o male admitted with weakness and slurring of speech. PMH significant for prior stroke with residual right-sided deficits. CT revealed right basal ganglia intraparenchymal hemorrhage. Patient admitted to [facility] 01/03/2012.  Patient currently consuming Dys. 1 textures, pudding thick liquids. Objective study warranted to determine if diet upgrade is appropriate due to patient's increased arousal, and to determine presence of aspiration and/or penetration.   Type of Study: Modified Barium Swallowing Study Reason for Referral: Objectively evaluate swallowing function Previous Swallow Assessment: FEES 9/16 Dysphagia Diagnosis: Mild oral phase dysphagia;Moderate pharyngeal phase dysphagia Pt. exhibited mild oral dysphagia with decreased manipulation leading to slow transit. Mild-moderate pharyngeal dysphagia is characterized by decreased pharyngeal sensation resulting in penetration to the vocal cords with honey thick (cup) without sensation. Swallow was initiated at the level of the valleculae and pyriform sinsus. Intermittent and nonsignificant residue in the  pyriform sinuses. SLP recommends Dys 1 diet only (pudding thick liquids). Bedside swallow evaluation 9/19 indicates no change in function.   Diet Prior to this Study: Dysphagia 1 (puree);Pudding-thick liquids Temperature Spikes Noted: No Respiratory Status: Room air History of Recent Intubation: No Behavior/Cognition: Cooperative;Lethargic Oral Cavity - Dentition: Adequate natural dentition Oral Motor / Sensory Function: Impaired - see Bedside swallow eval Self-Feeding Abilities: Needs assist Patient Positioning: Upright in chair Baseline Vocal Quality: Breathy;Hoarse;Low vocal intensity Volitional Cough: Weak Volitional Swallow: Able to elicit Anatomy: Other (Comment) (C4-C6 boney protrusion impacting pharyngeal space) Pharyngeal Secretions: Normal  Reason for Referral:  Objectively evaluate swallowing function   Oral Phase Oral Preparation/Oral Phase Oral Phase: Impaired Oral - Honey Oral - Honey Teaspoon: Not tested Oral - Honey Cup: Weak lingual manipulation;Lingual pumping;Reduced posterior propulsion;Holding of bolus;Delayed oral transit Oral - Nectar Oral - Nectar Cup: Reduced posterior propulsion;Holding of bolus;Weak lingual manipulation;Delayed oral transit Oral - Thin Oral - Thin Cup: Weak lingual manipulation;Reduced posterior propulsion;Holding of bolus;Delayed oral transit Oral - Solids Oral - Puree: Reduced posterior propulsion;Holding of bolus;Weak lingual manipulation;Delayed oral transit Oral - Mechanical Soft: Reduced posterior propulsion;Holding of bolus;Weak lingual manipulation;Delayed oral transit;Impaired mastication Oral Phase - Comment Oral Phase - Comment: patient lethargic during study, required cues Pharyngeal Phase  Pharyngeal Phase Pharyngeal Phase: Impaired Pharyngeal - Honey Pharyngeal - Honey Teaspoon: Not tested Pharyngeal - Honey Cup: Delayed swallow initiation;Premature spillage to pyriform sinuses Pharyngeal - Nectar Pharyngeal - Nectar  Cup: Delayed swallow initiation;Premature spillage to pyriform sinuses Pharyngeal - Thin Pharyngeal - Thin Cup: Delayed swallow initiation;Premature spillage to pyriform sinuses;Trace aspiration;Penetration/Aspiration before swallow Penetration/Aspiration details (thin cup): Material enters airway, passes BELOW cords without attempt by patient to eject out (silent aspiration) Pharyngeal - Solids Pharyngeal - Puree: Delayed swallow initiation;Premature spillage to valleculae Pharyngeal - Mechanical Soft: Delayed swallow initiation;Premature spillage to valleculae  Jackalyn Lombard, B.A.  Graduate Clinician Speech Language Pathology  Page, Joni Reining 01/18/2012, 11:31 AM  The above evaluation has been reviewed and SLP is in agreement. Fae Pippin, M.A., CCC-SLP [phone]

## 2012-01-19 ENCOUNTER — Inpatient Hospital Stay (HOSPITAL_COMMUNITY): Payer: Medicaid Other

## 2012-01-19 ENCOUNTER — Inpatient Hospital Stay (HOSPITAL_COMMUNITY): Payer: Medicaid Other | Admitting: Speech Pathology

## 2012-01-19 ENCOUNTER — Inpatient Hospital Stay (HOSPITAL_COMMUNITY): Payer: Medicaid Other | Admitting: Occupational Therapy

## 2012-01-19 ENCOUNTER — Inpatient Hospital Stay (HOSPITAL_COMMUNITY): Payer: Self-pay | Admitting: Physical Therapy

## 2012-01-19 ENCOUNTER — Inpatient Hospital Stay (HOSPITAL_COMMUNITY): Payer: Medicaid Other | Admitting: Physical Therapy

## 2012-01-19 LAB — BASIC METABOLIC PANEL
BUN: 24 mg/dL — ABNORMAL HIGH (ref 6–23)
CO2: 19 mEq/L (ref 19–32)
Glucose, Bld: 101 mg/dL — ABNORMAL HIGH (ref 70–99)
Sodium: 136 mEq/L (ref 135–145)

## 2012-01-19 LAB — CBC
HCT: 32.9 % — ABNORMAL LOW (ref 39.0–52.0)
Hemoglobin: 11.3 g/dL — ABNORMAL LOW (ref 13.0–17.0)
MCH: 29.5 pg (ref 26.0–34.0)
MCHC: 34.3 g/dL (ref 30.0–36.0)
RBC: 3.83 MIL/uL — ABNORMAL LOW (ref 4.22–5.81)

## 2012-01-19 MED ORDER — CLONIDINE HCL 0.1 MG PO TABS
0.1000 mg | ORAL_TABLET | Freq: Four times a day (QID) | ORAL | Status: DC | PRN
  Administered 2012-01-19 – 2012-02-06 (×2): 0.1 mg via ORAL
  Filled 2012-01-19 (×3): qty 1

## 2012-01-19 MED ORDER — DOXAZOSIN MESYLATE 4 MG PO TABS
4.0000 mg | ORAL_TABLET | Freq: Every day | ORAL | Status: DC
  Filled 2012-01-19: qty 1

## 2012-01-19 MED ORDER — TRAMADOL HCL 50 MG PO TABS
50.0000 mg | ORAL_TABLET | Freq: Four times a day (QID) | ORAL | Status: DC | PRN
  Administered 2012-01-19 – 2012-02-06 (×6): 50 mg via ORAL
  Filled 2012-01-19 (×6): qty 1

## 2012-01-19 MED ORDER — HYDROCHLOROTHIAZIDE 12.5 MG PO CAPS
12.5000 mg | ORAL_CAPSULE | Freq: Every day | ORAL | Status: DC
Start: 2012-01-19 — End: 2012-01-22
  Administered 2012-01-19 – 2012-01-21 (×3): 12.5 mg via ORAL
  Filled 2012-01-19 (×6): qty 1

## 2012-01-19 MED ORDER — DOXAZOSIN MESYLATE 2 MG PO TABS
2.0000 mg | ORAL_TABLET | Freq: Every day | ORAL | Status: DC
  Administered 2012-01-19 – 2012-01-21 (×3): 2 mg via ORAL
  Filled 2012-01-19 (×4): qty 1

## 2012-01-19 MED ORDER — ARIPIPRAZOLE 2 MG PO TABS
1.0000 mg | ORAL_TABLET | Freq: Every day | ORAL | Status: DC
  Administered 2012-01-19 – 2012-02-11 (×24): 1 mg via ORAL
  Filled 2012-01-19 (×30): qty 1

## 2012-01-19 NOTE — Progress Notes (Addendum)
Occupational Therapy Note  Patient Details  Name: Collin Diaz MRN: [id number] Date of Birth: June 04, 1955 Today's Date: 01/19/2012 Time: 1130-1145 (15 min)  Pt seen for group session, [employer], with SLP.  OT focus on initiation, sequencing, and selective attention during self-feeding tasks.  Pt required max instructional and tactile cues to initiate self-feeding and swallowing.  Pt easily distracted by others in group.  Pt with no c/o pain this session.  Leonette Monarch 01/19/2012, 4:02 PM

## 2012-01-19 NOTE — Progress Notes (Signed)
Physical Therapy Session Note  Patient Details  Name: Collin Diaz MRN: [id number] Date of Birth: Sep 02, 1955  Today's Date: 01/19/2012 Time: 0930-1000 Time Calculation (min): 30 min  Short Term Goals: Week 3:  PT Short Term Goal 1 (Week 3): Patient will initiate 25% of the time for bed mobility training with use of UE support and max A PT Short Term Goal 2 (Week 3): Patient will transfer bed <> w/c with max A and initiate 25% of the time PT Short Term Goal 3 (Week 3): Patient will perform w/c mobility on unit x 50' with R hemi technique and 75% cues for initiation and sequencing  PT Short Term Goal 4 (Week 3): Patient will tolerate dynamic sitting balance and postural control training activities without back support x 15 minutes with mod-max A PT Short Term Goal 5 (Week 3): Patient will tolerate static standing in appropriate lift equipment for NMR to LLE, trunk control and pre-gait activities x 5 minutes   Skilled Therapeutic Interventions/Progress Updates:    no c/o pain  Therapeutic activity- cotx with OT to address sitting balance and postural control while OT addressed B and D. Sitting balance varied from close S to max A with L or posterior lean- unable to attend to sitting balance when attending to task such as bathing; sit to partial stand for weight bearing to manage pants and clean bottom  BP elevated, RN aware  Pt with improved initiation of bed mobility  Therapy Documentation Precautions:  Precautions Precautions: Fall Precaution Comments: Lt sided hemiparesis Restrictions Weight Bearing Restrictions: No    Vital Signs: Therapy Vitals BP: 144/102 mmHg See FIM for current functional status  Therapy/Group: Co-Treatment  Michaelene Song 01/19/2012, 12:26 PM

## 2012-01-19 NOTE — Progress Notes (Signed)
Occupational Therapy Weekly Progress Note and Treatment Session  Patient Details  Name: Collin Diaz MRN: [id number] Date of Birth: 11/17/1955  Today's Date: 01/19/2012 Time: 0900-0930 (0900-1000 co-tx with Thayer Ohm, PT) Time Calculation (min): 30 min  Patient has met 0 of 5 short term goals.  Pt is continuing to make slow progress, he continues to require +2 assist for bed mobility, slide board transfers, squat pivot transfers, and sit <> stand for LB bathing and dressing.  Pt has been more alert during therapy sessions and continues to shoe increased initiation with mobility and self-care tasks.  Patient continues to demonstrate the following deficits: Lt hemiplegia, increased tone, impaired initiation, decreased arousal and attention to task, decreased problem solving, decreased trunk and postural control and therefore will continue to benefit from skilled OT intervention to enhance overall performance with BADL and Reduce care partner burden.  Patient progressing toward long term goals..  Continue plan of care.  OT Short Term Goals Week 2:  OT Short Term Goal 1 (Week 2): Pt will maintain static sitting for 5 mins with close supervision in preparation for selfcare tasks OT Short Term Goal 1 - Progress (Week 2): Progressing toward goal OT Short Term Goal 2 (Week 2): Pt will perform LB bathing sit to stand with Total +2 (pt 40%). OT Short Term Goal 2 - Progress (Week 2): Progressing toward goal OT Short Term Goal 3 (Week 2): Pt will perfrom UB dressing in supportive sitting with min facilitation.  OT Short Term Goal 3 - Progress (Week 2): Progressing toward goal OT Short Term Goal 4 (Week 2): Pt will perform supine to sit with max assist in preparation for selfcare tasks OT Short Term Goal 4 - Progress (Week 2): Progressing toward goal OT Short Term Goal 5 (Week 2): Pt will perform toilet transfers squat pivot with max assist. OT Short Term Goal 5 - Progress (Week 2): Progressing toward  goal Week 3:  OT Short Term Goal 1 (Week 3): Pt will maintain static sitting for 5 mins with close supervision in preparation for selfcare tasks OT Short Term Goal 2 (Week 3): Pt will perform LB bathing sit to stand with Total +2 (pt 40%).  OT Short Term Goal 3 (Week 3): Pt will perfrom UB dressing in supportive sitting with min facilitation OT Short Term Goal 4 (Week 3): Pt will perform supine to sit with max assist in preparation for selfcare tasks  OT Short Term Goal 5 (Week 3): Pt will perform toilet transfers squat pivot with max assist.  Skilled Therapeutic Interventions/Progress Updates:    Pt seen for ADL retraining with co-tx with PT to provide additional focus on sitting balance and trunk control.  Pt demonstrated improvements in supine to sit with only +1 assist.  Use of intermittent weight bearing through Rt elbow to increase sitting balance at EOB with initial carryover, but once pt switches focus to bathing or another task he loses sitting balance.  Pt required +2 assist for sit <> stand and modified standing balance (bent knees and forward trunk flexion) with pulling up pants.  Pt initiated and attempted to assist with pulling up pants with RUE.  Slide board transfer with +2 and verbal cues for initiation and weight shifting.  Therapy Documentation Precautions:  Precautions Precautions: Fall Precaution Comments: Lt sided hemiparesis Restrictions Weight Bearing Restrictions: No General:   Vital Signs: Therapy Vitals BP: 144/102 mmHg Pain:   Pt with no c/o pain this session ADL: ADL Grooming: Moderate assistance Where Assessed-Grooming:  Wheelchair Upper Body Bathing: Minimal assistance Where Assessed-Upper Body Bathing: Edge of bed Lower Body Bathing: Maximal assistance Where Assessed-Lower Body Bathing: Edge of bed Upper Body Dressing: Maximal assistance Where Assessed-Upper Body Dressing: Edge of bed Lower Body Dressing: Dependent Where Assessed-Lower Body Dressing:  Edge of bed Toileting: Dependent Where Assessed-Toileting: Bedside Commode Toilet Transfer: Dependent Toilet Transfer Method: Charity fundraiser: Bedside commode ADL Comments: Pt with increased alertness, initiation, and attention to task, able to initiate tasks with more consistency however has difficulty completing tasks secondary to ?attention/fatigue. Continued focus on trunk control as pt fluctuates from supervision-max in a given session.  See FIM for current functional status  Therapy/Group: Individual Therapy  Leonette Monarch 01/19/2012, 11:27 AM

## 2012-01-19 NOTE — Progress Notes (Signed)
Speech Language Pathology Daily Session Note  Patient Details  Name: Collin Diaz MRN: [id number] Date of Birth: 1955/11/30  Today's Date: 01/19/2012 Time: 4098-1191 Time Calculation (min): 30 min  Short Term Goals: Week 2: SLP Short Term Goal 1 (Week 2): Pt will utilize respiratory support sufficient to sustain phonation for 5 seconds with mod cues.  SLP Short Term Goal 1 - Progress (Week 2): Progressing toward goal SLP Short Term Goal 2 (Week 2): Pt will complete effortful swallow in combination with towel tuck in order to improve hyolaryngeal elevation with mod cues. SLP Short Term Goal 2 - Progress (Week 2): Not met SLP Short Term Goal 3 (Week 2): Pt will tolerate dysphagia 1 diet with pudding thick liquids with mod assist for precautions.  SLP Short Term Goal 3 - Progress (Week 2): Met SLP Short Term Goal 4 (Week 2): Pt will produce 1 word utterances with max assist to increase intelligibility.  SLP Short Term Goal 4 - Progress (Week 2): Met SLP Short Term Goal 5 (Week 2): Patient will demonstrate ability to participate in an objective swallow study with mod assist.  SLP Short Term Goal 5 - Progress (Week 2): Met  Skilled Therapeutic Interventions: Co-treatment session with OT; SLP facilitated session with increased wait time and mod assist verbal and tactile cues to initiate swallows while consuming Dys. 1 textures and nectar thick liquids with no overt s/s of aspiration.  Patient exhibited frequent oral holding for periods of approximately 30 seconds to 2 minutes. Patient performing self-feeding and presenting self with next bite of sip often times assisted in initiation of swallow.  Patient required mod assist verbal and visual cues to check for anterior loss of bolus; however, patient became distracted at times by the act of wiping his face and this appeared to exacerbate delay in oral transit.    FIM:  Comprehension Comprehension Mode: Auditory Comprehension: 3-Understands  basic 50 - 74% of the time/requires cueing 25 - 50%  of the time Expression Expression Mode: Verbal Expression: 1-Expresses basis less than 25% of the time/requires cueing greater than 75% of the time. Social Interaction Social Interaction: 2-Interacts appropriately 25 - 49% of time - Needs frequent redirection. Problem Solving Problem Solving: 2-Solves basic 25 - 49% of the time - needs direction more than half the time to initiate, plan or complete simple activities Memory Memory: 2-Recognizes or recalls 25 - 49% of the time/requires cueing 51 - 75% of the time FIM - Eating Eating Activity: 4: Helper checks for pocketed food  Pain Pain Assessment Pain Assessment: No/denies pain  Therapy/Group: Group Therapy  Charlane Ferretti., CCC-SLP [phone]  Collin Diaz 01/19/2012, 1:56 PM

## 2012-01-19 NOTE — Progress Notes (Signed)
Orthopedic Tech Progress Note Patient Details:  Collin Diaz 20-Jun-1955 [id number]  Patient ID: Collin Diaz, male   DOB: 1955-07-11, 56 y.o.   MRN: [id number] Brace order completed by advanced  Nikki Dom 01/19/2012, 1:11 PM

## 2012-01-19 NOTE — Progress Notes (Signed)
Social Work Patient ID: Cora Daniels, male   DOB: 1955-12-08, 56 y.o.   MRN: [id number] Attempted to contact wife via cell phone but disconnected.  Will have RN get number next time she calls to ask about pt. Pt is able to participate more in therapies and is more alert.  Continue to address po issues, discharged IV's, see if pt can adequately Hydrate himself.  Continue to try to reach wife.

## 2012-01-19 NOTE — Progress Notes (Signed)
Physical Therapy Session Note  Patient Details  Name: Collin Diaz MRN: [id number] Date of Birth: Dec 28, 1955  Today's Date: 01/19/2012 Time: 4098-1191 Time Calculation (min): 50 min  Short Term Goals: Week 2:  PT Short Term Goal 1 (Week 2): Patient will initiate 25% of the time for bed mobility training with use of UE support and max A PT Short Term Goal 1 - Progress (Week 2): Progressing toward goal PT Short Term Goal 2 (Week 2): Patient will transfer bed <> w/c with max A and initiate 25% of the time  PT Short Term Goal 2 - Progress (Week 2): Progressing toward goal PT Short Term Goal 3 (Week 2): Patient will perform w/c mobility on unit x 50' with R hemi technique and 75% cues for initiation and sequencing  PT Short Term Goal 3 - Progress (Week 2): Progressing toward goal PT Short Term Goal 4 (Week 2): Patient will tolerate dynamic sitting balance and postural control training activities without back support x 15 minutes with mod-max A PT Short Term Goal 4 - Progress (Week 2): Progressing toward goal PT Short Term Goal 5 (Week 2): Patient will tolerate static standing in appropriate lift equipment for NMR to LLE, trunk control and pre-gait activities x 5 minutes  PT Short Term Goal 5 - Progress (Week 2): Progressing toward goal Week 3:  PT Short Term Goal 1 (Week 3): Patient will initiate 25% of the time for bed mobility training with use of UE support and max A PT Short Term Goal 2 (Week 3): Patient will transfer bed <> w/c with max A and initiate 25% of the time PT Short Term Goal 3 (Week 3): Patient will perform w/c mobility on unit x 50' with R hemi technique and 75% cues for initiation and sequencing  PT Short Term Goal 4 (Week 3): Patient will tolerate dynamic sitting balance and postural control training activities without back support x 15 minutes with mod-max A PT Short Term Goal 5 (Week 3): Patient will tolerate static standing in appropriate lift equipment for NMR to LLE,  trunk control and pre-gait activities x 5 minutes   Therapy Documentation Precautions:  Precautions Precautions: Fall Precaution Comments: Lt sided hemiparesis Restrictions Weight Bearing Restrictions: No General:   Vital Signs: Therapy Vitals BP: 144/102 mmHg Pain: Pain Assessment Pain Assessment: Faces Faces Pain Scale: Hurts little more Pain Type: Acute pain Pain Location: Ankle Pain Orientation: Left Pain Descriptors: Aching Pain Frequency: Intermittent Pain Onset: Gradual Patients Stated Pain Goal: 2 Pain Intervention(s): Medication (See eMAR) PAINAD (Pain Assessment in Advanced Dementia) Breathing: occasional labored breathing, short period of hyperventilation Negative Vocalization: occasional moan/groan, low speech, negative/disapproving quality Facial Expression: facial grimacing Body Language: rigid, fists clenched, knees up, pushing/pulling away, strikes out Consolability: distracted or reassured by voice/touch PAINAD Score: 7  Locomotion : Wheelchair Mobility Distance: 20  Other Treatments: Treatments Therapeutic Activity: Patient performed w/c mobility for sequencing and attention to task and initiation with R hemi technique with mod-max A with improved initiation today.  Patient demonstraing increased flexor tone today in response to light touch L side; patient reporting pain in L calf and foot but unable to describe, localize or rate; patient also inconsistently reporting when pain started; L ankle air cast in place.  Attempted LUE stretching and WB in sitting but significantly limited by pain and flexor withdrawal.  RN and PA notified of interference of pain and tone with mobility; RN provided pain medication; PA states that they are hesitant to begin an  anti-spasmodic medication secondary to lethargy and poor kidney function.  Transported patient to lunch group; patient to go to xray for imaging of his foot after lunch.    See FIM for current functional  status  Therapy/Group: Individual Therapy  Collin Diaz 01/19/2012, 12:22 PM

## 2012-01-19 NOTE — Progress Notes (Signed)
Patient ID: Collin Diaz, male   DOB: [birth year], 56 y.o.   MRN: [id number] HPI: Collin Diaz is a 56 y.o. 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 Has history of poor controlled BP and had not had meds filled recently. Admitted 12/30/2011 with weakness, slurring of speech with coughing/chocking and blood pressure 254/143. Patient placed on Cardene drip. Cranial CT and imaging revealed right thalamus and basal ganglia hemorrhage with intraventricular hemorrhage and cytotoxic cerebral edema with right to left midline shift. Evaluated by neurology services with tight blood pressure control with SBP<150. Started on 3% saline drip with goal of 155-160 Na to decrease cerebral edema. Noted dysphagia currently on a dysphagia 1 diet   Subjective/Complaints: Want to get up in WC No c/os requiring mod A to sit with therapyReview of Systems  Unable to perform ROS: medical condition  Constitutional: Positive for malaise/fatigue.  Neurological: Positive for focal weakness.     Objective: Vital Signs: Blood pressure 144/90, pulse 80, temperature 98.6 F (37 C), temperature source Oral, resp. rate 19, height 5' 8.9" (1.75 m), weight 98.3 kg (216 lb 11.4 oz), SpO2 96.00%. Dg Swallowing Func-speech Pathology  01/18/2012  CLINICAL DATA: assess for silent aspiration   FLUOROSCOPY FOR SWALLOWING FUNCTION STUDY:  Fluoroscopy was provided for swallowing function study, which was  administered by a speech pathologist.  Final results and recommendations  from this study are contained within the speech pathology report.     Results for orders placed during the hospital encounter of 01/03/12 (from the past 72 hour(s))  BASIC METABOLIC PANEL     Status: Abnormal   Collection Time   01/19/12  6:10 AM      Component Value Range Comment   Sodium 136  135 - 145 mEq/L    Potassium 3.8   3.5 - 5.1 mEq/L    Chloride 106  96 - 112 mEq/L    CO2 19  19 - 32 mEq/L    Glucose, Bld 101 (*) 70 - 99 mg/dL    BUN 24 (*) 6 - 23 mg/dL    Creatinine, Ser 1.47 (*) 0.50 - 1.35 mg/dL    Calcium 9.7  8.4 - 82.9 mg/dL    GFR calc non Af Amer 35 (*) >90 mL/min    GFR calc Af Amer 41 (*) >90 mL/min   CBC     Status: Abnormal   Collection Time   01/19/12  6:10 AM      Component Value Range Comment   WBC 4.4  4.0 - 10.5 K/uL    RBC 3.83 (*) 4.22 - 5.81 MIL/uL    Hemoglobin 11.3 (*) 13.0 - 17.0 g/dL    HCT 56.2 (*) 13.0 - 52.0 %    MCV 85.9  78.0 - 100.0 fL    MCH 29.5  26.0 - 34.0 pg    MCHC 34.3  30.0 - 36.0 g/dL    RDW 86.5  78.4 - 69.6 %    Platelets 224  150 - 400 K/uL      HEENT: normal Cardio: RRR Resp: CTA B/L GI: BS positive Extremity:  No Edema Skin:   Intact Neuro: Flat, Cranial Nerve II-XII normal, Abnormal Sensory reduced lt touch on L, Abnormal Motor o/5 on L side, Abnormal FMC Tone  Hypotonia, Dysarthric and Aphasic Musc/Skel:  Swelling Left hand  Assessment/Plan: 1. Functional deficits secondary to R thalamic hemorrhage which require 3+  hours per day of interdisciplinary therapy in a comprehensive inpatient rehab setting. Physiatrist is providing close team supervision and 24 hour management of active medical problems listed below. Physiatrist and rehab team continue to assess barriers to discharge/monitor patient progress toward functional and medical goals. FIM: FIM - Bathing Bathing Steps Patient Completed: Chest;Left Arm;Abdomen;Front perineal area;Right upper leg;Left upper leg Bathing: 3: Mod-Patient completes 5-7 31f 10 parts or 50-74%  FIM - Upper Body Dressing/Undressing Upper body dressing/undressing steps patient completed: Thread/unthread right sleeve of pullover shirt/dresss;Put head through opening of pull over shirt/dress Upper body dressing/undressing: 0: Wears gown/pajamas-no public clothing FIM - Lower Body Dressing/Undressing Lower body  dressing/undressing: 1: Two helpers  FIM - Toileting Toileting: 1: Two helpers  FIM - Diplomatic Services operational officer Devices: Psychiatrist Transfers: 1-To toilet/BSC: Total A (helper does all/Pt. < 25%);1-From toilet/BSC: Total A (helper does all/Pt. < 25%);1-Two helpers  FIM - Architectural technologist Transfer: 1: Two helpers  FIM - Locomotion: Wheelchair Distance: 50 Locomotion: Wheelchair: 2: Travels 50 - 149 ft with maximal assistance (Pt: 25 - 49%) FIM - Locomotion: Ambulation Locomotion: Ambulation: 0: Activity did not occur  Comprehension Comprehension Mode: Auditory Comprehension: 4-Understands basic 75 - 89% of the time/requires cueing 10 - 24% of the time  Expression Expression Mode: Verbal Expression: 2-Expresses basic 25 - 49% of the time/requires cueing 50 - 75% of the time. Uses single words/gestures.  Social Interaction Social Interaction: 2-Interacts appropriately 25 - 49% of time - Needs frequent redirection.  Problem Solving Problem Solving: 1-Solves basic less than 25% of the time - needs direction nearly all the time or does not effectively solve problems and may need a restraint for safety  Memory Memory: 2-Recognizes or recalls 25 - 49% of the time/requires cueing 51 - 75% of the time   Medical Problem List and Plan:  1. DVT Prophylaxis/Anticoagulation: Pharmaceutical: Lovenox  2. Pain Management: tylenol  3. Mood:  low dose celexa, abilify already initiated, but change to HS dosing. Apparently he had been using this as an outpt, but it's unclear how long, why , or if he had been compliant with usage.  4. Neuropsych: This patient is not capable of making decisions on his/her own behalf Reduced intiation  Methylphenidate-d/c if recurrent seizure 5. Chronic renal failure appears to be at baseline with IV hydration at night only  6. HTN: blood pressures increasing will  restartHCTZ , cardura,increase  lisinopril-monitor and adjust as indicated latest Bmet improved 7. Dysphagia: D1, pudding thick liquids -could not participate with MBS due to cognition -ivf At night monitor BMET-aggressive oral hygiene as he has poor oral-motor control ? PEG, no significant improvement with megace,mainly food preference issue will discontinue Megace 8.  Post stoke seizure d/o  LOS (Days) 16 A FACE TO FACE EVALUATION WAS PERFORMED  [name],[name] 01/19/2012, 8:22 AM

## 2012-01-19 NOTE — Progress Notes (Signed)
01/19/12 Nursing Note: Pt  BP taken manually after recorded by NT. BP 144/90 p-80.A; Purvis Sheffield made aware and will be in to see pt.

## 2012-01-19 NOTE — Progress Notes (Addendum)
Nutrition Follow-up Follow-Up  Weight Status:  230 lb - wt 9/24 - Continued weight loss 224 lbs 10/1 216 lbs 10/2   Intervention:   1. Recommend nocturnal enteral nutrition regimen to help meet nutritional needs until PO intake improves, as PO intake is only meeting 25% of calorie and <10% of protein needs at this time. Recommend Osmolite 1.5 at 25 ml/hr x 12 hours, advance by 10 ml/hr every 4 hours to goal of 75 ml/hr x 12 hours. 30 ml Prostat via tube BID. This regimen will provide: 1550 kcal, 87 grams protein, 686 ml free water and will provide: 83% kcal needs and 100% protein needs. 2. Recommend consider PEG for long term nutrition support    Assessment:   It appears that due to patients brain injury he is unable to advance diet or consume adequate po's. PO intake has been inadequate x 20 days during hospitalization and rehab stay.  Diet Order:  Dysphagia 1 diet, Nectar Thickened Liquids - diet upgraded 10/3 Supplements: Ensure Pudding PO TID, Per pt's RN pt only takes a couple of bites when she give medications and will hold supplement in his mouth. Meal completion has been <50% Spoke with SLP, who reports that pt gets upset at [employer] because he does not want pureed food. SLP reports that pt continues to be unable to chew foods and is unable to upgrade his diet textures. Pt holding food in his mouth sometimes 2 minutes before swallowing.   Pt sleeping in room.  Skin: skin remains intact Labs: reviewed Pertinent meds: continues on megace (since 9/25); Nocturnal IVF's discontinued today Last BM: 10/3   Meds: Scheduled Meds:    . ARIPiprazole  1 mg Oral QHS  . citalopram  10 mg Oral Daily  . doxazosin  2 mg Oral QHS  . enoxaparin  40 mg Subcutaneous Daily  . feeding supplement  1 Container Oral TID BM  . hydrocerin   Topical BID  . hydrochlorothiazide  12.5 mg Oral Daily  . levETIRAcetam  500 mg Oral BID  . lisinopril  5 mg Oral Daily  . megestrol  400 mg Oral BID  .  methylphenidate  10 mg Oral BID WC  . DISCONTD: ARIPiprazole  1 mg Oral QHS  . DISCONTD: doxazosin  2 mg Oral QHS  . DISCONTD: doxazosin  4 mg Oral QHS   Continuous Infusions:    . DISCONTD: sodium chloride Stopped (01/19/12 0634)   PRN Meds:.acetaminophen, alum & mag hydroxide-simeth, bisacodyl, cloNIDine, diphenhydrAMINE, food thickener, guaiFENesin-dextromethorphan, ondansetron (ZOFRAN) IV, prochlorperazine, prochlorperazine, prochlorperazine, RESOURCE THICKENUP CLEAR, senna-docusate, traMADol, traZODone  Labs:  CMP     Component Value Date/Time   NA 136 01/19/2012 0610   K 3.8 01/19/2012 0610   CL 106 01/19/2012 0610   CO2 19 01/19/2012 0610   GLUCOSE 101* 01/19/2012 0610   BUN 24* 01/19/2012 0610   CREATININE 2.02* 01/19/2012 0610   CALCIUM 9.7 01/19/2012 0610   PROT 6.9 01/04/2012 0606   ALBUMIN 3.2* 01/04/2012 0606   AST 23 01/04/2012 0606   ALT 29 01/04/2012 0606   ALKPHOS 90 01/04/2012 0606   BILITOT 0.5 01/04/2012 0606   GFRNONAA 35* 01/19/2012 0610   GFRAA 41* 01/19/2012 0610   Sodium  Date/Time Value Range Status  01/19/2012  6:10 AM 136  135 - 145 mEq/L Final  01/09/2012  6:00 AM 143  135 - 145 mEq/L Final  01/08/2012  2:44 PM 146* 135 - 145 mEq/L Final    Potassium  Date/Time  Value Range Status  01/19/2012  6:10 AM 3.8  3.5 - 5.1 mEq/L Final  01/09/2012  6:00 AM 4.6  3.5 - 5.1 mEq/L Final  01/08/2012  2:44 PM 4.9  3.5 - 5.1 mEq/L Final    Phosphorus  Date/Time Value Range Status  12/31/2011  5:50 AM 2.8  2.3 - 4.6 mg/dL Final    Magnesium  Date/Time Value Range Status  12/31/2011  5:50 AM 1.9  1.5 - 2.5 mg/dL Final   CBG (last 3)  No results found for this basename: GLUCAP:3 in the last 72 hours   Intake/Output Summary (Last 24 hours) at 01/19/12 1455 Last data filed at 01/19/12 0804  Gross per 24 hour  Intake   1240 ml  Output    850 ml  Net    390 ml    Body mass index is 32.10 kg/(m^2). Obese Class I  Estimated needs:  1875 -2075 kcal, 80 -90 grams  protein  Nutrition Dx:  Inadequate oral intake r/t poor attention AEB poor meal completion; ongoing.  Goal:  Pt to meet >/= 90% of their estimated nutrition needs, not met.  Monitor:  weight trends, lab trends, I/O's, PO intake, supplement tolerance  Kendell Bane RD, LDN, CNSC [phone] Pager [phone] After Hours Pager

## 2012-01-19 NOTE — Progress Notes (Signed)
I have read and agree with the following treatment session.  Duwayne Matters Hall, PT, DPT 

## 2012-01-19 NOTE — Progress Notes (Signed)
Orthopedic Tech Progress Note Patient Details:  KYROS SALZWEDEL 05/08/1955 [id number] Ankle air cast splint applied to Left ankle. Orders also placed for prafo/resting hand with Advanced. Spoke with [employer]. Ortho Devices Type of Ortho Device: Ankle Air splint Ortho Device/Splint Location: Left Ortho Device/Splint Interventions: Ordered;Application   Asia R Thompson 01/19/2012, 10:19 AM

## 2012-01-19 NOTE — Progress Notes (Signed)
Speech Language Pathology Daily Session Note  Patient Details  Name: Collin Diaz MRN: [id number] Date of Birth: [birth year]  Today's Date: 01/19/2012 Time: 7829-5621 Time Calculation (min): 15 min  Short Term Goals: Week 2: SLP Short Term Goal 1 (Week 2): Pt will utilize respiratory support sufficient to sustain phonation for 5 seconds with mod cues.  SLP Short Term Goal 1 - Progress (Week 2): Progressing toward goal SLP Short Term Goal 2 (Week 2): Pt will complete effortful swallow in combination with towel tuck in order to improve hyolaryngeal elevation with mod cues. SLP Short Term Goal 2 - Progress (Week 2): Not met SLP Short Term Goal 3 (Week 2): Pt will tolerate dysphagia 1 diet with pudding thick liquids with mod assist for precautions.  SLP Short Term Goal 3 - Progress (Week 2): Met SLP Short Term Goal 4 (Week 2): Pt will produce 1 word utterances with max assist to increase intelligibility.  SLP Short Term Goal 4 - Progress (Week 2): Met SLP Short Term Goal 5 (Week 2): Patient will demonstrate ability to participate in an objective swallow study with mod assist.  SLP Short Term Goal 5 - Progress (Week 2): Met  Skilled Therapeutic Interventions: Treatment session focused on addressing swallow goals with focus on increasing timeliness of swallow initiation.  SLP facilitated session with max assist verbal, environmental and tactile cues to arouse.  Patient found with puree in mouth and SLP provided oral care with hand over hand assist for patient to complete task.  SLP then facilitated session with presentation of cup with nectar-thick liquids and patient was unable to sustain arousal to complete brining mouth to cup and SLP ended session and educated RN regarding self-feeding and when to stop p.o. due to level of arousal.   Patient missed 15 minutes of skilled SLP services.    FIM:  Comprehension Comprehension Mode: Auditory Comprehension: 3-Understands basic 50 - 74% of the  time/requires cueing 25 - 50%  of the time Expression Expression Mode: Verbal Expression: 1-Expresses basis less than 25% of the time/requires cueing greater than 75% of the time. Social Interaction Social Interaction: 2-Interacts appropriately 25 - 49% of time - Needs frequent redirection. Problem Solving Problem Solving: 2-Solves basic 25 - 49% of the time - needs direction more than half the time to initiate, plan or complete simple activities Memory Memory: 2-Recognizes or recalls 25 - 49% of the time/requires cueing 51 - 75% of the time FIM - Eating Eating Activity: 4: Helper checks for pocketed food  Pain Pain Assessment Pain Assessment: No/denies pain  Therapy/Group: Individual Therapy  Charlane Ferretti., CCC-SLP [phone]  Nemiah Kissner 01/19/2012, 2:04 PM

## 2012-01-20 ENCOUNTER — Inpatient Hospital Stay (HOSPITAL_COMMUNITY): Payer: Medicaid Other | Admitting: Physical Therapy

## 2012-01-20 ENCOUNTER — Inpatient Hospital Stay (HOSPITAL_COMMUNITY): Payer: Medicaid Other

## 2012-01-20 NOTE — Progress Notes (Signed)
Physical Therapy Session Note  Patient Details  Name: Collin Diaz MRN: [id number] Date of Birth: [birth year]  Today's Date: 01/20/2012 Time: 1330-1400 Time Calculation (min): 30 min  Short Term Goals: Week 1:  PT Short Term Goal 1 (Week 1): Patient will initiate 25% of the time for bed mobility training with use of UE support and max A PT Short Term Goal 1 - Progress (Week 1): Not met PT Short Term Goal 2 (Week 1): Patient will transfer bed <> w/c with max A and initiate 25% of the time PT Short Term Goal 2 - Progress (Week 1): Not met PT Short Term Goal 3 (Week 1): Patient will perform w/c mobility on unit x 50' with R hemi technique and 75% cues for initiation and sequencing PT Short Term Goal 3 - Progress (Week 1): Not met PT Short Term Goal 4 (Week 1): Patient will tolerate dynamic sitting balance and postural control training activities without back support x 15 minutes with mod-max A PT Short Term Goal 4 - Progress (Week 1): Not met PT Short Term Goal 5 (Week 1): Patient will tolerate static standing in appropriate lift equipment for NMR to LLE, trunk control and pre-gait activities x 5 minutes PT Short Term Goal 5 - Progress (Week 1): Not met  Therapy Documentation Precautions:  Precautions Precautions: Fall Precaution Comments: Lt sided hemiparesis Restrictions Weight Bearing Restrictions: No Pain: Pain Assessment Pain Assessment: No/denies pain  Wheelchair Management:(15') propelling manual w/c with R hand only x 50' with min-A (patient not able to utilize R foot to assist with negotiating turns, moving forward and preventing R drift. Therapeutic Activity:(15') standing frame x 12' with postural control exercises  See FIM for current functional status  Therapy/Group: Individual Therapy  Bodey Frizell J 01/20/2012, 1:34 PM

## 2012-01-20 NOTE — Progress Notes (Signed)
Patient ID: Collin Diaz, male   DOB: 01-Jan-1956, 56 y.o.   MRN: [id number] HPI: Collin Diaz is a 56 y.o. 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 Has history of poor controlled BP and had not had meds filled recently. Admitted 12/30/2011 with weakness, slurring of speech with coughing/chocking and blood pressure 254/143. Patient placed on Cardene drip. Cranial CT and imaging revealed right thalamus and basal ganglia hemorrhage with intraventricular hemorrhage and cytotoxic cerebral edema with right to left midline shift. Evaluated by neurology services with tight blood pressure control with SBP<150. Started on 3% saline drip with goal of 155-160 Na to decrease cerebral edema. Noted dysphagia currently on a dysphagia 1 diet   Subjective/Complaints: No specific complaints Answers yes no auestions Mostly nonverbal ROS   Objective: Vital Signs: Blood pressure 124/86, pulse 71, temperature 99.2 F (37.3 C), temperature source Oral, resp. rate 16, height 5' 8.9" (1.75 m), weight 216 lb 11.4 oz (98.3 kg), SpO2 98.00%.   well-developed well-nourished male in no acute distress. HEENT exam atraumatic, normocephalic, neck supple without jugular venous distention. Chest clear to auscultation cardiac exam S1-S2 are regular. Abdominal exam overweight with bowel sounds, soft and nontender. Extremities no edema. Neurologic exam is alert , does not speak   Assessment/Plan: 1. Functional deficits secondary to R thalamic hemorrhage which require 3+ hours per day of interdisciplinary therapy in a comprehensive inpatient rehab setting.  Medical Problem List and Plan:  1. DVT Prophylaxis/Anticoagulation: Pharmaceutical: Lovenox  2. Pain Management: tylenol  3. Mood:  low dose celexa, abilify already initiated, but change to HS dosing.   4. Neuropsych: This patient is not  capable of making decisions on his/her own behalf Reduced intiation  Methylphenidate-d/c if recurrent seizure 5. Chronic renal failure appears to be at baseline with IV hydration at night only  6. HTN: blood pressures increasing will restartHCTZ , cardura,increase  lisinopril-monitor  7. Dysphagia: D1, pudding thick liquids - 8.  Post stoke seizure d/o  LOS (Days) 17 A FACE TO FACE EVALUATION WAS PERFORMED  Sheronica Corey HENRY 01/20/2012, 9:51 AM

## 2012-01-20 NOTE — Progress Notes (Addendum)
Physical Therapy Note  Patient Details  Name: Collin Diaz MRN: [id number] Date of Birth: 1956-01-22 Today's Date: 01/20/2012 Time: 1130-1230 (60 min)  Pt seen for [employer] with SLP and OT. OT focus on initiation, sequencing, and selective attention during self-feeding tasks. Pt required min vc's for attention to task, mod-max vc's for throat clearing. No vc's needed for initiation or sequencing. Patient was able to make needs known such as when he was finished eating and asking for soda.   Functional transfer performed from bed to w/c with slideboard; 2 person assist; patient assisted <10%.  No report of pain.  Individual session.    Limmie Patricia, OTR/L 01/20/2012, 12:41 PM   Addendum: added time. LE

## 2012-01-21 ENCOUNTER — Inpatient Hospital Stay (HOSPITAL_COMMUNITY): Payer: Self-pay | Admitting: Occupational Therapy

## 2012-01-21 NOTE — Progress Notes (Signed)
Orthopedic Tech Progress Note Patient Details:  Collin Diaz [birth year] [id number]  Patient ID: Collin Diaz, male   DOB: 07/25/1955, 56 y.o.   MRN: [id number]   [name] 01/21/2012, 9:08 AM LEFT PRAFO AND LEFT RESTING HAND SPLINT COMPLETED BY [employer]

## 2012-01-21 NOTE — Progress Notes (Signed)
Occupational Therapy Session Note  Patient Details  Name: Collin Diaz MRN: [id number] Date of Birth: [birth year]  Today's Date: 01/21/2012 Time: 4098-1191 Time Calculation (min): 40 min  Skilled Therapeutic Interventions/Progress Updates:  Max to Total A x2 for bed mobility, EOB sitting balance and bed to recliner transfer (via sliding board Total A x2).   Before bed to recliner transfer, focus on treatment neuro reducation to L UE, trunk and balance challenges.  Patient with congestive sounds in throat/chest area; RN gave decongestive oral medication.   Wife present but sleeping during 90% of session.     Therapy Documentation Precautions:  Precautions Precautions: Fall Precaution Comments: Lt sided hemiparesis Restrictions Weight Bearing Restrictions: No  Pain:denied  See FIM for current functional status  Therapy/Group: Individual Therapy  Bud Face [facility] 01/21/2012, 4:42 PM

## 2012-01-21 NOTE — Progress Notes (Signed)
Patient ID: KASTIEL SIMONIAN, male   DOB: [birth year], 56 y.o.   MRN: [id number] HPI: MIKHI ATHEY is a 56 y.o. 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 Has history of poor controlled BP and had not had meds filled recently. Admitted 12/30/2011 with weakness, slurring of speech with coughing/chocking and blood pressure 254/143. Patient placed on Cardene drip. Cranial CT and imaging revealed right thalamus and basal ganglia hemorrhage with intraventricular hemorrhage and cytotoxic cerebral edema with right to left midline shift. Evaluated by neurology services with tight blood pressure control with SBP<150. Started on 3% saline drip with goal of 155-160 Na to decrease cerebral edema. Noted dysphagia currently on a dysphagia 1 diet   Subjective/Complaints: No specific complaints Answers yes no auestions Mostly nonverbal ROS   Objective:no change Vital Signs: Blood pressure 114/80, pulse 64, temperature 99.1 F (37.3 C), temperature source Oral, resp. rate 20, height 5' 8.9" (1.75 m), weight 216 lb 11.4 oz (98.3 kg), SpO2 96.00%.   well-developed well-nourished male in no acute distress. HEENT exam atraumatic, normocephalic, neck supple without jugular venous distention. Chest clear to auscultation cardiac exam S1-S2 are regular. Abdominal exam overweight with bowel sounds, soft and nontender. Extremities no edema. Neurologic exam is alert , does not speak   Assessment/Plan: 1. Functional deficits secondary to R thalamic hemorrhage which require 3+ hours per day of interdisciplinary therapy in a comprehensive inpatient rehab setting.  Medical Problem List and Plan:  1. DVT Prophylaxis/Anticoagulation: Pharmaceutical: Lovenox  2. Pain Management: tylenol  3. Mood:  low dose celexa, abilify already initiated, but change to HS dosing.   4. Neuropsych: This patient  is not capable of making decisions on his/her own behalf Reduced intiation  Methylphenidate-d/c if recurrent seizure 5. Chronic renal failure appears to be at baseline with IV hydration at night only  6. HTN: blood pressures increasing will restartHCTZ , cardura,increase  lisinopril-monitor  7. Dysphagia: D1, pudding thick liquids - 8.  Post stoke seizure d/o  LOS (Days) 18 A FACE TO FACE EVALUATION WAS PERFORMED  SWORDS,BRUCE HENRY 01/21/2012, 11:23 AM

## 2012-01-22 ENCOUNTER — Inpatient Hospital Stay (HOSPITAL_COMMUNITY): Payer: Medicaid Other | Admitting: Physical Therapy

## 2012-01-22 ENCOUNTER — Inpatient Hospital Stay (HOSPITAL_COMMUNITY): Payer: Medicaid Other | Admitting: Speech Pathology

## 2012-01-22 ENCOUNTER — Inpatient Hospital Stay (HOSPITAL_COMMUNITY): Payer: Self-pay | Admitting: Physical Therapy

## 2012-01-22 ENCOUNTER — Inpatient Hospital Stay (HOSPITAL_COMMUNITY): Payer: Medicaid Other | Admitting: Occupational Therapy

## 2012-01-22 LAB — BASIC METABOLIC PANEL
CO2: 22 mEq/L (ref 19–32)
Calcium: 10 mg/dL (ref 8.4–10.5)
Chloride: 105 mEq/L (ref 96–112)
Creatinine, Ser: 2.4 mg/dL — ABNORMAL HIGH (ref 0.50–1.35)
Glucose, Bld: 106 mg/dL — ABNORMAL HIGH (ref 70–99)

## 2012-01-22 MED ORDER — DOXAZOSIN MESYLATE 4 MG PO TABS
4.0000 mg | ORAL_TABLET | Freq: Every day | ORAL | Status: DC
  Administered 2012-01-22 – 2012-02-07 (×15): 4 mg via ORAL
  Filled 2012-01-22 (×18): qty 1

## 2012-01-22 NOTE — Progress Notes (Signed)
Speech Language Pathology Daily Session Note  Patient Details  Name: Collin Diaz MRN: [id number] Date of Birth: 1956/03/01  Today's Date: 01/22/2012 Time: 4098-1191 Time Calculation (min): 45 min  Short Term Goals: Week 3: SLP Short Term Goal 1 (Week 3): Pt will utilize respiratory support sufficient to sustain phonation for 5 seconds with mod cues.  SLP Short Term Goal 2 (Week 3): Pt will complete effortful swallow in combination with towel tuck in order to improve hyolaryngeal elevation with mod cues. SLP Short Term Goal 3 (Week 3): Pt will tolerate dysphagia 1 diet with nectar thick liquids with mod assist for precautions.  SLP Short Term Goal 4 (Week 3): Patient will tolerate trials of Dys. 2 textures with max assist multimodal cuing for efficient mastication and timely anterior-posterior transit of bolus SLP Short Term Goal 5 (Week 3): Pt will produce 1 word utterances with mod assist to increase intelligibility. SLP Short Term Goal 6 (Week 3): Pt will demonstrate improved volitional cough as a compensatory strategy for reduced pharyngeal sensation and airway protection.    Skilled Therapeutic Interventions: Treatment session focused on addressing swallow goals with focus on increasing timeliness of swallow initiation. Patient consumed Dys. 1 textures, nectar thick liquids with reflexive cough x1 after alternating 2 bites per 1sip.  Trials of honey thick liquids did not cause coughing; however, patient exhibited increased anterior loss of bolus.  SLP facilitated session with mod assist verbal and tactile cues to initiate swallow.  Patient exhibited oral holding for periods of approximately 30 seconds to 1 minute.  Patient also required mod assist verbal and tactile cues to put cup down after sips in a timely manner.  Patient continues to perform self-feeding which often assists in initiation of swallow, particularly with nectar thick liquids via cup sips.     FIM:   Comprehension Comprehension Mode: Auditory Comprehension: 3-Understands basic 50 - 74% of the time/requires cueing 25 - 50%  of the time Expression Expression Mode: Verbal Expression: 1-Expresses basis less than 25% of the time/requires cueing greater than 75% of the time. Social Interaction Social Interaction: 1-Interacts appropriately less than 25% of the time. May be withdrawn or combative. Problem Solving Problem Solving: 1-Solves basic less than 25% of the time - needs direction nearly all the time or does not effectively solve problems and may need a restraint for safety Memory Memory: 2-Recognizes or recalls 25 - 49% of the time/requires cueing 51 - 75% of the time FIM - Eating Eating Activity: 5: Set-up assist for open containers;5: Supervision/cues  Pain Pain Assessment Pain Assessment: No/denies pain  Therapy/Group: Individual Therapy  Jackalyn Lombard, Conrad [place]  Graduate Clinician Speech Language Pathology  Page, Joni Reining 01/22/2012, 1:40 PM  The above skilled treatment note has been reviewed and SLP is in agreement. Fae Pippin, M.A., CCC-SLP [phone]

## 2012-01-22 NOTE — Progress Notes (Signed)
Patient ID: Collin Diaz, male   DOB: 1956-01-11, 56 y.o.   MRN: [id number] HPI: Collin Diaz is a 56 y.o. 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 Has history of poor controlled BP and had not had meds filled recently. Admitted 12/30/2011 with weakness, slurring of speech with coughing/chocking and blood pressure 254/143. Patient placed on Cardene drip. Cranial CT and imaging revealed right thalamus and basal ganglia hemorrhage with intraventricular hemorrhage and cytotoxic cerebral edema with right to left midline shift. Evaluated by neurology services with tight blood pressure control with SBP<150. Started on 3% saline drip with goal of 155-160 Na to decrease cerebral edema. Noted dysphagia currently on a dysphagia 1 diet   Subjective/Complaints: Very tired this am yReview of Systems  Unable to perform ROS: medical condition  Constitutional: Positive for malaise/fatigue.  Neurological: Positive for focal weakness.     Objective: Vital Signs: Blood pressure 158/102, pulse 95, temperature 99.3 F (37.4 C), temperature source Axillary, resp. rate 18, height 5' 8.9" (1.75 m), weight 98.3 kg (216 lb 11.4 oz), SpO2 98.00%. No results found. Results for orders placed during the hospital encounter of 01/03/12 (from the past 72 hour(s))  BASIC METABOLIC PANEL     Status: Abnormal   Collection Time   01/22/12  6:05 AM      Component Value Range Comment   Sodium 137  135 - 145 mEq/L    Potassium 4.0  3.5 - 5.1 mEq/L    Chloride 105  96 - 112 mEq/L    CO2 22  19 - 32 mEq/L    Glucose, Bld 106 (*) 70 - 99 mg/dL    BUN 31 (*) 6 - 23 mg/dL    Creatinine, Ser 7.82 (*) 0.50 - 1.35 mg/dL    Calcium 95.6  8.4 - 10.5 mg/dL    GFR calc non Af Amer 29 (*) >90 mL/min    GFR calc Af Amer 33 (*) >90 mL/min      HEENT: normal Cardio: RRR Resp: CTA B/L GI: BS  positive Extremity:  No Edema Skin:   Intact Neuro: Flat, Cranial Nerve II-XII normal, Abnormal Sensory reduced lt touch on L, Abnormal Motor o/5 on L side, Abnormal FMC Tone  Hypotonia, Dysarthric and Aphasic Musc/Skel:  Swelling Left hand  Assessment/Plan: 1. Functional deficits secondary to R thalamic hemorrhage which require 3+ hours per day of interdisciplinary therapy in a comprehensive inpatient rehab setting. Physiatrist is providing close team supervision and 24 hour management of active medical problems listed below. Physiatrist and rehab team continue to assess barriers to discharge/monitor patient progress toward functional and medical goals. FIM: FIM - Bathing Bathing Steps Patient Completed: Chest Bathing: 1: Two helpers  FIM - Upper Body Dressing/Undressing Upper body dressing/undressing steps patient completed: Thread/unthread right sleeve of pullover shirt/dresss Upper body dressing/undressing: 1: Total-Patient completed less than 25% of tasks FIM - Lower Body Dressing/Undressing Lower body dressing/undressing steps patient completed: Pull pants up/down Lower body dressing/undressing: 1: Two helpers  FIM - Toileting Toileting: 0: Activity did not occur  FIM - Diplomatic Services operational officer Devices: Bedside commode Toilet Transfers: 1-To toilet/BSC: Total A (helper does all/Pt. < 25%);1-From toilet/BSC: Total A (helper does all/Pt. < 25%);1-Two helpers  FIM - Banker Devices: Psychiatrist Transfer: 1: Two helpers  FIM - Locomotion: Wheelchair Distance: 20 Locomotion: Wheelchair: 1: Travels less than 50 ft with  maximal assistance (Pt: 25 - 49%) FIM - Locomotion: Ambulation Locomotion: Ambulation: 0: Activity did not occur  Comprehension Comprehension Mode: Auditory Comprehension: 3-Understands basic 50 - 74% of the time/requires cueing 25 - 50%  of the time  Expression Expression Mode:  Verbal Expression: 1-Expresses basis less than 25% of the time/requires cueing greater than 75% of the time.  Social Interaction Social Interaction: 1-Interacts appropriately less than 25% of the time. May be withdrawn or combative.  Problem Solving Problem Solving: 1-Solves basic less than 25% of the time - needs direction nearly all the time or does not effectively solve problems and may need a restraint for safety  Memory Memory: 1-Recognizes or recalls less than 25% of the time/requires cueing greater than 75% of the time   Medical Problem List and Plan:  1. DVT Prophylaxis/Anticoagulation: Pharmaceutical: Lovenox  2. Pain Management: tylenol  3. Mood:  low dose celexa, abilify already initiated, but change to HS dosing. Apparently he had been using this as an outpt, but it's unclear how long, why , or if he had been compliant with usage.  4. Neuropsych: This patient is not capable of making decisions on his/her own behalf Reduced intiation  Methylphenidate-d/c if recurrent seizure 5. Chronic renal failure was at baseline with IV hydration at night only but BUN and Creat have crept up on HCTZ +/- ACE I  6. HTN:  Restarted HCTZ but BUN and Creat increased ,will increase cardura ,D/C HCTZ cont  lisinopril-monitor and adjust 7. Dysphagia: D1, pudding thick liquids -could not participate with MBS due to cognition -ivf At night monitor BMET-aggressive oral hygiene as he has poor oral-motor control ? PEG, no significant improvement with megace,mainly food preference issue will discontinue Megace 8.  Post stoke seizure d/o  LOS (Days) 19 A FACE TO FACE EVALUATION WAS PERFORMED  [name],[name] 01/22/2012, 6:53 AM

## 2012-01-22 NOTE — Progress Notes (Signed)
Physical Therapy Session Note  Patient Details  Name: Collin Diaz MRN: [id number] Date of Birth: [birth year]  Today's Date: 01/22/2012 Time: 1500-1600 Time Calculation (min): 60 min  Short Term Goals: Week 1:  PT Short Term Goal 1 (Week 1): Patient will initiate 25% of the time for bed mobility training with use of UE support and max A PT Short Term Goal 1 - Progress (Week 1): Not met PT Short Term Goal 2 (Week 1): Patient will transfer bed <> w/c with max A and initiate 25% of the time PT Short Term Goal 2 - Progress (Week 1): Not met PT Short Term Goal 3 (Week 1): Patient will perform w/c mobility on unit x 50' with R hemi technique and 75% cues for initiation and sequencing PT Short Term Goal 3 - Progress (Week 1): Not met PT Short Term Goal 4 (Week 1): Patient will tolerate dynamic sitting balance and postural control training activities without back support x 15 minutes with mod-max A PT Short Term Goal 4 - Progress (Week 1): Not met PT Short Term Goal 5 (Week 1): Patient will tolerate static standing in appropriate lift equipment for NMR to LLE, trunk control and pre-gait activities x 5 minutes PT Short Term Goal 5 - Progress (Week 1): Not met  Therapy Documentation Precautions:  Precautions Precautions: Fall Precaution Comments: Lt sided hemiparesis Restrictions Weight Bearing Restrictions: No  Pain: Pain Assessment Pain Assessment: No/denies pain  Treatment: Patient in w/c at nurses station at start of therapy. Patient brought to gym by PT. Performed w/c < > mat sliding board transfer with max assist of two helpers and max vcs for initiation and sequencing. Patient was able to get increased forward lean when transferring by reaching RUE further down mat. Patient assisted more to day with transfers by using R side (i.e. Pushing with RUE when returning to chair).  Patient performed sitting balance/postural control at edge of mat with back against physioball. Patient  required manual facilitation at shoulders to achieve trunk extension. Patient performed forward leaning, keeping chest/head up, with UEs supported on rolling table. Patient had increased difficulty activating trunk extensors to return from forward position and required mod assist and vcs to achieve task. Trunk rotation was introduced as well, in form of having patient rotate and push table out to side and return to sitting. Cervical AROM was encouraged, including extension, rotation and lateral flexion. Patient presented with increased difficulty to perform cervical extension and lateral flexion bilaterally (L>R).   Patient returned to room early in hopes of performing w/c > bed transfer, however patient needed to go to bathroom. Bedside commode was set up and patient transferred from w/c > bedside commode with total assist requiring three helpers. Patient performed lateral leans to remove pants. Patient was left at end of session on bedside commode with nursing staff.   See FIM for current functional status  Therapy/Group: Individual Therapy  Zakyah Yanes [place] DPT Student  01/22/2012, 4:44 PM

## 2012-01-22 NOTE — Progress Notes (Signed)
Occupational Therapy Note  Patient Details  Name: Collin Diaz MRN: [id number] Date of Birth: 23-Jan-1956 Today's Date: 01/22/2012 Time 1130-1145 (15 min)  Pt seen for group therapy, [employer], with SLP and OT. OT focus on initiation, sequencing, and selective attention during self-feeding tasks. Pt required min vc's for attention to task, mod-max vc's for throat clearing and swallowing. No vc's needed for initiation or sequencing, with pt initiating multiple sips with drink.  Pt able to make needs known, by  requesting water to drink.  Pt with no c/o pain this session.   Leonette Monarch 01/22/2012, 4:06 PM

## 2012-01-22 NOTE — Progress Notes (Signed)
Occupational Therapy Session Note  Patient Details  Name: Collin Diaz MRN: [id number] Date of Birth: Sep 23, 1955  Today's Date: 01/22/2012 Time: 0915-1010 Time Calculation (min): 55 min  Short Term Goals: Week 3:  OT Short Term Goal 1 (Week 3): Pt will maintain static sitting for 5 mins with close supervision in preparation for selfcare tasks OT Short Term Goal 2 (Week 3): Pt will perform LB bathing sit to stand with Total +2 (pt 40%).  OT Short Term Goal 3 (Week 3): Pt will perfrom UB dressing in supportive sitting with min facilitation OT Short Term Goal 4 (Week 3): Pt will perform supine to sit with max assist in preparation for selfcare tasks  OT Short Term Goal 5 (Week 3): Pt will perform toilet transfers squat pivot with max assist.  Skilled Therapeutic Interventions/Progress Updates:    Pt seen for ADL retraining with bathing at EOB to focus on static and dynamic sitting balance.  Use of intermittent weight bearing through Rt elbow to increase sitting balance at EOB with initial carryover, but once pt switches focus to bathing or another task he loses sitting balance. Pt unable to complete sit to stand even with +2 assist for LB dressing, returned to supine in bed with side rolling to complete clothing management.  Pt with increased initiation and sequencing with bathing, however continues to require cues to initiate and struggles with follow through of tasks.  Pt with decreased arousal this session and required verbal and tactile cues to remain alert.   Therapy Documentation Precautions:  Precautions Precautions: Fall Precaution Comments: Lt sided hemiparesis Restrictions Weight Bearing Restrictions: No Pain:  Pt with no c/o pain this session.  See FIM for current functional status  Therapy/Group: Individual Therapy  Leonette Monarch 01/22/2012, 10:34 AM

## 2012-01-22 NOTE — Progress Notes (Signed)
Speech Language Pathology Daily Session Note  Patient Details  Name: Collin Diaz MRN: [id number] Date of Birth: Dec 31, 1955  Today's Date: 01/22/2012 Time: 7829-5621 Time Calculation (min): 30 min  Short Term Goals: Week 3: SLP Short Term Goal 1 (Week 3): Pt will utilize respiratory support sufficient to sustain phonation for 5 seconds with mod cues.  SLP Short Term Goal 2 (Week 3): Pt will complete effortful swallow in combination with towel tuck in order to improve hyolaryngeal elevation with mod cues. SLP Short Term Goal 3 (Week 3): Pt will tolerate dysphagia 1 diet with nectar thick liquids with mod assist for precautions.  SLP Short Term Goal 4 (Week 3): Patient will tolerate trials of Dys. 2 textures with max assist multimodal cuing for efficient mastication and timely anterior-posterior transit of bolus SLP Short Term Goal 5 (Week 3): Pt will produce 1 word utterances with mod assist to increase intelligibility. SLP Short Term Goal 6 (Week 3): Pt will demonstrate improved volitional cough as a compensatory strategy for reduced pharyngeal sensation and airway protection.    Skilled Therapeutic Interventions: Group treatment; SLP facilitated session with increased wait time and mod assist verbal and tactile cues to initiate swallows while consuming Dys. 1 textures and nectar thick liquids with reflexive cough x1 which SLP suspects resulted from sensed penetration of liquids.  With mod assist visual, verbal and tactile cues, patient exhibited weak volitional cough.  Patient's use of intermittent throat clear is improving.  Patient exhibited frequent oral holding for periods of approximately 1 minute. Patient required mod assist visual cues to check for anterior loss of bolus; however, patient became distracted at times by the act of wiping his face as well as by other environmental factors and this appeared to exacerbate delay in oral transit.  When self-feeding, patient exhibits multiple  bites per swallow and consecutive sips.  At times, these actions (e.g. presenting self with cup or utensil for bites or sips) appear to increase timeliness of swallow; however, at times increased bolus size leads to increased oral transit time.  SLP will continue to monitor performance during self-feeding tasks through the use of skilled observations to dynamically assess what swallow strategies and cuing are most appropriate for patient.   FIM:  Comprehension Comprehension Mode: Auditory Comprehension: 3-Understands basic 50 - 74% of the time/requires cueing 25 - 50%  of the time Expression Expression Mode: Verbal Expression: 1-Expresses basis less than 25% of the time/requires cueing greater than 75% of the time. Social Interaction Social Interaction: 1-Interacts appropriately less than 25% of the time. May be withdrawn or combative. Problem Solving Problem Solving: 1-Solves basic less than 25% of the time - needs direction nearly all the time or does not effectively solve problems and may need a restraint for safety Memory Memory: 2-Recognizes or recalls 25 - 49% of the time/requires cueing 51 - 75% of the time FIM - Eating Eating Activity: 5: Set-up assist for open containers;5: Supervision/cues  Pain Pain Assessment Pain Assessment: No/denies pain  Therapy/Group: Group Therapy  Jackalyn Lombard, [facility] [place]  Graduate Clinician Speech Language Pathology  Page, Joni Reining 01/22/2012, 2:03 PM  The above skilled treatment note has been reviewed and SLP is in agreement. Fae Pippin, M.A., CCC-SLP [phone]

## 2012-01-22 NOTE — Progress Notes (Signed)
I have read and agree with the following treatment session.  Audra Hall, PT, DPT 

## 2012-01-22 NOTE — Plan of Care (Signed)
Problem: RH SKIN INTEGRITY Goal: RH STG SKIN FREE OF INFECTION/BREAKDOWN Min assist  Outcome: Not Progressing Patient itching dry patches of skin to face.

## 2012-01-23 ENCOUNTER — Inpatient Hospital Stay (HOSPITAL_COMMUNITY): Payer: Medicaid Other | Admitting: Occupational Therapy

## 2012-01-23 ENCOUNTER — Inpatient Hospital Stay (HOSPITAL_COMMUNITY): Payer: Medicaid Other

## 2012-01-23 ENCOUNTER — Inpatient Hospital Stay (HOSPITAL_COMMUNITY): Payer: Medicaid Other | Admitting: Physical Therapy

## 2012-01-23 ENCOUNTER — Encounter (HOSPITAL_COMMUNITY): Payer: Self-pay | Admitting: Occupational Therapy

## 2012-01-23 ENCOUNTER — Inpatient Hospital Stay (HOSPITAL_COMMUNITY): Payer: Medicaid Other | Admitting: Speech Pathology

## 2012-01-23 NOTE — Progress Notes (Signed)
Social Work Patient ID: Collin Diaz, male   DOB: 03-04-1956, 56 y.o.   MRN: [id number] Working on [employer] and [employer].  Have spoken with wife regarding pt's care and plans, but she never has an answer.  Will start pursuing NHP, awaiting Medical stability.  Question is does pt eat enough to adequately meet his needs on his current diet.  Spoke with Speech and PA, trial of Dys 2 diet today in [employer].  Wife when she is here does not participate in therapies at all with pt but stays in the room talking on the phone or sleeping.

## 2012-01-23 NOTE — Progress Notes (Signed)
Speech Language Pathology Daily Session Note  Patient Details  Name: Collin Diaz MRN: [id number] Date of Birth: 1956-01-04  Today's Date: 01/23/2012 Time: 7829-5621 Time Calculation (min): 45 min  Short Term Goals: Week 3: SLP Short Term Goal 1 (Week 3): Pt will utilize respiratory support sufficient to sustain phonation for 5 seconds with mod cues.  SLP Short Term Goal 2 (Week 3): Pt will complete effortful swallow in combination with towel tuck in order to improve hyolaryngeal elevation with mod cues. SLP Short Term Goal 3 (Week 3): Pt will tolerate dysphagia 1 diet with nectar thick liquids with mod assist for precautions.  SLP Short Term Goal 4 (Week 3): Patient will tolerate trials of Dys. 2 textures with max assist multimodal cuing for efficient mastication and timely anterior-posterior transit of bolus SLP Short Term Goal 5 (Week 3): Pt will produce 1 word utterances with mod assist to increase intelligibility. SLP Short Term Goal 6 (Week 3): Pt will demonstrate improved volitional cough as a compensatory strategy for reduced pharyngeal sensation and airway protection.    Skilled Therapeutic Interventions: Treatment session addressed dysphagia goals.  SLP facilitated session with increased wait time and mod assist verbal and tactile cues to initiate timely swallows while consuming nectar thick liquids via cup with cough x1.  SLP suspects cough indicated sensed penetration due to increased bolus size and multiple swallows per sip.   Patient required mod faded to min assist verbal cues to take small sips and go slow after coughing episode.  Cues were effective at preventing any further s/s of aspiration.  Patient exhibited less frequent oral holding for periods of approximately 30 seconds with nectar thick liquids. Patient required min-mod assist verbal and visual cues to check for oral residue and anterior loss of bolus. Overall, patient demonstrated improved timeliness of swallow and, as  a result, SLP recommends trials of Dys. 2 textures during group therapy at lunch.     FIM:  Comprehension Comprehension Mode: Auditory Comprehension: 3-Understands basic 50 - 74% of the time/requires cueing 25 - 50%  of the time Expression Expression Mode: Verbal Expression: 1-Expresses basis less than 25% of the time/requires cueing greater than 75% of the time. Social Interaction Social Interaction: 1-Interacts appropriately less than 25% of the time. May be withdrawn or combative. Problem Solving Problem Solving: 1-Solves basic less than 25% of the time - needs direction nearly all the time or does not effectively solve problems and may need a restraint for safety Memory Memory: 2-Recognizes or recalls 25 - 49% of the time/requires cueing 51 - 75% of the time FIM - Eating Eating Activity: 5: Supervision/cues;5: Set-up assist for open containers  Pain Pain Assessment Pain Assessment: No/denies pain  Therapy/Group: Individual Therapy  Jackalyn Lombard, Conrad Belmont  Graduate Clinician Speech Language Pathology  Page, Joni Reining 01/23/2012, 2:13 PM  The above skilled treatment note has been reviewed and SLP is in agreement. Fae Pippin, M.A., CCC-SLP [phone]

## 2012-01-23 NOTE — Progress Notes (Signed)
Occupational Therapy Session Note  Patient Details  Name: BENTZION DAURIA MRN: [id number] Date of Birth: 1955/05/29  Today's Date: 01/23/2012 Time: 0915-1010 Time Calculation (min): 55 min  Short Term Goals: Week 2:  OT Short Term Goal 1 (Week 2): Pt will maintain static sitting for 5 mins with close supervision in preparation for selfcare tasks OT Short Term Goal 1 - Progress (Week 2): Progressing toward goal OT Short Term Goal 2 (Week 2): Pt will perform LB bathing sit to stand with Total +2 (pt 40%). OT Short Term Goal 2 - Progress (Week 2): Progressing toward goal OT Short Term Goal 3 (Week 2): Pt will perfrom UB dressing in supportive sitting with min facilitation.  OT Short Term Goal 3 - Progress (Week 2): Progressing toward goal OT Short Term Goal 4 (Week 2): Pt will perform supine to sit with max assist in preparation for selfcare tasks OT Short Term Goal 4 - Progress (Week 2): Progressing toward goal OT Short Term Goal 5 (Week 2): Pt will perform toilet transfers squat pivot with max assist. OT Short Term Goal 5 - Progress (Week 2): Progressing toward goal  Skilled Therapeutic Interventions/Progress Updates:    Pt seen for ADL retraining with bathing at EOB and sink from w/c to focus on static and dynamic sitting balance. Pt incontinent of bowel post LB bathing, requiring return to supine in bed with focus on rolling to clean up and don new brief.  Pt unable to complete sit to stand even with +2 assist for LB dressing, however with max verbal and tactile cues for weight shifting pt able to lift bottom with max physical assist while 2nd person pulled up pants.  Pt with increased initiation and sequencing with bathing, however continues to require cues to initiate and struggles with follow through of tasks. Engaged in oral hygiene with cues to spit and weight shifting forward to spit in sink.  Pt requires max physical assist to weight shift forward.   Therapy  Documentation Precautions:  Precautions Precautions: Fall Precaution Comments: Lt sided hemiparesis Restrictions Weight Bearing Restrictions: No Pain:  Pt with no c/o pain this session.  See FIM for current functional status  Therapy/Group: Individual Therapy  Leonette Monarch 01/23/2012, 12:30 PM

## 2012-01-23 NOTE — Progress Notes (Signed)
Patient ID: Collin Diaz, male   DOB: 1956/04/03, 56 y.o.   MRN: [id number] HPI: Collin Diaz is a 56 y.o. 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 Has history of poor controlled BP and had not had meds filled recently. Admitted 12/30/2011 with weakness, slurring of speech with coughing/chocking and blood pressure 254/143. Patient placed on Cardene drip. Cranial CT and imaging revealed right thalamus and basal ganglia hemorrhage with intraventricular hemorrhage and cytotoxic cerebral edema with right to left midline shift. Evaluated by neurology services with tight blood pressure control with SBP<150. Started on 3% saline drip with goal of 155-160 Na to decrease cerebral edema. Noted dysphagia currently on a dysphagia 1 diet   Subjective/Complaints: Very tired this am Review of Systems  Unable to perform ROS: medical condition  Constitutional: Positive for malaise/fatigue.  Neurological: Positive for focal weakness.     Objective: Vital Signs: Blood pressure 118/86, pulse 80, temperature 98.5 F (36.9 C), temperature source Oral, resp. rate 19, height 5' 8.9" (1.75 m), weight 98.3 kg (216 lb 11.4 oz), SpO2 93.00%. No results found. Results for orders placed during the hospital encounter of 01/03/12 (from the past 72 hour(s))  BASIC METABOLIC PANEL     Status: Abnormal   Collection Time   01/22/12  6:05 AM      Component Value Range Comment   Sodium 137  135 - 145 mEq/L    Potassium 4.0  3.5 - 5.1 mEq/L    Chloride 105  96 - 112 mEq/L    CO2 22  19 - 32 mEq/L    Glucose, Bld 106 (*) 70 - 99 mg/dL    BUN 31 (*) 6 - 23 mg/dL    Creatinine, Ser 2.95 (*) 0.50 - 1.35 mg/dL    Calcium 62.1  8.4 - 10.5 mg/dL    GFR calc non Af Amer 29 (*) >90 mL/min    GFR calc Af Amer 33 (*) >90 mL/min      HEENT: normal Cardio: RRR Resp: CTA B/L GI: BS  positive Extremity:  No Edema Skin:   Intact Neuro: Flat, Cranial Nerve II-XII normal, Abnormal Sensory reduced lt touch on L, Abnormal Motor o/5 on L side, Abnormal FMC Tone  Hypotonia, Dysarthric and Aphasic Musc/Skel:  Swelling Left hand  Assessment/Plan: 1. Functional deficits secondary to R thalamic hemorrhage which require 3+ hours per day of interdisciplinary therapy in a comprehensive inpatient rehab setting. Physiatrist is providing close team supervision and 24 hour management of active medical problems listed below. Physiatrist and rehab team continue to assess barriers to discharge/monitor patient progress toward functional and medical goals. FIM: FIM - Bathing Bathing Steps Patient Completed: Chest;Left Arm;Abdomen;Front perineal area;Right upper leg;Left upper leg Bathing: 3: Mod-Patient completes 5-7 16f 10 parts or 50-74%  FIM - Upper Body Dressing/Undressing Upper body dressing/undressing steps patient completed: Thread/unthread right sleeve of pullover shirt/dresss Upper body dressing/undressing: 1: Total-Patient completed less than 25% of tasks FIM - Lower Body Dressing/Undressing Lower body dressing/undressing steps patient completed: Pull pants up/down Lower body dressing/undressing: 1: Two helpers  FIM - Toileting Toileting: 0: Activity did not occur  FIM - Diplomatic Services operational officer Devices: Psychiatrist Transfers:  (Three helpers)  FIM - Banker Devices: Bed rails Bed/Chair Transfer: 1: Two helpers  FIM - Locomotion: Wheelchair Distance: 20 Locomotion: Wheelchair: 0: Activity did not occur FIM - Locomotion: Ambulation  Locomotion: Ambulation: 0: Activity did not occur  Comprehension Comprehension Mode: Auditory Comprehension: 3-Understands basic 50 - 74% of the time/requires cueing 25 - 50%  of the time  Expression Expression Mode: Verbal Expression: 1-Expresses basis less than 25% of  the time/requires cueing greater than 75% of the time.  Social Interaction Social Interaction: 1-Interacts appropriately less than 25% of the time. May be withdrawn or combative.  Problem Solving Problem Solving: 1-Solves basic less than 25% of the time - needs direction nearly all the time or does not effectively solve problems and may need a restraint for safety  Memory Memory: 1-Recognizes or recalls less than 25% of the time/requires cueing greater than 75% of the time   Medical Problem List and Plan:  1. DVT Prophylaxis/Anticoagulation: Pharmaceutical: Lovenox  2. Pain Management: tylenol  3. Mood:  low dose celexa, abilify already initiated, but change to HS dosing. Apparently he had been using this as an outpt, but it's unclear how long, why , or if he had been compliant with usage.  4. Neuropsych: This patient is not capable of making decisions on his/her own behalf Reduced intiation  Methylphenidate-d/c if recurrent seizure 5. Chronic renal failure was at baseline with IV hydration at night only but BUN and Creat have crept up on HCTZ +/- ACE I  6. HTN:  Restarted HCTZ but BUN and Creat increased ,will increase cardura ,D/C HCTZ cont  lisinopril-monitor and adjust 7. Dysphagia: D1, pudding thick liquids -could not participate with MBS due to cognition -ivf At night monitor BMET-aggressive oral hygiene as he has poor oral-motor control ? PEG, no significant improvement with megace,mainly food preference issue will discontinue Megace 8.  Post stoke seizure d/o  LOS (Days) 20 A FACE TO FACE EVALUATION WAS PERFORMED  [name],[name] 01/23/2012, 7:20 AM

## 2012-01-23 NOTE — Progress Notes (Signed)
Nutrition Follow-up  Intervention:   1. Recommend nocturnal enteral nutrition regimen to help meet nutritional needs until PO intake improves, as PO intake is only meeting 25% of calorie and <10% of protein needs at this time. Recommend Osmolite 1.5 at 25 ml/hr x 12 hours, advance by 10 ml/hr every 4 hours to goal of 75 ml/hr x 12 hours. 30 ml Prostat via tube BID. This regimen will provide: 1550 kcal, 87 grams protein, 686 ml free water and will provide: 83% kcal needs and 100% protein needs. 2. Recommend consideration of PEG for nutrition support - pt with prolonged suboptimal PO intake since admission and ongoing wt loss 3. RD to continue to follow nutrition care plan  Assessment:   Intake remains very poor. Pt received Dysphagia 2 diet for lunch today and consumed approximately 100 kcal and 0 grams of protein.   Spoke with SLP who confirms that intake remains exceptionally poor.  Skin: skin remains intact Labs: reviewed Pertinent meds: megace discontinued Last BM: 10/8  Diet Order:  Dysphagia 2 diet, Nectar Thickened Liquids Supplements: Ensure Pudding PO TID  Meds: Scheduled Meds:    . ARIPiprazole  1 mg Oral QHS  . citalopram  10 mg Oral Daily  . doxazosin  4 mg Oral QHS  . enoxaparin  40 mg Subcutaneous Daily  . feeding supplement  1 Container Oral TID BM  . hydrocerin   Topical BID  . levETIRAcetam  500 mg Oral BID  . lisinopril  5 mg Oral Daily  . methylphenidate  10 mg Oral BID WC   Continuous Infusions:   PRN Meds:.acetaminophen, alum & mag hydroxide-simeth, bisacodyl, cloNIDine, diphenhydrAMINE, food thickener, guaiFENesin-dextromethorphan, ondansetron (ZOFRAN) IV, prochlorperazine, prochlorperazine, prochlorperazine, RESOURCE THICKENUP CLEAR, senna-docusate, traMADol, traZODone  Labs:  CMP     Component Value Date/Time   NA 137 01/22/2012 0605   K 4.0 01/22/2012 0605   CL 105 01/22/2012 0605   CO2 22 01/22/2012 0605   GLUCOSE 106* 01/22/2012 0605   BUN 31*  01/22/2012 0605   CREATININE 2.40* 01/22/2012 0605   CALCIUM 10.0 01/22/2012 0605   PROT 6.9 01/04/2012 0606   ALBUMIN 3.2* 01/04/2012 0606   AST 23 01/04/2012 0606   ALT 29 01/04/2012 0606   ALKPHOS 90 01/04/2012 0606   BILITOT 0.5 01/04/2012 0606   GFRNONAA 29* 01/22/2012 0605   GFRAA 33* 01/22/2012 0605   Sodium  Date/Time Value Range Status  01/22/2012  6:05 AM 137  135 - 145 mEq/L Final  01/19/2012  6:10 AM 136  135 - 145 mEq/L Final  01/09/2012  6:00 AM 143  135 - 145 mEq/L Final    Potassium  Date/Time Value Range Status  01/22/2012  6:05 AM 4.0  3.5 - 5.1 mEq/L Final  01/19/2012  6:10 AM 3.8  3.5 - 5.1 mEq/L Final  01/09/2012  6:00 AM 4.6  3.5 - 5.1 mEq/L Final    Phosphorus  Date/Time Value Range Status  12/31/2011  5:50 AM 2.8  2.3 - 4.6 mg/dL Final    Magnesium  Date/Time Value Range Status  12/31/2011  5:50 AM 1.9  1.5 - 2.5 mg/dL Final   CBG (last 3)  No results found for this basename: GLUCAP:3 in the last 72 hours   Intake/Output Summary (Last 24 hours) at 01/23/12 1237 Last data filed at 01/23/12 0500  Gross per 24 hour  Intake    180 ml  Output   1150 ml  Net   -970 ml   Weight Status:  216 lb - 10/2 Pt has lost a total of 14 lb since rehab admission.  Body mass index is 32.10 kg/(m^2). Obese Class I  Etimated needs:  1875 -2075 kcal, 80 -90 grams protein  Nutrition Dx:  Inadequate oral intake r/t poor attention AEB poor meal completion. Ongoing.  Goal:  Pt to meet >/= 90% of their estimated nutrition needs - unmet  Monitor:  weight trends, lab trends, I/O's, PO intake, supplement tolerance  Jarold Motto MS, RD, LDN Pager: [phone] After-hours pager: [phone]

## 2012-01-23 NOTE — Progress Notes (Signed)
Speech Language Pathology Daily Session Note  Patient Details  Name: Collin Diaz MRN: [id number] Date of Birth: 1955-12-07  Today's Date: 01/23/2012 Time: 1130-1140 Time Calculation (min): 10 min  Short Term Goals: Week 3: SLP Short Term Goal 1 (Week 3): Pt will utilize respiratory support sufficient to sustain phonation for 5 seconds with mod cues.  SLP Short Term Goal 2 (Week 3): Pt will complete effortful swallow in combination with towel tuck in order to improve hyolaryngeal elevation with mod cues. SLP Short Term Goal 3 (Week 3): Pt will tolerate dysphagia 1 diet with nectar thick liquids with mod assist for precautions.  SLP Short Term Goal 4 (Week 3): Patient will tolerate trials of Dys. 2 textures with max assist multimodal cuing for efficient mastication and timely anterior-posterior transit of bolus SLP Short Term Goal 5 (Week 3): Pt will produce 1 word utterances with mod assist to increase intelligibility. SLP Short Term Goal 6 (Week 3): Pt will demonstrate improved volitional cough as a compensatory strategy for reduced pharyngeal sensation and airway protection.    Skilled Therapeutic Interventions: Group treatment session with OT; SLP facilitated session with increased wait time and mod assist verbal and tactile cues to initiate A-P transit and timely swallows while consuming Dys. 2 textures and nectar thick liquids via cup with no overt s/s of aspiration. Due to min assist cues to portion control.  Patient exhibited frequent oral holding for periods of approximately 1 minute with Dys.2 textures. Patient required mod assist visual cues to check for oral residue and anterior loss of bolus.  When self-feeding, patient exhibits multiple bites per swallow and consecutive sips. At times, these actions (e.g. presenting self with cup or utensil for bites or sips) appear to increase timeliness of swallow; however, at times increased bolus size leads to increased oral transit time. SLP  will continue to monitor performance during self-feeding tasks through the use of skilled observations to dynamically assess what swallow strategies and cuing are most appropriate for patient.   FIM:  FIM - Eating Eating Activity: 5: Set-up assist for open containers;5: Supervision/cues  Pain Pain Assessment Pain Assessment: No/denies pain  Therapy/Group: Individual Therapy  Charlane Ferretti., CCC-SLP [phone]  [name] 01/23/2012, 1:03 PM

## 2012-01-23 NOTE — Progress Notes (Signed)
Occupational Therapy Note  Patient Details  Name: Collin Diaz MRN: [id number] Date of Birth: [birth year] Today's Date: 01/23/2012 Time: 1140-1155 (15 mins)  Pt seen for group treatment session, [employer], with SLP.  OT focused on initiation and sequencing with self-feeding.  Pt required min verbal cues for attention to task, mod verbal cues for throat clearing and swallowing. No vc's needed for initiation or sequencing, with pt initiating multiple sips with drink. Pt with decreased PO intake even on new Dys 2 diet, reporting full.  Pt with no c/o pain this session.  Leonette Monarch 01/23/2012, 1:18 PM

## 2012-01-23 NOTE — Progress Notes (Signed)
Physical Therapy Session Note  Patient Details  Name: Collin Diaz MRN: [id number] Date of Birth: 1955/08/29  Today's Date: 01/23/2012 Time: 1330-1430 Time Calculation (min): 60 min  Short Term Goals: Week 2:  PT Short Term Goal 1 (Week 2): Patient will initiate 25% of the time for bed mobility training with use of UE support and max A PT Short Term Goal 1 - Progress (Week 2): Progressing toward goal PT Short Term Goal 2 (Week 2): Patient will transfer bed <> w/c with max A and initiate 25% of the time  PT Short Term Goal 2 - Progress (Week 2): Progressing toward goal PT Short Term Goal 3 (Week 2): Patient will perform w/c mobility on unit x 50' with R hemi technique and 75% cues for initiation and sequencing  PT Short Term Goal 3 - Progress (Week 2): Progressing toward goal PT Short Term Goal 4 (Week 2): Patient will tolerate dynamic sitting balance and postural control training activities without back support x 15 minutes with mod-max A PT Short Term Goal 4 - Progress (Week 2): Progressing toward goal PT Short Term Goal 5 (Week 2): Patient will tolerate static standing in appropriate lift equipment for NMR to LLE, trunk control and pre-gait activities x 5 minutes  PT Short Term Goal 5 - Progress (Week 2): Progressing toward goal Week 3:  PT Short Term Goal 1 (Week 3): Patient will initiate 25% of the time for bed mobility training with use of UE support and max A PT Short Term Goal 2 (Week 3): Patient will transfer bed <> w/c with max A and initiate 25% of the time PT Short Term Goal 3 (Week 3): Patient will perform w/c mobility on unit x 50' with R hemi technique and 75% cues for initiation and sequencing  PT Short Term Goal 4 (Week 3): Patient will tolerate dynamic sitting balance and postural control training activities without back support x 15 minutes with mod-max A PT Short Term Goal 5 (Week 3): Patient will tolerate static standing in appropriate lift equipment for NMR to LLE,  trunk control and pre-gait activities x 5 minutes   Skilled Therapeutic Interventions/Progress Updates:   Performed bed mobility/rolling and side > sit EOB with rails and +2 A with total verbal, tactile cues for patient to initiate assisting with RUE and RLE; continues to require extra time and encouragement secondary to delayed processing and initiation.  Performed transfer training bed > w/c <> mat with Beazy board to allow patient to be more independent with pushing self across board; patient required max A to L but continued to require total A +2 to transfer to R side with cues at ribs for trunk elongation and rotation during anterior lean; also required total verbal, tactile cues to initiate forward lean from back of w/c and lateral leans for board placement; attempted to facilitate automatic anterior lean by cuing patient to reach down for R foot; patient required max A and min-mod A to maintain and prevent L lateral and posterior lean.  While sitting on mat performed low back stretch with sheet to facilitate anterior pelvic tilt and increased thoracic extension during reaching up to 90 deg shoulder flexion and out of BOS forward and to the R to facilitate increased trunk control during leaning.  At end of session assisted patient with drinking nectar thick apple juice.    Therapy Documentation Precautions:  Precautions Precautions: Fall Precaution Comments: Lt sided hemiparesis Restrictions Weight Bearing Restrictions: No Pain: Pain Assessment Pain Assessment: No/denies  pain at rest; during forward leaning for transfers patient presents with increased flexor tone and reaching/pulling at catheter.  Pt also c/o LBP during anterior/lateral leans   See FIM for current functional status  Therapy/Group: Individual Therapy  [name] [facility] 01/23/2012, 2:57 PM

## 2012-01-24 ENCOUNTER — Encounter (HOSPITAL_COMMUNITY): Payer: Self-pay | Admitting: Occupational Therapy

## 2012-01-24 ENCOUNTER — Inpatient Hospital Stay (HOSPITAL_COMMUNITY): Payer: Medicaid Other | Admitting: Speech Pathology

## 2012-01-24 ENCOUNTER — Inpatient Hospital Stay (HOSPITAL_COMMUNITY): Payer: Medicaid Other | Admitting: Occupational Therapy

## 2012-01-24 ENCOUNTER — Inpatient Hospital Stay (HOSPITAL_COMMUNITY): Payer: Medicaid Other | Admitting: Physical Therapy

## 2012-01-24 NOTE — Progress Notes (Signed)
Occupational Therapy Session Note  Patient Details  Name: Collin Diaz MRN: [id number] Date of Birth: 05/25/1955  Today's Date: 01/24/2012 Time: 0930-1030 Time Calculation (min): 60 min  Short Term Goals: Week 3:  OT Short Term Goal 1 (Week 3): Pt will maintain static sitting for 5 mins with close supervision in preparation for selfcare tasks OT Short Term Goal 2 (Week 3): Pt will perform LB bathing sit to stand with Total +2 (pt 40%).  OT Short Term Goal 3 (Week 3): Pt will perfrom UB dressing in supportive sitting with min facilitation OT Short Term Goal 4 (Week 3): Pt will perform supine to sit with max assist in preparation for selfcare tasks  OT Short Term Goal 5 (Week 3): Pt will perform toilet transfers squat pivot with max assist.  Skilled Therapeutic Interventions/Progress Updates:    Pt seen for ADL retraining with focus on bed mobility, dynamic and static sitting balance, squat pivot and slide board transfers, weight shifting to increase participation with bathing and dressing, and attention/initiation with self-care tasks.  Pt reports needing to have BM, squat pivot transfer +2 total assist from bed to [facility] with multiple squat/scoots.  Pt demonstrated initiation to use RUE to attempt to pull towards Rt.  Pt required +2 assist with perineal care post BM and to pull up pants.  Pt used sink to pull up on for weigh shifting forward to attempt to wash BLEs and pull pants over knees with mod-max verbal cues.  Slide board transfer +2 total assist from [facility] to w/c again on Rt to allow RUE to attempt to help (only initiation, no active pulling to assist with transfer).  Therapy Documentation Precautions:  Precautions Precautions: Fall Precaution Comments: Lt sided hemiparesis Restrictions Weight Bearing Restrictions: No Pain: Pain Assessment Pain Assessment: 0-10 Pain Score: 0-No pain  See FIM for current functional status  Therapy/Group: Individual Therapy  Leonette Monarch 01/24/2012, 10:56 AM

## 2012-01-24 NOTE — Progress Notes (Signed)
Patient ID: Collin Diaz, male   DOB: 1955/11/27, 56 y.o.   MRN: [id number] HPI: Collin Diaz is a 56 y.o. 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 Has history of poor controlled BP and had not had meds filled recently. Admitted 12/30/2011 with weakness, slurring of speech with coughing/chocking and blood pressure 254/143. Patient placed on Cardene drip. Cranial CT and imaging revealed right thalamus and basal ganglia hemorrhage with intraventricular hemorrhage and cytotoxic cerebral edema with right to left midline shift. Evaluated by neurology services with tight blood pressure control with SBP<150. Started on 3% saline drip with goal of 155-160 Na to decrease cerebral edema. Noted dysphagia currently on a dysphagia 1 diet   Subjective/Complaints: Awake and alert.  Po intake overall improving but still at ~50% of meals Fluids still<512ml/24hours Review of Systems  Unable to perform ROS: medical condition  Constitutional: Positive for malaise/fatigue.  Neurological: Positive for focal weakness.     Objective: Vital Signs: Blood pressure 115/81, pulse 80, temperature 98.1 F (36.7 C), temperature source Oral, resp. rate 19, height 5' 8.9" (1.75 m), weight 98.3 kg (216 lb 11.4 oz), SpO2 96.00%. No results found. Results for orders placed during the hospital encounter of 01/03/12 (from the past 72 hour(s))  BASIC METABOLIC PANEL     Status: Abnormal   Collection Time   01/22/12  6:05 AM      Component Value Range Comment   Sodium 137  135 - 145 mEq/L    Potassium 4.0  3.5 - 5.1 mEq/L    Chloride 105  96 - 112 mEq/L    CO2 22  19 - 32 mEq/L    Glucose, Bld 106 (*) 70 - 99 mg/dL    BUN 31 (*) 6 - 23 mg/dL    Creatinine, Ser 8.29 (*) 0.50 - 1.35 mg/dL    Calcium 56.2  8.4 - 10.5 mg/dL    GFR calc non Af Amer 29 (*) >90 mL/min    GFR calc Af Amer 33 (*)  >90 mL/min      HEENT: normal Cardio: RRR Resp: CTA B/L GI: BS positive Extremity:  No Edema Skin:   Intact Neuro: Flat, Cranial Nerve II-XII normal, Abnormal Sensory reduced lt touch on L, Abnormal Motor o/5 on L side, Abnormal FMC Tone  Hypotonia, Dysarthric and Aphasic Musc/Skel:  Swelling Left hand  Assessment/Plan: 1. Functional deficits secondary to R thalamic hemorrhage which require 3+ hours per day of interdisciplinary therapy in a comprehensive inpatient rehab setting. Physiatrist is providing close team supervision and 24 hour management of active medical problems listed below. Physiatrist and rehab team continue to assess barriers to discharge/monitor patient progress toward functional and medical goals. FIM: FIM - Bathing Bathing Steps Patient Completed: Chest;Left Arm;Abdomen;Front perineal area;Right upper leg;Left upper leg Bathing: 3: Mod-Patient completes 5-7 8f 10 parts or 50-74%  FIM - Upper Body Dressing/Undressing Upper body dressing/undressing steps patient completed: Thread/unthread right sleeve of pullover shirt/dresss;Put head through opening of pull over shirt/dress Upper body dressing/undressing: 1: Total-Patient completed less than 25% of tasks FIM - Lower Body Dressing/Undressing Lower body dressing/undressing steps patient completed: Pull pants up/down Lower body dressing/undressing: 1: Two helpers  FIM - Toileting Toileting: 0: No continent bowel/bladder events this shift  FIM - Diplomatic Services operational officer Devices: Psychiatrist Transfers:  (Three helpers)  FIM - Banker Devices: Sliding board;Bed rails;Arm rests  Bed/Chair Transfer: 1: Sit > Supine: Total A (helper does all/Pt. < 25%);1: Supine > Sit: Total A (helper does all/Pt. < 25%);1: Bed > Chair or W/C: Total A (helper does all/Pt. < 25%);2: Chair or W/C > Bed: Max A (lift and lower assist)  FIM - Locomotion:  Wheelchair Distance: 20 Locomotion: Wheelchair: 1: Total Assistance/staff pushes wheelchair (Pt<25%) FIM - Locomotion: Ambulation Locomotion: Ambulation: 0: Activity did not occur  Comprehension Comprehension Mode: Auditory Comprehension: 3-Understands basic 50 - 74% of the time/requires cueing 25 - 50%  of the time  Expression Expression Mode: Verbal Expression: 1-Expresses basis less than 25% of the time/requires cueing greater than 75% of the time.  Social Interaction Social Interaction: 1-Interacts appropriately less than 25% of the time. May be withdrawn or combative.  Problem Solving Problem Solving: 1-Solves basic less than 25% of the time - needs direction nearly all the time or does not effectively solve problems and may need a restraint for safety  Memory Memory: 2-Recognizes or recalls 25 - 49% of the time/requires cueing 51 - 75% of the time   Medical Problem List and Plan:  1. DVT Prophylaxis/Anticoagulation: Pharmaceutical: Lovenox  2. Pain Management: tylenol  3. Mood:  low dose celexa, abilify already initiated, but change to HS dosing. Apparently he had been using this as an outpt, but it's unclear how long, why , or if he had been compliant with usage.  4. Neuropsych: This patient is not capable of making decisions on his/her own behalf Reduced intiation  Methylphenidate-d/c if recurrent seizure 5. Chronic renal failure was at baseline with IV hydration at night only but BUN and Creat have crept up on HCTZ +/- ACE I  6. HTN:  Restarted HCTZ but BUN and Creat increased ,will increase cardura ,D/C HCTZ cont  lisinopril-monitor and adjust 7. Dysphagia: D1, pudding thick liquids -could not participate with MBS due to cognition -ivf At night monitor BMET-aggressive oral hygiene as he has poor oral-motor control ? PEG, no significant improvement with megace,mainly food preference issue will discontinue Megace 8.  Post stoke seizure d/o  LOS (Days) 21 A FACE TO FACE  EVALUATION WAS PERFORMED  Jaymie Misch E 01/24/2012, 6:58 AM

## 2012-01-24 NOTE — Patient Care Conference (Signed)
Inpatient RehabilitationTeam Conference Note Date: 01/24/2012   Time: 10:40 AM    Patient Name: Collin Diaz      Medical Record Number: [id number]  Date of Birth: 1956-02-04 Sex: Male         Room/Bed: 4032/4032-01 Payor Info: Payor: MEDICAID PENDING  Plan: MEDICAID PENDING  Product Type: *No Product type*     Admitting Diagnosis: ICH  Admit Date/Time:  01/03/2012  4:50 PM Admission Comments: No comment available   Primary Diagnosis:  Thalamic hemorrhage Principal Problem: Thalamic hemorrhage  Patient Active Problem List   Diagnosis Date Noted  . Syncope 01/09/2012  . Induced hypernatremia 01/03/2012  . Obesity (BMI 30.0-34.9) 01/03/2012  . OSA (obstructive sleep apnea) 01/03/2012  . Renal insufficiency 01/03/2012  . Chronic ischemic left MCA stroke 01/03/2012  . Aphasia as late effect of cerebrovascular accident 01/03/2012  . Depression 01/03/2012  . 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 ALLERGIC RHINITIS 02/07/2007  . FACIAL RASH 02/07/2007  . GOUT NOS 08/18/2006  . Malignant hypertension 08/18/2006  . KIDNEY DISEASE, CHRONIC, STAGE V 08/18/2006    Expected Discharge Date:    Team Members Present: Physician: Dr. Claudette Laws Social Worker Present: Dossie Der, LCSW Nurse Present: Other (comment) Feliz Beam) PT Present: [name], PT;Caroline Adriana Simas, PT OT Present: Bretta Bang, Verlene Mayer, OT SLP Present: Fae Pippin, SLP Other (Discipline and Name): Charolette Child Coordinator     Current Status/Progress Goal Weekly Team Focus  Medical   po intake continues  establish enteral feeds  discuss PEG with wife   Bowel/Bladder   LBM : 01/23/12, Foley catheter due to urinary retention/ incontinent of bowel  become continent of bowel and bladder  become continent of bowel and bladder   Swallow/Nutrition/ Hydration   Dys. 1 textures and nectar thick liquids, trials of Dys. 2 textures, max-mod A  least restrictive p.o. intake  continue trials of Dys.  2 textures to determine if diet upgrade is appropriate   ADL's   mod assist bathing, mod assist grooming, max assist UB dressing, +2 LBdressing and transfers. Pt demonstrating increased initiation, however requires increased time and cues to follow task through to completion   downgraded goals: mod UB bathing and dressing, max assist LB bathing and dressing and transfers  trunk control with static and dynamic sitting, increased initiation and follow through of tasks   Mobility   max-total A  min A w/c mobility, mod A basic transfers/bed mobility  postural control, balance, transfers   Communication   max assist  mod assist   increase initiation and sustained attention   Safety/Cognition/ Behavioral Observations  max assist   mod assist   increase sustained attention and initiation   Pain   No pain issue/ denied pain  free of pain  free of pain   Skin   No skin issue  remain free of skin breakdown  remain free of skin breakdown      *See Interdisciplinary Assessment and Plan and progress notes for long and short-term goals  Barriers to Discharge: no 24/7 care    Possible Resolutions to Barriers:  SNF    Discharge Planning/Teaching Needs:  Wife has not been in or participated in any therapies.  Need pt's po intake to be addressed regarding needing a PEG, then can pursue NHP      Team Discussion:  More alert, doesn't like lunch or dinner choices.  Poor po intake, MD to get GI consult for PEG placement. Once medically ready try to find NH bed.  Revisions to Treatment Plan:  Pt for NHP   Continued Need for Acute Rehabilitation Level of Care: The patient requires daily medical management by a physician with specialized training in physical medicine and rehabilitation for the following conditions: Daily direction of a multidisciplinary physical rehabilitation program to ensure safe treatment while eliciting the highest outcome that is of practical value to the patient.: Yes Daily  medical management of patient stability for increased activity during participation in an intensive rehabilitation regime.: Yes Daily analysis of laboratory values and/or radiology reports with any subsequent need for medication adjustment of medical intervention for : Neurological problems  Hema Lanza, [name] 01/24/2012, 1:25 PM

## 2012-01-24 NOTE — Progress Notes (Signed)
Physical Therapy Weekly Progress Note  Patient Details  Name: Collin Diaz MRN: [id number] Date of Birth: 12/01/1955  Today's Date: 01/24/2012 Time: 1330-1430 Time Calculation (min): 60 min  Patient continues to make slow progress and has met 2 of 5 short term goals.  Patient is currently max A-total A for bed mobility on flat bed with rail, max-total A for slide board transfers, max A w/c mobility in controlled environment, and continues to require total A of lift equipment for static standing for postural control and LLE WB training.  Secondary to slow progress patient's goals have been downgraded to mod A overall for basic transfers and min A w/c mobility to allow patient to be at higher level of function prior to D/C to SNF.    Patient continues to demonstrate the following deficits: increased lethargy, impaired attention, initiation, delayed processing, apraxia, L hemiplegia with increased tone, impaired trunk and postural control, impaired balance and therefore will continue to benefit from skilled PT intervention to enhance overall performance with activity tolerance, balance, postural control, ability to compensate for deficits, functional use of  left upper extremity and left lower extremity, attention and coordination.  Patient not progressing toward long term goals.  See goal revision..  Plan of care revisions: Due to slow progress and SNF placement patient's goals downgraded to mod A basic transfers and min A w/c mobility.  PT Short Term Goals Week 3:  PT Short Term Goal 1 (Week 3): Patient will initiate 25% of the time for bed mobility training with use of UE support and max A PT Short Term Goal 1 - Progress (Week 3): Met PT Short Term Goal 2 (Week 3): Patient will transfer bed <> w/c with max A and initiate 25% of the time PT Short Term Goal 2 - Progress (Week 3): Partly met PT Short Term Goal 3 (Week 3): Patient will perform w/c mobility on unit x 50' with R hemi technique and  75% cues for initiation and sequencing  PT Short Term Goal 3 - Progress (Week 3): Partly met PT Short Term Goal 4 (Week 3): Patient will tolerate dynamic sitting balance and postural control training activities without back support x 15 minutes with mod-max A PT Short Term Goal 4 - Progress (Week 3): Met PT Short Term Goal 5 (Week 3): Patient will tolerate static standing in appropriate lift equipment for NMR to LLE, trunk control and pre-gait activities x 5 minutes  PT Short Term Goal 5 - Progress (Week 3): Partly met Week 4:  PT Short Term Goal 1 (Week 4): = LTG  Skilled Therapeutic Interventions/Progress Updates:   Patient sleeping in bed; required max A for rolling to R side and total A for side to sit secondary to decreased trunk ROM; performed transfer bed > w/c with Beazy board and max-total A to R side with focus on R trunk elongation to depress and weight shift to R pelvis to WB through beazy disc and allow disc to slide across board.  Once in gym attempted to perform sit > stand with [facility] but unable with +2 A secondary to impaired ability to perform anterior pelvic tilts to bring COG over BOS; performed low back stretch with sheet around lumbar spine and maintain anterior pelvic tilt during reaching up and out of BOS to R for trunk elongation and lateral trunk excursion; patient able to initiate <25% of the time.  Placed lumbar roll in back of chair to maintain anterior pelvic tilt in resting.   Therapy  Documentation Precautions:  Precautions Precautions: Fall Precaution Comments: Lt sided hemiparesis Restrictions Weight Bearing Restrictions: No Vital Signs: Therapy Vitals Temp: 98.6 F (37 C) Temp src: Oral Pulse Rate: 82  Resp: 18  BP: 110/76 mmHg Patient Position, if appropriate: Lying Oxygen Therapy SpO2: 95 % O2 Device: None (Room air) Pain: Pain Assessment Pain Assessment: 0-10 Pain Score:   2 Pain Type: Acute pain Pain Location: Hip Pain Orientation: Right Pain  Descriptors: Nagging Pain Onset: Gradual Pain Intervention(s): RN made aware  See FIM for current functional status  Therapy/Group: Individual Therapy  [name] [facility] 01/24/2012, 3:46 PM

## 2012-01-24 NOTE — Progress Notes (Signed)
The above skilled treatment note has been reviewed and SLP is in agreement. Rc Amison, M.A., CCC-SLP [phone]  

## 2012-01-24 NOTE — Progress Notes (Signed)
Speech Language Pathology Daily Session Note  Patient Details  Name: Collin Diaz MRN: [id number] Date of Birth: 1955/08/24  Today's Date: 01/24/2012 Time: 1130-1215 Time Calculation (min): 45 min  Short Term Goals: Week 3: SLP Short Term Goal 1 (Week 3): Pt will utilize respiratory support sufficient to sustain phonation for 5 seconds with mod cues.  SLP Short Term Goal 2 (Week 3): Pt will complete effortful swallow in combination with towel tuck in order to improve hyolaryngeal elevation with mod cues. SLP Short Term Goal 3 (Week 3): Pt will tolerate dysphagia 1 diet with nectar thick liquids with mod assist for precautions.  SLP Short Term Goal 4 (Week 3): Patient will tolerate trials of Dys. 2 textures with max assist multimodal cuing for efficient mastication and timely anterior-posterior transit of bolus SLP Short Term Goal 5 (Week 3): Pt will produce 1 word utterances with mod assist to increase intelligibility. SLP Short Term Goal 6 (Week 3): Pt will demonstrate improved volitional cough as a compensatory strategy for reduced pharyngeal sensation and airway protection.    Skilled Therapeutic Interventions: Treatment session addressed dysphagia and speech intelligibility goals. SLP facilitated session with increased wait time and mod assist verbal and tactile cues to initiate A-P transit and timely swallows while consuming Dys. 2 trials and nectar thick liquids via cup with no overt s/s of aspiration. Patient required mod assist verbal cues to take small sips and go slow. Patient exhibited less frequent oral holding for periods of approximately 15 seconds with nectar thick liquids. Patient required min-mod assist verbal and visual cues to check for oral residue and anterior loss of bolus. Patient continues to demonstrate weak volitional cough with max assist visual, verbal, and tactile cuing.  SLP also facilitated the session with max faded to mod assist verbal cues to initiate 2-3 word  utterance responses to direct questions.  Patient phonated for a max of 6 seconds at an increased vocal intensity with mod assist verbal and visual cues.  Patient noted pain in his right hip that did not impact his attention during therapy, nursing notified.     FIM:  Comprehension Comprehension Mode: Auditory Comprehension: 4-Understands basic 75 - 89% of the time/requires cueing 10 - 24% of the time Expression Expression: 2-Expresses basic 25 - 49% of the time/requires cueing 50 - 75% of the time. Uses single words/gestures. Social Interaction Social Interaction: 2-Interacts appropriately 25 - 49% of time - Needs frequent redirection. Problem Solving Problem Solving: 2-Solves basic 25 - 49% of the time - needs direction more than half the time to initiate, plan or complete simple activities Memory Memory: 3-Recognizes or recalls 50 - 74% of the time/requires cueing 25 - 49% of the time FIM - Eating Eating Activity: 5: Supervision/cues;5: Set-up assist for open containers  Pain Pain Assessment Pain Assessment: 0-10 Pain Score:   2 Pain Type: Acute pain Pain Location: Hip Pain Orientation: Right Pain Descriptors: Nagging Pain Onset: Gradual Pain Intervention(s): RN made aware  Therapy/Group: Individual Therapy  Jackalyn Lombard, Conrad [place]  Graduate Clinician Speech Language Pathology   Jatziri Goffredo, Joni Reining 01/24/2012, 1:57 PM

## 2012-01-25 ENCOUNTER — Inpatient Hospital Stay (HOSPITAL_COMMUNITY): Payer: Self-pay | Admitting: Physical Therapy

## 2012-01-25 ENCOUNTER — Inpatient Hospital Stay (HOSPITAL_COMMUNITY): Payer: Medicaid Other | Admitting: Speech Pathology

## 2012-01-25 ENCOUNTER — Encounter (HOSPITAL_COMMUNITY): Payer: Self-pay | Admitting: Occupational Therapy

## 2012-01-25 ENCOUNTER — Encounter (HOSPITAL_COMMUNITY): Payer: Self-pay | Admitting: Radiology

## 2012-01-25 ENCOUNTER — Inpatient Hospital Stay (HOSPITAL_COMMUNITY): Payer: Medicaid Other | Admitting: Physical Therapy

## 2012-01-25 LAB — APTT: aPTT: 38 seconds — ABNORMAL HIGH (ref 24–37)

## 2012-01-25 LAB — CBC
HCT: 35.3 % — ABNORMAL LOW (ref 39.0–52.0)
MCH: 29.9 pg (ref 26.0–34.0)
MCV: 87.8 fL (ref 78.0–100.0)
Platelets: 176 10*3/uL (ref 150–400)
RBC: 4.02 MIL/uL — ABNORMAL LOW (ref 4.22–5.81)
RDW: 13 % (ref 11.5–15.5)

## 2012-01-25 LAB — BASIC METABOLIC PANEL
Chloride: 100 mEq/L (ref 96–112)
Creatinine, Ser: 2.72 mg/dL — ABNORMAL HIGH (ref 0.50–1.35)
GFR calc Af Amer: 29 mL/min — ABNORMAL LOW (ref >90)
Potassium: 4 mEq/L (ref 3.5–5.1)
Sodium: 138 mEq/L (ref 135–145)

## 2012-01-25 MED ORDER — CEFAZOLIN SODIUM-DEXTROSE 2-3 GM-% IV SOLR
2.0000 g | Freq: Once | INTRAVENOUS | Status: AC
  Administered 2012-01-26: 2 g via INTRAVENOUS
  Filled 2012-01-25: qty 50

## 2012-01-25 MED ORDER — ENOXAPARIN SODIUM 40 MG/0.4ML ~~LOC~~ SOLN
40.0000 mg | Freq: Every day | SUBCUTANEOUS | Status: DC
  Filled 2012-01-25 (×2): qty 0.4

## 2012-01-25 NOTE — Progress Notes (Signed)
Speech Language Pathology Daily Session Note and Weekly Progress Report  Patient Details  Name: Collin Diaz MRN: [id number] Date of Birth: 1956/03/27  Today's Date: 01/25/2012 Time: 1130-1215 Time Calculation (min): 45 min  Short Term Goals: Week 3: SLP Short Term Goal 1 (Week 3): Pt will utilize respiratory support sufficient to sustain phonation for 5 seconds with mod cues.  SLP Short Term Goal 2 (Week 3): Pt will complete effortful swallow in combination with towel tuck in order to improve hyolaryngeal elevation with mod cues. SLP Short Term Goal 3 (Week 3): Pt will tolerate dysphagia 1 diet with nectar thick liquids with mod assist for precautions.  SLP Short Term Goal 4 (Week 3): Patient will tolerate trials of Dys. 2 textures with max assist multimodal cuing for efficient mastication and timely anterior-posterior transit of bolus SLP Short Term Goal 5 (Week 3): Pt will produce 1 word utterances with mod assist to increase intelligibility. SLP Short Term Goal 6 (Week 3): Pt will demonstrate improved volitional cough as a compensatory strategy for reduced pharyngeal sensation and airway protection.    Skilled Therapeutic Interventions: Treatment session addressed dysphagia and speech intelligibility goals. SLP facilitated session with increased wait time and mod assist verbal and tactile cues to initiate A-P transit and timely swallows while consuming Dys. 1 textures and nectar thick liquids via cup with cough x1 caused by increased bolus size and multiple sips per swallow. Patient refused Dys.2 textures today.  Patient required max assist verbal and tactile cues to take small sips and go slow. Patient exhibited less frequent oral holding for periods of approximately 15 seconds with textures and liquids. Patient required min-mod assist verbal and visual cues to check for oral residue and anterior loss of bolus. Patient continues to demonstrate weak volitional cough with max assist visual,  verbal, and tactile cuing. SLP also facilitated the session with max faded to mod assist verbal cues to initiate 2-3 word utterance responses to direct questions. Patient phonated for a max of 6 seconds at an increased vocal intensity with mod assist verbal and visual cues.  Patient also completed an automatic verbal sequence with max faded to mod assist verbal and visual cues to utilize adequate breath support to complete sequence.  Recommend down grade to Dys.1 textures.  FIM:  Comprehension Comprehension: 4-Understands basic 75 - 89% of the time/requires cueing 10 - 24% of the time Expression Expression Mode: Verbal Expression: 2-Expresses basic 25 - 49% of the time/requires cueing 50 - 75% of the time. Uses single words/gestures. Social Interaction Social Interaction: 2-Interacts appropriately 25 - 49% of time - Needs frequent redirection. Problem Solving Problem Solving: 2-Solves basic 25 - 49% of the time - needs direction more than half the time to initiate, plan or complete simple activities Memory Memory: 3-Recognizes or recalls 50 - 74% of the time/requires cueing 25 - 49% of the time FIM - Eating Eating Activity: 5: Supervision/cues;5: Set-up assist for open containers  Pain Pain Assessment Pain Assessment: No/denies pain  Therapy/Group: Individual Therapy  PageJoni Reining 01/25/2012, 12:42 PM   Speech Language Pathology Weekly Progress Note  Patient Details  Name: Collin Diaz MRN: [id number] Date of Birth: 04-19-1955  Today's Date: 01/25/2012  Short Term Goals: Week 3: SLP Short Term Goal 1 (Week 3): Pt will utilize respiratory support sufficient to sustain phonation for 5 seconds with mod cues.  SLP Short Term Goal 1 - Progress (Week 3): Met SLP Short Term Goal 2 (Week 3): Pt will complete effortful swallow  in combination with towel tuck in order to improve hyolaryngeal elevation with mod cues. SLP Short Term Goal 2 - Progress (Week 3): Not met SLP Short Term  Goal 3 (Week 3): Pt will tolerate dysphagia 1 diet with nectar thick liquids with mod assist for precautions.  SLP Short Term Goal 3 - Progress (Week 3): Progressing toward goal SLP Short Term Goal 4 (Week 3): Patient will tolerate trials of Dys. 2 textures with max assist multimodal cuing for efficient mastication and timely anterior-posterior transit of bolus SLP Short Term Goal 4 - Progress (Week 3): Progressing toward goal SLP Short Term Goal 5 (Week 3): Pt will produce 1 word utterances with mod assist to increase intelligibility. SLP Short Term Goal 5 - Progress (Week 3): Met SLP Short Term Goal 6 (Week 3): Pt will demonstrate improved volitional cough as a compensatory strategy for reduced pharyngeal sensation and airway protection.   SLP Short Term Goal 6 - Progress (Week 3): Progressing toward goal Week 4: SLP Short Term Goal 1 (Week 4): Pt will utilize respiratory support sufficient to sustain phonation for 10 seconds with mod cues. SLP Short Term Goal 2 (Week 4): Patient will tolerate trials of Dys. 2 textures with max assist multimodal cuing for efficient mastication and timely anterior-posterior transit of bolus  SLP Short Term Goal 3 (Week 4): Pt will produce 3-5 word utterances with mod assist to increase intelligibility.  SLP Short Term Goal 4 (Week 4): Pt will demonstrate improved volitional cough as a compensatory strategy for reduced pharyngeal sensation and airway protection. SLP Short Term Goal 5 (Week 4): Pt will tolerate dysphagia 1 diet with nectar thick liquids with mod assist for precautions.  Weekly Progress Updates: Patient met 2 out of 6 short term goals for this reporting period with gains in respiratory support for sustaining phonation and average length of utterance. SLP initiated trials of Dys. 2 textures during this report; however, patient's decreased p.o. intake seems to be impacted by patient's preference for smooth Dys. 1 textures and nectar thick liquids.  When  consuming Dys. 2 trials, patient exhibited prolonged and inefficient mastication and oral holding of bolus with max assist cuing to increase timeliness of A-P transit.  Patient also continues to demonstrate oral holding of Dys. 1 textures and nectar thick liquides for periods of approximately 30 seconds to 1 minute and requires mod-max assist cues to initiate timely oral transit.  Patient continues to demonstrate weak volitional cough as an airway protection mechanism with max assist visual, verbal and tactile cuing, although reflexive cough sounds stronger.  SLP did not address pharyngeal strengthening goals this reporting period in order to monitor patient more closely with trials of Dys. 2 textures and to directly target improving patient's respiratory support.  Patient verbalizes 2-3 word utterances with mod-max assist verbal cues to initiate vocalization. Patient continues to attempt to communicate largely through gestures although he is able to verbalize at the phrase level with mod-max assist cues. Patient completes automatic verbal sequences with mod-max assist visual and verbal cuing to coordinate breath support to increase vocal intensity.  Patient's arousal fluctuated during this reporting period, and lethargy still appears to impact performance during his sessions. Patient continues to be a good candidate for speech services during stay on CIR in order to maximize functional independence and reduce burden of care upon discharge.    SLP Frequency: 1-2 X/day, 30-60 minutes;5 out of 7 days Estimated Length of Stay: d/c anticipated 10/13 SLP Treatment/Interventions: Cognitive remediation/compensation;Cueing hierarchy;Dysphagia/aspiration precaution training;Environmental  controls;Functional tasks;Internal/external aids;Patient/family education;Speech/Language facilitation;Therapeutic Activities  Jackalyn Lombard, [place]  Graduate Clinician Speech Language Pathology  Page, Joni Reining 01/25/2012, 1:25  PM  The above skilled treatment note has been reviewed and SLP is in agreement. Fae Pippin, M.A., CCC-SLP [phone]

## 2012-01-25 NOTE — Progress Notes (Signed)
Social Work Patient ID: Collin Diaz, male   DOB: [birth year], 56 y.o.   MRN: [id number] Have left a message for wife to discuss team conference and met with pt to inform.  GI consult to discuss need for a PEG for nutrition. Will then be able to look for placement for pt.  Await return call.

## 2012-01-25 NOTE — H&P (Signed)
Collin Diaz is an 56 y.o. male.   Chief Complaint: CVA 2012; recent CVA 10/2011 Aphasia; dysphagia Malnutrition Scheduled for percutaneous gastric tube placement 10/11 HPI: CVAx 2; HTN  Past Medical History  Diagnosis Date  . Hypertension   . Stroke     No past surgical history on file.  No family history on file. Social History:  reports that he has never smoked. He does not have any smokeless tobacco history on file. He reports that he drinks alcohol. He reports that he does not use illicit drugs.  Allergies:  Allergies  Allergen Reactions  . Nsaids     Unknown    Medications Prior to Admission  Medication Sig Dispense Refill  . allopurinol (ZYLOPRIM) 100 MG tablet Take 100 mg by mouth daily.      . ARIPiprazole (ABILIFY) 2 MG tablet Take 2 mg by mouth daily.      Marland Kitchen doxazosin (CARDURA) 4 MG tablet Take 4 mg by mouth at bedtime.      . hydrochlorothiazide (HYDRODIURIL) 25 MG tablet Take 25 mg by mouth daily.      Marland Kitchen lisinopril-hydrochlorothiazide (PRINZIDE,ZESTORETIC) 20-25 MG per tablet Take 1 tablet by mouth daily.      Marland Kitchen NIFEdipine (PROCARDIA XL/ADALAT-CC) 60 MG 24 hr tablet Take 60 mg by mouth daily.      . STUDY MEDICATION Take 1 tablet by mouth daily. Either Febexustat, Allopurinol, or Placebo        No results found for this or any previous visit (from the past 48 hour(s)). No results found.  Review of Systems  Constitutional: Negative for fever and weight loss.  Respiratory: Negative for shortness of breath.   Cardiovascular: Negative for chest pain.  Gastrointestinal: Negative for nausea and vomiting.  Musculoskeletal: Negative for back pain.  Neurological: Positive for speech change, focal weakness and weakness.  Psychiatric/Behavioral:       Aphasia; slowly communicative    Blood pressure 111/76, pulse 86, temperature 98.4 F (36.9 C), temperature source Oral, resp. rate 19, height 5' 8.9" (1.75 m), weight 216 lb 11.4 oz (98.3 kg), SpO2  94.00%. Physical Exam  Constitutional: He appears well-developed and well-nourished.  Cardiovascular: Normal rate, regular rhythm and normal heart sounds.   Respiratory: Effort normal and breath sounds normal. He has no wheezes.  GI: Soft. Bowel sounds are normal. There is no tenderness.  Musculoskeletal:       Uses wc; very slow movement of all extr  Neurological:       Can chew; but barely swallow Communicates very slowly Not able to consent for self  Skin: Skin is warm and dry.  Psychiatric:       Call in to niece Consent in chart ready for signature     Assessment/Plan CVA x 2 Dysphagia; aphasia; malnutrition Unable to communicate appropriately Scheduled for G tube in IR 10/11 Consent in chart ready for signature Call in to niece; wife cell disconnected Lovenox held KUB in am Ancef on call  Rayson Rando A 01/25/2012, 10:23 AM

## 2012-01-25 NOTE — Progress Notes (Signed)
Physical Therapy Session Note  Patient Details  Name: Collin Diaz MRN: [id number] Date of Birth: 01/22/1956  Today's Date: 01/25/2012 Time: 1430-1530 Time Calculation (min): 60 min  Short Term Goals: Week 3:  PT Short Term Goal 1 (Week 3): Patient will initiate 25% of the time for bed mobility training with use of UE support and max A PT Short Term Goal 1 - Progress (Week 3): Met PT Short Term Goal 2 (Week 3): Patient will transfer bed <> w/c with max A and initiate 25% of the time PT Short Term Goal 2 - Progress (Week 3): Partly met PT Short Term Goal 3 (Week 3): Patient will perform w/c mobility on unit x 50' with R hemi technique and 75% cues for initiation and sequencing  PT Short Term Goal 3 - Progress (Week 3): Partly met PT Short Term Goal 4 (Week 3): Patient will tolerate dynamic sitting balance and postural control training activities without back support x 15 minutes with mod-max A PT Short Term Goal 4 - Progress (Week 3): Met PT Short Term Goal 5 (Week 3): Patient will tolerate static standing in appropriate lift equipment for NMR to LLE, trunk control and pre-gait activities x 5 minutes  PT Short Term Goal 5 - Progress (Week 3): Partly met    Therapy Documentation  Precautions:  Precautions Precautions: Fall Precaution Comments: Lt sided hemiparesis Restrictions Weight Bearing Restrictions: No    Pain: Pain Assessment Pain Assessment: Faces Faces Pain Scale: Hurts even more Pain Type: Acute pain Pain Location: Knee Pain Orientation: Right Pain Descriptors: Discomfort Pain Onset: Gradual Patients Stated Pain Goal: 2 Pain Intervention(s): Ambulation/increased activity Multiple Pain Sites: Yes 2nd Pain Site Wong-Baker Pain Rating: Hurts a little bit Pain Type: Chronic pain Pain Location: Back Pain Orientation: Lower Pain Descriptors: Sharp;Discomfort Pain Frequency: Constant Pain Onset: Gradual Patient's Stated Pain Goal: 2 Pain Intervention(s):  Ambulation/increased activity;RN made aware      Other Treatments:  Patient performed supine > sit transfer to EOB using bed rails with RUE with total assist x 2 helpers. Patient demonstrated the ability to initiate movement of RLE and RUE during rolling. Patient performed bed > w/c transfer using sliding board with total assist x 2 helpers. Patient was taken to gym by PT in w/c. Patient stood for >20 min in standing frame while playing Wii with emphasis on upright/erect posture, postural control, and dynamic balance. Patient presented with increased difficulty maintaining good posture resulting in decrease ability to get full ROM at RUE. Patient required manual facilitation to achieve upright posture, in which he was able to demonstrate improved RUE ROM. Patient continues to show decreased initiation throughout today's session, but was more verbal today.  Patient was educated in sitting exercises that he can do in is room, independently. Exercises included: marching, LAQ, and AP. Patient performed each x 5 reps on RLE. Patient unable to elicit LLE movement, but PT continued to encourage trying. Patient verbalized/demonstrated understanding. Patient was left in room in w/c at end of session with call bell in reach.   See FIM for current functional status  Therapy/Group: Individual Therapy  Tawana Pasch [place] DPT Student  01/25/2012, 3:53 PM

## 2012-01-25 NOTE — Progress Notes (Signed)
I agree with this assessment 

## 2012-01-25 NOTE — Progress Notes (Signed)
Occupational Therapy Session Note  Patient Details  Name: Collin Diaz MRN: [id number] Date of Birth: [birth year]  Today's Date: 01/25/2012 Time: 0900-1002 Time Calculation (min): 62 min  Short Term Goals: Week 3:  OT Short Term Goal 1 (Week 3): Pt will maintain static sitting for 5 mins with close supervision in preparation for selfcare tasks OT Short Term Goal 2 (Week 3): Pt will perform LB bathing sit to stand with Total +2 (pt 40%).  OT Short Term Goal 3 (Week 3): Pt will perfrom UB dressing in supportive sitting with min facilitation OT Short Term Goal 4 (Week 3): Pt will perform supine to sit with max assist in preparation for selfcare tasks  OT Short Term Goal 5 (Week 3): Pt will perform toilet transfers squat pivot with max assist.  Skilled Therapeutic Interventions/Progress Updates:    Bathing and dressing session sitting EOB.  Pt able to demonstrate static sitting balance EOB with close supervision but does exhibit increased posterior pelvic tilt and posterior lean.  Required max assist for dynamic sitting balance when reaching for washcloth or attempting to wash himself.  Frequent LOB to the right or posteriorly.  Total hand over hand assist to integrate the LUE into functional task.  Total +2 (pt 25%) for sit to stand when performing peri care or pulling undergarment and pants over his hips.  Able to follow one step commands consistently throughout session and maintain sustained attention to selfcare tasks.  Utilized sliding board for transfer from bed to wheelchair.  Therapy Documentation Precautions:  Precautions Precautions: Fall Precaution Comments: Lt sided hemiparesis Restrictions Weight Bearing Restrictions: No  Pain: Pain Assessment Pain Assessment: No/denies pain ADL: See FIM for current functional status  Therapy/Group: Individual Therapy  Thi Sisemore OTR/L 01/25/2012, 12:28 PM

## 2012-01-25 NOTE — Progress Notes (Signed)
Patient ID: Collin Diaz, male   DOB: 1956/04/03, 56 y.o.   MRN: [id number] HPI: Collin Diaz is a 56 y.o. 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 Has history of poor controlled BP and had not had meds filled recently. Admitted 12/30/2011 with weakness, slurring of speech with coughing/chocking and blood pressure 254/143. Patient placed on Cardene drip. Cranial CT and imaging revealed right thalamus and basal ganglia hemorrhage with intraventricular hemorrhage and cytotoxic cerebral edema with right to left midline shift. Evaluated by neurology services with tight blood pressure control with SBP<150. Started on 3% saline drip with goal of 155-160 Na to decrease cerebral edema. Noted dysphagia currently on a dysphagia 1 diet   Subjective/Complaints: Awake and alert.  Po intake overall improving but still at <50% of meals Fluids still<568ml/24hours Discussed PEG with pt Review of Systems  Unable to perform ROS: medical condition  Constitutional: Positive for malaise/fatigue.  Neurological: Positive for focal weakness.     Objective: Vital Signs: Blood pressure 111/76, pulse 86, temperature 98.4 F (36.9 C), temperature source Oral, resp. rate 19, height 5' 8.9" (1.75 m), weight 98.3 kg (216 lb 11.4 oz), SpO2 94.00%. No results found. No results found for this or any previous visit (from the past 72 hour(s)).   HEENT: normal Cardio: RRR Resp: CTA B/L GI: BS positive Extremity:  No Edema Skin:   Intact Neuro: Flat, Cranial Nerve II-XII normal, Abnormal Sensory reduced lt touch on L, Abnormal Motor o/5 on L side, Abnormal FMC Tone  Hypotonia, Dysarthric and Aphasic Musc/Skel:  Swelling Left hand  Assessment/Plan: 1. Functional deficits secondary to R thalamic hemorrhage which require 3+ hours per day of interdisciplinary therapy in a comprehensive  inpatient rehab setting. Physiatrist is providing close team supervision and 24 hour management of active medical problems listed below. Physiatrist and rehab team continue to assess barriers to discharge/monitor patient progress toward functional and medical goals. FIM: FIM - Bathing Bathing Steps Patient Completed: Chest;Left Arm;Abdomen;Front perineal area;Right upper leg;Left upper leg Bathing: 3: Mod-Patient completes 5-7 29f 10 parts or 50-74%  FIM - Upper Body Dressing/Undressing Upper body dressing/undressing steps patient completed: Thread/unthread right sleeve of pullover shirt/dresss;Put head through opening of pull over shirt/dress Upper body dressing/undressing: 1: Total-Patient completed less than 25% of tasks FIM - Lower Body Dressing/Undressing Lower body dressing/undressing steps patient completed: Pull pants up/down Lower body dressing/undressing: 1: Two helpers  FIM - Toileting Toileting: 1: Total-Patient completed zero steps, helper did all 3  FIM - Diplomatic Services operational officer Devices: Bedside commode Toilet Transfers: 1-To toilet/BSC: Total A (helper does all/Pt. < 25%);1-From toilet/BSC: Total A (helper does all/Pt. < 25%);1-Two helpers  FIM - Banker Devices: Bed rails;Sliding board Bed/Chair Transfer: 1: Supine > Sit: Total A (helper does all/Pt. < 25%);1: Sit > Supine: Total A (helper does all/Pt. < 25%);1: Bed > Chair or W/C: Total A (helper does all/Pt. < 25%);1: Chair or W/C > Bed: Total A (helper does all/Pt. < 25%)  FIM - Locomotion: Wheelchair Distance: 20 Locomotion: Wheelchair: 1: Total Assistance/staff pushes wheelchair (Pt<25%) FIM - Locomotion: Ambulation Locomotion: Ambulation: 0: Activity did not occur  Comprehension Comprehension Mode: Auditory Comprehension: 4-Understands basic 75 - 89% of the time/requires cueing 10 - 24% of the time  Expression Expression Mode: Verbal Expression:  2-Expresses basic 25 - 49% of the time/requires cueing 50 - 75% of the time. Uses  single words/gestures.  Social Interaction Social Interaction: 2-Interacts appropriately 25 - 49% of time - Needs frequent redirection.  Problem Solving Problem Solving: 2-Solves basic 25 - 49% of the time - needs direction more than half the time to initiate, plan or complete simple activities  Memory Memory: 3-Recognizes or recalls 50 - 74% of the time/requires cueing 25 - 49% of the time   Medical Problem List and Plan:  1. DVT Prophylaxis/Anticoagulation: Pharmaceutical: Lovenox  2. Pain Management: tylenol  3. Mood:  low dose celexa, abilify already initiated, but change to HS dosing. Apparently he had been using this as an outpt, but it's unclear how long, why , or if he had been compliant with usage.  4. Neuropsych: This patient is not capable of making decisions on his/her own behalf Reduced intiation  Methylphenidate-d/c if recurrent seizure 5. Chronic renal failure was at baseline with IV hydration at night only but BUN and Creat have crept up on HCTZ +/- ACE I  6. HTN:  Restarted HCTZ but BUN and Creat increased ,will increase cardura ,D/C HCTZ cont  lisinopril-monitor and adjust 7. Dysphagia: D1, pudding thick liquids -could not participate with MBS due to cognition -ivf At night monitor BMET-aggressive oral hygiene as he has poor oral-motor control Rec PEG since it will be 1-2 months before pt can be upgraded to regular diet and fluids, 8.  Post stoke seizure d/o  LOS (Days) 22 A FACE TO FACE EVALUATION WAS PERFORMED  Mayukha Symmonds E 01/25/2012, 7:06 AM

## 2012-01-26 ENCOUNTER — Inpatient Hospital Stay (HOSPITAL_COMMUNITY): Payer: Medicaid Other | Admitting: Physical Therapy

## 2012-01-26 ENCOUNTER — Inpatient Hospital Stay (HOSPITAL_COMMUNITY): Payer: Medicaid Other | Admitting: Occupational Therapy

## 2012-01-26 ENCOUNTER — Encounter (HOSPITAL_COMMUNITY): Payer: Self-pay | Admitting: Occupational Therapy

## 2012-01-26 ENCOUNTER — Inpatient Hospital Stay (HOSPITAL_COMMUNITY): Payer: Medicaid Other

## 2012-01-26 ENCOUNTER — Inpatient Hospital Stay (HOSPITAL_COMMUNITY): Payer: Medicaid Other | Admitting: Speech Pathology

## 2012-01-26 MED ORDER — SODIUM CHLORIDE 0.9 % IV SOLN
INTRAVENOUS | Status: DC
  Administered 2012-01-26: 75 mL/h via INTRAVENOUS
  Administered 2012-01-27: 08:00:00 via INTRAVENOUS

## 2012-01-26 MED ORDER — IOHEXOL 300 MG/ML  SOLN
50.0000 mL | Freq: Once | INTRAMUSCULAR | Status: AC | PRN
Start: 2012-01-26 — End: 2012-01-26

## 2012-01-26 MED ORDER — FENTANYL CITRATE 0.05 MG/ML IJ SOLN
INTRAMUSCULAR | Status: DC | PRN
  Administered 2012-01-26: 50 ug via INTRAVENOUS

## 2012-01-26 MED ORDER — ONDANSETRON HCL 4 MG/2ML IJ SOLN
INTRAMUSCULAR | Status: DC | PRN
  Administered 2012-01-26: 4 mg via INTRAVENOUS

## 2012-01-26 MED ORDER — MIDAZOLAM HCL 2 MG/2ML IJ SOLN
INTRAMUSCULAR | Status: DC | PRN
  Administered 2012-01-26 (×2): 1 mg via INTRAVENOUS

## 2012-01-26 MED ORDER — OSMOLITE 1.5 CAL PO LIQD
1000.0000 mL | ORAL | Status: DC
  Administered 2012-01-27 – 2012-01-29 (×2): 1000 mL
  Filled 2012-01-26 (×7): qty 1000

## 2012-01-26 NOTE — Progress Notes (Signed)
Nursing Note: Pt tolerated barium [email or web address].wbb

## 2012-01-26 NOTE — Progress Notes (Signed)
Patient ID: Collin Diaz, male   DOB: 1956/03/21, 56 y.o.   MRN: [id number] HPI: Collin Diaz is a 56 y.o. 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 Has history of poor controlled BP and had not had meds filled recently. Admitted 12/30/2011 with weakness, slurring of speech with coughing/chocking and blood pressure 254/143. Patient placed on Cardene drip. Cranial CT and imaging revealed right thalamus and basal ganglia hemorrhage with intraventricular hemorrhage and cytotoxic cerebral edema with right to left midline shift. Evaluated by neurology services with tight blood pressure control with SBP<150. Started on 3% saline drip with goal of 155-160 Na to decrease cerebral edema. Noted dysphagia currently on a dysphagia 1 diet   Subjective/Complaints: Awake and alert.  Po intake overall improving but still at <30% of meals Fluids still<525ml/24hours Discussed PEG with pt Review of Systems  Unable to perform ROS: medical condition  Constitutional: Positive for malaise/fatigue.  Neurological: Positive for focal weakness.     Objective: Vital Signs: Blood pressure 97/68, pulse 87, temperature 98.9 F (37.2 C), temperature source Oral, resp. rate 18, height 5' 8.9" (1.75 m), weight 98.3 kg (216 lb 11.4 oz), SpO2 92.00%. No results found. Results for orders placed during the hospital encounter of 01/03/12 (from the past 72 hour(s))  CBC     Status: Abnormal   Collection Time   01/25/12 11:17 AM      Component Value Range Comment   WBC 5.7  4.0 - 10.5 K/uL    RBC 4.02 (*) 4.22 - 5.81 MIL/uL    Hemoglobin 12.0 (*) 13.0 - 17.0 g/dL    HCT 40.9 (*) 81.1 - 52.0 %    MCV 87.8  78.0 - 100.0 fL    MCH 29.9  26.0 - 34.0 pg    MCHC 34.0  30.0 - 36.0 g/dL    RDW 91.4  78.2 - 95.6 %    Platelets 176  150 - 400 K/uL   PROTIME-INR     Status: Abnormal   Collection  Time   01/25/12 11:17 AM      Component Value Range Comment   Prothrombin Time 15.4 (*) 11.6 - 15.2 seconds    INR 1.24  0.00 - 1.49   APTT     Status: Abnormal   Collection Time   01/25/12 11:17 AM      Component Value Range Comment   aPTT 38 (*) 24 - 37 seconds   BASIC METABOLIC PANEL     Status: Abnormal   Collection Time   01/25/12 11:17 AM      Component Value Range Comment   Sodium 138  135 - 145 mEq/L    Potassium 4.0  3.5 - 5.1 mEq/L    Chloride 100  96 - 112 mEq/L    CO2 23  19 - 32 mEq/L    Glucose, Bld 106 (*) 70 - 99 mg/dL    BUN 44 (*) 6 - 23 mg/dL    Creatinine, Ser 2.13 (*) 0.50 - 1.35 mg/dL    Calcium 08.6  8.4 - 10.5 mg/dL    GFR calc non Af Amer 25 (*) >90 mL/min    GFR calc Af Amer 29 (*) >90 mL/min      HEENT: normal Cardio: RRR Resp: CTA B/L GI: BS positive Extremity:  No Edema Skin:   Intact Neuro: Flat, Cranial Nerve II-XII normal, Abnormal Sensory reduced lt touch on  L, Abnormal Motor o/5 on L side, Abnormal FMC Tone  Hypotonia, Dysarthric and Aphasic Musc/Skel:  Swelling Left hand  Assessment/Plan: 1. Functional deficits secondary to R thalamic hemorrhage which require 3+ hours per day of interdisciplinary therapy in a comprehensive inpatient rehab setting. Physiatrist is providing close team supervision and 24 hour management of active medical problems listed below. Physiatrist and rehab team continue to assess barriers to discharge/monitor patient progress toward functional and medical goals. FIM: FIM - Bathing Bathing Steps Patient Completed: Chest;Abdomen;Left Arm;Right upper leg;Left upper leg Bathing: 1: Two helpers  FIM - Upper Body Dressing/Undressing Upper body dressing/undressing steps patient completed: Thread/unthread right sleeve of pullover shirt/dresss;Put head through opening of pull over shirt/dress Upper body dressing/undressing: 1: Total-Patient completed less than 25% of tasks FIM - Lower Body Dressing/Undressing Lower body  dressing/undressing steps patient completed: Pull pants up/down Lower body dressing/undressing: 1: Two helpers  FIM - Toileting Toileting: 1: Total-Patient completed zero steps, helper did all 3  FIM - Diplomatic Services operational officer Devices: Bedside commode Toilet Transfers: 1-To toilet/BSC: Total A (helper does all/Pt. < 25%);1-From toilet/BSC: Total A (helper does all/Pt. < 25%);1-Two helpers  FIM - Banker Devices: Bed rails;Sliding board Bed/Chair Transfer: 1: Supine > Sit: Total A (helper does all/Pt. < 25%);1: Bed > Chair or W/C: Total A (helper does all/Pt. < 25%);1: Two helpers  FIM - Locomotion: Wheelchair Distance: 20 Locomotion: Wheelchair: 1: Total Assistance/staff pushes wheelchair (Pt<25%) FIM - Locomotion: Ambulation Locomotion: Ambulation: 0: Activity did not occur  Comprehension Comprehension Mode: Auditory Comprehension: 4-Understands basic 75 - 89% of the time/requires cueing 10 - 24% of the time  Expression Expression Mode: Verbal Expression: 2-Expresses basic 25 - 49% of the time/requires cueing 50 - 75% of the time. Uses single words/gestures.  Social Interaction Social Interaction: 2-Interacts appropriately 25 - 49% of time - Needs frequent redirection.  Problem Solving Problem Solving: 2-Solves basic 25 - 49% of the time - needs direction more than half the time to initiate, plan or complete simple activities  Memory Memory: 3-Recognizes or recalls 50 - 74% of the time/requires cueing 25 - 49% of the time   Medical Problem List and Plan:  1. DVT Prophylaxis/Anticoagulation: Pharmaceutical: Lovenox  2. Pain Management: tylenol  3. Mood:  low dose celexa, abilify already initiated, but change to HS dosing. Apparently he had been using this as an outpt, but it's unclear how long, why , or if he had been compliant with usage.  4. Neuropsych: This patient is not capable of making decisions on his/her own  behalf Reduced intiation  Methylphenidate-d/c if recurrent seizure 5. Chronic renal failure was at baseline with IV hydration at night only but BUN and Creat have crept up on HCTZ +/- ACE I  6. HTN:  Restarted HCTZ but BUN and Creat increased ,will increase cardura ,D/C HCTZ cont  lisinopril-monitor and adjust 7. Dysphagia: D1, nectar thick liquids -poor intake due to cognition and dysphagia- should improve with CVA recovery over the next 1-2 months  8.  Post stroke seizure d/o keppra  LOS (Days) 23 A FACE TO FACE EVALUATION WAS PERFORMED  Korine Winton E 01/26/2012, 7:25 AM

## 2012-01-26 NOTE — Progress Notes (Signed)
Speech Language Pathology Daily Session Note  Patient Details  Name: Collin Diaz MRN: [id number] Date of Birth: 16-Nov-1955  Today's Date: 01/26/2012 Time: 1400-1425 Time Calculation (min): 25 min  Short Term Goals: Week 4: SLP Short Term Goal 1 (Week 4): Pt will utilize respiratory support sufficient to sustain phonation for 10 seconds with mod cues. SLP Short Term Goal 2 (Week 4): Patient will tolerate trials of Dys. 2 textures with max assist multimodal cuing for efficient mastication and timely anterior-posterior transit of bolus  SLP Short Term Goal 3 (Week 4): Pt will produce 3-5 word utterances with mod assist to increase intelligibility.  SLP Short Term Goal 4 (Week 4): Pt will demonstrate improved volitional cough as a compensatory strategy for reduced pharyngeal sensation and airway protection. SLP Short Term Goal 5 (Week 4): Pt will tolerate dysphagia 1 diet with nectar thick liquids with mod assist for precautions.  Skilled Therapeutic Interventions: Pt is currently NPO awaiting PEG placement. SLP facilitated the session with max verbal cues to initiate 2-3 word utterance responses to direct questions. Patient also required total A multimodal cueing to utilized increased vocal intensity at the word level. Pt was ~25% intelligible at the word level.    FIM:  Comprehension Comprehension Mode: Auditory Comprehension: 3-Understands basic 50 - 74% of the time/requires cueing 25 - 50%  of the time Expression Expression Mode: Verbal Expression: 2-Expresses basic 25 - 49% of the time/requires cueing 50 - 75% of the time. Uses single words/gestures. Social Interaction Social Interaction: 2-Interacts appropriately 25 - 49% of time - Needs frequent redirection. Problem Solving Problem Solving: 2-Solves basic 25 - 49% of the time - needs direction more than half the time to initiate, plan or complete simple activities Memory Memory: 2-Recognizes or recalls 25 - 49% of the  time/requires cueing 51 - 75% of the time  Pain Pain Assessment Pain Assessment: No/denies pain  Therapy/Group: Individual Therapy  Ruie Sendejo 01/26/2012, 3:27 PM

## 2012-01-26 NOTE — Progress Notes (Signed)
Patient's wife [name]) came in this AM and spoke with Dr. Doroteo Bradford about patient getting peg tube placement.  Patient is NPO since midnight. Wife at bedside signed the consent form, she verbalized understanding with no further questions.  Lovenox held per MD. CHG bath, pre-procedure checklist, and VS completed.  Patient went for the procedure at 1507.

## 2012-01-26 NOTE — Progress Notes (Addendum)
Nutrition Follow-up  Intervention:   1. Once PEG is placed and ready to use, initiate Osmolite 1.5 at 15 ml/hr, advance by 10 ml/hr every 12 hours (slow advancement 2/2 risk for refeeding syndrome) to goal of 65 ml/hr x 20 hours (anticipate need to hold TF x 4 hours daily for participation in therapies.) This regimen will provide: 1950 kcal, 82 grams protein, 991 ml free water. Noted pt planning to continue to eat and drink s/p PEG placement per PA; recommend adding/adjusting free water flushes based on PO intake of fluids. 2. Once ready to transition to bolus regimen, recommend bolus of 1 can (8 oz) of Osmolite 1.5 every 4 hours, or a total of 6 cans daily. This will provide: 2130 kcal, 89 grams protein, 1086 ml free water.  3. Monitor magnesium, potassium, and phosphorus daily for at least 3 days, MD to replete as needed, as pt is at risk for refeeding syndrome given ongoing poor PO intake and weight loss. 4. RD to continue to follow nutrition care plan  Assessment:   Per MD, pt to pursue PEG 2/2 pt will need 1-2 months until he can be upgraded to regular diet and fluids. Intake of current diet remains poor, ranging from 0 - 30%.  Pt is currently in IR for PEG placement. Discussed plans for feeding regimen with PA Delle Reining. Per her request, will initiate continuous feedings with plans to transition to bolus. Discussed refeeding syndrome risk with PA and asked for her to monitor potassium, magnesium, and phosphorus for at least 3 days 2/2 prolonged suboptimal PO intake.  Skin: skin remains intact Labs: reviewed Last BM: 10/9  Diet Order:  Dysphagia 1 diet, Nectar Thickened Liquids Supplements: Ensure Pudding PO TID  Meds: Scheduled Meds:    . ARIPiprazole  1 mg Oral QHS  .  ceFAZolin (ANCEF) IV  2 g Intravenous Once  . citalopram  10 mg Oral Daily  . doxazosin  4 mg Oral QHS  . feeding supplement  1 Container Oral TID BM  . hydrocerin   Topical BID  . levETIRAcetam  500 mg Oral BID    . lisinopril  5 mg Oral Daily  . methylphenidate  10 mg Oral BID WC  . DISCONTD: enoxaparin  40 mg Subcutaneous Daily   Continuous Infusions:   PRN Meds:.acetaminophen, alum & mag hydroxide-simeth, bisacodyl, cloNIDine, diphenhydrAMINE, food thickener, guaiFENesin-dextromethorphan, ondansetron (ZOFRAN) IV, prochlorperazine, prochlorperazine, prochlorperazine, RESOURCE THICKENUP CLEAR, senna-docusate, traMADol, traZODone  Labs:  CMP     Component Value Date/Time   NA 138 01/25/2012 1117   K 4.0 01/25/2012 1117   CL 100 01/25/2012 1117   CO2 23 01/25/2012 1117   GLUCOSE 106* 01/25/2012 1117   BUN 44* 01/25/2012 1117   CREATININE 2.72* 01/25/2012 1117   CALCIUM 10.1 01/25/2012 1117   PROT 6.9 01/04/2012 0606   ALBUMIN 3.2* 01/04/2012 0606   AST 23 01/04/2012 0606   ALT 29 01/04/2012 0606   ALKPHOS 90 01/04/2012 0606   BILITOT 0.5 01/04/2012 0606   GFRNONAA 25* 01/25/2012 1117   GFRAA 29* 01/25/2012 1117   Sodium  Date/Time Value Range Status  01/25/2012 11:17 AM 138  135 - 145 mEq/L Final  01/22/2012  6:05 AM 137  135 - 145 mEq/L Final  01/19/2012  6:10 AM 136  135 - 145 mEq/L Final    Potassium  Date/Time Value Range Status  01/25/2012 11:17 AM 4.0  3.5 - 5.1 mEq/L Final  01/22/2012  6:05 AM 4.0  3.5 - 5.1  mEq/L Final  01/19/2012  6:10 AM 3.8  3.5 - 5.1 mEq/L Final    Phosphorus  Date/Time Value Range Status  12/31/2011  5:50 AM 2.8  2.3 - 4.6 mg/dL Final    Magnesium  Date/Time Value Range Status  12/31/2011  5:50 AM 1.9  1.5 - 2.5 mg/dL Final   CBG (last 3)  No results found for this basename: GLUCAP:3 in the last 72 hours   Intake/Output Summary (Last 24 hours) at 01/26/12 1514 Last data filed at 01/26/12 1400  Gross per 24 hour  Intake    240 ml  Output    700 ml  Net   -460 ml   Weight Status:  216 lb - 10/2 Pt has lost a total of 14 lb since rehab admission.  Body mass index is 32.10 kg/(m^2). Obese Class I  Etimated needs:  1875 -2075 kcal, 80 -90 grams  protein  Nutrition Dx:  Inadequate oral intake r/t poor attention AEB poor meal completion. Ongoing.  Goal:  Pt to meet >/= 90% of their estimated nutrition needs - unmet  Monitor:  weight trends, lab trends, I/O's, PO intake, supplement tolerance, TF tolerance  Jarold Motto MS, [place], LDN Pager: [phone] After-hours pager: [phone]

## 2012-01-26 NOTE — Progress Notes (Signed)
Patient return from Peg tube placement at 1640.  Patient tolerated well per report.  VS within normal range.  Order for low wall suction x 24 hours. NS @75cc /hr until feeding tube start per Marissa Nestle, PA.  Will continue as plan of care.

## 2012-01-26 NOTE — Progress Notes (Signed)
Physical Therapy Session Note  Patient Details  Name: Collin Diaz MRN: [id number] Date of Birth: Mar 13, 1956  Today's Date: 01/26/2012 Time: 1000-1100 Time Calculation (min): 60 min  Short Term Goals: Week 3:  PT Short Term Goal 1 (Week 3): Patient will initiate 25% of the time for bed mobility training with use of UE support and max A PT Short Term Goal 1 - Progress (Week 3): Met PT Short Term Goal 2 (Week 3): Patient will transfer bed <> w/c with max A and initiate 25% of the time PT Short Term Goal 2 - Progress (Week 3): Partly met PT Short Term Goal 3 (Week 3): Patient will perform w/c mobility on unit x 50' with R hemi technique and 75% cues for initiation and sequencing  PT Short Term Goal 3 - Progress (Week 3): Partly met PT Short Term Goal 4 (Week 3): Patient will tolerate dynamic sitting balance and postural control training activities without back support x 15 minutes with mod-max A PT Short Term Goal 4 - Progress (Week 3): Met PT Short Term Goal 5 (Week 3): Patient will tolerate static standing in appropriate lift equipment for NMR to LLE, trunk control and pre-gait activities x 5 minutes  PT Short Term Goal 5 - Progress (Week 3): Partly met Week 4:  PT Short Term Goal 1 (Week 4): = LTG  Skilled Therapeutic Interventions/Progress Updates:    Patient reporting a need to have a BM; performed transfers w/c <> BSC with slideboard and pad under buttocks secondary to patient wearing gown for PEG placement procedure later with +2A and multiple verbal, tactile and visual cues for trunk lateral leans, rotation and hand placement to un-weight hips; once on BSC performed multiple lateral leans with max-total A to remove pad and brief and then to replace brief for transfer off of BSC.  Performed w/c > bed slideboard with total A to L and sit > supine on bed with total A; performed multiple rolls to L and R to reposition brief with max A for head, trunk flexion and full rotation with verbal  and visual cues for RUE to reach across midline and shoulder protraction to complete full roll.  Once back in supine patient reporting LBP with lateral and forward leans; patient with very limited ROM bilat hamstrings.  Performed PROM/prolonged stretching to bilat LE for increased hip and trunk ROM during transfers to minimize strain on low back.    Therapy Documentation Precautions:  Precautions Precautions: Fall Precaution Comments: Lt sided hemiparesis  Restrictions Weight Bearing Restrictions: No Pain: Pain Assessment Pain Assessment: No/denies pain   See FIM for current functional status  Therapy/Group: Individual Therapy  [name] 01/26/2012, 12:29 PM

## 2012-01-26 NOTE — Progress Notes (Signed)
Occupational Therapy Weekly Progress Note and Treatment Session Note  Patient Details  Name: Collin Diaz MRN: [id number] Date of Birth: March 06, 1956  Today's Date: 01/26/2012 Time: 0830-0930 Time Calculation (min): 60 min  Patient has met 0 of 5 short term goals.  Pt is continuing to make slow, fluctuating progress.  Patient is currently total assist for supine to sit, min assist static sitting, mod-max assist dynamic sitting, and max-total assist sit to stand.  Pt demonstrated increased attention and alertness, however continues to have difficulty with weight shifting to maintain sitting balance and to lean forward to pull up pants or spit in sink when completing oral hygiene, requiring max-total assist with weight shifting.  Pt's goals have been downgraded to mod-max assist overall for basic transfers and self-care tasks focus prior to D/C to SNF.  Patient continues to demonstrate the following deficits: Lt hemiplegia, increased tone, decreased arousal, impaired attention, initiation, sequencing, delayed processing, apraxia, impaired trunk and postural control and therefore will continue to benefit from skilled OT intervention to enhance overall performance with BADL and Reduce care partner burden.  Patient not progressing toward long term goals.  See goal revision..  Plan of care revisions: Downgraded goals to mod-max assist overall with self-care tasks and transfers..  OT Short Term Goals Week 3:  OT Short Term Goal 1 (Week 3): Pt will maintain static sitting for 5 mins with close supervision in preparation for selfcare tasks OT Short Term Goal 1 - Progress (Week 3): Progressing toward goal OT Short Term Goal 2 (Week 3): Pt will perform LB bathing sit to stand with Total +2 (pt 40%).  OT Short Term Goal 2 - Progress (Week 3): Progressing toward goal OT Short Term Goal 3 (Week 3): Pt will perfrom UB dressing in supportive sitting with min facilitation OT Short Term Goal 3 - Progress (Week  3): Progressing toward goal OT Short Term Goal 4 (Week 3): Pt will perform supine to sit with max assist in preparation for selfcare tasks  OT Short Term Goal 4 - Progress (Week 3): Progressing toward goal OT Short Term Goal 5 (Week 3): Pt will perform toilet transfers squat pivot with max assist. OT Short Term Goal 5 - Progress (Week 3): Progressing toward goal Week 4:  OT Short Term Goal 1 (Week 4): Pt will maintain static sitting for 5 mins with close supervision in preparation for selfcare tasks  OT Short Term Goal 2 (Week 4): Pt will perform LB bathing sit to stand with Total +2 (pt 40%).  OT Short Term Goal 3 (Week 4): Pt will perfrom UB dressing in supportive sitting with min facilitation  OT Short Term Goal 4 (Week 4): Pt will perform supine to sit with max assist in preparation for selfcare tasks  OT Short Term Goal 5 (Week 4): Pt will perform toilet transfers squat pivot with max assist  Skilled Therapeutic Interventions/Progress Updates:    Pt seen for ADL retraining with focus on bathing and dressing at EOB.  Pt with increased ability to complete static sitting balance at EOB, but is unable to maintain for 5 mins and upon distraction or increased challenge pt unable to maintain sitting balance.  Pt demonstrated frequent LOB to Lt and posteriorly in sitting EOB while completing UB and LB bathing.  Pt reports slight pain in lower back when bending forward to wash lower legs, pain goes away in supported sitting.  Total +2 slide board transfer to Rt to w/c and sit to stand for LB dressing.  Engaged in sit <> stand x2 with visual cue of mirror and sink to pull up on, continued total +2 assist.  Pt completed oral hygiene with setup assist and max assist weight shifting towards sink to spit.  Therapy Documentation Precautions:  Precautions Precautions: Fall Precaution Comments: Lt sided hemiparesis  Restrictions Weight Bearing Restrictions: No General:   Vital Signs: Therapy Vitals BP:  115/67 mmHg Pain: Pain Assessment Pain Assessment: No/denies pain ADL: ADL Grooming: Moderate assistance Where Assessed-Grooming: Sitting at sink;Wheelchair Upper Body Bathing: Minimal assistance Where Assessed-Upper Body Bathing: Edge of bed Lower Body Bathing: Maximal assistance Where Assessed-Lower Body Bathing: Edge of bed Upper Body Dressing: Maximal assistance Where Assessed-Upper Body Dressing: Edge of bed Lower Body Dressing: Dependent Where Assessed-Lower Body Dressing: Edge of bed Toileting: Dependent Where Assessed-Toileting: Bedside Commode Toilet Transfer: Dependent Toilet Transfer Method: Charity fundraiser: Bedside commode ADL Comments: Pt with increased alertness, initiation, and attention to task, able to initiate tasks with more consistency however has difficulty completing tasks secondary to ?attention/fatigue. Continued focus on trunk control as pt fluctuates from supervision-max sitting balance in a given session  See FIM for current functional status  Therapy/Group: Individual Therapy  Leonette Monarch 01/26/2012, 11:47 AM

## 2012-01-27 ENCOUNTER — Inpatient Hospital Stay (HOSPITAL_COMMUNITY): Payer: Medicaid Other | Admitting: *Deleted

## 2012-01-27 DIAGNOSIS — I619 Nontraumatic intracerebral hemorrhage, unspecified: Secondary | ICD-10-CM

## 2012-01-27 DIAGNOSIS — G8110 Spastic hemiplegia affecting unspecified side: Secondary | ICD-10-CM

## 2012-01-27 DIAGNOSIS — Z5189 Encounter for other specified aftercare: Secondary | ICD-10-CM

## 2012-01-27 MED ORDER — ACETAMINOPHEN 650 MG RE SUPP
650.0000 mg | Freq: Four times a day (QID) | RECTAL | Status: DC | PRN
  Administered 2012-01-27: 650 mg via RECTAL
  Filled 2012-01-27: qty 1

## 2012-01-27 MED ORDER — FREE WATER
30.0000 mL | Status: DC
  Administered 2012-01-27 – 2012-01-29 (×11): 30 mL

## 2012-01-27 NOTE — Progress Notes (Signed)
Subjective: Pt c/o pain at G-tube site Didn't rest well  Objective: Physical Exam: BP 141/92  Pulse 97  Temp 99.1 F (37.3 C) (Oral)  Resp 16  Ht 5' 8.9" (1.75 m)  Wt 212 lb 11.9 oz (96.5 kg)  BMI 31.51 kg/m2  SpO2 98% G-tube intact, site clean, no leakage, bleeding, hematoma.    Labs: CBC  Basename 01/25/12 1117  WBC 5.7  HGB 12.0*  HCT 35.3*  PLT 176   BMET  Basename 01/25/12 1117  NA 138  K 4.0  CL 100  CO2 23  GLUCOSE 106*  BUN 44*  CREATININE 2.72*  CALCIUM 10.1   LFT No results found for this basename: PROT,ALBUMIN,AST,ALT,ALKPHOS,BILITOT,BILIDIR,IBILI,LIPASE in the last 72 hours PT/INR  Basename 01/25/12 1117  LABPROT 15.4*  INR 1.24     Studies/Results: Ir Gastrostomy Tube Mod Sed  01/26/2012  *RADIOLOGY REPORT*  Clinical Data/Indication: STROKE.  PERC PLACEMENT GASTROSTOMY  Sedation: Versed 2.0 mg, Fentanyl 15 mcg.  Total Moderate Sedation Time: 34 minutes.  Fluoroscopy Time: 2.0 minutes.  Procedure: The procedure, risks, benefits, and alternatives were explained to the patient. Questions regarding the procedure were encouraged and answered. The patient understands and consents to the procedure.  The epigastrium was prepped with Betadine in a sterile fashion, and a sterile drape was applied covering the operative field. A sterile gown and sterile gloves were used for the procedure.  A 5-French orogastric tube is placed under fluoroscopic guidance. Scout imaging of the abdomen confirms barium within the transverse colon.  The stomach was distended with gas.  Under fluoroscopic guidance, an 18 gauge needle was utilized to puncture the anterior wall of the body of the stomach.   An Amplatz wire was advanced through the needle passing a T fastener into the lumen of the stomach.  The T fastener was secured for gastropexy.  A 9-French sheath was inserted.  A snare was advanced through the 9-French sheath.  A Teena Dunk was advanced through the orogastric tube.  It  was snared then pulled out the oral cavity, pulling the snare, as well. The leading edge of the gastrostomy was attached to the snare.  It was then pulled down the esophagus and out the percutaneous site.  It was secured in place.  Contrast was injected.  No complication.  Findings: The image demonstrates placement of a 20-French pull- through type gastrostomy tube into the body of the stomach.  IMPRESSION: Successful 20 French pull-through gastrostomy.   Original Report Authenticated By: Donavan Burnet, M.D.    Dg Abd Portable 1v  01/26/2012  *RADIOLOGY REPORT*  Clinical Data: Evaluate amount of barium prior to G-tube placement.  PORTABLE ABDOMEN - 1 VIEW  Comparison: None.  Findings: Mild to moderate amount of barium remains within the colon most notable right colon.  The possibility of free intraperitoneal air cannot be addressed on a supine view.  IMPRESSION: Mild to moderate amount of barium remains within the colon most notable right colon.   Original Report Authenticated By: Fuller Canada, M.D.     Assessment/Plan: S/p G-tube placement 10/11. OK for use including meds, TF per RD recs. Expect some soreness for a few days. Call with issues.    LOS: 24 days    Brayton El PA-C 01/27/2012 10:08 AM

## 2012-01-27 NOTE — Progress Notes (Signed)
Patient ID: Collin Diaz, male   DOB: [birth year], 56 y.o.   MRN: [id number] HPI: Collin Diaz is a 56 y.o. 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 Has history of poor controlled BP and had not had meds filled recently. Admitted 12/30/2011 with weakness, slurring of speech with coughing/chocking and blood pressure 254/143. Patient placed on Cardene drip. Cranial CT and imaging revealed right thalamus and basal ganglia hemorrhage with intraventricular hemorrhage and cytotoxic cerebral edema with right to left midline shift. Evaluated by neurology services with tight blood pressure control with SBP<150. Started on 3% saline drip with goal of 155-160 Na to decrease cerebral edema. Noted dysphagia currently on a dysphagia 1 diet   Subjective/Complaints: g tube placed, didn't sleep much due to pain  Review of Systems  Unable to perform ROS: medical condition  Constitutional: Positive for malaise/fatigue.  Neurological: Positive for focal weakness.     Objective: Vital Signs: Blood pressure 141/92, pulse 97, temperature 99.1 F (37.3 C), temperature source Oral, resp. rate 16, height 5' 8.9" (1.75 m), weight 96.5 kg (212 lb 11.9 oz), SpO2 98.00%. Ir Gastrostomy Tube Mod Sed  01/26/2012  *RADIOLOGY REPORT*  Clinical Data/Indication: STROKE.  PERC PLACEMENT GASTROSTOMY  Sedation: Versed 2.0 mg, Fentanyl 15 mcg.  Total Moderate Sedation Time: 34 minutes.  Fluoroscopy Time: 2.0 minutes.  Procedure: The procedure, risks, benefits, and alternatives were explained to the patient. Questions regarding the procedure were encouraged and answered. The patient understands and consents to the procedure.  The epigastrium was prepped with Betadine in a sterile fashion, and a sterile drape was applied covering the operative field. A sterile gown and sterile gloves were used for the  procedure.  A 5-French orogastric tube is placed under fluoroscopic guidance. Scout imaging of the abdomen confirms barium within the transverse colon.  The stomach was distended with gas.  Under fluoroscopic guidance, an 18 gauge needle was utilized to puncture the anterior wall of the body of the stomach.   An Amplatz wire was advanced through the needle passing a T fastener into the lumen of the stomach.  The T fastener was secured for gastropexy.  A 9-French sheath was inserted.  A snare was advanced through the 9-French sheath.  A Teena Dunk was advanced through the orogastric tube.  It was snared then pulled out the oral cavity, pulling the snare, as well. The leading edge of the gastrostomy was attached to the snare.  It was then pulled down the esophagus and out the percutaneous site.  It was secured in place.  Contrast was injected.  No complication.  Findings: The image demonstrates placement of a 20-French pull- through type gastrostomy tube into the body of the stomach.  IMPRESSION: Successful 20 French pull-through gastrostomy.   Original Report Authenticated By: Donavan Burnet, M.D.    Dg Abd Portable 1v  01/26/2012  *RADIOLOGY REPORT*  Clinical Data: Evaluate amount of barium prior to G-tube placement.  PORTABLE ABDOMEN - 1 VIEW  Comparison: None.  Findings: Mild to moderate amount of barium remains within the colon most notable right colon.  The possibility of free intraperitoneal air cannot be addressed on a supine view.  IMPRESSION: Mild to moderate amount of barium remains within the colon most notable right colon.   Original Report Authenticated By: Fuller Canada, M.D.    Results for orders placed during the hospital encounter of 01/03/12 (from the past  72 hour(s))  CBC     Status: Abnormal   Collection Time   01/25/12 11:17 AM      Component Value Range Comment   WBC 5.7  4.0 - 10.5 K/uL    RBC 4.02 (*) 4.22 - 5.81 MIL/uL    Hemoglobin 12.0 (*) 13.0 - 17.0 g/dL    HCT 96.0 (*) 45.4 -  52.0 %    MCV 87.8  78.0 - 100.0 fL    MCH 29.9  26.0 - 34.0 pg    MCHC 34.0  30.0 - 36.0 g/dL    RDW 09.8  11.9 - 14.7 %    Platelets 176  150 - 400 K/uL   PROTIME-INR     Status: Abnormal   Collection Time   01/25/12 11:17 AM      Component Value Range Comment   Prothrombin Time 15.4 (*) 11.6 - 15.2 seconds    INR 1.24  0.00 - 1.49   APTT     Status: Abnormal   Collection Time   01/25/12 11:17 AM      Component Value Range Comment   aPTT 38 (*) 24 - 37 seconds   BASIC METABOLIC PANEL     Status: Abnormal   Collection Time   01/25/12 11:17 AM      Component Value Range Comment   Sodium 138  135 - 145 mEq/L    Potassium 4.0  3.5 - 5.1 mEq/L    Chloride 100  96 - 112 mEq/L    CO2 23  19 - 32 mEq/L    Glucose, Bld 106 (*) 70 - 99 mg/dL    BUN 44 (*) 6 - 23 mg/dL    Creatinine, Ser 8.29 (*) 0.50 - 1.35 mg/dL    Calcium 56.2  8.4 - 10.5 mg/dL    GFR calc non Af Amer 25 (*) >90 mL/min    GFR calc Af Amer 29 (*) >90 mL/min      HEENT: normal Cardio: RRR Resp: CTA B/L GI: BS positive, abdomen tender near tube, site appears clean with appropriate drainage Extremity:  No Edema Skin:   Intact Neuro: Flat, Cranial Nerve II-XII normal, Abnormal Sensory reduced lt touch on L, Abnormal Motor o/5 on L side, Abnormal FMC Tone  Hypotonia, Dysarthric and Aphasic Musc/Skel:  Swelling Left hand  Assessment/Plan: 1. Functional deficits secondary to R thalamic hemorrhage which require 3+ hours per day of interdisciplinary therapy in a comprehensive inpatient rehab setting. Physiatrist is providing close team supervision and 24 hour management of active medical problems listed below. Physiatrist and rehab team continue to assess barriers to discharge/monitor patient progress toward functional and medical goals. FIM: FIM - Bathing Bathing Steps Patient Completed: Chest;Left Arm;Abdomen;Right upper leg;Left upper leg Bathing: 3: Mod-Patient completes 5-7 70f 10 parts or 50-74%  FIM - Upper  Body Dressing/Undressing Upper body dressing/undressing steps patient completed: Thread/unthread right sleeve of pullover shirt/dresss;Put head through opening of pull over shirt/dress Upper body dressing/undressing: 0: Wears gown/pajamas-no public clothing FIM - Lower Body Dressing/Undressing Lower body dressing/undressing steps patient completed: Pull pants up/down Lower body dressing/undressing: 0: Wears Oceanographer  FIM - Toileting Toileting: 1: Two helpers  FIM - Diplomatic Services operational officer Devices: Psychiatrist Transfers: 1-Two helpers  FIM - Banker Devices: Sliding board Bed/Chair Transfer: 1: Supine > Sit: Total A (helper does all/Pt. < 25%);1: Sit > Supine: Total A (helper does all/Pt. < 25%);1: Bed > Chair or W/C: Total A (  helper does all/Pt. < 25%);1: Chair or W/C > Bed: Total A (helper does all/Pt. < 25%)  FIM - Locomotion: Wheelchair Distance: 20 Locomotion: Wheelchair: 1: Total Assistance/staff pushes wheelchair (Pt<25%) FIM - Locomotion: Ambulation Locomotion: Ambulation: 0: Activity did not occur  Comprehension Comprehension Mode: Auditory Comprehension: 3-Understands basic 50 - 74% of the time/requires cueing 25 - 50%  of the time  Expression Expression Mode: Verbal Expression: 2-Expresses basic 25 - 49% of the time/requires cueing 50 - 75% of the time. Uses single words/gestures.  Social Interaction Social Interaction: 2-Interacts appropriately 25 - 49% of time - Needs frequent redirection.  Problem Solving Problem Solving: 2-Solves basic 25 - 49% of the time - needs direction more than half the time to initiate, plan or complete simple activities  Memory Memory: 2-Recognizes or recalls 25 - 49% of the time/requires cueing 51 - 75% of the time   Medical Problem List and Plan:  1. DVT Prophylaxis/Anticoagulation: Pharmaceutical: Lovenox  2. Pain Management: tylenol, can  use ultram for pain associated with g tube 3. Mood:  low dose celexa, abilify already initiated, but change to HS dosing. Apparently he had been using this as an outpt, but it's unclear how long, why , or if he had been compliant with usage.  4. Neuropsych: This patient is not capable of making decisions on his/her own behalf Reduced intiation  Methylphenidate-d/c if recurrent seizure 5. Chronic renal failure was at baseline with IV hydration at night only but BUN and Creat have crept up on HCTZ +/- ACE I  6. HTN:  Restarted HCTZ but BUN and Creat increased ,will increase cardura ,D/C HCTZ cont  lisinopril-monitor and adjust 7. Dysphagia: D1, nectar thick liquids -poor intake due to cognition and dysphagia- should improve with CVA recovery over the next 1-2 months  -G tube placed yesterday. Begin use today pending IR follow up  -site appears appropriate. He is having expected discomfort at site  8.  Post stroke seizure d/o keppra  LOS (Days) 24 A FACE TO FACE EVALUATION WAS PERFORMED  Mykala Mccready T 01/27/2012, 7:20 AM

## 2012-01-27 NOTE — Progress Notes (Signed)
Occupational Therapy Note  Patient Details  Name: Collin Diaz MRN: [id number] Date of Birth: Oct 16, 1955 Today's Date: 01/27/2012  Time:  1330-1415  (45 min) Pain:  2/10 abdominal pain.  Heat pack applied Individual therapy  Addressed  rolling, reaching, lying on side, rotational stretching, LUE scapula and trunk mobilization.    Pt. Attended to reaching activity for 15 - 20 seconds with max cues for sustained attention.  Positioned pt on right side at end of session.   Pt. Initiated 4 or 5 words during session with max cues to repeat so OT could hear him.  Humberto Seals 01/27/2012, 5:37 PM

## 2012-01-27 NOTE — Progress Notes (Signed)
Brief Nutrition Note:  Notified by RN that tube ready for use. Confirmed with RN that current TF order correct and can be started now that Radiology has given ok to use PEG. RN paged this RD again to initiate flushes.  Chart reviewed and noted that RD will follow po intake and recommend free water flushes based on intake. Will order minimal flushes to ensure patency of tube and clearance of medications if given via tube.  Kendell Bane RD, LDN, CNSC [phone] Weekend/After Hours Pager

## 2012-01-27 NOTE — Progress Notes (Addendum)
Patient tolerating tube feeding well this shift. Started tube feeding at 15cc/hr for 12 hours. Residual less than 5 cc q4h. Had 50% of meals for lunch. Denies abdominal discomfort after tylenol given.

## 2012-01-28 ENCOUNTER — Inpatient Hospital Stay (HOSPITAL_COMMUNITY): Payer: Medicaid Other | Admitting: *Deleted

## 2012-01-28 NOTE — Progress Notes (Signed)
Physical Therapy Note  Patient Details  Name: RACE LATOUR MRN: [id number] Date of Birth: 05-13-1955 Today's Date: 01/28/2012  1400-1455 (55 minutes) individual Pain : no reported pain Focus of treatment: bed mobility training; transfer training; therapeutic activities focused on sitting alignment/balance; wc mobility training Treatment: rolling to left using bed rail - max assist with tactile cues to use RT UE to assist on bedrail; LT side to sit max assist (pt 0%); sitting edge of bed mod assist; transfer with sliding board + pad +2 assist for safety (pt 10%); wc mobility- using Rt extremities, pt required constant tactile cues to minimally use RT UE/LE + max assist for propulsion and steering (120 feet)  reaching activities to right in sitting to facilitate weight shift to right; pt able to minimally resist trunk flexion and extension in sitting with tactile cues and minimally corrects loss of balance to left in sitting with vcs.    Shironda Kain,JIM 01/28/2012, 7:51 AM

## 2012-01-28 NOTE — Progress Notes (Signed)
Patient ID: Collin Diaz, male   DOB: [birth year], 56 y.o.   MRN: [id number] HPI: Collin Diaz is a 56 y.o. 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 Has history of poor controlled BP and had not had meds filled recently. Admitted 12/30/2011 with weakness, slurring of speech with coughing/chocking and blood pressure 254/143. Patient placed on Cardene drip. Cranial CT and imaging revealed right thalamus and basal ganglia hemorrhage with intraventricular hemorrhage and cytotoxic cerebral edema with right to left midline shift. Evaluated by neurology services with tight blood pressure control with SBP<150. Started on 3% saline drip with goal of 155-160 Na to decrease cerebral edema. Noted dysphagia currently on a dysphagia 1 diet   Subjective/Complaints: Belly a little sore, but overall improved.   Review of Systems  Unable to perform ROS: medical condition  Constitutional: Positive for malaise/fatigue.  Neurological: Positive for focal weakness.     Objective: Vital Signs: Blood pressure 112/81, pulse 95, temperature 98.7 F (37.1 C), temperature source Oral, resp. rate 20, height 5' 8.9" (1.75 m), weight 95.6 kg (210 lb 12.2 oz), SpO2 99.00%. Ir Gastrostomy Tube Mod Sed  01/26/2012  *RADIOLOGY REPORT*  Clinical Data/Indication: STROKE.  PERC PLACEMENT GASTROSTOMY  Sedation: Versed 2.0 mg, Fentanyl 15 mcg.  Total Moderate Sedation Time: 34 minutes.  Fluoroscopy Time: 2.0 minutes.  Procedure: The procedure, risks, benefits, and alternatives were explained to the patient. Questions regarding the procedure were encouraged and answered. The patient understands and consents to the procedure.  The epigastrium was prepped with Betadine in a sterile fashion, and a sterile drape was applied covering the operative field. A sterile gown and sterile gloves were used for the  procedure.  A 5-French orogastric tube is placed under fluoroscopic guidance. Scout imaging of the abdomen confirms barium within the transverse colon.  The stomach was distended with gas.  Under fluoroscopic guidance, an 18 gauge needle was utilized to puncture the anterior wall of the body of the stomach.   An Amplatz wire was advanced through the needle passing a T fastener into the lumen of the stomach.  The T fastener was secured for gastropexy.  A 9-French sheath was inserted.  A snare was advanced through the 9-French sheath.  A Teena Dunk was advanced through the orogastric tube.  It was snared then pulled out the oral cavity, pulling the snare, as well. The leading edge of the gastrostomy was attached to the snare.  It was then pulled down the esophagus and out the percutaneous site.  It was secured in place.  Contrast was injected.  No complication.  Findings: The image demonstrates placement of a 20-French pull- through type gastrostomy tube into the body of the stomach.  IMPRESSION: Successful 20 French pull-through gastrostomy.   Original Report Authenticated By: Donavan Burnet, M.D.    Results for orders placed during the hospital encounter of 01/03/12 (from the past 72 hour(s))  CBC     Status: Abnormal   Collection Time   01/25/12 11:17 AM      Component Value Range Comment   WBC 5.7  4.0 - 10.5 K/uL    RBC 4.02 (*) 4.22 - 5.81 MIL/uL    Hemoglobin 12.0 (*) 13.0 - 17.0 g/dL    HCT 40.9 (*) 81.1 - 52.0 %    MCV 87.8  78.0 - 100.0 fL    MCH 29.9  26.0 - 34.0 pg  MCHC 34.0  30.0 - 36.0 g/dL    RDW 16.1  09.6 - 04.5 %    Platelets 176  150 - 400 K/uL   PROTIME-INR     Status: Abnormal   Collection Time   01/25/12 11:17 AM      Component Value Range Comment   Prothrombin Time 15.4 (*) 11.6 - 15.2 seconds    INR 1.24  0.00 - 1.49   APTT     Status: Abnormal   Collection Time   01/25/12 11:17 AM      Component Value Range Comment   aPTT 38 (*) 24 - 37 seconds   BASIC METABOLIC PANEL      Status: Abnormal   Collection Time   01/25/12 11:17 AM      Component Value Range Comment   Sodium 138  135 - 145 mEq/L    Potassium 4.0  3.5 - 5.1 mEq/L    Chloride 100  96 - 112 mEq/L    CO2 23  19 - 32 mEq/L    Glucose, Bld 106 (*) 70 - 99 mg/dL    BUN 44 (*) 6 - 23 mg/dL    Creatinine, Ser 4.09 (*) 0.50 - 1.35 mg/dL    Calcium 81.1  8.4 - 10.5 mg/dL    GFR calc non Af Amer 25 (*) >90 mL/min    GFR calc Af Amer 29 (*) >90 mL/min      HEENT: normal Cardio: RRR Resp: CTA B/L GI: BS positive, abdomen tender near tube, site appears clean with appropriate drainage Extremity:  No Edema Skin:   Intact Neuro: Flat, Cranial Nerve II-XII normal, Abnormal Sensory reduced lt touch on L, Abnormal Motor o/5 on L side, sensation trace on left also. Abnormal FMC Tone  Hypotonia, Dysarthric and Aphasic Musc/Skel:  Swelling Left hand  Assessment/Plan: 1. Functional deficits secondary to R thalamic hemorrhage which require 3+ hours per day of interdisciplinary therapy in a comprehensive inpatient rehab setting. Physiatrist is providing close team supervision and 24 hour management of active medical problems listed below. Physiatrist and rehab team continue to assess barriers to discharge/monitor patient progress toward functional and medical goals. FIM: FIM - Bathing Bathing Steps Patient Completed: Chest;Left Arm;Abdomen;Right upper leg;Left upper leg Bathing: 1: Total-Patient completes 0-2 of 10 parts or less than 25%  FIM - Upper Body Dressing/Undressing Upper body dressing/undressing steps patient completed: Thread/unthread right sleeve of pullover shirt/dresss;Put head through opening of pull over shirt/dress Upper body dressing/undressing: 0: Wears gown/pajamas-no public clothing FIM - Lower Body Dressing/Undressing Lower body dressing/undressing steps patient completed: Pull pants up/down Lower body dressing/undressing: 0: Wears Oceanographer  FIM -  Toileting Toileting: 1: Two helpers  FIM - Diplomatic Services operational officer Devices: Psychiatrist Transfers: 1-Two helpers  FIM - Banker Devices: Sliding board Bed/Chair Transfer: 1: Supine > Sit: Total A (helper does all/Pt. < 25%);1: Sit > Supine: Total A (helper does all/Pt. < 25%);1: Bed > Chair or W/C: Total A (helper does all/Pt. < 25%);1: Chair or W/C > Bed: Total A (helper does all/Pt. < 25%)  FIM - Locomotion: Wheelchair Distance: 20 Locomotion: Wheelchair: 1: Total Assistance/staff pushes wheelchair (Pt<25%) FIM - Locomotion: Ambulation Locomotion: Ambulation: 0: Activity did not occur  Comprehension Comprehension Mode: Auditory Comprehension: 3-Understands basic 50 - 74% of the time/requires cueing 25 - 50%  of the time  Expression Expression Mode: Verbal Expression: 2-Expresses basic 25 - 49% of the time/requires cueing 50 - 75%  of the time. Uses single words/gestures.  Social Interaction Social Interaction: 2-Interacts appropriately 25 - 49% of time - Needs frequent redirection.  Problem Solving Problem Solving: 2-Solves basic 25 - 49% of the time - needs direction more than half the time to initiate, plan or complete simple activities  Memory Memory: 2-Recognizes or recalls 25 - 49% of the time/requires cueing 51 - 75% of the time   Medical Problem List and Plan:  1. DVT Prophylaxis/Anticoagulation: Pharmaceutical: Lovenox  2. Pain Management: tylenol, can use ultram for pain associated with g tube 3. Mood:  low dose celexa, abilify already initiated, but change to HS dosing. Apparently he had been using this as an outpt, but it's unclear how long, why , or if he had been compliant with usage.  4. Neuropsych: This patient is not capable of making decisions on his/her own behalf Reduced intiation  Methylphenidate-d/c if recurrent seizure 5. Chronic renal failure was at baseline with IV hydration at  night only but BUN and Creat have crept up on HCTZ +/- ACE I  6. HTN:  Restarted HCTZ but BUN and Creat increased ,will increase cardura ,D/C HCTZ cont  lisinopril-monitor and adjust 7. Dysphagia: D1, nectar thick liquids -poor intake due to cognition and dysphagia- should improve with CVA recovery over the next 1-2 months  -G tube placed yesterday.   -TF's initiated.  -ultram prn for pain.  -pain improved today  8.  Post stroke seizure d/o keppra  LOS (Days) 25 A FACE TO FACE EVALUATION WAS PERFORMED  SWARTZ,ZACHARY T 01/28/2012, 7:00 AM

## 2012-01-29 ENCOUNTER — Inpatient Hospital Stay (HOSPITAL_COMMUNITY): Payer: Medicaid Other | Admitting: Occupational Therapy

## 2012-01-29 ENCOUNTER — Inpatient Hospital Stay (HOSPITAL_COMMUNITY): Payer: Medicaid Other | Admitting: Physical Therapy

## 2012-01-29 ENCOUNTER — Inpatient Hospital Stay (HOSPITAL_COMMUNITY): Payer: Medicaid Other | Admitting: Speech Pathology

## 2012-01-29 LAB — PHOSPHORUS: Phosphorus: 3.3 mg/dL (ref 2.3–4.6)

## 2012-01-29 LAB — BASIC METABOLIC PANEL
CO2: 22 mEq/L (ref 19–32)
Calcium: 9.6 mg/dL (ref 8.4–10.5)
Chloride: 108 mEq/L (ref 96–112)
Sodium: 141 mEq/L (ref 135–145)

## 2012-01-29 LAB — MAGNESIUM: Magnesium: 2.7 mg/dL — ABNORMAL HIGH (ref 1.5–2.5)

## 2012-01-29 MED ORDER — OSMOLITE 1.5 CAL PO LIQD
1000.0000 mL | ORAL | Status: DC
  Administered 2012-01-29 – 2012-01-31 (×3): 1000 mL
  Filled 2012-01-29 (×4): qty 1000

## 2012-01-29 MED ORDER — OSMOLITE 1.5 CAL PO LIQD: 1000.0000 mL | ORAL | Status: DC

## 2012-01-29 MED ORDER — OSMOLITE 1.5 CAL PO LIQD
1000.0000 mL | ORAL | Status: DC
  Filled 2012-01-29 (×3): qty 1000

## 2012-01-29 MED ORDER — ENOXAPARIN SODIUM 40 MG/0.4ML ~~LOC~~ SOLN
40.0000 mg | SUBCUTANEOUS | Status: DC
  Administered 2012-01-30 – 2012-02-11 (×14): 40 mg via SUBCUTANEOUS
  Filled 2012-01-29 (×15): qty 0.4

## 2012-01-29 MED ORDER — OSMOLITE 1.5 CAL PO LIQD
1000.0000 mL | ORAL | Status: DC
  Filled 2012-01-29: qty 1000

## 2012-01-29 MED ORDER — FREE WATER
200.0000 mL | Freq: Four times a day (QID) | Status: DC
  Administered 2012-01-29 – 2012-02-12 (×56): 200 mL

## 2012-01-29 MED ORDER — OSMOLITE 1.5 CAL PO LIQD
1000.0000 mL | ORAL | Status: DC
  Filled 2012-01-29 (×2): qty 1000

## 2012-01-29 MED ORDER — FREE WATER
100.0000 mL | Freq: Three times a day (TID) | Status: DC
  Administered 2012-01-29: 100 mL

## 2012-01-29 NOTE — Progress Notes (Signed)
Speech Language Pathology Daily Session Note  Patient Details  Name: Collin Diaz MRN: [id number] Date of Birth: Jan 13, 1956  Today's Date: 01/29/2012 Time: 8413-2440 Time Calculation (min): 35 min  Short Term Goals: Week 4: SLP Short Term Goal 1 (Week 4): Pt will utilize respiratory support sufficient to sustain phonation for 10 seconds with mod cues. SLP Short Term Goal 2 (Week 4): Patient will tolerate trials of Dys. 2 textures with max assist multimodal cuing for efficient mastication and timely anterior-posterior transit of bolus  SLP Short Term Goal 3 (Week 4): Pt will produce 3-5 word utterances with mod assist to increase intelligibility.  SLP Short Term Goal 4 (Week 4): Pt will demonstrate improved volitional cough as a compensatory strategy for reduced pharyngeal sensation and airway protection. SLP Short Term Goal 5 (Week 4): Pt will tolerate dysphagia 1 diet with nectar thick liquids with mod assist for precautions.  Skilled Therapeutic Interventions: Treatment session addressed dysphagia goals and initiation of family education. SLP facilitated session with max assist encouragement for wife to assist patient with breakfast.  SLP demonstrated set up of environment, choice of two to facilitate verbal responses, direct cues to initiate set up of meal and increased wait time and mod assist verbal and tactile cues to initiate and attend to anterior to posterior oral transit and timely swallows while consuming Dys.1 textures and nectar-thick liquids via cup.  Patient exhibited a delayed cough x1, which indicates sensed penetration due to increased sip size.  SLP cued wife with mod faded to min assist verbal cues to cue patient to take small sips and go slow, as well as to check for oral residue.  Wife attempted to feed patient and SLP educated regarding rationale of positive cognitive impact self-feeding has on swallow function.  SLP recommends intermittent supervision when wife is feeding  patient; trials of Dys.2 textures tomorrow with wife present for further family education.     FIM:  Comprehension Comprehension Mode: Auditory Comprehension: 3-Understands basic 50 - 74% of the time/requires cueing 25 - 50%  of the time Expression Expression Mode: Verbal Expression: 2-Expresses basic 25 - 49% of the time/requires cueing 50 - 75% of the time. Uses single words/gestures. Social Interaction Social Interaction: 2-Interacts appropriately 25 - 49% of time - Needs frequent redirection. Problem Solving Problem Solving: 2-Solves basic 25 - 49% of the time - needs direction more than half the time to initiate, plan or complete simple activities Memory Memory: 3-Recognizes or recalls 50 - 74% of the time/requires cueing 25 - 49% of the time FIM - Eating Eating Activity: 4: Helper checks for pocketed food;4: Helper occasionally scoops food on utensil  Pain Pain Assessment Pain Assessment: No/denies pain Pain Score: 0-No pain  Therapy/Group: Individual Therapy  Charlane Ferretti., CCC-SLP [phone]  Collin Diaz 01/29/2012, 11:42 AM

## 2012-01-29 NOTE — Progress Notes (Addendum)
Occupational Therapy Session Note  Patient Details  Name: Collin Diaz MRN: [id number] Date of Birth: [birth year]  Today's Date: 01/29/2012 Time: 0930-1030 Time Calculation (min): 60 min  Short Term Goals: Week 4:  OT Short Term Goal 1 (Week 4): Pt will maintain static sitting for 5 mins with close supervision in preparation for selfcare tasks  OT Short Term Goal 2 (Week 4): Pt will perform LB bathing sit to stand with Total +2 (pt 40%).  OT Short Term Goal 3 (Week 4): Pt will perfrom UB dressing in supportive sitting with min facilitation  OT Short Term Goal 4 (Week 4): Pt will perform supine to sit with max assist in preparation for selfcare tasks  OT Short Term Goal 5 (Week 4): Pt will perform toilet transfers squat pivot with max assist  Skilled Therapeutic Interventions/Progress Updates:    Pt seen for ADL retraining with focus on bathing and dressing at EOB. Pt with increased ability to complete static sitting balance at EOB, but is unable to maintain when increased challenge is presented, such as participating in bathing. Pt demonstrated frequent LOB to Lt and posteriorly in sitting EOB while completing UB and LB bathing. Total +2 slide board transfer to Rt to w/c and sit to stand for LB dressing. Engaged in sit <> stand multiple times for LB bathing and dressing.  Pt demonstrated increased initiation and sustained attention to standing task with +2 to maintain standing and complete clothing management.  Pt benefits from use of visual cue of mirror and sink to pull up on, continued total +2 assist. Pt's wife present for family education, asked wife about hospital bed and d/c plans with pt's wife giving blank stare in return.  Encouraged wife to have hospital bed secondary to increased difficulty with bed mobility and pt will continue to need +2 assist if not +3 secondary to severe deficits.  Had pt's wife observe this session and plan to have her participate with hands on during next  session.  Pt's wife observed about half of session, falling asleep towards end of session.  Pt's wife may benefit from education and training on hoyer lift transfers.   Therapy Documentation Precautions:  Precautions Precautions: Fall Precaution Comments: Lt sided hemiparesis  Restrictions Weight Bearing Restrictions: No Pain: Pain Assessment Pain Assessment: No/denies pain Pain Score: 0-No pain Patients Stated Pain Goal: 3 Multiple Pain Sites: No  See FIM for current functional status  Therapy/Group: Individual Therapy  Leonette Monarch 01/29/2012, 12:11 PM

## 2012-01-29 NOTE — Progress Notes (Signed)
Physical Therapy Note  Patient Details  Name: Collin Diaz MRN: [id number] Date of Birth: 04/03/1956 Today's Date: 01/29/2012  1355- 1450 (55 minutes) individual Pain: pt points to PEG site/ premedicated Focus to treatment: transfer training; therapeutic activities focused on trunk control in sitting /standing; sitting balance (static/dynamic) Treatment: Transfer - sliding board + 2 for safety (pt 10%) wc > mat; sitting edge of mat min assist with slowly improving midline alignment; reaching activities forward, upward , right and left using RT UE; sitting with bilateral UEs on ball - pt able to push ball forward and backward for trunk control strengthening; standing in standing frame with pt hitting beach ball with mod assist RT UE.   [name],JIM 01/29/2012, 8:07 AM

## 2012-01-29 NOTE — Progress Notes (Signed)
Social Work Patient ID: Collin Diaz, male   DOB: [birth year], 56 y.o.   MRN: [id number] Met with pt and wife, wife reports she wants to take pt home when discharged.  Discussed team recommendations but she wants to try. Discussed she will need to begin hands on care and participating in his therapies.  Asked that she be here and present in his therapies To do hands on care.  Made team aware of the plan and will look toward MD regarding discharge date.  Check back with pt and wife later today.

## 2012-01-29 NOTE — Progress Notes (Signed)
Nutrition Follow-up  Intervention:   1. Increase free water flushes to 200 ml QID to better meet fluid needs 2. Once ready to transition to bolus regimen, recommend bolus of 1 can (8 oz) of Osmolite 1.5 QID, or a total of 4 cans daily. This will provide: 1420 kcal, 60 grams protein, 724 ml free water. This will meet 75% of estimated kcal and protein needs. 3. RD to continue to follow nutrition care plan  Assessment:   Per RN, pt had been advanced to Osmolite 1.5 at 45 cc/hr as of this morning. Pt had not yet made it to goal regimen of 65 cc/hr. Pt ordered for nocturnal regimen this morning, to start this evening. Discussed this with RN and PA Delle Reining.  Noted that pt is eating 60 - 80% of  Dys 1 diet.   Skin: skin remains intact Labs: reviewed Last BM: 10/13  Diet Order:  Dysphagia 1 diet, Nectar Thickened Liquids Supplements: Ensure Pudding PO TID TF: Osmolite 1.5 at 65 ml/hr x 12 hours - 1170 kcal, 49 grams protein, 594 ml free water; this meets 62% kcal and 61% protein needs  Free water 100 ml TID - 300 ml water daily total; provides an additional 894 ml free water with TF  Meds: Scheduled Meds:    . ARIPiprazole  1 mg Oral QHS  . citalopram  10 mg Oral Daily  . doxazosin  4 mg Oral QHS  . enoxaparin (LOVENOX) injection  40 mg Subcutaneous Q24H  . feeding supplement  1 Container Oral TID BM  . feeding supplement (OSMOLITE 1.5 CAL)  1,000 mL Per Tube Q24H  . free water  100 mL Per Tube TID WC & HS  . hydrocerin   Topical BID  . levETIRAcetam  500 mg Oral BID  . lisinopril  5 mg Oral Daily  . methylphenidate  10 mg Oral BID WC  . DISCONTD: feeding supplement (OSMOLITE 1.5 CAL)  1,000 mL Per Tube Q24H  . DISCONTD: free water  30 mL Per Tube Q4H   Continuous Infusions:    . DISCONTD: feeding supplement (OSMOLITE 1.5 CAL) 1,000 mL (01/29/12 0500)  . DISCONTD: feeding supplement (OSMOLITE 1.5 CAL)    . DISCONTD: feeding supplement (OSMOLITE 1.5 CAL)    . DISCONTD: feeding  supplement (OSMOLITE 1.5 CAL)     PRN Meds:.acetaminophen, acetaminophen, bisacodyl, cloNIDine, food thickener, ondansetron (ZOFRAN) IV, ondansetron, prochlorperazine, prochlorperazine, prochlorperazine, RESOURCE THICKENUP CLEAR, senna-docusate, traMADol, traZODone, DISCONTD: alum & mag hydroxide-simeth, DISCONTD: diphenhydrAMINE, DISCONTD: fentaNYL, DISCONTD: guaiFENesin-dextromethorphan, DISCONTD: midazolam  Labs:  CMP     Component Value Date/Time   NA 141 01/29/2012 0618   K 4.8 01/29/2012 0618   CL 108 01/29/2012 0618   CO2 22 01/29/2012 0618   GLUCOSE 163* 01/29/2012 0618   BUN 39* 01/29/2012 0618   CREATININE 2.02* 01/29/2012 0618   CALCIUM 9.6 01/29/2012 0618   PROT 6.9 01/04/2012 0606   ALBUMIN 3.2* 01/04/2012 0606   AST 23 01/04/2012 0606   ALT 29 01/04/2012 0606   ALKPHOS 90 01/04/2012 0606   BILITOT 0.5 01/04/2012 0606   GFRNONAA 35* 01/29/2012 0618   GFRAA 41* 01/29/2012 0618   Sodium  Date/Time Value Range Status  01/29/2012  6:18 AM 141  135 - 145 mEq/L Final  01/25/2012 11:17 AM 138  135 - 145 mEq/L Final  01/22/2012  6:05 AM 137  135 - 145 mEq/L Final    Potassium  Date/Time Value Range Status  01/29/2012  6:18 AM 4.8  3.5 -  5.1 mEq/L Final  01/25/2012 11:17 AM 4.0  3.5 - 5.1 mEq/L Final  01/22/2012  6:05 AM 4.0  3.5 - 5.1 mEq/L Final    Phosphorus  Date/Time Value Range Status  01/29/2012  6:18 AM 3.3  2.3 - 4.6 mg/dL Final  1/61/0960  4:54 AM 2.8  2.3 - 4.6 mg/dL Final    Magnesium  Date/Time Value Range Status  01/29/2012  6:18 AM 2.7* 1.5 - 2.5 mg/dL Final  0/98/1191  4:78 AM 1.9  1.5 - 2.5 mg/dL Final   CBG (last 3)  No results found for this basename: GLUCAP:3 in the last 72 hours   Intake/Output Summary (Last 24 hours) at 01/29/12 1032 Last data filed at 01/29/12 0521  Gross per 24 hour  Intake   1160 ml  Output   1450 ml  Net   -290 ml   Weight Status:  214 lb - 10/2 Pt has lost a total of 16 lb since rehab admission.  Body mass index is  31.71 kg/(m^2). Obese Class I  Etimated needs:  1875-2075 kcal, 80 -90 grams protein  Nutrition Dx:  Inadequate oral intake r/t poor attention now AEB poor meal completion and need for PEG.  Goal:  Pt to meet >/= 90% of their estimated nutrition needs -met  Monitor:  weight trends, lab trends, I/O's, PO intake, supplement tolerance, TF tolerance  Jarold Motto MS, [place], LDN Pager: [phone] After-hours pager: [phone]

## 2012-01-29 NOTE — Plan of Care (Signed)
Problem: RH BLADDER ELIMINATION Goal: RH STG MANAGE BLADDER WITH ASSISTANCE STG Manage Bladder With max assist timed toileting  Outcome: Not Progressing Pt has a foley  Problem: RH SKIN INTEGRITY Goal: RH STG SKIN FREE OF INFECTION/BREAKDOWN Min assist  Outcome: Not Progressing Total care

## 2012-01-29 NOTE — Plan of Care (Signed)
Problem: RH BLADDER ELIMINATION Goal: RH STG MANAGE BLADDER WITH ASSISTANCE STG Manage Bladder With max assist timed toileting  Outcome: Not Progressing Pt with foley cath to straight drain, total assist of staff

## 2012-01-29 NOTE — Plan of Care (Signed)
Problem: RH SKIN INTEGRITY Goal: RH STG SKIN FREE OF INFECTION/BREAKDOWN Max assist of caregiver Outcome: Not Progressing Total assist of caregiver, no initation

## 2012-01-29 NOTE — Progress Notes (Signed)
Patient ID: Collin Diaz, male   DOB: 1955/08/21, 56 y.o.   MRN: [id number] HPI: Collin Diaz is a 56 y.o. 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 Has history of poor controlled BP and had not had meds filled recently. Admitted 12/30/2011 with weakness, slurring of speech with coughing/chocking and blood pressure 254/143. Patient placed on Cardene drip. Cranial CT and imaging revealed right thalamus and basal ganglia hemorrhage with intraventricular hemorrhage and cytotoxic cerebral edema with right to left midline shift. Evaluated by neurology services with tight blood pressure control with SBP<150. Started on 3% saline drip with goal of 155-160 Na to decrease cerebral edema. Noted dysphagia currently on a dysphagia 1 diet   Subjective/Complaints: Spoke with wife who has been here over weekend .  Pt ate 60-80% meals yesterday.  About 800 ml fluid. But only took day before Continuous TF at 45 cc /hr  Review of Systems  Unable to perform ROS: medical condition  Constitutional: Positive for malaise/fatigue.  Neurological: Positive for focal weakness.     Objective: Vital Signs: Blood pressure 127/80, pulse 86, temperature 98.7 F (37.1 C), temperature source Oral, resp. rate 18, height 5' 8.9" (1.75 m), weight 97.1 kg (214 lb 1.1 oz), SpO2 95.00%. No results found. No results found for this or any previous visit (from the past 72 hour(s)).   HEENT: normal Cardio: RRR Resp: CTA B/L GI: BS positive, abdomen tender near tube, site appears clean with appropriate drainage Extremity:  No Edema Skin:   Intact Neuro: Flat, Cranial Nerve II-XII normal, Abnormal Sensory reduced lt touch on L, Abnormal Motor o/5 on L side, sensation trace on left also. Abnormal FMC Tone  Hypotonia, Dysarthric and Aphasic Musc/Skel:  Swelling Left  hand  Assessment/Plan: 1. Functional deficits secondary to R thalamic hemorrhage which require 3+ hours per day of interdisciplinary therapy in a comprehensive inpatient rehab setting. Physiatrist is providing close team supervision and 24 hour management of active medical problems listed below. Physiatrist and rehab team continue to assess barriers to discharge/monitor patient progress toward functional and medical goals. FIM: FIM - Bathing Bathing Steps Patient Completed: Chest;Left Arm;Abdomen;Right upper leg;Left upper leg Bathing: 1: Total-Patient completes 0-2 of 10 parts or less than 25%  FIM - Upper Body Dressing/Undressing Upper body dressing/undressing steps patient completed: Thread/unthread right sleeve of pullover shirt/dresss;Put head through opening of pull over shirt/dress Upper body dressing/undressing: 0: Wears gown/pajamas-no public clothing FIM - Lower Body Dressing/Undressing Lower body dressing/undressing steps patient completed: Pull pants up/down Lower body dressing/undressing: 0: Wears Oceanographer  FIM - Toileting Toileting: 1: Two helpers  FIM - Diplomatic Services operational officer Devices: Psychiatrist Transfers: 1-Two helpers  FIM - Banker Devices: Sliding board Bed/Chair Transfer: 1: Supine > Sit: Total A (helper does all/Pt. < 25%)  FIM - Locomotion: Wheelchair Distance: 20 Locomotion: Wheelchair: 1: Total Assistance/staff pushes wheelchair (Pt<25%) FIM - Locomotion: Ambulation Locomotion: Ambulation: 0: Activity did not occur  Comprehension Comprehension Mode: Auditory Comprehension: 3-Understands basic 50 - 74% of the time/requires cueing 25 - 50%  of the time  Expression Expression Mode: Verbal Expression: 2-Expresses basic 25 - 49% of the time/requires cueing 50 - 75% of the time. Uses single words/gestures.  Social Interaction Social Interaction: 2-Interacts  appropriately 25 - 49% of time - Needs frequent redirection.  Problem Solving Problem Solving: 2-Solves basic 25 - 49%  of the time - needs direction more than half the time to initiate, plan or complete simple activities  Memory Memory: 2-Recognizes or recalls 25 - 49% of the time/requires cueing 51 - 75% of the time   Medical Problem List and Plan:  1. DVT Prophylaxis/Anticoagulation: Pharmaceutical: Lovenox  2. Pain Management: tylenol, can use ultram for pain associated with g tube 3. Mood:  low dose celexa, abilify already initiated, but change to HS dosing. Apparently he had been using this as an outpt, but it's unclear how long, why , or if he had been compliant with usage.  4. Neuropsych: This patient is not capable of making decisions on his/her own behalf Reduced intiation  Methylphenidate-d/c if recurrent seizure 5. Chronic renal failure was at baseline with IV hydration at night only but BUN and Creat have crept up on HCTZ +/- ACE I  6. HTN:  Restarted HCTZ but BUN and Creat increased ,will increase cardura ,D/C HCTZ cont  lisinopril-monitor and adjust 7. Dysphagia: D1, nectar thick liquids -poor intake due to cognition and dysphagia- should improve with CVA recovery over the next 1-2 months  -G tube placed 10/11  -TF's initiated.Run 12hr/day  -ultram prn for pain.  -pain improved today  8.  Post stroke seizure d/o keppra  LOS (Days) 26 A FACE TO FACE EVALUATION WAS PERFORMED  Collin Diaz 01/29/2012, 7:03 AM

## 2012-01-29 NOTE — Progress Notes (Signed)
Social Work Patient ID: Collin Diaz, male   DOB: 1955-06-12, 56 y.o.   MRN: [id number] Met with pt and wife to see how their day went.  Wife reports she realizes it is a lot of care, but she will have help once home. Discussed she would need to be here all week to do hands on care in preparation for discharge.  Will discuss with MD and team discharge date. Wife reports she is going home tonight and will be back Wednesday.  This worker feels wife doesn't not realize the amount of care pt requires and when she Is here she doesn't help the staff but lays in the recliner chair watching or sleeping.  Discussed again NHP and when asked pt, pt shakes his head no.  Wife asked If he wanted to go home and he shook his head yes.  Continue to work with wife on a realistic and safe discharge plan.

## 2012-01-30 ENCOUNTER — Inpatient Hospital Stay (HOSPITAL_COMMUNITY): Payer: Medicaid Other | Admitting: *Deleted

## 2012-01-30 ENCOUNTER — Inpatient Hospital Stay (HOSPITAL_COMMUNITY): Payer: Medicaid Other | Admitting: Speech Pathology

## 2012-01-30 ENCOUNTER — Inpatient Hospital Stay (HOSPITAL_COMMUNITY): Payer: Medicaid Other | Admitting: Occupational Therapy

## 2012-01-30 DIAGNOSIS — I619 Nontraumatic intracerebral hemorrhage, unspecified: Secondary | ICD-10-CM

## 2012-01-30 DIAGNOSIS — Z5189 Encounter for other specified aftercare: Secondary | ICD-10-CM

## 2012-01-30 DIAGNOSIS — R209 Unspecified disturbances of skin sensation: Secondary | ICD-10-CM

## 2012-01-30 DIAGNOSIS — G8110 Spastic hemiplegia affecting unspecified side: Secondary | ICD-10-CM

## 2012-01-30 DIAGNOSIS — I69998 Other sequelae following unspecified cerebrovascular disease: Secondary | ICD-10-CM

## 2012-01-30 NOTE — Progress Notes (Signed)
Patient ID: Collin Diaz, male   DOB: [birth year], 56 y.o.   MRN: [id number] HPI: Collin Diaz is a 56 y.o. 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 Has history of poor controlled BP and had not had meds filled recently. Admitted 12/30/2011 with weakness, slurring of speech with coughing/chocking and blood pressure 254/143. Patient placed on Cardene drip. Cranial CT and imaging revealed right thalamus and basal ganglia hemorrhage with intraventricular hemorrhage and cytotoxic cerebral edema with right to left midline shift. Evaluated by neurology services with tight blood pressure control with SBP<150. Started on 3% saline drip with goal of 155-160 Na to decrease cerebral edema. Noted dysphagia currently on a dysphagia 1 diet   Subjective/Complaints: Spoke with wife who has been here over weekend .  Pt ate 25-50% meals yesterday.  About fluid 12hour TF at 55 cc /hr  Review of Systems  Unable to perform ROS: medical condition  Constitutional: Positive for malaise/fatigue.  Neurological: Positive for focal weakness.     Objective: Vital Signs: Blood pressure 115/81, pulse 85, temperature 98.4 F (36.9 C), temperature source Oral, resp. rate 18, height 5' 8.9" (1.75 m), weight 101.4 kg (223 lb 8.7 oz), SpO2 93.00%. No results found. Results for orders placed during the hospital encounter of 01/03/12 (from the past 72 hour(s))  BASIC METABOLIC PANEL     Status: Abnormal   Collection Time   01/29/12  6:18 AM      Component Value Range Comment   Sodium 141  135 - 145 mEq/L    Potassium 4.8  3.5 - 5.1 mEq/L    Chloride 108  96 - 112 mEq/L    CO2 22  19 - 32 mEq/L    Glucose, Bld 163 (*) 70 - 99 mg/dL    BUN 39 (*) 6 - 23 mg/dL    Creatinine, Ser 4.09 (*) 0.50 - 1.35 mg/dL    Calcium 9.6  8.4 - 81.1 mg/dL    GFR calc non Af Amer 35 (*) >90 mL/min      GFR calc Af Amer 41 (*) >90 mL/min   PHOSPHORUS     Status: Normal   Collection Time   01/29/12  6:18 AM      Component Value Range Comment   Phosphorus 3.3  2.3 - 4.6 mg/dL   MAGNESIUM     Status: Abnormal   Collection Time   01/29/12  6:18 AM      Component Value Range Comment   Magnesium 2.7 (*) 1.5 - 2.5 mg/dL      HEENT: normal Cardio: RRR Resp: CTA B/L GI: BS positive, abdomen tender near tube, site appears clean with appropriate drainage Extremity:  No Edema Skin:   Intact Neuro: Flat, Cranial Nerve II-XII normal, Abnormal Sensory reduced lt touch on L, Abnormal Motor o/5 on L side, sensation trace on left also. Abnormal FMC Tone  Hypotonia, Dysarthric and Aphasic Musc/Skel:  Swelling Left hand  Assessment/Plan: 1. Functional deficits secondary to R thalamic hemorrhage which require 3+ hours per day of interdisciplinary therapy in a comprehensive inpatient rehab setting. Physiatrist is providing close team supervision and 24 hour management of active medical problems listed below. Physiatrist and rehab team continue to assess barriers to discharge/monitor patient progress toward functional and medical goals. FIM: FIM - Bathing Bathing Steps Patient Completed: Chest;Left Arm;Abdomen;Right upper leg;Left upper leg Bathing: 3: Mod-Patient completes 5-7 46f 10 parts  or 50-74%  FIM - Upper Body Dressing/Undressing Upper body dressing/undressing steps patient completed: Thread/unthread right sleeve of pullover shirt/dresss;Put head through opening of pull over shirt/dress Upper body dressing/undressing: 2: Max-Patient completed 25-49% of tasks FIM - Lower Body Dressing/Undressing Lower body dressing/undressing steps patient completed: Pull pants up/down Lower body dressing/undressing: 1: Two helpers  FIM - Toileting Toileting: 1: Two helpers  FIM - Diplomatic Services operational officer Devices: Psychiatrist Transfers: 1-Two helpers  FIM - Physiological scientist Devices: Sliding board;Bed rails;Arm rests Bed/Chair Transfer: 1: Supine > Sit: Total A (helper does all/Pt. < 25%);1: Bed > Chair or W/C: Total A (helper does all/Pt. < 25%);1: Two helpers  FIM - Locomotion: Wheelchair Distance: 20 Locomotion: Wheelchair: 1: Total Assistance/staff pushes wheelchair (Pt<25%) FIM - Locomotion: Ambulation Locomotion: Ambulation: 0: Activity did not occur  Comprehension Comprehension Mode: Auditory Comprehension: 3-Understands basic 50 - 74% of the time/requires cueing 25 - 50%  of the time  Expression Expression Mode: Verbal Expression: 2-Expresses basic 25 - 49% of the time/requires cueing 50 - 75% of the time. Uses single words/gestures.  Social Interaction Social Interaction: 2-Interacts appropriately 25 - 49% of time - Needs frequent redirection.  Problem Solving Problem Solving: 2-Solves basic 25 - 49% of the time - needs direction more than half the time to initiate, plan or complete simple activities  Memory Memory: 3-Recognizes or recalls 50 - 74% of the time/requires cueing 25 - 49% of the time   Medical Problem List and Plan:  1. DVT Prophylaxis/Anticoagulation: Pharmaceutical: Lovenox  2. Pain Management: tylenol, can use ultram for pain associated with g tube 3. Mood:  low dose celexa, abilify already initiated, but change to HS dosing. Apparently he had been using this as an outpt, but it's unclear how long, why , or if he had been compliant with usage.  4. Neuropsych: This patient is not capable of making decisions on his/her own behalf Reduced intiation  Methylphenidate-d/c if recurrent seizure 5. Chronic renal failure now at baseline with po, G tube fluids       6. HTN: controlled 7. Dysphagia: D1, nectar thick liquids -poor intake due to cognition and dysphagia- should improve with CVA recovery over the next 1-2 months  -G tube placed 10/11  -TF's initiated.Run 12hr/day  -ultram prn for  pain.  -pain improved today  8.  Post stroke seizure d/o keppra  LOS (Days) 27 A FACE TO FACE EVALUATION WAS PERFORMED  Tiyon Sanor E 01/30/2012, 7:18 AM

## 2012-01-30 NOTE — Progress Notes (Signed)
Physical Therapy Session Note  Patient Details  Name: Collin Diaz MRN: [id number] Date of Birth: Aug 14, 1955  Today's Date: 01/30/2012 Time: 1400-1500 Time Calculation (min): 60 min  Short Term Goals: Week 3:  PT Short Term Goal 1 (Week 3): Patient will initiate 25% of the time for bed mobility training with use of UE support and max A PT Short Term Goal 1 - Progress (Week 3): Met PT Short Term Goal 2 (Week 3): Patient will transfer bed <> w/c with max A and initiate 25% of the time PT Short Term Goal 2 - Progress (Week 3): Partly met PT Short Term Goal 3 (Week 3): Patient will perform w/c mobility on unit x 50' with R hemi technique and 75% cues for initiation and sequencing  PT Short Term Goal 3 - Progress (Week 3): Partly met PT Short Term Goal 4 (Week 3): Patient will tolerate dynamic sitting balance and postural control training activities without back support x 15 minutes with mod-max A PT Short Term Goal 4 - Progress (Week 3): Met PT Short Term Goal 5 (Week 3): Patient will tolerate static standing in appropriate lift equipment for NMR to LLE, trunk control and pre-gait activities x 5 minutes  PT Short Term Goal 5 - Progress (Week 3): Partly met     Therapy Documentation Precautions:  Precautions Precautions: Fall Precaution Comments: Lt sided hemiparesis  Restrictions Weight Bearing Restrictions: No   Pain: Pain Assessment Pain Assessment: No/denies pain  Other Treatments:   Patient was sitting up in w/c upon arrival of PT. Patient's wife was lying in recliner. PT invited patient's wife to join session and assist with therapy session in PT gym. Wife's response was "okay." She did not come to gym during therapy session.  Patient was taken to PT gym in w/c by PT. Patient performed standing reach activities using [employer]. Patient able to verbalize colors of horseshoes, as well as pick the correct color when instructed to grab a specific color. Patient stood for ~12 min  prior to needing sitting rest break. Patient stood again for ~10 min while performing reaching for horseshoes and placing on target to the R. Patient required max manual facilitation at chest and hips for erect posture. Patient presented with less difficulty reaching with RUE with corrected posture.  Patient continues to present with posterior pelvic tilt in sitting secondary to low back and hamstring tightness. PT performed passive hamstring stretch x 30 sec, x 2 sets bilaterally to improve flexibility. Lumbar towel roll was placed in patient's w/c to help facilitate anterior pelvic positioning during sitting. Stretching followed by having patient actively push with RUE against therapist's shoulder to facilitate forward lean and anterior pelvic tilt x 10 reps. Patient able to perform motion without assist on last reps. Will continue to work on to assist with transfers.   See FIM for current functional status  Therapy/Group: Individual Therapy  Keagan Brislin Hamilton DPT Student  01/30/2012, 4:05 PM

## 2012-01-30 NOTE — Progress Notes (Signed)
Occupational Therapy Session Note  Patient Details  Name: Collin Diaz MRN: [id number] Date of Birth: 08-25-1955  Today's Date: 01/30/2012 Time: 0930-1030 Time Calculation (min): 60 min  Short Term Goals: Week 4:  OT Short Term Goal 1 (Week 4): Pt will maintain static sitting for 5 mins with close supervision in preparation for selfcare tasks  OT Short Term Goal 2 (Week 4): Pt will perform LB bathing sit to stand with Total +2 (pt 40%).  OT Short Term Goal 3 (Week 4): Pt will perfrom UB dressing in supportive sitting with min facilitation  OT Short Term Goal 4 (Week 4): Pt will perform supine to sit with max assist in preparation for selfcare tasks  OT Short Term Goal 5 (Week 4): Pt will perform toilet transfers squat pivot with max assist  Skilled Therapeutic Interventions/Progress Updates:    Pt seen for ADL retraining with focus on pt's increased participation in initiation and sequencing of rolling, bed mobility, and bathing and dressing.  Pt demonstrated increased initiation with reaching with RUE for bed rails to assist in rolling and when sitting EOB.  Pt continues to require +2 for bed mobility, supine to sit, and transfers with/without AE (slide board).  Increased sitting balance this session, with use of bed rail to maintain upright balance as well as occasional trunk control to correct LOB to Rt and posterior.  Completed LB dressing at sit to stand level with total assist +2 to maintain standing balance and clothing management.  Slide board transfer from bed to w/c with +2 total assist, however pt showing slight initiation with reaching towards w/c arm rest to assist with transfer.  Pt's wife not present for family education this session, however RN reports she had been in room this AM.  Pt's wife returned as this therapist was walking out of pt's room at end of session.  Therapy Documentation Precautions:  Precautions Precautions: Fall Precaution Comments: Lt sided  hemiparesis  Restrictions Weight Bearing Restrictions: No Pain:  Pt reports pain in Lt shoulder with flexion greater than 90 degrees.  See FIM for current functional status  Therapy/Group: Individual Therapy  Leonette Monarch 01/30/2012, 11:16 AM

## 2012-01-30 NOTE — Progress Notes (Addendum)
Wife participated with min. hands on care with much encouragement; assisted with tx to [facility], performed hygiene.  Educated (observed) peg dressing change, discussed s/s infection r/t peg site, drainage, educated re: free water bolus' via peg and then wife performed with supervision. Demonstrated proper technique.Carlean Purl

## 2012-01-30 NOTE — Progress Notes (Signed)
Social Work Patient ID: Collin Diaz, male   DOB: Dec 18, 1955, 56 y.o.   MRN: [id number] Met with wife per her request, she is on board now with the NHP plan.  She is aware of MD's recommendations and teams. She would like him placed in a NH [place] bus [place] due to her transportation issue.  Worker informed her will need to find a facility that Will take pt's pending Medicaid with a letter from the hospital.  Sending out FL2.

## 2012-01-30 NOTE — Progress Notes (Signed)
Speech Language Pathology Daily Session Note  Patient Details  Name: Collin Diaz MRN: [id number] Date of Birth: 10-19-1955  Today's Date: 01/30/2012 Time: 6644-0347 Time Calculation (min): 40 min  Short Term Goals: Week 4: SLP Short Term Goal 1 (Week 4): Pt will utilize respiratory support sufficient to sustain phonation for 10 seconds with mod cues. SLP Short Term Goal 2 (Week 4): Patient will tolerate trials of Dys. 2 textures with max assist multimodal cuing for efficient mastication and timely anterior-posterior transit of bolus  SLP Short Term Goal 3 (Week 4): Pt will produce 3-5 word utterances with mod assist to increase intelligibility.  SLP Short Term Goal 4 (Week 4): Pt will demonstrate improved volitional cough as a compensatory strategy for reduced pharyngeal sensation and airway protection. SLP Short Term Goal 5 (Week 4): Pt will tolerate dysphagia 1 diet with nectar thick liquids with mod assist for precautions.  Skilled Therapeutic Interventions: Treatment session addressed dysphagia goals and continuation of family education. SLP facilitated session with educating patient and wife regarding Dys.2 textures and wanting wife to observe oral phase with food that requires mastication.  Wife reports not wanting to try upgraded textures and that she cannot stay for today's session and then left.  SLP facilitated session with max assist verbal cues for patient to attend to mastication with oral phase lasting between 2-3 minutes per bite; mod assist verbal and tactile cues to attend to anterior to posterior oral transit and timely swallows while consuming nectar-thick liquids via cup. Patient exhibited a delayed cough x1, which indicates sensed penetration due to increased sip size. SLP recommends continuation of Dys.1 textures and nectar-thick liquids at meals and Dys.2 trials with SLP only.  SLP continues to recommend intermittent supervision when wife is feeding patient; need for  further family education continues.    FIM:  Comprehension Comprehension Mode: Auditory Comprehension: 3-Understands basic 50 - 74% of the time/requires cueing 25 - 50%  of the time Expression Expression Mode: Verbal Expression: 1-Expresses basis less than 25% of the time/requires cueing greater than 75% of the time. Social Interaction Social Interaction: 2-Interacts appropriately 25 - 49% of time - Needs frequent redirection. Problem Solving Problem Solving: 2-Solves basic 25 - 49% of the time - needs direction more than half the time to initiate, plan or complete simple activities Memory Memory: 3-Recognizes or recalls 50 - 74% of the time/requires cueing 25 - 49% of the time FIM - Eating Eating Activity: 4: Helper checks for pocketed food  Pain Pain Assessment Pain Assessment: No/denies pain  Therapy/Group: Individual Therapy  Charlane Ferretti., CCC-SLP [phone]  Chaslyn Eisen 01/30/2012, 1:23 PM

## 2012-01-30 NOTE — Progress Notes (Signed)
Note read and agreed upon

## 2012-01-30 NOTE — Progress Notes (Signed)
Social Work Patient ID: Collin Diaz, male   DOB: [birth year], 56 y.o.   MRN: [id number] Met with wife to discuss family education.  She is here today thought she was going home.  She complained about not Feeling well and this worker encouraged her to go home.  She is not really doing hands on care with pt and team.   She refused to go to therapy with pt and instead stayed in the room in the recliner.  Her health problems seem to  Be more than she can handle and is not able to assist husband.  Discussed again pursuing NHP and MD along with PA spoke with her also.  Plan to send out FL2 and pursue placement.

## 2012-01-31 ENCOUNTER — Inpatient Hospital Stay (HOSPITAL_COMMUNITY): Payer: Medicaid Other | Admitting: Speech Pathology

## 2012-01-31 ENCOUNTER — Inpatient Hospital Stay (HOSPITAL_COMMUNITY): Payer: Medicaid Other | Admitting: Occupational Therapy

## 2012-01-31 ENCOUNTER — Inpatient Hospital Stay (HOSPITAL_COMMUNITY): Payer: Medicaid Other | Admitting: Physical Therapy

## 2012-01-31 MED ORDER — METHYLPHENIDATE HCL 5 MG PO TABS
5.0000 mg | ORAL_TABLET | Freq: Two times a day (BID) | ORAL | Status: DC
  Administered 2012-01-31 – 2012-02-04 (×10): 5 mg via ORAL
  Filled 2012-01-31 (×10): qty 1

## 2012-01-31 NOTE — Progress Notes (Signed)
Speech Language Pathology Daily Session Note  Patient Details  Name: Collin Diaz MRN: [id number] Date of Birth: [birth year]  Today's Date: 01/31/2012 Time: 2952-8413 Time Calculation (min): 35 min  Short Term Goals: Week 4: SLP Short Term Goal 1 (Week 4): Pt will utilize respiratory support sufficient to sustain phonation for 10 seconds with mod cues. SLP Short Term Goal 2 (Week 4): Patient will tolerate trials of Dys. 2 textures with max assist multimodal cuing for efficient mastication and timely anterior-posterior transit of bolus  SLP Short Term Goal 3 (Week 4): Pt will produce 3-5 word utterances with mod assist to increase intelligibility.  SLP Short Term Goal 4 (Week 4): Pt will demonstrate improved volitional cough as a compensatory strategy for reduced pharyngeal sensation and airway protection. SLP Short Term Goal 5 (Week 4): Pt will tolerate dysphagia 1 diet with nectar thick liquids with mod assist for precautions.  Skilled Therapeutic Interventions: Treatment session focused addressing dysphagia goals during a self-feeding task.  SLP facilitated session by educating wife regarding not speaking for patient and allowing extra time for him to verbally respond; SLP also demonstrated use of choice of two throughout session. SLP also facilitated session with mod faded to min assist verbal cues to attend to bolus during oral phase and complete a timely swallow.  Patient consumed nectar-thick liquids via cup with consecutive sips today and displayed no overt s/s of aspiration, indicating improved oral controls of bolus. SLP continues to recommend intermittent supervision when wife is feeding patient.     FIM:  Comprehension Comprehension Mode: Auditory Comprehension: 3-Understands basic 50 - 74% of the time/requires cueing 25 - 50%  of the time Expression Expression Mode: Verbal Expression: 2-Expresses basic 25 - 49% of the time/requires cueing 50 - 75% of the time. Uses single  words/gestures. Social Interaction Social Interaction: 2-Interacts appropriately 25 - 49% of time - Needs frequent redirection. Problem Solving Problem Solving: 2-Solves basic 25 - 49% of the time - needs direction more than half the time to initiate, plan or complete simple activities Memory Memory: 3-Recognizes or recalls 50 - 74% of the time/requires cueing 25 - 49% of the time FIM - Eating Eating Activity: 4: Helper checks for pocketed food  Pain Pain Assessment Pain Assessment: No/denies pain  Therapy/Group: Individual Therapy  Charlane Ferretti., CCC-SLP [phone]  Takashi Korol 01/31/2012, 3:29 PM

## 2012-01-31 NOTE — Patient Care Conference (Signed)
Inpatient RehabilitationTeam Conference Note Date: 01/31/2012   Time: 11:00 AM    Patient Name: Collin Diaz      Medical Record Number: [id number]  Date of Birth: [birth year] Sex: Male         Room/Bed: 4032/4032-01 Payor Info: Payor: MEDICAID [place]  Plan: MEDICAID OF [place]  Product Type: *No Product type*     Admitting Diagnosis: ICH  Admit Date/Time:  01/03/2012  4:50 PM Admission Comments: No comment available   Primary Diagnosis:  Thalamic hemorrhage Principal Problem: Thalamic hemorrhage  Patient Active Problem List   Diagnosis Date Noted  . Syncope 01/09/2012  . Induced hypernatremia 01/03/2012  . Obesity (BMI 30.0-34.9) 01/03/2012  . OSA (obstructive sleep apnea) 01/03/2012  . Renal insufficiency 01/03/2012  . Chronic ischemic left MCA stroke 01/03/2012  . Aphasia as late effect of cerebrovascular accident 01/03/2012  . Depression 01/03/2012  . 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 ALLERGIC RHINITIS 02/07/2007  . FACIAL RASH 02/07/2007  . GOUT NOS 08/18/2006  . Malignant hypertension 08/18/2006  . KIDNEY DISEASE, CHRONIC, STAGE V 08/18/2006    Expected Discharge Date:    Team Members Present: Physician: Dr. Claudette Laws Social Worker Present: Dossie Der, LCSW Nurse Present: Gregor Hams, RN PT Present: [name], Lillie Columbia, PT OT Present: Bretta Bang, Verlene Mayer, OT SLP Present: Fae Pippin, SLP Other (Discipline and Name): Charolette Child Coordinator     Current Status/Progress Goal Weekly Team Focus  Medical   PEG placed, medically stable for SNF  encourage po intake  optimize calories by combined po and enteral routes   Bowel/Bladder   LBM 01/30/12  Foley catheter due to urinary retention/incontinent of bowel.         Swallow/Nutrition/ Hydration   Dys. 1 textures and nectar thick liquids  least restrictive p.o. intake   education   ADL's   mod assist bathing, mod assist grooming, max assist UB dressing, +2 LBdressing and  transfers. Pt demonstrating increased initiation, however requires increased time and cues to follow task through to completion  downgraded goals: mod UB bathing and dressing, max assist LB bathing and dressing and transfers   trunk control with static and dynamic sitting, increased initiation and follow through of tasks    Mobility   max-total A  mod A bed mobility, w/c mobility, max A slideboard vs. hoyer lift  postural control, balance, transfers, w/c mobility   Communication   max assist  mod assist   increase initiation and sustain attention   Safety/Cognition/ Behavioral Observations  max assist   mod assist   increase sustained attention and initiation    Pain   no pain issues  free of pain      Skin   skin extremely dry to bilateral feet/eucerin to feet  no new skin breakdown         *See Interdisciplinary Assessment and Plan and progress notes for long and short-term goals  Barriers to Discharge: no 24/7 care    Possible Resolutions to Barriers:  wife now interested in pursuing SNF    Discharge Planning/Teaching Needs:  Pursuing NHP, wife here but not participating in therapies and leaves when asked to participates.      Team Discussion:  Brighter today-making eye contact and smiled.  Wife present but not participating.  Holds meds in mouth needs cuing to swallow.  Continues to be max level assist  Revisions to Treatment Plan:  NHP looking for bed   Continued Need for Acute Rehabilitation Level of Care: The  patient requires daily medical management by a physician with specialized training in physical medicine and rehabilitation for the following conditions: Daily direction of a multidisciplinary physical rehabilitation program to ensure safe treatment while eliciting the highest outcome that is of practical value to the patient.: Yes Daily medical management of patient stability for increased activity during participation in an intensive rehabilitation regime.: Yes Daily  analysis of laboratory values and/or radiology reports with any subsequent need for medication adjustment of medical intervention for : Neurological problems  Collin Diaz, Collin Diaz 01/31/2012, 2:17 PM

## 2012-01-31 NOTE — Progress Notes (Signed)
Physical Therapy Weekly Progress Note  Patient Details  Name: Collin Diaz MRN: [id number] Date of Birth: April 03, 1956  Today's Date: 01/31/2012 Time: 4098-1191 Time Calculation (min): 60 min  Patient continues to make slow progress and has met 0 of 5 long term goals.  Patient's LTG have been downgraded once again to mod-max A overall for bed mobility in hospital bed and w/c mobility with hemi technique in controlled environment and max A with slideboard vs. Hoyer lift for SNF.  Patient continues to require max-total A +2 for basic mobility, transfers and for sitting and static standing balance and postural control.  Despite patient's slow progress and continued need for significant physical assistance and teams recommendation for SNF for more long term rehabilitation, patient's wife continued to verbalized desire to have patient D/C home with her but was unwilling to participate in hands on functional mobility training with all therapies and has recently agreed to SNF placement.      Patient continues to demonstrate the following deficits: impaired activity tolerance, impaired cognition with impaired initiation, attention, apraxia, L hemiplegia with hypertonicity and decreased LE and trunk ROM, impaired dynamic sitting balance, postural control and therefore will continue to benefit from skilled PT intervention to enhance overall performance with activity tolerance, balance, postural control, ability to compensate for deficits, functional use of  left upper extremity and left lower extremity, attention and coordination.  Patient not progressing toward long term goals.  See goal revision..  Continue plan of care.  PT Short Term Goals Week 3:  PT Short Term Goal 1 (Week 3): Patient will initiate 25% of the time for bed mobility training with use of UE support and max A PT Short Term Goal 1 - Progress (Week 3): Met PT Short Term Goal 2 (Week 3): Patient will transfer bed <> w/c with max A and  initiate 25% of the time PT Short Term Goal 2 - Progress (Week 3): Partly met PT Short Term Goal 3 (Week 3): Patient will perform w/c mobility on unit x 50' with R hemi technique and 75% cues for initiation and sequencing  PT Short Term Goal 3 - Progress (Week 3): Partly met PT Short Term Goal 4 (Week 3): Patient will tolerate dynamic sitting balance and postural control training activities without back support x 15 minutes with mod-max A PT Short Term Goal 4 - Progress (Week 3): Met PT Short Term Goal 5 (Week 3): Patient will tolerate static standing in appropriate lift equipment for NMR to LLE, trunk control and pre-gait activities x 5 minutes  PT Short Term Goal 5 - Progress (Week 3): Partly met Week 4:  PT Short Term Goal 1 (Week 4): = LTG  Skilled Therapeutic Interventions/Progress Updates:   No reports of pain; discussed with patient the use of hoyer lift at SNF by nursing staff to minimize pain with PEG, catheter and risk of falling and how patient can assist with hoyer lift transfer with rolling in bed for sling placement; began with bilat LE hamstring, hip ER, IR, and glute stretches to increase ROM for rolling and minimize LBP with transfer.  Performed bed mobility, rolling to L and R with assistance for L hip and knee flexion and verbal, tactile and visual cues for neck flexion for trunk activation and use of RUE to bring LUE across midline and reaching for target to facilitate upper trunk rotation; required total A to R and max A to L while positioning hoyer sling.  Patient transferred bed > w/c with  hoyer total A with no reports of pain at PEG or catheter.  Patient assisted with scooting buttocks posterior in chair initiating with pushing through RUE and LE and anterior lean with max verbal and tactile cues.  Patient to transfer back to bed after supper with nursing staff in hoyer.   Therapy Documentation Precautions:  Precautions Precautions: Fall Precaution Comments: L hemiplegia, PEG  Tube: maintain HOB at 30 degrees Restrictions Weight Bearing Restrictions: No Vital Signs: Therapy Vitals Temp: 98.7 F (37.1 C) Temp src: Oral Pulse Rate: 78  Resp: 18  BP: 109/105 mmHg Patient Position, if appropriate: Sitting Oxygen Therapy SpO2: 95 % O2 Device: None (Room air) Pain: Pain Assessment Pain Assessment: No/denies pain  See FIM for current functional status  Therapy/Group: Individual Therapy  [name] [facility] 01/31/2012, 4:38 PM

## 2012-01-31 NOTE — Progress Notes (Signed)
Occupational Therapy Session Note  Patient Details  Name: Collin Diaz MRN: [id number] Date of Birth: 1956-03-21  Today's Date: 01/31/2012 Time: 0930-1030 Time Calculation (min): 60 min  Short Term Goals: Week 4:  OT Short Term Goal 1 (Week 4): Pt will maintain static sitting for 5 mins with close supervision in preparation for selfcare tasks  OT Short Term Goal 2 (Week 4): Pt will perform LB bathing sit to stand with Total +2 (pt 40%).  OT Short Term Goal 3 (Week 4): Pt will perfrom UB dressing in supportive sitting with min facilitation  OT Short Term Goal 4 (Week 4): Pt will perform supine to sit with max assist in preparation for selfcare tasks  OT Short Term Goal 5 (Week 4): Pt will perform toilet transfers squat pivot with max assist  Skilled Therapeutic Interventions/Progress Updates:    Pt seen for ADL retraining with focus on initiation, sequencing, and increased participation in self-care tasks of bathing and dressing.  Pt appeared more alert this session with increased smiling and verbalizations when prompted.  Pt demonstrated increased initiation with bed mobility with reaching for bed rail with Rt hand and bending Rt leg up to prepare for rolling, however pt continues to require +2 assist for completion of rolling activity.  Engaged in sitting balance at EOB with pt demonstrating increased static sitting balance with supervision for 4-5 mins, however when pt is challenged to maintain sitting balance and complete task he is unable to maintain balance without physical assist.  Pt reported needing to have BM, squat pivot transfer total assist +2 to [facility] with bed elevated slightly to assist with lifting.  Pt total assist +2 for sit to stand for perineal hygiene with 3rd person completing hygiene.  Pt's wife present for session, observing, with exception of assisting with hygiene post BM.  Pt's wife reports attempting to assist him in pulling up in bed and reports pain in back and  shoulder and "unable to do it all by herself".  Reiterated additional need for SNF to allow him increased time to progress enough to make it safe for her to assist him.  Therapy Documentation Precautions:  Precautions Precautions: Fall Precaution Comments: L hemiplegia, PEG Tube: maintain HOB at 30 degrees Restrictions Weight Bearing Restrictions: No Pain:   Pt with no c/o pain this session.  See FIM for current functional status  Therapy/Group: Individual Therapy  Leonette Monarch 01/31/2012, 12:11 PM

## 2012-01-31 NOTE — Progress Notes (Signed)
Patient ID: VIKRAM TILLETT, male   DOB: 03-12-1956, 56 y.o.   MRN: [id number] HPI: JOHNIE MAKKI is a 56 y.o. 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 Has history of poor controlled BP and had not had meds filled recently. Admitted 12/30/2011 with weakness, slurring of speech with coughing/chocking and blood pressure 254/143. Patient placed on Cardene drip. Cranial CT and imaging revealed right thalamus and basal ganglia hemorrhage with intraventricular hemorrhage and cytotoxic cerebral edema with right to left midline shift. Evaluated by neurology services with tight blood pressure control with SBP<150. Started on 3% saline drip with goal of 155-160 Na to decrease cerebral edema. Noted dysphagia currently on a dysphagia 1 diet   Subjective/Complaints:  Pt ate 25-50% meals yesterday.  About fluid 12hour TF at 65 cc /hr  Review of Systems  Unable to perform ROS: medical condition  Constitutional: Positive for malaise/fatigue.  Neurological: Positive for focal weakness.     Objective: Vital Signs: Blood pressure 143/97, pulse 86, temperature 98.1 F (36.7 C), temperature source Oral, resp. rate 16, height 5' 8.9" (1.75 m), weight 98 kg (216 lb 0.8 oz), SpO2 97.00%. No results found. Results for orders placed during the hospital encounter of 01/03/12 (from the past 72 hour(s))  BASIC METABOLIC PANEL     Status: Abnormal   Collection Time   01/29/12  6:18 AM      Component Value Range Comment   Sodium 141  135 - 145 mEq/L    Potassium 4.8  3.5 - 5.1 mEq/L    Chloride 108  96 - 112 mEq/L    CO2 22  19 - 32 mEq/L    Glucose, Bld 163 (*) 70 - 99 mg/dL    BUN 39 (*) 6 - 23 mg/dL    Creatinine, Ser 4.09 (*) 0.50 - 1.35 mg/dL    Calcium 9.6  8.4 - 81.1 mg/dL    GFR calc non Af Amer 35 (*) >90 mL/min    GFR calc Af Amer 41 (*) >90 mL/min   PHOSPHORUS      Status: Normal   Collection Time   01/29/12  6:18 AM      Component Value Range Comment   Phosphorus 3.3  2.3 - 4.6 mg/dL   MAGNESIUM     Status: Abnormal   Collection Time   01/29/12  6:18 AM      Component Value Range Comment   Magnesium 2.7 (*) 1.5 - 2.5 mg/dL      HEENT: normal Cardio: RRR Resp: CTA B/L GI: BS positive, abdomen tender near tube, site appears clean with appropriate drainage Extremity:  No Edema Skin:   Intact Neuro: Flat, Cranial Nerve II-XII normal, Abnormal Sensory reduced lt touch on L, Abnormal Motor o/5 on L side, sensation trace on left also. Abnormal FMC Tone  Hypotonia, Dysarthric and Aphasic Musc/Skel:  Swelling Left hand  Assessment/Plan: 1. Functional deficits secondary to R thalamic hemorrhage which require 3+ hours per day of interdisciplinary therapy in a comprehensive inpatient rehab setting. Physiatrist is providing close team supervision and 24 hour management of active medical problems listed below. Physiatrist and rehab team continue to assess barriers to discharge/monitor patient progress toward functional and medical goals. FIM: FIM - Bathing Bathing Steps Patient Completed: Chest;Left Arm;Abdomen;Front perineal area;Right upper leg;Left upper leg Bathing: 3: Mod-Patient completes 5-7 42f 10 parts or 50-74%  FIM - Upper Body Dressing/Undressing Upper  body dressing/undressing steps patient completed: Put head through opening of pull over shirt/dress Upper body dressing/undressing: 2: Max-Patient completed 25-49% of tasks FIM - Lower Body Dressing/Undressing Lower body dressing/undressing steps patient completed: Pull pants up/down Lower body dressing/undressing: 1: Two helpers  FIM - Toileting Toileting: 1: Two helpers  FIM - Diplomatic Services operational officer Devices: Psychiatrist Transfers: 1-Two helpers  FIM - Banker Devices: Sliding board;Bed rails;Arm rests Bed/Chair  Transfer: 1: Two helpers  FIM - Locomotion: Wheelchair Distance: 20 Locomotion: Wheelchair: 1: Total Assistance/staff pushes wheelchair (Pt<25%) FIM - Locomotion: Ambulation Locomotion: Ambulation: 0: Activity did not occur  Comprehension Comprehension Mode: Auditory Comprehension: 3-Understands basic 50 - 74% of the time/requires cueing 25 - 50%  of the time  Expression Expression Mode: Verbal Expression: 1-Expresses basis less than 25% of the time/requires cueing greater than 75% of the time.  Social Interaction Social Interaction: 2-Interacts appropriately 25 - 49% of time - Needs frequent redirection.  Problem Solving Problem Solving: 2-Solves basic 25 - 49% of the time - needs direction more than half the time to initiate, plan or complete simple activities  Memory Memory: 3-Recognizes or recalls 50 - 74% of the time/requires cueing 25 - 49% of the time   Medical Problem List and Plan:  1. DVT Prophylaxis/Anticoagulation: Pharmaceutical: Lovenox  2. Pain Management: tylenol, can use ultram for pain associated with g tube 3. Mood:  low dose celexa, abilify already initiated, but change to HS dosing. Apparently he had been using this as an outpt, but it's unclear how long, why , or if he had been compliant with usage.  4. Neuropsych: This patient is not capable of making decisions on his/her own behalf Reduced intiation  Methylphenidate-reduce dose and monitor 5. Chronic renal failure now at baseline with po, G tube fluids       6. HTN: controlled 7. Dysphagia: D1, nectar thick liquids -poor intake due to cognition and dysphagia- should improve with CVA recovery over the next 1-2 months  -G tube placed 10/11  -TF's initiated.Run 12hr/day  -ultram prn for pain.  -pain improved today  8.  Post stroke seizure d/o keppra  LOS (Days) 28 A FACE TO FACE EVALUATION WAS PERFORMED  [name],[name] 01/31/2012, 6:53 AM

## 2012-02-01 ENCOUNTER — Inpatient Hospital Stay (HOSPITAL_COMMUNITY): Payer: Medicaid Other | Admitting: Occupational Therapy

## 2012-02-01 ENCOUNTER — Inpatient Hospital Stay (HOSPITAL_COMMUNITY): Payer: Medicaid Other | Admitting: Speech Pathology

## 2012-02-01 ENCOUNTER — Inpatient Hospital Stay (HOSPITAL_COMMUNITY): Payer: Medicaid Other | Admitting: Physical Therapy

## 2012-02-01 DIAGNOSIS — I69998 Other sequelae following unspecified cerebrovascular disease: Secondary | ICD-10-CM

## 2012-02-01 DIAGNOSIS — Z5189 Encounter for other specified aftercare: Secondary | ICD-10-CM

## 2012-02-01 DIAGNOSIS — R209 Unspecified disturbances of skin sensation: Secondary | ICD-10-CM

## 2012-02-01 DIAGNOSIS — I69991 Dysphagia following unspecified cerebrovascular disease: Secondary | ICD-10-CM

## 2012-02-01 DIAGNOSIS — G8110 Spastic hemiplegia affecting unspecified side: Secondary | ICD-10-CM

## 2012-02-01 DIAGNOSIS — I619 Nontraumatic intracerebral hemorrhage, unspecified: Secondary | ICD-10-CM

## 2012-02-01 MED ORDER — OSMOLITE 1.5 CAL PO LIQD
237.0000 mL | Freq: Three times a day (TID) | ORAL | Status: DC
  Administered 2012-02-01 – 2012-02-11 (×18): 237 mL
  Filled 2012-02-01 (×51): qty 237

## 2012-02-01 NOTE — Progress Notes (Signed)
Patient ID: Collin Diaz, male   DOB: June 22, 1955, 56 y.o.   MRN: [id number] HPI: Collin Diaz is a 56 y.o. 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 Has history of poor controlled BP and had not had meds filled recently. Admitted 12/30/2011 with weakness, slurring of speech with coughing/chocking and blood pressure 254/143. Patient placed on Cardene drip. Cranial CT and imaging revealed right thalamus and basal ganglia hemorrhage with intraventricular hemorrhage and cytotoxic cerebral edema with right to left midline shift. Evaluated by neurology services with tight blood pressure control with SBP<150. Started on 3% saline drip with goal of 155-160 Na to decrease cerebral edema. Noted dysphagia currently on a dysphagia 1 diet   Subjective/Complaints:  Pt ate 25-50% meals yesterday.  About fluid 12hour TF at 65 cc /hr  Review of Systems  Unable to perform ROS: medical condition  Constitutional: Positive for malaise/fatigue.  Neurological: Positive for focal weakness.     Objective: Vital Signs: Blood pressure 117/81, pulse 95, temperature 99.1 F (37.3 C), temperature source Oral, resp. rate 17, height 5' 8.9" (1.75 m), weight 98 kg (216 lb 0.8 oz), SpO2 97.00%. No results found. No results found for this or any previous visit (from the past 72 hour(s)).   HEENT: normal Cardio: RRR Resp: CTA B/L GI: BS positive, abdomen tender near tube, site appears clean with appropriate drainage Extremity:  No Edema Skin:   Intact Neuro: Flat, Cranial Nerve II-XII normal, Abnormal Sensory reduced lt touch on L, Abnormal Motor o/5 on L side, sensation trace on left also. Abnormal FMC Tone  Hypotonia, Dysarthric and Aphasic Musc/Skel:  Swelling Left hand, mild pain with L shoulder ROM  Assessment/Plan: 1. Functional deficits secondary to R thalamic hemorrhage  which require 3+ hours per day of interdisciplinary therapy in a comprehensive inpatient rehab setting. Physiatrist is providing close team supervision and 24 hour management of active medical problems listed below. Physiatrist and rehab team continue to assess barriers to discharge/monitor patient progress toward functional and medical goals. FIM: FIM - Bathing Bathing Steps Patient Completed: Chest;Left Arm;Abdomen;Front perineal area;Right upper leg;Left upper leg Bathing: 3: Mod-Patient completes 5-7 68f 10 parts or 50-74%  FIM - Upper Body Dressing/Undressing Upper body dressing/undressing steps patient completed: Thread/unthread right sleeve of pullover shirt/dresss;Thread/unthread left sleeve of pullover shirt/dress;Put head through opening of pull over shirt/dress Upper body dressing/undressing: 2: Max-Patient completed 25-49% of tasks FIM - Lower Body Dressing/Undressing Lower body dressing/undressing steps patient completed: Pull pants up/down Lower body dressing/undressing: 1: Two helpers  FIM - Toileting Toileting: 1: Two helpers  FIM - Diplomatic Services operational officer Devices: Psychiatrist Transfers: 1-Two helpers  FIM - Banker Devices: Bed rails (hoyer) Bed/Chair Transfer: 1: Supine > Sit: Total A (helper does all/Pt. < 25%);1: Sit > Supine: Total A (helper does all/Pt. < 25%);1: Bed > Chair or W/C: Total A (helper does all/Pt. < 25%);1: Chair or W/C > Bed: Total A (helper does all/Pt. < 25%);1: Mechanical lift  FIM - Locomotion: Wheelchair Distance: 20 Locomotion: Wheelchair: 0: Activity did not occur FIM - Locomotion: Ambulation Locomotion: Ambulation: 0: Activity did not occur  Comprehension Comprehension Mode: Auditory Comprehension: 3-Understands basic 50 - 74% of the time/requires cueing 25 - 50%  of the time  Expression Expression Mode: Verbal Expression: 2-Expresses basic 25 - 49% of the  time/requires cueing 50 - 75% of the time.  Uses single words/gestures.  Social Interaction Social Interaction: 2-Interacts appropriately 25 - 49% of time - Needs frequent redirection.  Problem Solving Problem Solving: 2-Solves basic 25 - 49% of the time - needs direction more than half the time to initiate, plan or complete simple activities  Memory Memory: 3-Recognizes or recalls 50 - 74% of the time/requires cueing 25 - 49% of the time   Medical Problem List and Plan:  1. DVT Prophylaxis/Anticoagulation: Pharmaceutical: Lovenox  2. Pain Management: tylenol, can use ultram for pain associated with g tube 3. Mood:  low dose celexa, abilify already initiated, but change to HS dosing. Apparently he had been using this as an outpt, but it's unclear how long, why , or if he had been compliant with usage.  4. Neuropsych: This patient is not capable of making decisions on his/her own behalf Reduced intiation  Methylphenidate-reduce dose and monitor 5. Chronic renal failure now at baseline with po, G tube fluids       6. HTN: controlled 7. Dysphagia: D1, nectar thick liquids -poor intake due to cognition and dysphagia- should improve with CVA recovery over the next 1-2 months  -G tube placed 10/11  -TF's initiated.Run 12hr/day  -ultram prn for pain.  -pain improved today  8.  Post stroke seizure d/o keppra  LOS (Days) 29 A FACE TO FACE EVALUATION WAS PERFORMED  [name],[name] 02/01/2012, 7:16 AM

## 2012-02-01 NOTE — Progress Notes (Signed)
I have read and agree with the following treatment session.  Bethan Adamek Hall, PT, DPT 

## 2012-02-01 NOTE — Progress Notes (Signed)
Speech Language Pathology Daily Session Note  Patient Details  Name: Collin Diaz MRN: [id number] Date of Birth: 12-08-1955  Today's Date: 02/01/2012 Time: 4098-1191 Time Calculation (min): 25 min  Short Term Goals: Week 4: SLP Short Term Goal 1 (Week 4): Pt will utilize respiratory support sufficient to sustain phonation for 10 seconds with mod cues. SLP Short Term Goal 2 (Week 4): Patient will tolerate trials of Dys. 2 textures with max assist multimodal cuing for efficient mastication and timely anterior-posterior transit of bolus  SLP Short Term Goal 3 (Week 4): Pt will produce 3-5 word utterances with mod assist to increase intelligibility.  SLP Short Term Goal 4 (Week 4): Pt will demonstrate improved volitional cough as a compensatory strategy for reduced pharyngeal sensation and airway protection. SLP Short Term Goal 5 (Week 4): Pt will tolerate dysphagia 1 diet with nectar thick liquids with mod assist for precautions.  Skilled Therapeutic Interventions: Treatment session focused addressing dysphagia goals during a self-feeding task. SLP facilitated session with min assist verbal cues to attend to bolus during oral phase and complete a timely swallow. Patient consumed Dys.1 textures and nectar-thick liquids via cup with consecutive sips and no overt s/s of aspiration.  SLP continues to recommend intermittent supervision when wife is feeding patient.    FIM:  Comprehension Comprehension Mode: Auditory Comprehension: 3-Understands basic 50 - 74% of the time/requires cueing 25 - 50%  of the time Expression Expression Mode: Verbal Expression: 2-Expresses basic 25 - 49% of the time/requires cueing 50 - 75% of the time. Uses single words/gestures. Social Interaction Social Interaction: 2-Interacts appropriately 25 - 49% of time - Needs frequent redirection. Problem Solving Problem Solving: 2-Solves basic 25 - 49% of the time - needs direction more than half the time to initiate,  plan or complete simple activities Memory Memory: 3-Recognizes or recalls 50 - 74% of the time/requires cueing 25 - 49% of the time  Pain Pain Assessment Pain Assessment: No/denies pain  Therapy/Group: Individual Therapy  Charlane Ferretti., CCC-SLP [phone]  Morgaine Kimball 02/01/2012, 2:14 PM

## 2012-02-01 NOTE — Progress Notes (Signed)
Social Work Patient ID: Collin Diaz, male   DOB: November 29, 1955, 56 y.o.   MRN: [id number] Can not find any local NH facility to offer pt a bed.  Have informed pt and wife will need to expand search for NH bed. Wife does not want this due to she will not be able to visit pt if he is out of town.  No other options at this time.

## 2012-02-01 NOTE — Progress Notes (Signed)
Social Work Patient ID: Collin Diaz, male   DOB: 08-04-1955, 55 y.o.   MRN: [id number] Have contacted a few area NH facilities who have expressed interest in pt.  Wife would really like one that is on the bus line. Since this is her only means of transportation.  Awaiting return calls for facilities.

## 2012-02-01 NOTE — Progress Notes (Signed)
Physical Therapy Session Note  Patient Details  Name: Collin Diaz MRN: [id number] Date of Birth: 11/05/1955  Today's Date: 02/01/2012 Time: 1500-1600 Time Calculation (min): 60 min  Short Term Goals: Week 2:  PT Short Term Goal 1 (Week 2): Patient will initiate 25% of the time for bed mobility training with use of UE support and max A PT Short Term Goal 1 - Progress (Week 2): Progressing toward goal PT Short Term Goal 2 (Week 2): Patient will transfer bed <> w/c with max A and initiate 25% of the time  PT Short Term Goal 2 - Progress (Week 2): Progressing toward goal PT Short Term Goal 3 (Week 2): Patient will perform w/c mobility on unit x 50' with R hemi technique and 75% cues for initiation and sequencing  PT Short Term Goal 3 - Progress (Week 2): Progressing toward goal PT Short Term Goal 4 (Week 2): Patient will tolerate dynamic sitting balance and postural control training activities without back support x 15 minutes with mod-max A PT Short Term Goal 4 - Progress (Week 2): Progressing toward goal PT Short Term Goal 5 (Week 2): Patient will tolerate static standing in appropriate lift equipment for NMR to LLE, trunk control and pre-gait activities x 5 minutes  PT Short Term Goal 5 - Progress (Week 2): Progressing toward goal  Therapy Documentation Precautions:  Precautions Precautions: Fall Precaution Comments: L hemiplegia, PEG Tube: maintain HOB at 30 degrees Restrictions Weight Bearing Restrictions: No   Vital Signs: Therapy Vitals Pulse Rate: 95  BP: 161/116 mmHg (After standing for 5 min) Patient Position, if appropriate: Sitting Pain: Pain Assessment Pain Assessment: No/denies pain   Other Treatments:   Patient lying in bed upon PT arrival. Passive hamstring stretch x 1 min bilaterally with contact/release technique. Patient performed rolling bilaterally, requiring 2 person assist and max vcs for sequencing and technique. Patient moved from bed > w/c using  MaxiMove.  Patient performed w/c <> mat sliding board transfer using RUE to assist and 2 helpers (patient assist 10%). Patient continues to need vcs for initiating forward lean, hand placement, and sequencing. On mat, forward leaning was facilitated by having patient push with RUE against therapist's shoulder while keeping head/chest up. Patient able to performed with min assist x 10 reps forward. Progressed to leaning forward and to the L and R. Patient required max vcs throughout exercise for posture and form.  Patient performed controlled posterior lean to physioball, followed by anterior translation of trunk to target with emphasis on activating core muscles and postural control. Patient able to perform x 3 reps, requiring max verbal cues. Patient presents with increased difficulty keeping chest tall (sustained activation of trunk extensors).  Patient stood with RUE support and 2 helpers (total A) for ~2 min at end of session. Patient performed lateral hip bumps to the R with emphasis on weight shifting and forced use of LLE. Patient required sitting break. Patient requested to stand again, initiating movement. Stood for 3 min, performed lateral hip bumps x 10 to R. Attempted to step with RLE, however unable to do so. BP was checked once patient was in sitting, secondary to patient having increased difficulty maintaining sitting balance. BP: 161/116. RN notified and asked to check again. BP: 158/105, RN reported this was his normal.  Patient returned to chair and taken back to room. Call bell in reach.  See FIM for current functional status  Therapy/Group: Individual Therapy  Myron Lona Hamilton DPT Student  02/01/2012, 4:30 PM

## 2012-02-01 NOTE — Progress Notes (Signed)
Occupational Therapy Session Note  Patient Details  Name: Collin Diaz MRN: [id number] Date of Birth: 10-Dec-1955  Today's Date: 02/01/2012 Time: 0930-1030 Time Calculation (min): 60 min  Short Term Goals: Week 4:  OT Short Term Goal 1 (Week 4): Pt will maintain static sitting for 5 mins with close supervision in preparation for selfcare tasks  OT Short Term Goal 2 (Week 4): Pt will perform LB bathing sit to stand with Total +2 (pt 40%).  OT Short Term Goal 3 (Week 4): Pt will perfrom UB dressing in supportive sitting with min facilitation  OT Short Term Goal 4 (Week 4): Pt will perform supine to sit with max assist in preparation for selfcare tasks  OT Short Term Goal 5 (Week 4): Pt will perform toilet transfers squat pivot with max assist  Skilled Therapeutic Interventions/Progress Updates:    Pt seen for ADL retraining with focus on initiation, sequencing, weight shifting, and increased participation in self-care tasks of bathing and dressing.  Pt required increased verbal and tactile cues to remain alert this session, with frequent eye closing.  Pt required +2 for all bed mobility, sidelying to sit, and slide board transfer from bed to w/c.  Pt required increased time and cues this session for initiation and to maintain sitting balance.  Pt with frequent LOB to Rt and posterior in sitting EOB.  Slide board transfer from bed to w/c with total assist +2 with pt reaching towards w/c, but not assisting with movement.    Pt's wife present at beginning of session.  She interacted with pt and this therapist and then reported that she needed to go for awhile.    Therapy Documentation Precautions:  Precautions Precautions: Fall Precaution Comments: L hemiplegia, PEG Tube: maintain HOB at 30 degrees Restrictions Weight Bearing Restrictions: No Pain: Pain Assessment Pain Assessment: Faces Faces Pain Scale: Hurts little more Pain Location: Buttocks Pain Intervention(s):  Repositioned  See FIM for current functional status  Therapy/Group: Individual Therapy  Leonette Monarch 02/01/2012, 12:07 PM

## 2012-02-01 NOTE — Progress Notes (Signed)
Nutrition Follow-up  Intervention:   1. Transition to bolus regimen, recommend bolus of 1 can (8 oz) of Osmolite 1.5 QID, or a total of 4 cans daily. Administer bolus only if pt does not complete 50% of meals, per PA. Maximum bolus regimen will provide: 1420 kcal, 60 grams protein, 724 ml free water. This will meet 75% of estimated kcal and protein needs. 2. RD to continue to follow nutrition care plan  Assessment:   Pt is now planning to d/c to SNF. Pt continues on nocturnal feedings and is at goal rate of Osmolite 1.5 at 65 ml/hr. Intake remains suboptimal, mostly <50% of meals; continues on Dysphagia 1 diet with nectar liquids.  Per discussion with RN, pt requested TF to be held this morning around 4 AM 2/2 abdominal distention. Pt without significant residuals. This RD notified PA Pam Love of this issue. She requested transition to bolus at this time.  Skin: skin remains intact Labs: reviewed Last BM: 10/17  Diet Order:  Dysphagia 1 diet, Nectar Thickened Liquids Supplements: Ensure Pudding PO TID TF: Osmolite 1.5 at 65 ml/hr x 12 hours - 1170 kcal, 49 grams protein, 594 ml free water; this meets 62% kcal and 61% protein needs  Free water 200 ml QID - 800 ml water daily total; provides an additional 1394 ml free water with TF  Meds: Scheduled Meds:    . ARIPiprazole  1 mg Oral QHS  . citalopram  10 mg Oral Daily  . doxazosin  4 mg Oral QHS  . enoxaparin (LOVENOX) injection  40 mg Subcutaneous Q24H  . feeding supplement  1 Container Oral TID BM  . feeding supplement (OSMOLITE 1.5 CAL)  1,000 mL Per Tube Q24H  . free water  200 mL Per Tube QID  . hydrocerin   Topical BID  . levETIRAcetam  500 mg Oral BID  . lisinopril  5 mg Oral Daily  . methylphenidate  5 mg Oral BID WC   Continuous Infusions:   PRN Meds:.acetaminophen, acetaminophen, bisacodyl, cloNIDine, food thickener, ondansetron (ZOFRAN) IV, ondansetron, prochlorperazine, prochlorperazine, prochlorperazine, RESOURCE  THICKENUP CLEAR, senna-docusate, traMADol, traZODone  Labs:  CMP     Component Value Date/Time   NA 141 01/29/2012 0618   K 4.8 01/29/2012 0618   CL 108 01/29/2012 0618   CO2 22 01/29/2012 0618   GLUCOSE 163* 01/29/2012 0618   BUN 39* 01/29/2012 0618   CREATININE 2.02* 01/29/2012 0618   CALCIUM 9.6 01/29/2012 0618   PROT 6.9 01/04/2012 0606   ALBUMIN 3.2* 01/04/2012 0606   AST 23 01/04/2012 0606   ALT 29 01/04/2012 0606   ALKPHOS 90 01/04/2012 0606   BILITOT 0.5 01/04/2012 0606   GFRNONAA 35* 01/29/2012 0618   GFRAA 41* 01/29/2012 0618   Sodium  Date/Time Value Range Status  01/29/2012  6:18 AM 141  135 - 145 mEq/L Final  01/25/2012 11:17 AM 138  135 - 145 mEq/L Final  01/22/2012  6:05 AM 137  135 - 145 mEq/L Final    Potassium  Date/Time Value Range Status  01/29/2012  6:18 AM 4.8  3.5 - 5.1 mEq/L Final  01/25/2012 11:17 AM 4.0  3.5 - 5.1 mEq/L Final  01/22/2012  6:05 AM 4.0  3.5 - 5.1 mEq/L Final    Phosphorus  Date/Time Value Range Status  01/29/2012  6:18 AM 3.3  2.3 - 4.6 mg/dL Final  1/61/0960  4:54 AM 2.8  2.3 - 4.6 mg/dL Final    Magnesium  Date/Time Value Range Status  01/29/2012  6:18 AM 2.7* 1.5 - 2.5 mg/dL Final  1/61/0960  4:54 AM 1.9  1.5 - 2.5 mg/dL Final   CBG (last 3)  No results found for this basename: GLUCAP:3 in the last 72 hours   Intake/Output Summary (Last 24 hours) at 02/01/12 1056 Last data filed at 02/01/12 0800  Gross per 24 hour  Intake   2530 ml  Output   1750 ml  Net    780 ml   Weight Status:  216 lb - 10/17; finally stabiling Pt has lost a total of 16 lb since rehab admission.  Body mass index is 32.00 kg/(m^2). Obese Class I  Etimated needs:  1875-2075 kcal, 80 -90 grams protein  Nutrition Dx:  Inadequate oral intake r/t poor attention now AEB poor meal completion and need for PEG.  Goal:  Pt to meet >/= 90% of their estimated nutrition needs - met  Monitor:  weight trends, lab trends, I/O's, PO intake, supplement  tolerance, TF tolerance  Jarold Motto MS, [place], LDN Pager: [phone] After-hours pager: [phone]

## 2012-02-02 ENCOUNTER — Inpatient Hospital Stay (HOSPITAL_COMMUNITY): Payer: Medicaid Other | Admitting: Physical Therapy

## 2012-02-02 ENCOUNTER — Inpatient Hospital Stay (HOSPITAL_COMMUNITY): Payer: Medicaid Other | Admitting: Speech Pathology

## 2012-02-02 ENCOUNTER — Inpatient Hospital Stay (HOSPITAL_COMMUNITY): Payer: Medicaid Other | Admitting: Occupational Therapy

## 2012-02-02 MED ORDER — ACETAMINOPHEN 160 MG/5ML PO SOLN
650.0000 mg | Freq: Two times a day (BID) | ORAL | Status: DC
  Administered 2012-02-02 – 2012-02-09 (×10): 650 mg
  Filled 2012-02-02 (×8): qty 20.3

## 2012-02-02 NOTE — Progress Notes (Signed)
Occupational Therapy Weekly Progress Note and Treatment Session Note  Patient Details  Name: Collin Diaz MRN: [id number] Date of Birth: 1955/08/26  Today's Date: 02/02/2012 Time: 0930-1030 Time Calculation (min): 60 min  Patient has met 1 of 5 short term goals.  Revised short term goals with increased focus on initiation, sequencing, attention to task, carryover from previous sessions, and weight shifting to engage in aspects of self-care tasks of bathing and dressing.  Pt is showing some increased carryover of techniques and is initiating somewhat with self-care tasks.  Pt continues to require question cues to initiate and cues for weight shifting.  Patient continues to demonstrate the following deficits: Lt hemiparesis, increased tone, decreased arousal, impaired attention, initiation, sequencing, delayed processing, apraxia, impaired trunk and postural control and therefore will continue to benefit from skilled OT intervention to enhance overall performance with BADL and Reduce care partner burden.  Patient not progressing toward long term goals.  See goal revision..  Plan of care revisions: downgraded transfer goals..  OT Short Term Goals Week 4:  OT Short Term Goal 1 (Week 4): Pt will maintain static sitting for 5 mins with close supervision in preparation for selfcare tasks  OT Short Term Goal 1 - Progress (Week 4): Met OT Short Term Goal 2 (Week 4): Pt will perform LB bathing sit to stand with Total +2 (pt 40%).  OT Short Term Goal 2 - Progress (Week 4): Not progressing OT Short Term Goal 3 (Week 4): Pt will perfrom UB dressing in supportive sitting with min facilitation  OT Short Term Goal 3 - Progress (Week 4): Partly met OT Short Term Goal 4 (Week 4): Pt will perform supine to sit with max assist in preparation for selfcare tasks  OT Short Term Goal 4 - Progress (Week 4): Not progressing OT Short Term Goal 5 (Week 4): Pt will perform toilet transfers squat pivot with max  assist OT Short Term Goal 5 - Progress (Week 4): Not progressing Week 5:  OT Short Term Goal 1 (Week 5): Pt will complete dynamic sitting balance for 5 mins while washing UB seated EOB. OT Short Term Goal 2 (Week 5): Pt will demonstrate initiation with slide board transfer with ability to reach towards w/c handle and assist with sliding with <25% cues. OT Short Term Goal 3 (Week 5): Pt will complete bed mobility with max assist +1 for positioning and hygiene of peri area. OT Short Term Goal 4 (Week 5): Pt will perfrom UB dressing in supportive sitting with min facilitation   Skilled Therapeutic Interventions/Progress Updates:    Pt seen for ADL retraining with focus on initiation, sequencing, weight shifting, and increased participation in self-care tasks of bathing and dressing.  Pt required +2 for all bed mobility, sidelying to sit, and slide board transfer from bed to w/c. Pt required increased time and cues this session for initiation and to maintain sitting balance, with tactile and visual cues for positioning. Pt with frequent LOB to Rt and posterior in sitting EOB. Pt demonstrated increased initiation with question cues with self-bathing by reaching for basin and retrieving wash cloth with question cues.  Engaged in sit to stand x2 with total assist +2.  Pt demonstrated increased weight shifting (10%) and reached for target to promote upright standing balance.  Slide board transfer from bed to w/c with total assist +2 with pt reaching towards w/c, but not assisting with movement.  Pt continues to require physical assist to lean forward to spit when brushing teeth.  Pt's wife not present for session.  Therapy Documentation Precautions:  Precautions Precautions: Fall Precaution Comments: L hemiplegia, PEG Tube: maintain HOB at 30 degrees Restrictions Weight Bearing Restrictions: No Pain:  Pt with no c/o pain this session. ADL: ADL Grooming: Moderate assistance Where Assessed-Grooming:  Sitting at sink;Wheelchair Upper Body Bathing: Minimal assistance Where Assessed-Upper Body Bathing: Edge of bed Lower Body Bathing: Moderate assistance Where Assessed-Lower Body Bathing: Edge of bed;Bed level Upper Body Dressing: Maximal assistance Where Assessed-Upper Body Dressing: Edge of bed Lower Body Dressing: Dependent Where Assessed-Lower Body Dressing: Edge of bed Toileting: Dependent Where Assessed-Toileting: Bedside Commode Toilet Transfer: Dependent Toilet Transfer Method: Charity fundraiser: Extra wide drop arm bedside commode ADL Comments: Focus of ADL sessions have been geared towards increased initiation, sequencing, attention to task, and weight shifting to engage in pieces of self-care task.  See FIM for current functional status  Therapy/Group: Individual Therapy  Leonette Monarch 02/02/2012, 12:18 PM

## 2012-02-02 NOTE — Progress Notes (Signed)
I have read and agree with the following treatment session.  Audra Hall, PT, DPT 

## 2012-02-02 NOTE — Progress Notes (Signed)
Speech Language Pathology Daily Session Note  Patient Details  Name: Collin Diaz MRN: [id number] Date of Birth: 01/30/1956  Today's Date: 02/02/2012 Time: 1135-1220 Time Calculation (min): 45 min  Short Term Goals: Week 4: SLP Short Term Goal 1 (Week 4): Pt will utilize respiratory support sufficient to sustain phonation for 10 seconds with mod cues. SLP Short Term Goal 2 (Week 4): Patient will tolerate trials of Dys. 2 textures with max assist multimodal cuing for efficient mastication and timely anterior-posterior transit of bolus  SLP Short Term Goal 3 (Week 4): Pt will produce 3-5 word utterances with mod assist to increase intelligibility.  SLP Short Term Goal 4 (Week 4): Pt will demonstrate improved volitional cough as a compensatory strategy for reduced pharyngeal sensation and airway protection. SLP Short Term Goal 5 (Week 4): Pt will tolerate dysphagia 1 diet with nectar thick liquids with mod assist for precautions.  Skilled Therapeutic Interventions: Treatment session focused addressing speech-language and dysphagia goals during a self-feeding task. SLP facilitated session with wait time, choice of two and max assist verbal and visual cues to verbally express basic wants and needs throughout session.  SLP also facilitated session with min assist verbal cues to attend to bolus during oral phase and complete a timely swallow prior to another bite. Patient consumed Dys.1 textures and nectar-thick liquids via cup with consecutive sips and no overt s/s of aspiration. Trials of Dys.2 textures resulted in significantly prolonged mastication, throat clears, coughs when followed by a sip due to suspected pharyngeal residue and difficulty managing the different consistencies.  Of note, wife preset and asleep for session; continue with current orders.    FIM:  Comprehension Comprehension Mode: Auditory Comprehension: 3-Understands basic 50 - 74% of the time/requires cueing 25 - 50%  of the  time Expression Expression Mode: Verbal Expression: 2-Expresses basic 25 - 49% of the time/requires cueing 50 - 75% of the time. Uses single words/gestures. Social Interaction Social Interaction: 2-Interacts appropriately 25 - 49% of time - Needs frequent redirection. Problem Solving Problem Solving: 2-Solves basic 25 - 49% of the time - needs direction more than half the time to initiate, plan or complete simple activities Memory Memory: 2-Recognizes or recalls 25 - 49% of the time/requires cueing 51 - 75% of the time FIM - Eating Eating Activity: 4: Helper checks for pocketed food  Pain Pain Assessment Pain Assessment: No/denies pain  Therapy/Group: Individual Therapy  Charlane Ferretti., CCC-SLP [phone]   Speech Language Pathology Weekly Progress Note  Patient Details  Name: Collin Diaz MRN: [id number] Date of Birth: 02-22-1956  Today's Date: 02/02/2012  Short Term Goals: Week 4: SLP Short Term Goal 1 (Week 4): Pt will utilize respiratory support sufficient to sustain phonation for 10 seconds with mod cues. SLP Short Term Goal 1 - Progress (Week 4): Progressing toward goal SLP Short Term Goal 2 (Week 4): Patient will tolerate trials of Dys. 2 textures with max assist multimodal cuing for efficient mastication and timely anterior-posterior transit of bolus  SLP Short Term Goal 2 - Progress (Week 4): Progressing toward goal SLP Short Term Goal 3 (Week 4): Pt will produce 3-5 word utterances with mod assist to increase intelligibility.  SLP Short Term Goal 3 - Progress (Week 4): Progressing toward goal SLP Short Term Goal 4 (Week 4): Pt will demonstrate improved volitional cough as a compensatory strategy for reduced pharyngeal sensation and airway protection. SLP Short Term Goal 4 - Progress (Week 4): Progressing toward goal SLP Short Term Goal  5 (Week 4): Pt will tolerate dysphagia 1 diet with nectar thick liquids with mod assist for precautions. SLP Short Term Goal 5  - Progress (Week 4): Met Week 5: SLP Short Term Goal 1 (Week 5): Patient will utilize respiratory support sufficient to sustain phonation for 10 seconds with mod cues. SLP Short Term Goal 2 (Week 5): Patient will tolerate trials of Dys. 2 textures with max assist multimodal cuing for efficient mastication and timely anterior-posterior transit of bolus  SLP Short Term Goal 3 (Week 5): Patient will produce 3-5 word utterances with mod assist to increase intelligibility. SLP Short Term Goal 4 (Week 5): Patient will demonstrate improved volitional cough as a compensatory strategy for airway protection.  Weekly Progress Updates: Patient met 1 out of 5 short term objectives this reporting period by consuming Dys.1 textures and nectar-thick liquids mod assist and no overt s/s f aspiration.  Patient also had functional gains in sustained phonation for 5-6 seconds and able to produce 2-3 word utterances per breath; however, he did not meet these goals.  Additionally, he continues to be unable to produce a volitional cough or demonstrate efficient mastication of Dys.2 textures.  Patient continues to attempt to communicate largely through gestures although he is able to verbalize at the phrase level with mod-max assist cues. Patient's arousal continues to fluctuate and lethargy still appears to impact performance during his sessions. Patient continues to be a good candidate for speech services during stay on CIR in order to maximize functional independence and reduce burden of care prior to SNF discharge.  SLP Frequency: 1-2 X/day, 30-60 minutes;Total of 15 hours over 7 days;5 out of 7 days Frequency due to: fatigue and decreased arousal; working toward increasing paticipation and endurance  Estimated Length of Stay: pending SNF placement  SLP Treatment/Interventions: Cognitive remediation/compensation;Cueing hierarchy;Dysphagia/aspiration precaution training;Environmental controls;Functional  tasks;Internal/external aids;Patient/family education;Speech/Language facilitation;Therapeutic Activities;Therapeutic Exercise  Charlane Ferretti., CCC-SLP [phone]  Gretta Samons 02/02/2012, 1:12 PM

## 2012-02-02 NOTE — Progress Notes (Signed)
Social Work Patient ID: Collin Diaz, male   DOB: [birth year], 56 y.o.   MRN: [id number] Met with wife to inform her of all facilities only one that has expressed interest is [facility] in [place]. She really wants him placed in [place] so she will be able to see him, since she has no transportation and uses [employer].  Have contacted [employer] to inquire about pt's disability application and see if this can be pushed through or the  Status of this.  If pt had approved Medicaid he would have more options.  Contact [facility] if actual offer.

## 2012-02-02 NOTE — Progress Notes (Signed)
Physical Therapy Session Note  Patient Details  Name: ZACKREY DYAR MRN: [id number] Date of Birth: [birth year]  Today's Date: 02/02/2012 Time: 1430-1530 Time Calculation (min): 60 min  Short Term Goals: Week 3:  PT Short Term Goal 1 (Week 3): Patient will initiate 25% of the time for bed mobility training with use of UE support and max A PT Short Term Goal 1 - Progress (Week 3): Met PT Short Term Goal 2 (Week 3): Patient will transfer bed <> w/c with max A and initiate 25% of the time PT Short Term Goal 2 - Progress (Week 3): Partly met PT Short Term Goal 3 (Week 3): Patient will perform w/c mobility on unit x 50' with R hemi technique and 75% cues for initiation and sequencing  PT Short Term Goal 3 - Progress (Week 3): Partly met PT Short Term Goal 4 (Week 3): Patient will tolerate dynamic sitting balance and postural control training activities without back support x 15 minutes with mod-max A PT Short Term Goal 4 - Progress (Week 3): Met PT Short Term Goal 5 (Week 3): Patient will tolerate static standing in appropriate lift equipment for NMR to LLE, trunk control and pre-gait activities x 5 minutes  PT Short Term Goal 5 - Progress (Week 3): Partly met     Therapy Documentation Precautions:  Precautions Precautions: Fall Precaution Comments: L hemiplegia, PEG Tube: maintain HOB at 30 degrees Restrictions Weight Bearing Restrictions: No   Vital Signs: Therapy Vitals Temp: 98.3 F (36.8 C) Temp src: Oral Pulse Rate: 85  Resp: 19  BP: 147/96 mmHg Patient Position, if appropriate: Sitting Oxygen Therapy SpO2: 100 % O2 Device: None (Room air) Pain: Pain Assessment Pain Assessment: No/denies pain   Other Treatments:   Patient upon beside commode upon PT arrival. Wife performed necessary hygiene. Patient performed sit > stand transfer with max A of one to allow wife to help with pants. Patient requested to performed a squat pivot transfer to the L from the bedside commode >  w/c, requiring two helpers (patient assisted 40%).  Patient performed w/c propulsion using R hemi technique ~35 feet from room > nurse's station. Patient taken to gym where he performed w/c > mat transfer to R side with 2 helper assist.  Patient performed sit > supine transfer with max assist. Patient performed hip flexion/extension and knee flexion/extension reps using powder board and MaxiSlide. Mirror used for Financial trader. Patient able to initiate movements, requiring min assist for full ROM.  Patient performed supine > sit using RUE and max assist to sit at EOB. Patient performed mat > w/c squat pivot transfer to the L with two helper assist (patient performed 40%). Patient taken back to room and left sitting in w/c with call bell in reach.   Patient has shown significant progress in the past two days. Patient is verbalizing more during session, initiating movements/tasks, and has some return of LLE function. Will continue to progress as appropriate.   See FIM for current functional status  Therapy/Group: Individual Therapy  Edword Cu Hamilton DPT Student  02/02/2012, 4:49 PM

## 2012-02-02 NOTE — Progress Notes (Signed)
Patient ID: Collin Diaz, male   DOB: 1955-10-04, 56 y.o.   MRN: [id number] HPI: Collin Diaz is a 56 y.o. 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 Has history of poor controlled BP and had not had meds filled recently. Admitted 12/30/2011 with weakness, slurring of speech with coughing/chocking and blood pressure 254/143. Patient placed on Cardene drip. Cranial CT and imaging revealed right thalamus and basal ganglia hemorrhage with intraventricular hemorrhage and cytotoxic cerebral edema with right to left midline shift. Evaluated by neurology services with tight blood pressure control with SBP<150. Started on 3% saline drip with goal of 155-160 Na to decrease cerebral edema. Noted dysphagia currently on a dysphagia 1 diet   Subjective/Complaints:  Pt ate 40% meals yesterday.  About fluid 12hour TF at 65 cc /hr Severe dysarthria Review of Systems  Unable to perform ROS: medical condition  Constitutional: Positive for malaise/fatigue.  Neurological: Positive for focal weakness.     Objective: Vital Signs: Blood pressure 134/89, pulse 86, temperature 99.3 F (37.4 C), temperature source Oral, resp. rate 17, height 5' 8.9" (1.75 m), weight 98 kg (216 lb 0.8 oz), SpO2 95.00%. No results found. No results found for this or any previous visit (from the past 72 hour(s)).   HEENT: normal Cardio: RRR Resp: CTA B/L GI: BS positive, abdomen tender near tube, site appears clean with appropriate drainage Extremity:  No Edema Skin:   Intact Neuro: Flat, Cranial Nerve II-XII normal, Abnormal Sensory reduced lt touch on L,now able to identify which finger was touched Abnormal Motor o/5 on L side, sensation trace on left also. Abnormal FMC Tone  Hypotonia, Dysarthric and Aphasic Musc/Skel:  Swelling Left hand, mild pain with L shoulder  ROM  Assessment/Plan: 1. Functional deficits secondary to R thalamic hemorrhage which require 3+ hours per day of interdisciplinary therapy in a comprehensive inpatient rehab setting. Physiatrist is providing close team supervision and 24 hour management of active medical problems listed below. Physiatrist and rehab team continue to assess barriers to discharge/monitor patient progress toward functional and medical goals. FIM: FIM - Bathing Bathing Steps Patient Completed: Chest;Left Arm;Abdomen;Right upper leg;Left upper leg Bathing: 3: Mod-Patient completes 5-7 9f 10 parts or 50-74%  FIM - Upper Body Dressing/Undressing Upper body dressing/undressing steps patient completed: Thread/unthread left sleeve of pullover shirt/dress;Put head through opening of pull over shirt/dress Upper body dressing/undressing: 2: Max-Patient completed 25-49% of tasks FIM - Lower Body Dressing/Undressing Lower body dressing/undressing steps patient completed: Pull pants up/down Lower body dressing/undressing: 1: Two helpers  FIM - Toileting Toileting: 1: Two helpers  FIM - Diplomatic Services operational officer Devices: Psychiatrist Transfers: 1-Two helpers  FIM - Banker Devices: Sliding board;Bed rails;Arm rests Bed/Chair Transfer: 1: Mechanical lift;1: Bed > Chair or W/C: Total A (helper does all/Pt. < 25%);1: Chair or W/C > Bed: Total A (helper does all/Pt. < 25%);1: Two helpers  FIM - Locomotion: Wheelchair Distance: 20 Locomotion: Wheelchair: 1: Total Assistance/staff pushes wheelchair (Pt<25%) FIM - Locomotion: Ambulation Locomotion: Ambulation: 0: Activity did not occur  Comprehension Comprehension Mode: Auditory Comprehension: 3-Understands basic 50 - 74% of the time/requires cueing 25 - 50%  of the time  Expression Expression Mode: Verbal Expression: 2-Expresses basic 25 - 49% of the time/requires cueing 50 - 75% of the time. Uses  single words/gestures.  Social Interaction Social Interaction: 2-Interacts appropriately 25 - 49% of time -  Needs frequent redirection.  Problem Solving Problem Solving: 2-Solves basic 25 - 49% of the time - needs direction more than half the time to initiate, plan or complete simple activities  Memory Memory: 3-Recognizes or recalls 50 - 74% of the time/requires cueing 25 - 49% of the time   Medical Problem List and Plan:  1. DVT Prophylaxis/Anticoagulation: Pharmaceutical: Lovenox  2. Pain Management: tylenol, can use ultram for pain associated with g tube 3. Mood:  low dose celexa, abilify already initiated, but change to HS dosing. Apparently he had been using this as an outpt, but it's unclear how long, why , or if he had been compliant with usage.  4. Neuropsych: This patient is not capable of making decisions on his/her own behalf Reduced intiation  Methylphenidate-reduce dose and monitor 5. Chronic renal failure now at baseline with po, G tube fluids       6. HTN: controlled 7. Dysphagia: D1, nectar thick liquids -poor intake due to cognition and dysphagia- should improve with CVA recovery over the next 1-2 months  -G tube placed 10/11,mild soreness around site no signs of infection -TF's initiated.Run 12hr/day  -ultram prn for pain.  -pain improved today  8.  Post stroke seizure d/o keppra  LOS (Days) 30 A FACE TO FACE EVALUATION WAS PERFORMED  Kacey Dysert E 02/02/2012, 7:29 AM

## 2012-02-03 ENCOUNTER — Inpatient Hospital Stay (HOSPITAL_COMMUNITY): Payer: Medicaid Other | Admitting: Occupational Therapy

## 2012-02-03 NOTE — Progress Notes (Signed)
Occupational Therapy Session Note  Patient Details  Name: VISHAAN TURENNE MRN: [id number] Date of Birth: [birth year]  Today's Date: 02/03/2012 Time: 1000-1050 Time Calculation (min): 50 min  Short Term Goals: Week 5:  OT Short Term Goal 1 (Week 5): Pt will complete dynamic sitting balance for 5 mins while washing UB seated EOB. OT Short Term Goal 2 (Week 5): Pt will demonstrate initiation with slide board transfer with ability to reach towards w/c handle and assist with sliding with <25% cues. OT Short Term Goal 3 (Week 5): Pt will complete bed mobility with max assist +1 for positioning and hygiene of peri area. OT Short Term Goal 4 (Week 5): Pt will perfrom UB dressing in supportive sitting with min facilitation   Skilled Therapeutic Interventions/Progress Updates:  Patient in bed upon arrival to address self care retraining and alert during entire session.  Patient indicated the need to have a bowel movement and requested bedpan.  Assisted patient with rolling for bedpan placement and removal was +2 yet patient initiated bending right knee and reaching for bedrail when roll to his left.  Focus of self care session was initiation, sequencing, problem solving and overall increased participation in bathing and dressing tasks. LB bath and dress performed with HOB up and rolling, UB bath and dress performed with HOB up in supported sitting. Patient requires increased time to process, initiate, proceed and complete steps to a task.  Therapy Documentation Precautions:  Precautions Precautions: Fall Precaution Comments: L hemiplegia, PEG Tube: maintain HOB at 30 degrees Restrictions Weight Bearing Restrictions: No Pain: Pain Assessment Pain Assessment: No/denies pain Pain Score: 0-No pain  See FIM for current functional status  Therapy/Group: Individual Therapy  Kiril Hippe 02/03/2012, 11:40 AM

## 2012-02-03 NOTE — Progress Notes (Signed)
Patient ID: Collin Diaz, male   DOB: Jun 10, 1955, 56 y.o.   MRN: [id number]  No complaints   HPI: Collin Diaz is a 56 y.o. 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 Has history of poor controlled BP and had not had meds filled recently. Admitted 12/30/2011 with weakness, slurring of speech with coughing/chocking and blood pressure 254/143. Patient placed on Cardene drip. Cranial CT and imaging revealed right thalamus and basal ganglia hemorrhage with intraventricular hemorrhage and cytotoxic cerebral edema with right to left midline shift. Evaluated by neurology services with tight blood pressure control with SBP<150. Started on 3% saline drip with goal of 155-160 Na to decrease cerebral edema. Noted dysphagia currently on a dysphagia 1 diet   Subjective/Complaints:  Pt ate 40% meals yesterday.  About fluid 12hour TF at 65 cc /hr Severe dysarthria Review of Systems  Unable to perform ROS: medical condition  Constitutional: Positive for malaise/fatigue.  Neurological: Positive for focal weakness.     Objective: Vital Signs: Blood pressure 153/93, pulse 85, temperature 98.3 F (36.8 C), temperature source Oral, resp. rate 19, height 5' 8.9" (1.75 m), weight 216 lb 0.8 oz (98 kg), SpO2 96.00%. No results found. No results found for this or any previous visit (from the past 72 hour(s)).   HEENT: normal Cardio: RRR Resp: CTA B/L GI: BS positive, abdomen tender near tube, site appears clean with appropriate drainage Extremity:  No Edema Skin:   Intact Neuro: Flat, Cranial Nerve II-XII normal, Abnormal Sensory reduced lt touch on L,now able to identify which finger was touched Abnormal Motor o/5 on L side, sensation trace on left also. Abnormal FMC Tone  Hypotonia, Dysarthric and Aphasic Musc/Skel:  Swelling Left hand, mild pain with L shoulder  ROM  Assessment/Plan: 1. Functional deficits secondary to R thalamic hemorrhage which require 3+ hours per day of interdisciplinary therapy in a comprehensive inpatient rehab setting. Physiatrist is providing close team supervision and 24 hour management of active medical problems listed below. Physiatrist and rehab team continue to assess barriers to discharge/monitor patient progress toward functional and medical goals. FIM: FIM - Bathing Bathing Steps Patient Completed: Chest;Left Arm;Abdomen;Front perineal area;Right upper leg;Left upper leg Bathing: 3: Mod-Patient completes 5-7 24f 10 parts or 50-74%  FIM - Upper Body Dressing/Undressing Upper body dressing/undressing steps patient completed: Thread/unthread left sleeve of pullover shirt/dress;Put head through opening of pull over shirt/dress Upper body dressing/undressing: 2: Max-Patient completed 25-49% of tasks FIM - Lower Body Dressing/Undressing Lower body dressing/undressing steps patient completed: Pull pants up/down Lower body dressing/undressing: 1: Two helpers  FIM - Toileting Toileting: 1: Two helpers  FIM - Diplomatic Services operational officer Devices: Psychiatrist Transfers: 1-Two helpers  FIM - Banker Devices: Sliding board;Bed rails;Arm rests Bed/Chair Transfer: 1: Two helpers;1: Chair or W/C > Bed: Total A (helper does all/Pt. < 25%);1: Bed > Chair or W/C: Total A (helper does all/Pt. < 25%);2: Supine > Sit: Max A (lifting assist/Pt. 25-49%);2: Sit > Supine: Max A (lifting assist/Pt. 25-49%)  FIM - Locomotion: Wheelchair Distance: 20 Locomotion: Wheelchair: 1: Travels less than 50 ft with moderate assistance (Pt: 50 - 74%) FIM - Locomotion: Ambulation Locomotion: Ambulation: 0: Activity did not occur  Comprehension Comprehension Mode: Auditory Comprehension: 3-Understands basic 50 - 74% of the time/requires cueing 25 - 50%  of the  time  Expression Expression Mode: Verbal Expression: 2-Expresses  basic 25 - 49% of the time/requires cueing 50 - 75% of the time. Uses single words/gestures.  Social Interaction Social Interaction: 2-Interacts appropriately 25 - 49% of time - Needs frequent redirection.  Problem Solving Problem Solving: 2-Solves basic 25 - 49% of the time - needs direction more than half the time to initiate, plan or complete simple activities  Memory Memory: 2-Recognizes or recalls 25 - 49% of the time/requires cueing 51 - 75% of the time   Medical Problem List and Plan:   1. DVT Prophylaxis/Anticoagulation: Pharmaceutical: Lovenox  2. Pain Management: tylenol, can use ultram for pain associated with g tube 3. Mood:  low dose celexa, abilify already initiated, but change to HS dosing. Apparently he had been using this as an outpt, but it's unclear how long, why , or if he had been compliant with usage.  4. Neuropsych: This patient is not capable of making decisions on his/her own behalf Reduced intiation  Methylphenidate-reduce dose and monitor 5. Chronic renal failure now at baseline with po, G tube fluids       6. HTN: controlled 7. Dysphagia: D1, nectar thick liquids -poor intake due to cognition and dysphagia- should improve with CVA recovery over the next 1-2 months  -G tube placed 10/11,mild soreness around site no signs of infection -TF's initiated.Run 12hr/day  -ultram prn for pain.  -pain improved today  8.  Post stroke seizure d/o keppra  LOS (Days) 31 A FACE TO FACE EVALUATION WAS PERFORMED  Alex Plotnikov 02/03/2012, 9:05 AM

## 2012-02-04 ENCOUNTER — Inpatient Hospital Stay (HOSPITAL_COMMUNITY): Payer: Medicaid Other | Admitting: *Deleted

## 2012-02-04 NOTE — Progress Notes (Signed)
Physical Therapy Note  Patient Details  Name: Collin Diaz MRN: [id number] Date of Birth: 1956-02-18 Today's Date: 02/04/2012  1430-1500 (30 minutes) individual (missed 15 minutes- Stroke Support Group) Pain : no reported pain Focus of treatment: bed mobility; sitting balance; transfer training Treatment: Rolling to left- max assist with pt initiating hip.knee flexion on right + max tactile cues to reach for rail on left; side to sit max assist; sitting balance- pt initially required max assist to attain midline sitting alignment, pt able to sit edge of bed to bedside table with min assist for 5 minutes; pt able to sit statically edge of bed without table X 3 for 3-4 minutes without loss of balance and min tactile cues for weight shifts to right. Pt unable to correct loss of balance to left or posterior . ; transfers total assist +2 with sliding board (pt 10%) . Pt taken to Stroke Support Group per patients request.   Daris Harkins,JIM 02/04/2012, 8:08 AM

## 2012-02-04 NOTE — Progress Notes (Signed)
No complaints   HPI: Collin Diaz is a 56 y.o. 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 Has history of poor controlled BP and had not had meds filled recently. Admitted 12/30/2011 with weakness, slurring of speech with coughing/chocking and blood pressure 254/143. Patient placed on Cardene drip. Cranial CT and imaging revealed right thalamus and basal ganglia hemorrhage with intraventricular hemorrhage and cytotoxic cerebral edema with right to left midline shift. Evaluated by neurology services with tight blood pressure control with SBP<150. Started on 3% saline drip with goal of 155-160 Na to decrease cerebral edema. Noted dysphagia currently on a dysphagia 1 diet   Subjective/Complaints:  Pt ate 40% meals yesterday.  About fluid 12hour TF at 65 cc /hr Severe dysarthria Review of Systems  Unable to perform ROS: medical condition  Constitutional: Positive for malaise/fatigue.  Neurological: Positive for focal weakness.     Objective: Vital Signs: Blood pressure 158/97, pulse 77, temperature 98.2 F (36.8 C), temperature source Oral, resp. rate 19, height 5' 8.9" (1.75 m), weight 213 lb 3 oz (96.7 kg), SpO2 99.00%. No results found. No results found for this or any previous visit (from the past 72 hour(s)).   HEENT: normal Cardio: RRR Resp: CTA B/L GI: BS positive, abdomen tender near tube, site appears clean with appropriate drainage Extremity:  No Edema Skin:   Intact Neuro: Flat, Cranial Nerve II-XII normal, Abnormal Sensory reduced lt touch on L,now able to identify which finger was touched Abnormal Motor o/5 on L side, sensation trace on left also. Abnormal FMC Tone  Hypotonia, Dysarthric and Aphasic Musc/Skel:  Swelling Left hand, mild pain with L shoulder ROM  Assessment/Plan: 1. Functional deficits secondary to R thalamic  hemorrhage which require 3+ hours per day of interdisciplinary therapy in a comprehensive inpatient rehab setting. Physiatrist is providing close team supervision and 24 hour management of active medical problems listed below. Physiatrist and rehab team continue to assess barriers to discharge/monitor patient progress toward functional and medical goals. FIM: FIM - Bathing Bathing Steps Patient Completed: Chest;Left Arm;Abdomen;Front perineal area;Right upper leg;Left upper leg Bathing: 3: Mod-Patient completes 5-7 62f 10 parts or 50-74%  FIM - Upper Body Dressing/Undressing Upper body dressing/undressing steps patient completed: Thread/unthread left sleeve of pullover shirt/dress;Put head through opening of pull over shirt/dress Upper body dressing/undressing: 2: Max-Patient completed 25-49% of tasks FIM - Lower Body Dressing/Undressing Lower body dressing/undressing steps patient completed: Pull pants up/down Lower body dressing/undressing: 1: Two helpers  FIM - Toileting Toileting: 1: Two helpers  FIM - Diplomatic Services operational officer Devices: Psychiatrist Transfers: 1-Two helpers  FIM - Banker Devices: Sliding board;Bed rails;Arm rests Bed/Chair Transfer: 1: Two helpers;1: Chair or W/C > Bed: Total A (helper does all/Pt. < 25%);1: Bed > Chair or W/C: Total A (helper does all/Pt. < 25%);2: Supine > Sit: Max A (lifting assist/Pt. 25-49%);2: Sit > Supine: Max A (lifting assist/Pt. 25-49%)  FIM - Locomotion: Wheelchair Distance: 20 Locomotion: Wheelchair: 1: Travels less than 50 ft with moderate assistance (Pt: 50 - 74%) FIM - Locomotion: Ambulation Locomotion: Ambulation: 0: Activity did not occur  Comprehension Comprehension Mode: Auditory Comprehension: 3-Understands basic 50 - 74% of the time/requires cueing 25 - 50%  of the time  Expression Expression Mode: Verbal Expression: 2-Expresses basic 25 - 49% of the  time/requires cueing 50 - 75% of the time. Uses single words/gestures.  Social Interaction Social Interaction: 2-Interacts appropriately 25 - 49% of time - Needs frequent redirection.  Problem Solving Problem Solving: 2-Solves basic 25 - 49% of the time - needs direction more than half the time to initiate, plan or complete simple activities  Memory Memory: 2-Recognizes or recalls 25 - 49% of the time/requires cueing 51 - 75% of the time   Medical Problem List and Plan:   1. DVT Prophylaxis/Anticoagulation: Pharmaceutical: Lovenox  2. Pain Management: tylenol, can use ultram for pain associated with g tube 3. Mood:  low dose celexa, abilify already initiated, but change to HS dosing. Apparently he had been using this as an outpt, but it's unclear how long, why , or if he had been compliant with usage.  4. Neuropsych: This patient is not capable of making decisions on his/her own behalf Reduced intiation  Methylphenidate-reduce dose and monitor 5. Chronic renal failure now at baseline with po, G tube fluids       6. HTN: controlled - cont Rx 7. Dysphagia: D1, nectar thick liquids -poor intake due to cognition and dysphagia- should improve with CVA recovery over the next 1-2 months  -G tube placed 10/11,mild soreness around site no signs of infection -TF's initiated.Run 12hr/day  -ultram prn for pain.  -pain improved today  8.  Post stroke seizure d/o keppra  LOS (Days) 32 A FACE TO FACE EVALUATION WAS PERFORMED  Alex Plotnikov 02/04/2012, 8:26 AM

## 2012-02-05 ENCOUNTER — Inpatient Hospital Stay (HOSPITAL_COMMUNITY): Payer: Medicaid Other | Admitting: Occupational Therapy

## 2012-02-05 ENCOUNTER — Inpatient Hospital Stay (HOSPITAL_COMMUNITY): Payer: Medicaid Other | Admitting: *Deleted

## 2012-02-05 ENCOUNTER — Inpatient Hospital Stay (HOSPITAL_COMMUNITY): Payer: Medicaid Other | Admitting: Speech Pathology

## 2012-02-05 MED ORDER — INFLUENZA VIRUS VACC SPLIT PF IM SUSP: 0.5000 mL | INTRAMUSCULAR | Status: DC | PRN

## 2012-02-05 NOTE — Progress Notes (Signed)
I have read and am in agreement with treatment note.  Clydene Laming, PT 02/05/2012 4:52 PM

## 2012-02-05 NOTE — Progress Notes (Signed)
Social Work Patient ID: Collin Diaz, male   DOB: Jul 19, 1955, 56 y.o.   MRN: [id number] Have spoken to few facilities who are considering pt but need Administrators approval.  Wife at home today, but Suppose to come up later this evening.  Await bed offer.

## 2012-02-05 NOTE — Progress Notes (Signed)
Physical Therapy Session Note  Patient Details  Name: Collin Diaz MRN: [id number] Date of Birth: 03-Feb-1956  Today's Date: 02/05/2012 Time: 1530-1630 Time Calculation (min): 60 min  Short Term Goals: Week 2:  PT Short Term Goal 1 (Week 2): Patient will initiate 25% of the time for bed mobility training with use of UE support and max A PT Short Term Goal 1 - Progress (Week 2): Progressing toward goal PT Short Term Goal 2 (Week 2): Patient will transfer bed <> w/c with max A and initiate 25% of the time  PT Short Term Goal 2 - Progress (Week 2): Progressing toward goal PT Short Term Goal 3 (Week 2): Patient will perform w/c mobility on unit x 50' with R hemi technique and 75% cues for initiation and sequencing  PT Short Term Goal 3 - Progress (Week 2): Progressing toward goal PT Short Term Goal 4 (Week 2): Patient will tolerate dynamic sitting balance and postural control training activities without back support x 15 minutes with mod-max A PT Short Term Goal 4 - Progress (Week 2): Progressing toward goal PT Short Term Goal 5 (Week 2): Patient will tolerate static standing in appropriate lift equipment for NMR to LLE, trunk control and pre-gait activities x 5 minutes  PT Short Term Goal 5 - Progress (Week 2): Progressing toward goal     Therapy Documentation Precautions:  Precautions Precautions: Fall Precaution Comments: L hemiplegia, PEG Tube: maintain HOB at 30 degrees Restrictions Weight Bearing Restrictions: No General:   Vital Signs: Therapy Vitals Pulse Rate: 81  BP: 153/109 mmHg Patient Position, if appropriate: Sitting Oxygen Therapy SpO2: 98 % O2 Device: None (Room air) Pain: Pain Assessment Pain Assessment: No/denies pain  Locomotion : Ambulation Ambulation: Yes Ambulation/Gait Assistance: 1: +2 Total assist Ambulation Distance (Feet): 3 Feet    Other Treatments:   Patient sitting in w/c upon arrival of PT. Patient c/o tightness in back, resulting in  inability to lean forward. Patient required mod assist x 2 helpers to scoot back in seat to improve sitting posture.  Patient propelled w/c from room down hall ~25 feet using R hemi technique and mod A at RLE to assist with directing. PT assisted rest of the way to gym (Total A). Passive hamstring stretch and DF stretch provided by PT prior to standing x 1 min, bilaterally to improve flexibility and low back pain.  Patient ambulated ~3 feet (6 small steps) using [employer]Plus and three helpers (one to assist with moving [place] Dec). Patient required assistance with weight shifting bilaterally, LLE progression, stabilizing on LLE, etc. Patient unable to maintain tall posture, resulting in forward head and trunk flexion.  Patient performed standing in [employer] with emphasis on hip extension and tall posture. Mirror used for Financial trader. Patient required max assist to get into position and unable to hold position without assist. Patient given visual targets to facilitate movement, in which patient was able to move toward, but again, unable to maintain position for longer than 5 sec. Will continue to work on to improve sitting/standing posture.   See FIM for current functional status  Therapy/Group: Individual Therapy  Krystena Reitter Hamilton DPT Student  02/05/2012, 4:39 PM

## 2012-02-05 NOTE — Progress Notes (Signed)
The skilled treatment note has been reviewed and SLP is in agreement. Jamaiya Tunnell, M.A., CCC-SLP [phone]  

## 2012-02-05 NOTE — Progress Notes (Signed)
Audible wheeze noted with increased activity. Foley patent with sediment. Incont BM during night. Patient called to inform staff of incontinent episode. Meds given via G-tube with H20 flushes and osmolite. Alfredo Martinez A

## 2012-02-05 NOTE — Progress Notes (Signed)
Speech Language Pathology Daily Session Note  Patient Details  Name: Collin Diaz MRN: [id number] Date of Birth: June 30, 1955  Today's Date: 02/05/2012 Time: 1130-1215 Time Calculation (min): 45 min  Short Term Goals: Week 5: SLP Short Term Goal 1 (Week 5): Patient will utilize respiratory support sufficient to sustain phonation for 10 seconds with mod cues. SLP Short Term Goal 2 (Week 5): Patient will tolerate trials of Dys. 2 textures with max assist multimodal cuing for efficient mastication and timely anterior-posterior transit of bolus  SLP Short Term Goal 3 (Week 5): Patient will produce 3-5 word utterances with mod assist to increase intelligibility. SLP Short Term Goal 4 (Week 5): Patient will demonstrate improved volitional cough as a compensatory strategy for airway protection.  Skilled Therapeutic Interventions: Treatment session focused on addressing speech-language and dysphagia goals during a self-feeding task. SLP facilitated session with wait time, choice of two, and max assist verbal and visual cues to verbally express basic wants and needs throughout session. SLP also facilitated session with min assist verbal cues to attend to bolus during oral phase and complete a timely swallow prior to another bite. Patient consumed Dys.1 textures and nectar-thick liquids via cup with consecutive sips and exhibited cough x1 on textures with increased bolus size. Trials of Dys.2 textures resulted in significantly prolonged mastication intermittent throat clears, which SLP suspects was related to pharyngeal residue, and left-sided buccal pocketing of bolus.  Of note, patient appeared to become distracted by different textures which further exacerbated his prolonged mastication and pocketing.   Continue with current orders.    FIM:  Comprehension Comprehension Mode: Auditory Comprehension: 4-Understands basic 75 - 89% of the time/requires cueing 10 - 24% of the time Expression Expression  Mode: Verbal Expression: 2-Expresses basic 25 - 49% of the time/requires cueing 50 - 75% of the time. Uses single words/gestures. Social Interaction Social Interaction: 2-Interacts appropriately 25 - 49% of time - Needs frequent redirection. Problem Solving Problem Solving: 2-Solves basic 25 - 49% of the time - needs direction more than half the time to initiate, plan or complete simple activities Memory Memory: 3-Recognizes or recalls 50 - 74% of the time/requires cueing 25 - 49% of the time FIM - Eating Eating Activity: 5: Supervision/cues;5: Set-up assist for open containers  Pain Pain Assessment Pain Assessment: No/denies pain  Therapy/Group: Individual Therapy  Jackalyn Lombard, Conrad Alta  Graduate Clinician Speech Language Pathology  Antolin Belsito, Joni Reining 02/05/2012, 4:06 PM

## 2012-02-05 NOTE — Progress Notes (Signed)
Occupational Therapy Session Note  Patient Details  Name: Collin Diaz MRN: [id number] Date of Birth: Aug 09, 1955  Today's Date: 02/05/2012 Time: 0930-1030 Time Calculation (min): 60 min  Short Term Goals: Week 5:  OT Short Term Goal 1 (Week 5): Pt will complete dynamic sitting balance for 5 mins while washing UB seated EOB. OT Short Term Goal 2 (Week 5): Pt will demonstrate initiation with slide board transfer with ability to reach towards w/c handle and assist with sliding with <25% cues. OT Short Term Goal 3 (Week 5): Pt will complete bed mobility with max assist +1 for positioning and hygiene of peri area. OT Short Term Goal 4 (Week 5): Pt will perfrom UB dressing in supportive sitting with min facilitation   Skilled Therapeutic Interventions/Progress Updates:    Pt seen for ADL retraining with focus on initiation, sequencing, weight shifting, and increased participation in self-care tasks of bathing and dressing. Pt required +1 for all bed mobility and sit to stand this sessions, but continues to require +2 for sidelying to sit and slide board transfer from bed to w/c. Pt with LOB to Rt and posterior in sitting EOB, however increased static sitting this session and carryover of "nose over toes" weight shifting when starting to lose balance with physical assist and verbal cues. Pt demonstrated increased initiation with question cues with self-bathing by reaching for basin and retrieving wash cloth with question cues. Engaged in sit to stand with total assist +1, while second person pulled up pants. Slide board transfer from bed to w/c with total assist +2 with pt reaching towards w/c, but not assisting with movement. Pt continues to require physical assist to lean forward to spit when brushing teeth.   Pt's wife not present for session.   Therapy Documentation Precautions:  Precautions Precautions: Fall Precaution Comments: L hemiplegia, PEG Tube: maintain HOB at 30  degrees Restrictions Weight Bearing Restrictions: No Pain:  Pt reports pain in Lt arm with shoulder abduction and elbow extension into weight bearing position.    See FIM for current functional status  Therapy/Group: Individual Therapy  Leonette Monarch 02/05/2012, 12:02 PM

## 2012-02-05 NOTE — Progress Notes (Signed)
Social Work Patient ID: Collin Diaz, male   DOB: 1956-02-22, 56 y.o.   MRN: [id number] Spoke with Disability Worker who reports she has approved his disability.  Spoke with Teresa-Medicaid worker who has also approved pt For Medicaid.  Will re-send FL2 out and inform area facilities that pt's Medicaid is approved.

## 2012-02-05 NOTE — Progress Notes (Signed)
Patient ID: Collin Diaz, male   DOB: 1955/06/06, 56 y.o.   MRN: [id number] HPI: BRAVEN WOLK is a 56 y.o. 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 Has history of poor controlled BP and had not had meds filled recently. Admitted 12/30/2011 with weakness, slurring of speech with coughing/chocking and blood pressure 254/143. Patient placed on Cardene drip. Cranial CT and imaging revealed right thalamus and basal ganglia hemorrhage with intraventricular hemorrhage and cytotoxic cerebral edema with right to left midline shift. Evaluated by neurology services with tight blood pressure control with SBP<150. Started on 3% saline drip with goal of 155-160 Na to decrease cerebral edema. Noted dysphagia currently on a dysphagia 1 diet   Subjective/Complaints:  Pt ate 60% meals yesterday.  About fluid 12hour TF at 65 cc /hr Severe dysarthria Review of Systems  Unable to perform ROS: medical condition  Constitutional: Positive for malaise/fatigue.  Neurological: Positive for focal weakness.     Objective: Vital Signs: Blood pressure 151/96, pulse 87, temperature 97.9 F (36.6 C), temperature source Oral, resp. rate 18, height 5' 8.9" (1.75 m), weight 97.5 kg (214 lb 15.2 oz), SpO2 98.00%. No results found. No results found for this or any previous visit (from the past 72 hour(s)).   HEENT: normal Cardio: RRR Resp: CTA B/L GI: BS positive, abdomen tender near tube, site appears clean with appropriate drainage Extremity:  No Edema Skin:   Intact Neuro: Flat, Cranial Nerve II-XII normal, Abnormal Sensory reduced lt touch on L,now able to identify which finger was touched Abnormal Motor o/5 on L side, sensation trace on left also. Abnormal FMC Tone  Hypotonia, Dysarthric and Aphasic Musc/Skel:  Swelling Left hand, mild pain with L shoulder  ROM  Assessment/Plan: 1. Functional deficits secondary to R thalamic hemorrhage which require 3+ hours per day of interdisciplinary therapy in a comprehensive inpatient rehab setting. Physiatrist is providing close team supervision and 24 hour management of active medical problems listed below. Physiatrist and rehab team continue to assess barriers to discharge/monitor patient progress toward functional and medical goals. FIM: FIM - Bathing Bathing Steps Patient Completed: Chest Bathing: 1: Total-Patient completes 0-2 of 10 parts or less than 25%  FIM - Upper Body Dressing/Undressing Upper body dressing/undressing steps patient completed: Thread/unthread left sleeve of pullover shirt/dress;Put head through opening of pull over shirt/dress Upper body dressing/undressing: 2: Max-Patient completed 25-49% of tasks FIM - Lower Body Dressing/Undressing Lower body dressing/undressing steps patient completed: Pull pants up/down Lower body dressing/undressing: 1: Two helpers  FIM - Toileting Toileting: 1: Two helpers  FIM - Diplomatic Services operational officer Devices: Psychiatrist Transfers: 1-Two helpers  FIM - Banker Devices: Sliding board;Bed rails;Arm rests Bed/Chair Transfer: 1: Two helpers;1: Chair or W/C > Bed: Total A (helper does all/Pt. < 25%);1: Bed > Chair or W/C: Total A (helper does all/Pt. < 25%);2: Supine > Sit: Max A (lifting assist/Pt. 25-49%);2: Sit > Supine: Max A (lifting assist/Pt. 25-49%)  FIM - Locomotion: Wheelchair Distance: 20 Locomotion: Wheelchair: 1: Travels less than 50 ft with moderate assistance (Pt: 50 - 74%) FIM - Locomotion: Ambulation Locomotion: Ambulation: 0: Activity did not occur  Comprehension Comprehension Mode: Auditory Comprehension: 3-Understands basic 50 - 74% of the time/requires cueing 25 - 50%  of the time  Expression Expression Mode: Verbal Expression: 2-Expresses basic 25 -  49% of the time/requires cueing 50 - 75%  of the time. Uses single words/gestures.  Social Interaction Social Interaction: 2-Interacts appropriately 25 - 49% of time - Needs frequent redirection.  Problem Solving Problem Solving: 2-Solves basic 25 - 49% of the time - needs direction more than half the time to initiate, plan or complete simple activities  Memory Memory: 2-Recognizes or recalls 25 - 49% of the time/requires cueing 51 - 75% of the time   Medical Problem List and Plan:  1. DVT Prophylaxis/Anticoagulation: Pharmaceutical: Lovenox  2. Pain Management: tylenol, can use ultram for pain  3. Mood:  low dose celexa, abilify already initiated, but change to HS dosing. Apparently he had been using this as an outpt,  4. Neuropsych: This patient is not capable of making decisions on his/her own behalf Reduced intiation  Methylphenidate-will d/c and observe , no apparent change with reduced dose.  Also appetite may be supressed by this med      5. Chronic renal failure now at baseline with po, G tube fluids       6. HTN: controlled 7. Dysphagia: D1, nectar thick liquids -poor intake due to cognition and dysphagia- should improve with CVA recovery over the next 1-2 months  -G tube placed 10/11,mild soreness around site no signs of infection -TF's initiated.Run 12hr/day  -ultram prn for pain.  -pain improved today  8.  Post stroke seizure d/o keppra  LOS (Days) 33 A FACE TO FACE EVALUATION WAS PERFORMED  [name],[name] 02/05/2012, 7:35 AM

## 2012-02-05 NOTE — Progress Notes (Signed)
Social Work Patient ID: Collin Diaz, male   DOB: May 12, 1955, 56 y.o.   MRN: [id number] Have sent information to [facility] regarding more information about pt.  Wife suppose to come back today, not here yet. Work on bed offer.  Left another message with Disability Determination worker-Kathleen regarding the status of pt's application. Have spoken with teresa Gilliard-Medicaid worker who reports still pending awaiting disability determination.

## 2012-02-06 ENCOUNTER — Inpatient Hospital Stay (HOSPITAL_COMMUNITY): Payer: Medicaid Other | Admitting: Speech Pathology

## 2012-02-06 ENCOUNTER — Inpatient Hospital Stay (HOSPITAL_COMMUNITY): Payer: Medicaid Other | Admitting: Occupational Therapy

## 2012-02-06 ENCOUNTER — Inpatient Hospital Stay (HOSPITAL_COMMUNITY): Payer: Medicaid Other | Admitting: Physical Therapy

## 2012-02-06 MED ORDER — LISINOPRIL 2.5 MG PO TABS
2.5000 mg | ORAL_TABLET | Freq: Two times a day (BID) | ORAL | Status: DC
  Administered 2012-02-06 – 2012-02-12 (×12): 2.5 mg via ORAL
  Filled 2012-02-06 (×14): qty 1

## 2012-02-06 MED ORDER — ENSURE PUDDING PO PUDG: 1.0000 | Freq: Three times a day (TID) | ORAL | Status: DC | PRN

## 2012-02-06 NOTE — Progress Notes (Signed)
Nutrition Follow-up  Intervention:   1. Change Ensure Pudding to PRN TID as pt will refuse approximately half of the time 2. Continue bolus regimen of Osmolite 1.5, 1 can QID if pt consumes <50% of meals 3. RD to continue to follow nutrition care plan  Assessment:   Remains on Dysphagia 1 diet with Nectar Thickened Liquids. Per nurse and administration records, pt has not been receiving daytime boluses as meal intake has been >50%. Pt continues to receive HS bolus to help supplement variable intake.  Skin: skin remains intact Labs: reviewed Last BM: 10/21  Diet Order:  Dysphagia 1 diet, Nectar Thickened Liquids Supplements: Ensure Pudding PO TID TF: Bolus of 1 can (8 oz) of Osmolite 1.5 QID, or a total of 4 cans daily. Administer bolus only if pt does not complete 50% of meals, per PA. Maximum bolus regimen will provide: 1420 kcal, 60 grams protein, 724 ml free water. This will meet 75% of estimated kcal and protein needs.  Free water 200 ml QID - 800 ml water daily total; provides an additional 1524 ml free water with TF  Meds: Scheduled Meds:    . acetaminophen (TYLENOL) oral liquid 160 mg/5 mL  650 mg Per Tube BID WC  . ARIPiprazole  1 mg Oral QHS  . citalopram  10 mg Oral Daily  . doxazosin  4 mg Oral QHS  . enoxaparin (LOVENOX) injection  40 mg Subcutaneous Q24H  . feeding supplement  1 Container Oral TID BM  . feeding supplement (OSMOLITE 1.5 CAL)  237 mL Per Tube TID WC & HS  . free water  200 mL Per Tube QID  . hydrocerin   Topical BID  . levETIRAcetam  500 mg Oral BID  . lisinopril  2.5 mg Oral BID  . DISCONTD: lisinopril  5 mg Oral Daily   Continuous Infusions:   PRN Meds:.acetaminophen, acetaminophen, bisacodyl, cloNIDine, food thickener, influenza  inactive virus vaccine, ondansetron (ZOFRAN) IV, ondansetron, prochlorperazine, prochlorperazine, prochlorperazine, RESOURCE THICKENUP CLEAR, senna-docusate, traMADol, traZODone  Labs:  CMP     Component Value  Date/Time   NA 141 01/29/2012 0618   K 4.8 01/29/2012 0618   CL 108 01/29/2012 0618   CO2 22 01/29/2012 0618   GLUCOSE 163* 01/29/2012 0618   BUN 39* 01/29/2012 0618   CREATININE 2.02* 01/29/2012 0618   CALCIUM 9.6 01/29/2012 0618   PROT 6.9 01/04/2012 0606   ALBUMIN 3.2* 01/04/2012 0606   AST 23 01/04/2012 0606   ALT 29 01/04/2012 0606   ALKPHOS 90 01/04/2012 0606   BILITOT 0.5 01/04/2012 0606   GFRNONAA 35* 01/29/2012 0618   GFRAA 41* 01/29/2012 0618   Sodium  Date/Time Value Range Status  01/29/2012  6:18 AM 141  135 - 145 mEq/L Final  01/25/2012 11:17 AM 138  135 - 145 mEq/L Final  01/22/2012  6:05 AM 137  135 - 145 mEq/L Final    Potassium  Date/Time Value Range Status  01/29/2012  6:18 AM 4.8  3.5 - 5.1 mEq/L Final  01/25/2012 11:17 AM 4.0  3.5 - 5.1 mEq/L Final  01/22/2012  6:05 AM 4.0  3.5 - 5.1 mEq/L Final    Phosphorus  Date/Time Value Range Status  01/29/2012  6:18 AM 3.3  2.3 - 4.6 mg/dL Final  6/57/8469  6:29 AM 2.8  2.3 - 4.6 mg/dL Final    Magnesium  Date/Time Value Range Status  01/29/2012  6:18 AM 2.7* 1.5 - 2.5 mg/dL Final  09/12/4130  4:40 AM 1.9  1.5 - 2.5 mg/dL Final   CBG (last 3)  No results found for this basename: GLUCAP:3 in the last 72 hours   Intake/Output Summary (Last 24 hours) at 02/06/12 1026 Last data filed at 02/06/12 1016  Gross per 24 hour  Intake    920 ml  Output   2750 ml  Net  -1830 ml   Weight Status:  214 lb - 10/17; finally stabilizing  Body mass index is 31.84 kg/(m^2). Obese Class I  Etimasted needs:  1875-2075 kcal, 80 -90 grams protein  Nutrition Dx:  Inadequate oral intake r/t poor attention now AEB poor meal completion and need for PEG.  Goal:  Pt to meet >/= 90% of their estimated nutrition needs - met  Monitor:  weight trends, lab trends, I/O's, PO intake, supplement tolerance, TF tolerance  Jarold Motto MS, [place], LDN Pager: [phone] After-hours pager: [phone]

## 2012-02-06 NOTE — Progress Notes (Signed)
Speech Language Pathology Daily Session Note  Patient Details  Name: Collin Diaz MRN: [id number] Date of Birth: [birth year]  Today's Date: 02/06/2012 Time: 1308-6578 Time Calculation (min): 35 min  Short Term Goals: Week 5: SLP Short Term Goal 1 (Week 5): Patient will utilize respiratory support sufficient to sustain phonation for 10 seconds with mod cues. SLP Short Term Goal 2 (Week 5): Patient will tolerate trials of Dys. 2 textures with max assist multimodal cuing for efficient mastication and timely anterior-posterior transit of bolus  SLP Short Term Goal 3 (Week 5): Patient will produce 3-5 word utterances with mod assist to increase intelligibility. SLP Short Term Goal 4 (Week 5): Patient will demonstrate improved volitional cough as a compensatory strategy for airway protection.  Skilled Therapeutic Interventions: Treatment session focused on addressing speech-language and dysphagia goals during a self-feeding task. SLP facilitated session with wait time, choice of two, and max assist verbal and visual cues to verbally express basic wants and needs throughout session. Overall patient demonstrated increased verbal output throughout session; however, diminished respiratory support greatly impacts speech intelligibility and patient often "gives up" before communicative partners can understand him.   SLP also facilitated session with supervision set up, verbal cues, and encouragement to attend to bolus during oral phase and complete a timely swallow prior to another bite. Patient consumed Dys.1 textures and nectar-thick liquids via cup with consecutive sips with no overt s/s of aspiration. Patient self-corrected large bolus size with min assist faded to supervision verbal cues.  Patient also exhibited increased function of rotary chew on thicker Dys. 1 textures (e.g. pureed meat).  As a result of observation of improved mastication, SLP recommends continued trials of Dys. 2 textures.      FIM:  Comprehension Comprehension: 5-Understands basic 90% of the time/requires cueing < 10% of the time Expression Expression Mode: Verbal Expression: 2-Expresses basic 25 - 49% of the time/requires cueing 50 - 75% of the time. Uses single words/gestures. Social Interaction Social Interaction: 4-Interacts appropriately 75 - 89% of the time - Needs redirection for appropriate language or to initiate interaction. Problem Solving Problem Solving: 3-Solves basic 50 - 74% of the time/requires cueing 25 - 49% of the time Memory Memory: 3-Recognizes or recalls 50 - 74% of the time/requires cueing 25 - 49% of the time FIM - Eating Eating Activity: 5: Supervision/cues;5: Set-up assist for open containers  Pain Pain Assessment Pain Assessment: No/denies pain  Therapy/Group: Individual Therapy  Jackalyn Lombard, Conrad Arcanum  Graduate Clinician Speech Language Pathology   Mahima Hottle, Joni Reining 02/06/2012, 2:07 PM

## 2012-02-06 NOTE — Progress Notes (Signed)
Occupational Therapy Session Note  Patient Details  Name: Collin Diaz MRN: [id number] Date of Birth: [birth year]  Today's Date: 02/06/2012 Time: 0930-1030 Time Calculation (min): 60 min  Short Term Goals: Week 5:  OT Short Term Goal 1 (Week 5): Pt will complete dynamic sitting balance for 5 mins while washing UB seated EOB. OT Short Term Goal 2 (Week 5): Pt will demonstrate initiation with slide board transfer with ability to reach towards w/c handle and assist with sliding with <25% cues. OT Short Term Goal 3 (Week 5): Pt will complete bed mobility with max assist +1 for positioning and hygiene of peri area. OT Short Term Goal 4 (Week 5): Pt will perfrom UB dressing in supportive sitting with min facilitation   Skilled Therapeutic Interventions/Progress Updates:    Pt seen for ADL retraining with focus on increased initiation, attention to task, and participation in self-care tasks of bathing and dressing.  Pt completed rolling in bed and supine to sit with +1 assist, pt assisting approx 25% in supine to sit this session.  Engaged in UB bathing seated at EOB with increased trunk control in unsupported sitting with occasional min cues to correct sitting balance.  Pt maintained static sitting balance for 10-15 mins while this therapist assisted with LB bathing and dressing.  +1 assist sit to stand to pull up pants with second person pulling up pants.  +2 assist for slide board transfer to w/c with pt assisting by reaching toward w/c arm rest and weight shifting.  Pt with increased weight shifting forward to spit with oral hygiene, requiring min-mod assist to facilitate weight shifting as opposed to max prior.  Therapy Documentation Precautions:  Precautions Precautions: Fall Precaution Comments: L hemiplegia, PEG Tube: maintain HOB at 30 degrees Restrictions Weight Bearing Restrictions: No Pain:  Pt with no c/o pain this session.  See FIM for current functional status  Therapy/Group:  Individual Therapy  Leonette Monarch 02/06/2012, 12:28 PM

## 2012-02-06 NOTE — Progress Notes (Signed)
Physical Therapy Session Note  Patient Details  Name: Collin Diaz MRN: [id number] Date of Birth: 1955/10/17  Today's Date: 02/06/2012 Time: 1530-1630 Time Calculation (min): 60 min  Short Term Goals: Week 3:  PT Short Term Goal 1 (Week 3): Patient will initiate 25% of the time for bed mobility training with use of UE support and max A PT Short Term Goal 1 - Progress (Week 3): Met PT Short Term Goal 2 (Week 3): Patient will transfer bed <> w/c with max A and initiate 25% of the time PT Short Term Goal 2 - Progress (Week 3): Partly met PT Short Term Goal 3 (Week 3): Patient will perform w/c mobility on unit x 50' with R hemi technique and 75% cues for initiation and sequencing  PT Short Term Goal 3 - Progress (Week 3): Partly met PT Short Term Goal 4 (Week 3): Patient will tolerate dynamic sitting balance and postural control training activities without back support x 15 minutes with mod-max A PT Short Term Goal 4 - Progress (Week 3): Met PT Short Term Goal 5 (Week 3): Patient will tolerate static standing in appropriate lift equipment for NMR to LLE, trunk control and pre-gait activities x 5 minutes  PT Short Term Goal 5 - Progress (Week 3): Partly met Week 4:  PT Short Term Goal 1 (Week 4): = LTG  Skilled Therapeutic Interventions/Progress Updates:   Patient resting in bed; initiated treatment session with bilat LE hamstring, glute and lower trunk rotation stretches to increase ROM for bed mobility and transfers.  Also performed bilat LE bridging in bed for LLE extensor activation and for hip repositioning with mod A.  Performed supine > L side with max A; while in sidelying performed upper trunk and pelvic dissociation stretches for increased transverse plane ROM.  Side > sit with +2A; transfer bed > w/c with squat pivot and +2 A.  Performed sit > stand at // bars x 2 reps with +2-+3 A with focus on patient initiating anterior lean sliding RUE down bar and use of trunk, hip and knee  extension for upright trunk posture; first attempt patient brought R foot across midline and unable to successfully weight shift to R or L or activate LLE extensors; second attempt block patient from moving R foot and 3rd person assisted with placement of LUE on bar to facilitate LUE and trunk elongation/extension during lateral weight shifting and forward and retro stepping of RLE with good activation of LLE extensors in stance with improved upright posture with use of mirror as visual feedback.  Therapy Documentation Precautions:  Precautions Precautions: Fall Precaution Comments: L hemiplegia, PEG Tube: maintain HOB at 30 degrees Restrictions Weight Bearing Restrictions: No Vital Signs: Therapy Vitals Temp: 99.2 F (37.3 C) Temp src: Oral Pulse Rate: 79  Resp: 18  BP: 152/107 mmHg Patient Position, if appropriate: Lying Oxygen Therapy SpO2: 97 % O2 Device: None (Room air) Pain: Pain Assessment Pain Assessment: No/denies pain Locomotion : Ambulation Ambulation/Gait Assistance: 1: +2 Total assist   See FIM for current functional status  Therapy/Group: Individual Therapy  [name] [facility] 02/06/2012, 4:45 PM

## 2012-02-06 NOTE — Progress Notes (Signed)
The skilled treatment note has been reviewed and SLP is in agreement. Anglia Blakley, M.A., CCC-SLP [phone]  

## 2012-02-06 NOTE — Progress Notes (Signed)
Patient ID: GENERAL WEARING, male   DOB: September 22, 1955, 56 y.o.   MRN: [id number] HPI: Collin Diaz is a 56 y.o. 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 Has history of poor controlled BP and had not had meds filled recently. Admitted 12/30/2011 with weakness, slurring of speech with coughing/chocking and blood pressure 254/143. Patient placed on Cardene drip. Cranial CT and imaging revealed right thalamus and basal ganglia hemorrhage with intraventricular hemorrhage and cytotoxic cerebral edema with right to left midline shift. Evaluated by neurology services with tight blood pressure control with SBP<150. Started on 3% saline drip with goal of 155-160 Na to decrease cerebral edema. Noted dysphagia currently on a dysphagia 1 diet   Subjective/Complaints:  Pt ate ~20% meals yesterday.  About fluid po 12hour TF at 65 cc /hr Severe dysarthria Review of Systems  Unable to perform ROS: medical condition  Constitutional: Positive for malaise/fatigue.  Neurological: Positive for focal weakness.     Objective: Vital Signs: Blood pressure 159/97, pulse 84, temperature 98.6 F (37 C), temperature source Oral, resp. rate 18, height 5' 8.9" (1.75 m), weight 97.5 kg (214 lb 15.2 oz), SpO2 96.00%. No results found. No results found for this or any previous visit (from the past 72 hour(s)).   HEENT: normal Cardio: RRR Resp: CTA B/L GI: BS positive, abdomen tender near tube, site appears clean with appropriate drainage Extremity:  No Edema Skin:   Intact Neuro: Flat, Cranial Nerve II-XII normal, Abnormal Sensory reduced lt touch on L,now able to identify which finger was touched Abnormal Motor o/5 on L side, sensation trace on left also. Abnormal FMC Tone  Hypotonia, Dysarthric and Aphasic Musc/Skel:  Swelling Left hand, mild pain with L shoulder  ROM  Assessment/Plan: 1. Functional deficits secondary to R thalamic hemorrhage which require 3+ hours per day of interdisciplinary therapy in a comprehensive inpatient rehab setting. Physiatrist is providing close team supervision and 24 hour management of active medical problems listed below. Physiatrist and rehab team continue to assess barriers to discharge/monitor patient progress toward functional and medical goals. FIM: FIM - Bathing Bathing Steps Patient Completed: Chest;Left Arm;Abdomen;Front perineal area;Right upper leg;Left upper leg Bathing: 3: Mod-Patient completes 5-7 79f 10 parts or 50-74%  FIM - Upper Body Dressing/Undressing Upper body dressing/undressing steps patient completed: Thread/unthread left sleeve of pullover shirt/dress;Put head through opening of pull over shirt/dress Upper body dressing/undressing: 2: Max-Patient completed 25-49% of tasks FIM - Lower Body Dressing/Undressing Lower body dressing/undressing steps patient completed: Pull pants up/down Lower body dressing/undressing: 1: Two helpers  FIM - Toileting Toileting: 1: Two helpers  FIM - Diplomatic Services operational officer Devices: Psychiatrist Transfers: 1-Two helpers  FIM - Banker Devices: Sliding board;Bed rails;Arm rests Bed/Chair Transfer: 1: Supine > Sit: Total A (helper does all/Pt. < 25%);1: Bed > Chair or W/C: Total A (helper does all/Pt. < 25%);1: Two helpers  FIM - Locomotion: Wheelchair Distance: 20 Locomotion: Wheelchair: 1: Travels less than 50 ft with moderate assistance (Pt: 50 - 74%) FIM - Locomotion: Ambulation Locomotion: Ambulation Assistive Devices: Other (comment);[place] Dec Plus (SaraPlus) Ambulation/Gait Assistance: 1: +2 Total assist Locomotion: Ambulation: 1: Two helpers  Comprehension Comprehension Mode: Auditory Comprehension: 4-Understands basic 75 - 89% of the time/requires cueing 10 - 24% of the  time  Expression Expression Mode: Verbal Expression: 2-Expresses basic 25 - 49% of the time/requires cueing 50 - 75% of  the time. Uses single words/gestures.  Social Interaction Social Interaction: 2-Interacts appropriately 25 - 49% of time - Needs frequent redirection.  Problem Solving Problem Solving: 2-Solves basic 25 - 49% of the time - needs direction more than half the time to initiate, plan or complete simple activities  Memory Memory: 3-Recognizes or recalls 50 - 74% of the time/requires cueing 25 - 49% of the time   Medical Problem List and Plan:  1. DVT Prophylaxis/Anticoagulation: Pharmaceutical: Lovenox  2. Pain Management: tylenol, can use ultram for pain  3. Mood:  low dose celexa, abilify already initiated,Apparently he had been using this as an outpt, mood/affect also flat due to effects of CVA 4. Neuropsych: This patient is not capable of making decisions on his/her own behalf Reduced intiation  Methylphenidate-will d/c and observe , no apparent change Also appetite may be supressed by this med      5. Chronic renal failure now at baseline with po, G tube fluids       6. HTN: controlled except for pm spike will change zestril to BID and monitor 7. Dysphagia: D1, nectar thick liquids -poor intake due to cognition and dysphagia- should improve with CVA recovery over the next 1-2 months  -G tube placed 10/11,mild soreness around site no signs of infection -TF's initiated.Run 12hr/day  -ultram prn for pain.  -pain improved today  8.  Post stroke seizure d/o keppra  LOS (Days) 34 A FACE TO FACE EVALUATION WAS PERFORMED  Collin Diaz 02/06/2012, 7:19 AM

## 2012-02-07 ENCOUNTER — Inpatient Hospital Stay (HOSPITAL_COMMUNITY): Payer: Medicaid Other | Admitting: Occupational Therapy

## 2012-02-07 ENCOUNTER — Inpatient Hospital Stay (HOSPITAL_COMMUNITY): Payer: Medicaid Other | Admitting: Speech Pathology

## 2012-02-07 ENCOUNTER — Inpatient Hospital Stay (HOSPITAL_COMMUNITY): Payer: Medicaid Other | Admitting: Physical Therapy

## 2012-02-07 NOTE — Progress Notes (Signed)
Physical Therapy Weekly Progress Note  Patient Details  Name: Collin Diaz MRN: [id number] Date of Birth: Jun 11, 1955  Today's Date: 02/07/2012  Patient continues to make slow but steady progress towards goals and has met 0 of 5 long term goals.  Patient is awaiting NHP but therapy continues to focus on patient attention to task, initiation and carry over of transfer sequences, decreased assistance needed for basic bed mobility and transfers and w/c mobility and standing/pre-gait training in controlled environment with lift equipment for trunk control training and WB through LUE and LLE for NMR forced use and sustained activation.  Patient is currently max-total A for bed mobility with rails, bed <> w/c transfers with squat pivot without slideboard, w/c mobility short distances, and +2A for standing but is showing improved upright posture, ability to weight shift and advance RLE with WB through LLE.    Patient continues to demonstrate the following deficits: L hemiplegia with increased tone and decreased AROM/PROM, decreased attention, arousal level, activity tolerance, initiation, apraxia, impaired motor control, timing, sequencing, impaired postural control, balance and therefore will continue to benefit from skilled PT intervention to enhance overall performance with activity tolerance, balance, postural control, ability to compensate for deficits, functional use of  left upper extremity and left lower extremity, attention and coordination.  Patient progressing toward long term goals..  Continue plan of care.  PT Short Term Goals Week 4:  PT Short Term Goal 1 (Week 4): = LTG  Therapy Documentation Precautions:  Precautions Precautions: Fall Precaution Comments: L hemiplegia, PEG Tube: maintain HOB at 30 degrees Restrictions Weight Bearing Restrictions: No  See FIM for current functional status  Therapy/Group: Weekly progress note  [name] 02/07/2012, 12:09 PM

## 2012-02-07 NOTE — Progress Notes (Signed)
Physical Therapy Session Note  Patient Details  Name: Collin Diaz MRN: [id number] Date of Birth: Nov 18, 1955  Today's Date: 02/07/2012 Time: 4696-2952 Time Calculation (min): 56 min  Short Term Goals: Week 2:  PT Short Term Goal 1 (Week 2): Patient will initiate 25% of the time for bed mobility training with use of UE support and max A PT Short Term Goal 1 - Progress (Week 2): Progressing toward goal PT Short Term Goal 2 (Week 2): Patient will transfer bed <> w/c with max A and initiate 25% of the time  PT Short Term Goal 2 - Progress (Week 2): Progressing toward goal PT Short Term Goal 3 (Week 2): Patient will perform w/c mobility on unit x 50' with R hemi technique and 75% cues for initiation and sequencing  PT Short Term Goal 3 - Progress (Week 2): Progressing toward goal PT Short Term Goal 4 (Week 2): Patient will tolerate dynamic sitting balance and postural control training activities without back support x 15 minutes with mod-max A PT Short Term Goal 4 - Progress (Week 2): Progressing toward goal PT Short Term Goal 5 (Week 2): Patient will tolerate static standing in appropriate lift equipment for NMR to LLE, trunk control and pre-gait activities x 5 minutes  PT Short Term Goal 5 - Progress (Week 2): Progressing toward goal  Skilled Therapeutic Interventions/Progress Updates:    This session focused on bed mobility, transfers (squat pivot) to and from bed to WC, and upright posture/standing in [place] with manual assist for pt to WB through left arm.  See details of treatment below.    Therapy Documentation Precautions:  Precautions Precautions: Fall Precaution Comments: L hemiplegia, PEG Tube: maintain HOB at 30 degrees Restrictions Weight Bearing Restrictions: No General:   Vital Signs: Therapy Vitals Temp: 98.9 F (37.2 C) Temp src: Oral Pulse Rate: 69  Resp: 18  BP: 160/107 mmHg Patient Position, if appropriate: Lying Oxygen Therapy SpO2: 97 % O2 Device:  None (Room air) Pain: Pain Assessment Pain Assessment: No/denies pain Pain Score: 0-No pain Mobility: Bed Mobility Rolling Right: 2: Max assist Rolling Right Details (indicate cue type and reason): max assist of legs and trunk verbal and maual cues for bent knees and hand placment on railing.   Rolling Left: 2: Max assist Rolling Left Details (indicate cue type and reason): max assist of legs and trunk verbal and maual cues for bent knees and hand placment on railing.   Left Sidelying to Sit: HOB flat;With rails;1: +2 Total assist Left Sidelying to Sit Details (indicate cue type and reason): manual assist of left leg and trunk to get to sitting with cues for hand placement on rail to push up and then manual assist and verbal cues for right hand placment on bed.   Sitting - Scoot to Edge of Bed: 2: Max assist Sitting - Scoot to [place] of Bed Details (indicate cue type and reason): max assist to help weight shift hips to EOB.   Sit to Supine: 1: +2 Total assist;HOB flat;With rail Sit to Supine - Details (indicate cue type and reason): assist needed for both legs, although he is able to initiate lifting the right into bed, unable to get it all the way in on his own, left leg and trunk needed 2 person assist to facilitate a smooth slow transition down to bed.   Transfers Squat Pivot Transfers: 1: +2 Total assist;With upper extremity assistance Squat Pivot Transfer Details (indicate cue type and reason): assist to anterirly lean  trunk for transfer verbal and manual cues for right hand placement and assist to lift hips at ischium to complete transfer toward the right from bed to WC and WC back to bed.     Locomotion : Corporate treasurer: Yes Wheelchair Assistance: 1: +1 Total assist Wheelchair Assistance Details: Verbal cues for sequencing;Verbal cues for technique;Manual facilitation for weight shifting Wheelchair Propulsion: Right upper extremity;Right lower  extremity Wheelchair Parts Management: Needs assistance Distance: 80     Other Treatments: Treatments Therapeutic Activity: Stood from [employer] in [place] ~30 seconds and then pt had incontinent episode of bladder.  He was aware he needed to go, but unable to stop the flow of urine in time.  Standing time cut short to take pt back to room to get him cleaned and in dry clothes and underware.  RN informed of incontinent episode.  Foley catheter just taken out today.   See FIM for current functional status  Therapy/Group: Individual Therapy  Lurena Joiner B. Jazyiah Yiu, PT, DPT [phone]   02/07/2012, 5:51 PM

## 2012-02-07 NOTE — Progress Notes (Signed)
Speech Language Pathology Daily Session Note  Patient Details  Name: Collin Diaz MRN: [id number] Date of Birth: March 04, 1956  Today's Date: 02/07/2012 Time: 1130-1215 Time Calculation (min): 45 min  Short Term Goals: Week 5: SLP Short Term Goal 1 (Week 5): Patient will utilize respiratory support sufficient to sustain phonation for 10 seconds with mod cues. SLP Short Term Goal 2 (Week 5): Patient will tolerate trials of Dys. 2 textures with max assist multimodal cuing for efficient mastication and timely anterior-posterior transit of bolus  SLP Short Term Goal 3 (Week 5): Patient will produce 3-5 word utterances with mod assist to increase intelligibility. SLP Short Term Goal 4 (Week 5): Patient will demonstrate improved volitional cough as a compensatory strategy for airway protection.  Skilled Therapeutic Interventions: Treatment session focused on addressing speech-language and dysphagia goals during a self-feeding task. SLP facilitated session with wait time, choice of two, and max assist verbal and visual cues to verbally express basic wants and needs throughout session.  SLP also facilitated session during a self-feeding task in a noisy, distracting environment with min-assist to supervision set up, verbal cues, and encouragement to attend to bolus during oral phase and complete a timely swallow prior to another bite.  Patient consumed Dys.1 textures and nectar-thick liquids via cup with consecutive sips with no overt s/s of aspiration.  During trials of Dys. 2 textures, patient exhibited adequate mastication of bolus with slightly prolonged A-P transit.  As a result, SLP recommends a trial meal of Dys. 2 textures to further assess mastication.     FIM:  Comprehension Comprehension Mode: Auditory Comprehension: 5-Understands basic 90% of the time/requires cueing < 10% of the time Expression Expression Mode: Verbal Expression: 2-Expresses basic 25 - 49% of the time/requires cueing 50  - 75% of the time. Uses single words/gestures. Social Interaction Social Interaction: 4-Interacts appropriately 75 - 89% of the time - Needs redirection for appropriate language or to initiate interaction. Problem Solving Problem Solving: 3-Solves basic 50 - 74% of the time/requires cueing 25 - 49% of the time Memory Memory: 3-Recognizes or recalls 50 - 74% of the time/requires cueing 25 - 49% of the time FIM - Eating Eating Activity: 5: Supervision/cues;5: Set-up assist for open containers  Pain Pain Assessment Pain Assessment: No/denies pain  Therapy/Group: Individual Therapy  Jackalyn Lombard, Conrad Waverly  Graduate Clinician Speech Language Pathology   Graeson Nouri, Joni Reining 02/07/2012, 4:27 PM

## 2012-02-07 NOTE — Progress Notes (Signed)
[name] Reports pt had episode urinary incontinence in gym approx. 3pm; No scan done at this time.  Pt incontinent large amt at 1800, scan = 52cc.  Will initiative timed toileting. Collin Diaz

## 2012-02-07 NOTE — Progress Notes (Signed)
The skilled treatment note has been reviewed and SLP is in agreement. Edder Bellanca, M.A., CCC-SLP [phone]  

## 2012-02-07 NOTE — Progress Notes (Signed)
Occupational Therapy Session Note  Patient Details  Name: Collin Diaz MRN: [id number] Date of Birth: [birth year]  Today's Date: 02/07/2012 Time: 0930-1030 Time Calculation (min): 60 min  Short Term Goals: Week 5:  OT Short Term Goal 1 (Week 5): Pt will complete dynamic sitting balance for 5 mins while washing UB seated EOB. OT Short Term Goal 2 (Week 5): Pt will demonstrate initiation with slide board transfer with ability to reach towards w/c handle and assist with sliding with <25% cues. OT Short Term Goal 3 (Week 5): Pt will complete bed mobility with max assist +1 for positioning and hygiene of peri area. OT Short Term Goal 4 (Week 5): Pt will perfrom UB dressing in supportive sitting with min facilitation   Skilled Therapeutic Interventions/Progress Updates:    Pt seen for ADL retraining with focus on initiation, sequencing, bed mobility, sitting balance, and ability to complete or advocate for himself with self-care tasks.  Completed pericare in bed with pt assisting with bed mobility and rolling, pt continues to require assistance with positioning LUE with rolling.  Required +2 supine to sit this session and +2 squat pivot from bed to [facility].  Pt reports needing to have BM.  Total assist with toileting hygiene and clothing management with +2 (second person assist with hygiene and pulling up pants while this therapist assisted pt in maintaining upright standing balance).  Pt required increased assist this session with weight shifting forward to reach to pull up pants and forward to spit when completing oral hygiene.  Therapy Documentation Precautions:  Precautions Precautions: Fall Precaution Comments: L hemiplegia, PEG Tube: maintain HOB at 30 degrees Restrictions Weight Bearing Restrictions: No General:   Vital Signs: Therapy Vitals Temp: 98.3 F (36.8 C) Temp src: Oral Pulse Rate: 80  Resp: 18  BP: 130/87 mmHg Patient Position, if appropriate: Lying Oxygen Therapy SpO2:  95 % O2 Device: None (Room air) Pain:  Pt with no c/o pain this session.  See FIM for current functional status  Therapy/Group: Individual Therapy  Leonette Monarch 02/07/2012, 10:58 AM

## 2012-02-07 NOTE — Progress Notes (Signed)
Patient ID: Collin Diaz, male   DOB: 1955-06-08, 56 y.o.   MRN: [id number] HPI: Collin Diaz is a 56 y.o. 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 Has history of poor controlled BP and had not had meds filled recently. Admitted 12/30/2011 with weakness, slurring of speech with coughing/chocking and blood pressure 254/143. Patient placed on Cardene drip. Cranial CT and imaging revealed right thalamus and basal ganglia hemorrhage with intraventricular hemorrhage and cytotoxic cerebral edema with right to left midline shift. Evaluated by neurology services with tight blood pressure control with SBP<150. Started on 3% saline drip with goal of 155-160 Na to decrease cerebral edema. Noted dysphagia currently on a dysphagia 1 diet   Subjective/Complaints:  Pt ate ~60% meals yesterday.  About fluid po 12hour TF at 65 cc /hr Severe dysarthria Review of Systems  Unable to perform ROS: medical condition  Constitutional: Positive for malaise/fatigue.  Neurological: Positive for focal weakness.     Objective: Vital Signs: Blood pressure 130/87, pulse 80, temperature 98.3 F (36.8 C), temperature source Oral, resp. rate 18, height 5' 8.9" (1.75 m), weight 100.7 kg (222 lb 0.1 oz), SpO2 95.00%. No results found. No results found for this or any previous visit (from the past 72 hour(s)).   HEENT: normal Cardio: RRR Resp: CTA B/L GI: BS positive, abdomen tender near tube, site appears clean with appropriate drainage Extremity:  No Edema Skin:   Intact Neuro: Flat, Cranial Nerve II-XII normal, Abnormal Sensory reduced lt touch on L,now able to identify which finger was touched Abnormal Motor o/5 on L side, sensation trace on left also. Abnormal FMC Tone  Hypotonia, Dysarthric and Aphasic Musc/Skel:  Swelling Left hand, mild pain with L shoulder  ROM  Assessment/Plan: 1. Functional deficits secondary to R thalamic hemorrhage which require 3+ hours per day of interdisciplinary therapy in a comprehensive inpatient rehab setting. Physiatrist is providing close team supervision and 24 hour management of active medical problems listed below. Physiatrist and rehab team continue to assess barriers to discharge/monitor patient progress toward functional and medical goals. FIM: FIM - Bathing Bathing Steps Patient Completed: Chest;Left Arm;Abdomen;Front perineal area;Right upper leg;Left upper leg Bathing: 3: Mod-Patient completes 5-7 34f 10 parts or 50-74%  FIM - Upper Body Dressing/Undressing Upper body dressing/undressing steps patient completed: Thread/unthread left sleeve of pullover shirt/dress;Put head through opening of pull over shirt/dress Upper body dressing/undressing: 3: Mod-Patient completed 50-74% of tasks FIM - Lower Body Dressing/Undressing Lower body dressing/undressing steps patient completed: Pull pants up/down Lower body dressing/undressing: 1: Two helpers  FIM - Toileting Toileting: 1: Two helpers  FIM - Diplomatic Services operational officer Devices: Psychiatrist Transfers: 1-Two helpers  FIM - Banker Devices: Arm rests Bed/Chair Transfer: 1: Supine > Sit: Total A (helper does all/Pt. < 25%);1: Bed > Chair or W/C: Total A (helper does all/Pt. < 25%);1: Chair or W/C > Bed: Total A (helper does all/Pt. < 25%);1: Two helpers  FIM - Locomotion: Wheelchair Distance: 20 Locomotion: Wheelchair: 1: Total Assistance/staff pushes wheelchair (Pt<25%) FIM - Locomotion: Ambulation Locomotion: Ambulation Assistive Devices: Parallel bars Ambulation/Gait Assistance: 1: +2 Total assist Locomotion: Ambulation: 1: Two helpers  Comprehension Comprehension Mode: Auditory Comprehension: 5-Understands basic 90% of the time/requires cueing < 10% of the  time  Expression Expression Mode: Nonverbal Expression: 1-Expresses basis less than 25% of the time/requires cueing greater than 75% of the time.  Social Interaction Social Interaction: 1-Interacts appropriately less than 25% of the time. May be withdrawn or combative.  Problem Solving Problem Solving: 3-Solves basic 50 - 74% of the time/requires cueing 25 - 49% of the time  Memory Memory: 3-Recognizes or recalls 50 - 74% of the time/requires cueing 25 - 49% of the time   Medical Problem List and Plan:  1. DVT Prophylaxis/Anticoagulation: Pharmaceutical: Lovenox  2. Pain Management: tylenol, can use ultram for pain  3. Mood:  low dose celexa, abilify already initiated,Apparently he had been using this as an outpt, mood/affect also flat due to effects of CVA 4. Neuropsych: This patient is not capable of making decisions on his/her own behalf Reduced intiation  Methylphenidate-will d/c and observe , no apparent change Also appetite may be supressed by this med      5. Chronic renal failure now at baseline with po, G tube fluids       6. HTN: controlled except for pm spike will change zestril to BID and monitor 7. Dysphagia: D1, nectar thick liquids -poor intake due to cognition and dysphagia- should improve with CVA recovery over the next 1-2 months  -G tube placed 10/11, -TF's initiated.Run 12hr/day  -ultram prn for pain.  -pain improved today  8.  Post stroke seizure d/o keppra  LOS (Days) 35 A FACE TO FACE EVALUATION WAS PERFORMED  [name],[name] 02/07/2012, 9:28 AM

## 2012-02-07 NOTE — Progress Notes (Signed)
Foley d/c'd at 1330 without incident.Collin Diaz

## 2012-02-08 ENCOUNTER — Inpatient Hospital Stay (HOSPITAL_COMMUNITY): Payer: Medicaid Other | Admitting: *Deleted

## 2012-02-08 ENCOUNTER — Inpatient Hospital Stay (HOSPITAL_COMMUNITY): Payer: Medicaid Other | Admitting: Speech Pathology

## 2012-02-08 ENCOUNTER — Inpatient Hospital Stay (HOSPITAL_COMMUNITY): Payer: Medicaid Other | Admitting: Occupational Therapy

## 2012-02-08 DIAGNOSIS — Z5189 Encounter for other specified aftercare: Secondary | ICD-10-CM

## 2012-02-08 DIAGNOSIS — I619 Nontraumatic intracerebral hemorrhage, unspecified: Secondary | ICD-10-CM

## 2012-02-08 DIAGNOSIS — G8110 Spastic hemiplegia affecting unspecified side: Secondary | ICD-10-CM

## 2012-02-08 DIAGNOSIS — I69991 Dysphagia following unspecified cerebrovascular disease: Secondary | ICD-10-CM

## 2012-02-08 DIAGNOSIS — I69998 Other sequelae following unspecified cerebrovascular disease: Secondary | ICD-10-CM

## 2012-02-08 DIAGNOSIS — R209 Unspecified disturbances of skin sensation: Secondary | ICD-10-CM

## 2012-02-08 MED ORDER — DOXAZOSIN MESYLATE 8 MG PO TABS
8.0000 mg | ORAL_TABLET | Freq: Every day | ORAL | Status: DC
  Administered 2012-02-08 – 2012-02-11 (×4): 8 mg via ORAL
  Filled 2012-02-08 (×5): qty 1

## 2012-02-08 NOTE — Progress Notes (Signed)
Physical Therapy Session Note  Patient Details  Name: Collin Diaz MRN: [id number] Date of Birth: 05-17-1955  Today's Date: 02/08/2012 Time: 4098-1191 Time Calculation (min): 60 min  Short Term Goals: Week 3:  PT Short Term Goal 1 (Week 3): Patient will initiate 25% of the time for bed mobility training with use of UE support and max A PT Short Term Goal 1 - Progress (Week 3): Met PT Short Term Goal 2 (Week 3): Patient will transfer bed <> w/c with max A and initiate 25% of the time PT Short Term Goal 2 - Progress (Week 3): Partly met PT Short Term Goal 3 (Week 3): Patient will perform w/c mobility on unit x 50' with R hemi technique and 75% cues for initiation and sequencing  PT Short Term Goal 3 - Progress (Week 3): Partly met PT Short Term Goal 4 (Week 3): Patient will tolerate dynamic sitting balance and postural control training activities without back support x 15 minutes with mod-max A PT Short Term Goal 4 - Progress (Week 3): Met PT Short Term Goal 5 (Week 3): Patient will tolerate static standing in appropriate lift equipment for NMR to LLE, trunk control and pre-gait activities x 5 minutes  PT Short Term Goal 5 - Progress (Week 3): Partly met      Therapy Documentation Precautions:  Precautions Precautions: Fall Precaution Comments: L hemiplegia, PEG Tube: maintain HOB at 30 degrees Restrictions Weight Bearing Restrictions: No   Pain: Pain Assessment Pain Assessment: No/denies pain Pain Score: 0-No pain   Other Treatments:  Patient lying in bed upon PT arrival. PT performed passive hamstring, piriformis, and low back stretches x 1 min each, bilaterally. Patient continues to present with significant tightness in these muscle groups.  Patient performed bilaterally rolling requiring max A and max vcs for initiation and sequencing. Patient performed supine to sit from L sidelying. Patient required max A of one helper at the trunk and hips; patient able to bring BLEs off  bed using RLE to hook LLE in compensatory strategy.  Patient performed w/c propulsion x 20 feet with mod A for sequencing and RLE propulsion. Once in gym, patient performed sit > stand transfer, total A of two helpers, to high/low mat with emphasis on standing tolerance, postural control, weightbearing throughout LEs, bilateral weight shifting, and forced use of LLE. Patient able to stand for ~5 min, prior to needing seated rest break. Patient continues to have difficulty standing with upright posture, resulting in trunk flexion. Verbal and tactile cues provided, however patient unable to sustain position. Will continue to progress as tolerated. Patient verbalized having to go to bathroom, and was taken to room by PT in w/c. Patient was incontinent of bowel prior to transferring to bedside commode. PT assisted nurse tech in cleaning patient. Patient left in bed with call bell reach.  See FIM for current functional status  Therapy/Group: Individual Therapy  Varetta Chavers [place] DPT Student  02/08/2012, 3:56 PM

## 2012-02-08 NOTE — Progress Notes (Signed)
The skilled treatment note has been reviewed and SLP is in agreement. Daivd Fredericksen, M.A., CCC-SLP [phone]  

## 2012-02-08 NOTE — Progress Notes (Signed)
I have read and agree with treatment note.  Toretto Tingler, PT  

## 2012-02-08 NOTE — Progress Notes (Signed)
Speech Language Pathology Daily Session Note  Patient Details  Name: Collin Diaz MRN: [id number] Date of Birth: 11/08/1955  Today's Date: 02/08/2012 Time: 1130-1215 Time Calculation (min): 45 min  Short Term Goals: Week 5: SLP Short Term Goal 1 (Week 5): Patient will utilize respiratory support sufficient to sustain phonation for 10 seconds with mod cues. SLP Short Term Goal 2 (Week 5): Patient will tolerate trials of Dys. 2 textures with max assist multimodal cuing for efficient mastication and timely anterior-posterior transit of bolus  SLP Short Term Goal 3 (Week 5): Patient will produce 3-5 word utterances with mod assist to increase intelligibility. SLP Short Term Goal 4 (Week 5): Patient will demonstrate improved volitional cough as a compensatory strategy for airway protection.  Skilled Therapeutic Interventions: Treatment session focused on addressing speech-language and dysphagia goals during a self-feeding task. SLP facilitated session with wait time, choice of two, and mod assist verbal and visual cues to verbally express basic wants and needs throughout session, although patient still exhibits decreased speech intelligibility resulting from diminished respiratory support. SLP also facilitated session during a self-feeding task in a distracting environment with min-assist verbal cues to efficiently masticate Dys. 2 textures and initiate timely swallow. Patient coughed x1 on textures when consuming Dys. 2 textures and nectar-thick liquids via cup which SLP suspects was caused by pharyngeal residue. Strong reflexive cough appeared to clear penetrates.  As a result, SLP recommends diet upgrade to Dys. 2 textures.      FIM:  Comprehension Comprehension Mode: Auditory Comprehension: 5-Understands basic 90% of the time/requires cueing < 10% of the time Expression Expression Mode: Verbal Expression: 2-Expresses basic 25 - 49% of the time/requires cueing 50 - 75% of the time. Uses  single words/gestures. Social Interaction Social Interaction: 4-Interacts appropriately 75 - 89% of the time - Needs redirection for appropriate language or to initiate interaction. Problem Solving Problem Solving: 4-Solves basic 75 - 89% of the time/requires cueing 10 - 24% of the time Memory Memory: 3-Recognizes or recalls 50 - 74% of the time/requires cueing 25 - 49% of the time FIM - Eating Eating Activity: 5: Supervision/cues  Pain Pain Assessment Pain Assessment: No/denies pain Pain Score: 0-No pain  Therapy/Group: Individual Therapy  Jackalyn Lombard, Conrad   Graduate Clinician Speech Language Pathology   Zniyah Midkiff, Joni Reining 02/08/2012, 3:55 PM

## 2012-02-08 NOTE — Progress Notes (Signed)
Occupational Therapy Session Note  Patient Details  Name: Collin Diaz MRN: [id number] Date of Birth: 05/30/1955  Today's Date: 02/08/2012 Time: 0930-1030 Time Calculation (min): 60 min  Short Term Goals: Week 5:  OT Short Term Goal 1 (Week 5): Pt will complete dynamic sitting balance for 5 mins while washing UB seated EOB. OT Short Term Goal 2 (Week 5): Pt will demonstrate initiation with slide board transfer with ability to reach towards w/c handle and assist with sliding with <25% cues. OT Short Term Goal 3 (Week 5): Pt will complete bed mobility with max assist +1 for positioning and hygiene of peri area. OT Short Term Goal 4 (Week 5): Pt will perfrom UB dressing in supportive sitting with min facilitation   Skilled Therapeutic Interventions/Progress Updates:    Pt seen for ADL retraining with focus on initiation, sequencing, bed mobility, sitting balance, and ability to complete or advocate for himself with self-care tasks. Completed pericare in bed with pt assisting with bed mobility and rolling, pt continues to require assistance with positioning LUE with rolling. Required +2 supine to sit and slide board transfer to w/c.  Pt with decreased sitting balance this session, requiring intermittent tactile cues to maintain upright posture progressing to as much as mod assist.  Completed bathing and dressing with pt initiating reaching forward for pants, but unable to weight shift to don pants.  +2 for standing to pull up pants.  Nearing end of session, pt reports needing to have BM. Total assist with toileting hygiene and clothing management with +2 (second person assist with hygiene and pulling up pants while this therapist assisted pt in maintaining upright standing balance).   Therapy Documentation Precautions:  Precautions Precautions: Fall Precaution Comments: L hemiplegia, PEG Tube: maintain HOB at 30 degrees Restrictions Weight Bearing Restrictions: No Pain:  Pt with no c/o  pain this session.  See FIM for current functional status  Therapy/Group: Individual Therapy  Leonette Monarch 02/08/2012, 12:01 PM

## 2012-02-08 NOTE — Progress Notes (Signed)
Patient ID: Collin Diaz, male   DOB: [birth year], 56 y.o.   MRN: [id number] HPI: Collin Diaz is a 56 y.o. 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 Has history of poor controlled BP and had not had meds filled recently. Admitted 12/30/2011 with weakness, slurring of speech with coughing/chocking and blood pressure 254/143. Patient placed on Cardene drip. Cranial CT and imaging revealed right thalamus and basal ganglia hemorrhage with intraventricular hemorrhage and cytotoxic cerebral edema with right to left midline shift. Evaluated by neurology services with tight blood pressure control with SBP<150. Started on 3% saline drip with goal of 155-160 Na to decrease cerebral edema. Noted dysphagia currently on a dysphagia 1 diet   Subjective/Complaints: Foley removed lg inc void with PVR 52cc Severe dysarthria Review of Systems  Unable to perform ROS: medical condition  Constitutional: Positive for malaise/fatigue.  Neurological: Positive for focal weakness.     Objective: Vital Signs: Blood pressure 151/91, pulse 73, temperature 98.8 F (37.1 C), temperature source Oral, resp. rate 18, height 5' 8.9" (1.75 m), weight 97 kg (213 lb 13.5 oz), SpO2 98.00%. No results found. No results found for this or any previous visit (from the past 72 hour(s)).   HEENT: normal Cardio: RRR Resp: CTA B/L GI: BS positive, abdomen tender near tube, site appears clean with appropriate drainage Extremity:  No Edema Skin:   Intact Neuro: Flat, Cranial Nerve II-XII normal, Abnormal Sensory reduced lt touch on L,now able to identify which finger was touched Abnormal Motor o/5 on L side, sensation trace on left also. Abnormal FMC Tone  Hypotonia, Dysarthric and Aphasic Musc/Skel:  Swelling Left hand, mild pain with L shoulder ROM  Assessment/Plan: 1. Functional deficits  secondary to R thalamic hemorrhage which require 3+ hours per day of interdisciplinary therapy in a comprehensive inpatient rehab setting. Physiatrist is providing close team supervision and 24 hour management of active medical problems listed below. Physiatrist and rehab team continue to assess barriers to discharge/monitor patient progress toward functional and medical goals. FIM: FIM - Bathing Bathing Steps Patient Completed: Chest;Left Arm;Abdomen;Front perineal area;Right upper leg;Left upper leg Bathing: 3: Mod-Patient completes 5-7 33f 10 parts or 50-74%  FIM - Upper Body Dressing/Undressing Upper body dressing/undressing steps patient completed: Thread/unthread left sleeve of pullover shirt/dress;Put head through opening of pull over shirt/dress;Thread/unthread right sleeve of pullover shirt/dresss Upper body dressing/undressing: 3: Mod-Patient completed 50-74% of tasks FIM - Lower Body Dressing/Undressing Lower body dressing/undressing steps patient completed: Pull pants up/down Lower body dressing/undressing: 1: Two helpers  FIM - Toileting Toileting: 1: Two helpers  FIM - Diplomatic Services operational officer Devices: Psychiatrist Transfers: 1-Two helpers  FIM - Banker Devices: Bed rails;Arm rests;Sliding board Bed/Chair Transfer: 2: Supine > Sit: Max A (lifting assist/Pt. 25-49%);1: Bed > Chair or W/C: Total A (helper does all/Pt. < 25%);1: Two helpers  FIM - Locomotion: Wheelchair Distance: 80 Locomotion: Wheelchair: 1: Total Assistance/staff pushes wheelchair (Pt<25%) FIM - Locomotion: Ambulation Locomotion: Ambulation Assistive Devices: Parallel bars Ambulation/Gait Assistance: 1: +2 Total assist Locomotion: Ambulation: 0: Activity did not occur  Comprehension Comprehension Mode: Auditory Comprehension: 5-Understands basic 90% of the time/requires cueing < 10% of the time  Expression Expression Mode:  Verbal Expression: 2-Expresses basic 25 - 49% of the time/requires cueing 50 - 75% of the time. Uses single words/gestures.  Social Interaction Social Interaction: 4-Interacts appropriately 75 - 89% of  the time - Needs redirection for appropriate language or to initiate interaction.  Problem Solving Problem Solving: 3-Solves basic 50 - 74% of the time/requires cueing 25 - 49% of the time  Memory Memory: 3-Recognizes or recalls 50 - 74% of the time/requires cueing 25 - 49% of the time   Medical Problem List and Plan:  1. DVT Prophylaxis/Anticoagulation: Pharmaceutical: Lovenox  2. Pain Management: tylenol, can use ultram for pain , use Kpad for back pain 3. Mood:  low dose celexa, abilify already initiated,Apparently he had been using this as an outpt, mood/affect also flat due to effects of CVA 4. Neuropsych: This patient is not capable of making decisions on his/her own behalf Reduced intiation  Methylphenidate-will d/c and observe , no apparent change Also appetite may be supressed by this med      5. Chronic renal failure now at baseline with po, G tube fluids       6. HTN: uncontrolled increase cardura 7. Dysphagia: D1, nectar thick liquids -poor intake due to cognition and dysphagia- should improve with CVA recovery over the next 1-2 months  -G tube placed 10/11, -TF's bolus at hs and after meals if <50% meal consumed  -ultram prn for pain.  -pain improved today  8.  Post stroke seizure d/o keppra  LOS (Days) 36 A FACE TO FACE EVALUATION WAS PERFORMED  Davine Sweney E 02/08/2012, 7:45 AM

## 2012-02-08 NOTE — Patient Care Conference (Signed)
Inpatient RehabilitationTeam Conference Note Date: 02/07/2012   Time: 10:40 AM    Patient Name: Collin Diaz      Medical Record Number: [id number]  Date of Birth: 03-02-1956 Sex: Male         Room/Bed: 4032/4032-01 Payor Info: Payor: MEDICAID [place]  Plan: MEDICAID OF [place]  Product Type: *No Product type*     Admitting Diagnosis: ICH  Admit Date/Time:  01/03/2012  4:50 PM Admission Comments: No comment available   Primary Diagnosis:  Thalamic hemorrhage Principal Problem: Thalamic hemorrhage  Patient Active Problem List   Diagnosis Date Noted  . Syncope 01/09/2012  . Induced hypernatremia 01/03/2012  . Obesity (BMI 30.0-34.9) 01/03/2012  . OSA (obstructive sleep apnea) 01/03/2012  . Renal insufficiency 01/03/2012  . Chronic ischemic left MCA stroke 01/03/2012  . Aphasia as late effect of cerebrovascular accident 01/03/2012  . Depression 01/03/2012  . 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 ALLERGIC RHINITIS 02/07/2007  . FACIAL RASH 02/07/2007  . GOUT NOS 08/18/2006  . Malignant hypertension 08/18/2006  . KIDNEY DISEASE, CHRONIC, STAGE V 08/18/2006    Expected Discharge Date: Expected Discharge Date:  (SNF)  Team Members Present: Physician: Dr. Claudette Laws Social Worker Present: Amada Jupiter, LCSW Nurse Present: Carlean Purl, RN PT Present: [name], PT OT Present: Bretta Bang, Verlene Mayer, OT SLP Present: Fae Pippin, SLP Other (Discipline and Name): Tora Duck,  PPS Coordinator     Current Status/Progress Goal Weekly Team Focus  Medical   PEG functioning well,takes po inconsistantly  reduce PEG feeds depending on PO  improve consistancy of po feeds   Bowel/Bladder   continent bowel; foley at present  Continent of bowel   Bowel program   Swallow/Nutrition/ Hydration   Dys. 1 textures and nectar thick liquids; PEG placed  least restrictive p.o. intake   continue trials of Dys.2    ADL's   mod assist bathing, mod assist grooming, mod assist UB  dressing, +1 sit to stand, +2 LB dressing and transfers  downgraded goals: mod UB bathing and dressing, max assist LB bathing and dressing and transfers  trunk control with static and dynamic sitting, upright static standing, increased initiation and follow through of tasks   Mobility   max-total A; improved initiation and activation of LLE in standing  min A w/c mobility, mod-max A for bed mobility, transfers  decreasing assistance for transfers, postural control/balance, standing   Communication   mod-max assist   mod assist   increase initiation and sustain attention and breath support   Safety/Cognition/ Behavioral Observations  mod-max assist   mod assist   increase attention and initiation    Pain   scheduled tylenol effective for occasional c/o LLE discomfort  pain level < or = 3  Monitor   Skin   Dry skin BLE/ eucerin come in use  No skin issues  Apply eucerin cream as order, Q4 turn ( to prevent breakdown)      *See Interdisciplinary Assessment and Plan and progress notes for long and short-term goals  Barriers to Discharge: no 24/7 care that is reliable    Possible Resolutions to Barriers:  SNF    Discharge Planning/Teaching Needs:  Pursuing NHP awaiting bed offer.  Pts Disability approved and Home-Medicaid approved.  Wife has not been here this week.      Team Discussion:  Making progress with transfers.  Voiding trial and d/c foley-cont of bowel.  Still looking for NH bed  Revisions to Treatment Plan:  NHP   Continued  Need for Acute Rehabilitation Level of Care: The patient requires daily medical management by a physician with specialized training in physical medicine and rehabilitation for the following conditions: Daily direction of a multidisciplinary physical rehabilitation program to ensure safe treatment while eliciting the highest outcome that is of practical value to the patient.: Yes Daily medical management of patient stability for increased activity during  participation in an intensive rehabilitation regime.: Yes Daily analysis of laboratory values and/or radiology reports with any subsequent need for medication adjustment of medical intervention for : Neurological problems;Other  Promise Bushong, [name] 02/08/2012, 9:01 AM

## 2012-02-09 ENCOUNTER — Inpatient Hospital Stay (HOSPITAL_COMMUNITY): Payer: Medicaid Other | Admitting: Occupational Therapy

## 2012-02-09 ENCOUNTER — Inpatient Hospital Stay (HOSPITAL_COMMUNITY): Payer: Medicaid Other | Admitting: Speech Pathology

## 2012-02-09 ENCOUNTER — Inpatient Hospital Stay (HOSPITAL_COMMUNITY): Payer: Medicaid Other | Admitting: Physical Therapy

## 2012-02-09 DIAGNOSIS — G8110 Spastic hemiplegia affecting unspecified side: Secondary | ICD-10-CM

## 2012-02-09 DIAGNOSIS — I619 Nontraumatic intracerebral hemorrhage, unspecified: Secondary | ICD-10-CM

## 2012-02-09 DIAGNOSIS — I69991 Dysphagia following unspecified cerebrovascular disease: Secondary | ICD-10-CM

## 2012-02-09 DIAGNOSIS — I69998 Other sequelae following unspecified cerebrovascular disease: Secondary | ICD-10-CM

## 2012-02-09 DIAGNOSIS — R209 Unspecified disturbances of skin sensation: Secondary | ICD-10-CM

## 2012-02-09 LAB — BASIC METABOLIC PANEL
CO2: 25 mEq/L (ref 19–32)
Chloride: 99 mEq/L (ref 96–112)
Glucose, Bld: 107 mg/dL — ABNORMAL HIGH (ref 70–99)
Potassium: 4.2 mEq/L (ref 3.5–5.1)
Sodium: 136 mEq/L (ref 135–145)

## 2012-02-09 MED ORDER — ACETAMINOPHEN 325 MG PO TABS
650.0000 mg | ORAL_TABLET | Freq: Two times a day (BID) | ORAL | Status: DC
  Administered 2012-02-09 – 2012-02-12 (×7): 650 mg via ORAL
  Filled 2012-02-09 (×3): qty 2

## 2012-02-09 MED ORDER — ACETAMINOPHEN 325 MG PO TABS: 650.0000 mg | ORAL_TABLET | Freq: Two times a day (BID) | ORAL | Status: DC

## 2012-02-09 NOTE — Progress Notes (Signed)
Pt's bp=146/105, prn med was notified from pharmacy and pt was given scheduled lisinopril 2.5mg ---Will continue to monitor pt.---Mairen Wallenstein, rn

## 2012-02-09 NOTE — Progress Notes (Signed)
Physical Therapy Discharge Summary  Patient Details  Name: Collin Diaz MRN: [id number] Date of Birth: 1955-05-27  Today's Date: 02/12/2012  Patient has met 2 of 5 long term goals due to improved activity tolerance, improved balance, improved postural control, increased strength, increased range of motion, functional use of  left lower extremity and improved attention.  Patient to discharge at a wheelchair level Max Assist-total A at times secondary to fluctuating attention/arousal levels.  Patient has made very slow progress towards PT LTG and is currently max-total A for bed mobility with rails secondary to poor initiation and decreased trunk ROM and strength, max-total A bed <> w/c transfer with squat pivot, w/c mobility in controlled environment with , static standing with lift equipment (total A) for LLE WB, sustained activation training and trunk control training.   Patient's care partner (wife) is unable  to provide the necessary physical, cognitive and supervision assistance at discharge and agrees that patient would benefit from more prolonged rehabilitation to reach higher level of function prior to D/C home to increased safety and minimize caregiver burden of care.  Reasons goals not met: Sitting balance with consistent min A, bed mobility with mod A and w/c mobility in controlled environment with min A goals not met but adequate for D/C secondary to patient being D/C to SNF for continued therapy and functional mobility training.  Recommendation:  Patient will benefit from ongoing skilled PT services in skilled nursing facility setting to continue to advance safe functional mobility, address ongoing impairments in initiation, attention, activity tolerance, L hemiplegia, increased tone, decreased AROM, apraxia, postural control, dynamic sitting balance, static standing balance, gait, and minimize fall risk.  Equipment: Deferred to next venue  Reasons for discharge: discharge from  hospital  Patient/family agrees with progress made and goals achieved: Yes  See FIM for current functional status  [name] [facility] 02/12/2012, 8:52 AM

## 2012-02-09 NOTE — Progress Notes (Signed)
Physical Therapy Session Note  Patient Details  Name: Collin Diaz MRN: [id number] Date of Birth: 23-Dec-1955  Today's Date: 02/09/2012 Time: 1430-1530 Time Calculation (min): 60 min  Short Term Goals: Week 3:  PT Short Term Goal 1 (Week 3): Patient will initiate 25% of the time for bed mobility training with use of UE support and max A PT Short Term Goal 1 - Progress (Week 3): Met PT Short Term Goal 2 (Week 3): Patient will transfer bed <> w/c with max A and initiate 25% of the time PT Short Term Goal 2 - Progress (Week 3): Partly met PT Short Term Goal 3 (Week 3): Patient will perform w/c mobility on unit x 50' with R hemi technique and 75% cues for initiation and sequencing  PT Short Term Goal 3 - Progress (Week 3): Partly met PT Short Term Goal 4 (Week 3): Patient will tolerate dynamic sitting balance and postural control training activities without back support x 15 minutes with mod-max A PT Short Term Goal 4 - Progress (Week 3): Met PT Short Term Goal 5 (Week 3): Patient will tolerate static standing in appropriate lift equipment for NMR to LLE, trunk control and pre-gait activities x 5 minutes  PT Short Term Goal 5 - Progress (Week 3): Partly met   Therapy Documentation Precautions:  Precautions Precautions: Fall Precaution Comments: L hemiplegia, PEG Tube: maintain HOB at 30 degrees Restrictions Weight Bearing Restrictions: No   Vital Signs: Therapy Vitals Temp: 98.3 F (36.8 C) Temp src: Oral Pulse Rate: 82  Resp: 18  BP: 160/109 mmHg Patient Position, if appropriate: Lying Oxygen Therapy SpO2: 97 % O2 Device: None (Room air) Pain: Pain Assessment Pain Assessment: No/denies pain Other Treatments:   Patient sitting in chair upon PT arrival. Patient propelled from room > nurses station (~50 feet) using R hemi technique and mod A at RLE. Patient requires increased time to perform activity and vcs for initiation, sequencing, and technique.  Patient performed sit  <> at high/low mat with total assist of two helpers. Patient maintained standing at high/low mat with max assist of two for ~5 min x 2 reps with emphasis on standing tolerance, postural control, LE weightbearing, forced use of LLE and maintaining balance. Patient performed lateral weight shifts in standing, requiring max verbal and tactile cues for posture, LLE control. Patient continues to have difficulty standing with erect posture secondary to anterior trunk lean in standing. Upon second stand, patient's RLE was abducted resulting in patient leaning to L. Patient unable to correct with verbal and tactile cues and forced to return to seated position early due to being unable to maintain standing posture. Patient required sitting rest break in between reps.  Patient returned to room in w/c. Patient performed w/c > bed squat pivot transfer to the L with total assist of two helpers. Patient performed sit > supine transfer with total assist of two helpers. Patient left lying in bed with call bell in reach.   See FIM for current functional status  Therapy/Group: Individual Therapy  Darrelle Wiberg [place] DPT Student  02/09/2012, 4:50 PM

## 2012-02-09 NOTE — Progress Notes (Signed)
Social Work Patient ID: Collin Diaz, male   DOB: [birth year], 56 y.o.   MRN: [id number] Spoke with [facility] who has offered a bed for Monday due to needing to order tube feeding.  Wife is here and met with her to inform of offer. She will plan to meet him over there on Monday-around 1:00 PM.  Will touch base with [facility] Monday and prepare for transfer.  Pt was in on the [facility] and had no response.  Have copy of his Medicaid card wife received in the mail.  Have emergency contact for wife since her cell phone has been disconnected. It is her son-James Pricilla Holm [phone].  Follow up with on Monday.  Pam-PA aware of need for discharge summary.

## 2012-02-09 NOTE — Progress Notes (Signed)
Patient ID: Collin Diaz, male   DOB: 02/23/1956, 56 y.o.   MRN: [id number] HPI: Collin Diaz is a 56 y.o. 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 Has history of poor controlled BP and had not had meds filled recently. Admitted 12/30/2011 with weakness, slurring of speech with coughing/chocking and blood pressure 254/143. Patient placed on Cardene drip. Cranial CT and imaging revealed right thalamus and basal ganglia hemorrhage with intraventricular hemorrhage and cytotoxic cerebral edema with right to left midline shift. Evaluated by neurology services with tight blood pressure control with SBP<150. Started on 3% saline drip with goal of 155-160 Na to decrease cerebral edema. Noted dysphagia currently on a dysphagia 1 diet   Subjective/Complaints: Foley removed lg inc void with PVR 52cc Severe dysarthria Review of Systems  Unable to perform ROS: medical condition  Constitutional: Positive for malaise/fatigue.  Neurological: Positive for focal weakness.     Objective: Vital Signs: Blood pressure 146/105, pulse 88, temperature 98.5 F (36.9 C), temperature source Oral, resp. rate 19, height 5' 8.9" (1.75 m), weight 99.7 kg (219 lb 12.8 oz), SpO2 94.00%. No results found. No results found for this or any previous visit (from the past 72 hour(s)).   HEENT: normal Cardio: RRR Resp: CTA B/L GI: BS positive, abdomen tender near tube, site appears clean with appropriate drainage Extremity:  No Edema Skin:   Intact Neuro: Flat, Cranial Nerve II-XII normal, Abnormal Sensory reduced lt touch on L,now able to identify which finger was touched Abnormal Motor o/5 on L side, sensation trace on left also. Abnormal FMC Tone  Hypotonia, Dysarthric and Aphasic Musc/Skel:  Swelling Left hand, mild pain with L shoulder ROM  Assessment/Plan: 1. Functional deficits  secondary to R thalamic hemorrhage medically stable for transfer to SNF today       FIM: FIM - Bathing Bathing Steps Patient Completed: Chest;Left Arm;Abdomen;Front perineal area;Right upper leg;Left upper leg Bathing: 3: Mod-Patient completes 5-7 32f 10 parts or 50-74%  FIM - Upper Body Dressing/Undressing Upper body dressing/undressing steps patient completed: Thread/unthread left sleeve of pullover shirt/dress;Put head through opening of pull over shirt/dress Upper body dressing/undressing: 2: Max-Patient completed 25-49% of tasks FIM - Lower Body Dressing/Undressing Lower body dressing/undressing steps patient completed: Pull pants up/down Lower body dressing/undressing: 1: Two helpers  FIM - Toileting Toileting: 1: Two helpers  FIM - Diplomatic Services operational officer Devices: Psychiatrist Transfers: 1-Two helpers  FIM - Banker Devices: HOB elevated;Arm rests;Bed rails Bed/Chair Transfer: 2: Supine > Sit: Max A (lifting assist/Pt. 25-49%);1: Two helpers  FIM - Locomotion: Wheelchair Distance: 80 Locomotion: Wheelchair: 1: Travels less than 50 ft with moderate assistance (Pt: 50 - 74%) FIM - Locomotion: Ambulation Locomotion: Ambulation Assistive Devices: Parallel bars Ambulation/Gait Assistance: 1: +2 Total assist Locomotion: Ambulation: 0: Activity did not occur  Comprehension Comprehension Mode: Auditory Comprehension: 5-Understands basic 90% of the time/requires cueing < 10% of the time  Expression Expression Mode: Verbal Expression: 2-Expresses basic 25 - 49% of the time/requires cueing 50 - 75% of the time. Uses single words/gestures.  Social Interaction Social Interaction: 4-Interacts appropriately 75 - 89% of the time - Needs redirection for appropriate language or to initiate interaction.  Problem Solving Problem Solving: 4-Solves basic 75 - 89% of the time/requires cueing 10 - 24% of the  time  Memory Memory: 3-Recognizes or recalls 50 - 74% of the time/requires cueing  25 - 49% of the time   Medical Problem List and Plan:  1. DVT Prophylaxis/Anticoagulation: Pharmaceutical: Lovenox  2. Pain Management: tylenol, can use ultram for pain , use Kpad for back pain 3. Mood:  low dose celexa, abilify already initiated,Apparently he had been using this as an outpt, mood/affect also flat due to effects of CVA 4. Neuropsych: This patient is not capable of making decisions on his/her own behalf 5.Reduced intiation  Methylphenidate-will d/c and observe , no apparent change  Appetite improved.  6. Chronic renal failure now at baseline with po, G tube fluids       7.  HTN: uncontrolled increased cardura 10/23 pm. 8. Dysphagia: D2, nectar thick liquids -poor intake due to cognition and dysphagia- should improve with CVA recovery over the next 1-2 months  -G tube placed 10/11, -TF's bolus at hs and after meals if <50% meal consumed  -tylenol schedule prior to therapies with prn ultram for pain. K pad prn for back pain. .  -pain improving. 9.  Post stroke seizure d/o keppra 10. Acute on chronic renal insufficiency:  Recheck today LOS (Days) 37 A FACE TO FACE EVALUATION WAS PERFORMED  Love, Evlyn Kanner 02/09/2012, 8:53 AM

## 2012-02-09 NOTE — Progress Notes (Signed)
Occupational Therapy Session Note  Patient Details  Name: Collin Diaz MRN: [id number] Date of Birth: 12-03-1955  Today's Date: 02/09/2012 Time: 0900-1000 Time Calculation (min): 60 min  Short Term Goals: Week 5:  OT Short Term Goal 1 (Week 5): Pt will complete dynamic sitting balance for 5 mins while washing UB seated EOB. OT Short Term Goal 2 (Week 5): Pt will demonstrate initiation with slide board transfer with ability to reach towards w/c handle and assist with sliding with <25% cues. OT Short Term Goal 3 (Week 5): Pt will complete bed mobility with max assist +1 for positioning and hygiene of peri area. OT Short Term Goal 4 (Week 5): Pt will perfrom UB dressing in supportive sitting with min facilitation   Skilled Therapeutic Interventions/Progress Updates:    Pt seen for ADL retraining with focus on bed mobility, initiation, sequencing, and increased participation in self-care tasks of bathing and dressing.  Pt sitting up in bed and smiled when greeted this AM and reports ready to participate.  Pt demonstrated increased participation in rolling for hygiene of periarea with reaching for bed rails and use of RLE to assist in rolling.  Pt max assist +1 sidelying to sit with facilitation at trunk.  Pt demonstrated initiation with bathing by reporting what areas needed to be washed and reaching for wash cloth in basin.  Pt with increased attention to detail with washing underarms.  Pt continues to fluctuate with sitting balance, depending on the demand presented and distractions.  Continues to lose balance to Lt and posteriorly.  Pt with increased follow through with donning shirt.  Engaged in sit to stand with +1 assist to come up to standing, however required +2 to complete LB dressing.  +2 assist with slide board to Rt from bed to w/c, with pt shoeing increased participation with reaching for chair and arm rest to assist in transfer.  Pt completed grooming in sitting at sink with min cues to  obtain items with focus on weight shifting forward to obtain items and spit as needed, pt with increased forward weight shift this session requiring min assist.  Therapy Documentation Precautions:  Precautions Precautions: Fall Precaution Comments: L hemiplegia, PEG Tube: maintain HOB at 30 degrees Restrictions Weight Bearing Restrictions: No General:   Vital Signs: Therapy Vitals BP: 146/96 mmHg Pain: Pain Assessment Pain Assessment: No/denies pain Pain Score: 0-No pain  See FIM for current functional status  Therapy/Group: Individual Therapy  Leonette Monarch 02/09/2012, 12:14 PM

## 2012-02-09 NOTE — Progress Notes (Signed)
Speech Language Pathology Daily Session Note & Discharge Summary  Patient Details  Name: Collin Diaz MRN: [id number] Date of Birth: 02/04/1956  Today's Date: 02/09/2012 Time: 4098-1191 Time Calculation (min): 40 min  Short Term Goals: Week 5: SLP Short Term Goal 1 (Week 5): Patient will utilize respiratory support sufficient to sustain phonation for 10 seconds with mod cues. SLP Short Term Goal 2 (Week 5): Patient will tolerate trials of Dys. 2 textures with max assist multimodal cuing for efficient mastication and timely anterior-posterior transit of bolus  SLP Short Term Goal 3 (Week 5): Patient will produce 3-5 word utterances with mod assist to increase intelligibility. SLP Short Term Goal 4 (Week 5): Patient will demonstrate improved volitional cough as a compensatory strategy for airway protection.  Skilled Therapeutic Interventions: Treatment session focused on addressing speech-language and dysphagia goals during a self-feeding task. SLP facilitated session with wait time, choice of two, and mod assist verbal and visual cues to verbally express basic wants and needs throughout session.  Patient is verbally expressing wants, needs with phrase level verbal expression; however, he continues to exhibit decreased speech intelligibility resulting from diminished respiratory support and requires cues to repeat. SLP also facilitated session during a self-feeding task in a distracting environment with min assist verbal cues to efficiently masticate Dys. 2 textures and initiate timely swallow. Patient displayed no overt s/s of aspiration while consuming Dys. 2 textures and nectar-thick liquids via cup.  Wife present and was able to return demonstration of cuing during self-feeding task.  Wife also shared with SLP that patient will be discharging to [facility] Monday.       FIM:  Comprehension Comprehension Mode: Auditory Comprehension: 4-Understands basic 75 - 89% of the time/requires cueing 10 -  24% of the time Expression Expression Mode: Verbal Expression: 3-Expresses basic 50 - 74% of the time/requires cueing 25 - 50% of the time. Needs to repeat parts of sentences. Social Interaction Social Interaction: 4-Interacts appropriately 75 - 89% of the time - Needs redirection for appropriate language or to initiate interaction. Problem Solving Problem Solving: 3-Solves basic 50 - 74% of the time/requires cueing 25 - 49% of the time Memory Memory: 3-Recognizes or recalls 50 - 74% of the time/requires cueing 25 - 49% of the time FIM - Eating Eating Activity: 5: Needs verbal cues/supervision  Pain Pain Assessment Pain Assessment: No/denies pain Pain Score: 0-No pain  Therapy/Group: Individual Therapy   Speech Language Pathology Discharge Summary  Patient Details  Name: Collin Diaz MRN: [id number] Date of Birth: Jun 16, 1955  Today's Date: 02/09/2012  Patient has met 7 of 7 long term goals.  Patient to discharge at overall Mod level.  Reasons goals not met: n/a   Clinical Impression/Discharge Summary: Patient met 7 out of 7 long term goals during CIR stay.  He progressed from a Dys.1 pudding-thick diet to a Dys.2 nectar thick diet with overall supervision level verbal cues to utilize safe swallow compensatory strategies.  Patient also made functional gains in ability to sustain attention to task which impacted his ability to solve basic problems, as well as comprehension and expression.  He is discharging at an overall mod assist level and as a result would continue to benefit from skilled SLP services at the next level of care to maximize functional independence and reduce burden of care prior to discharge home with wife.      Care Partner:  Caregiver Able to Provide Assistance: No  Type of Caregiver Assistance: Physical;Cognitive  Recommendation:  24  hour supervision/assistance;Skilled Nursing facility  Rationale for SLP Follow Up: Maximize functional  communication;Maximize cognitive function and independence;Maximize swallowing safety;Reduce caregiver burden   Equipment: none   Reasons for discharge: Discharged from hospital;Treatment goals met   Patient/Family Agrees with Progress Made and Goals Achieved: Yes   See FIM for current functional status  Charlane Ferretti., CCC-SLP [phone]  [name] 02/09/2012, 12:54 PM

## 2012-02-09 NOTE — Progress Notes (Signed)
I have read and agree with the following treatment session.  Annisten Manchester Hall, PT, DPT 

## 2012-02-10 ENCOUNTER — Inpatient Hospital Stay (HOSPITAL_COMMUNITY): Payer: Medicaid Other | Admitting: Speech Pathology

## 2012-02-10 ENCOUNTER — Inpatient Hospital Stay (HOSPITAL_COMMUNITY): Payer: Medicaid Other | Admitting: Occupational Therapy

## 2012-02-10 NOTE — Progress Notes (Signed)
Occupational Therapy Discharge Summary  Patient Details  Name: Collin Diaz MRN: [id number] Date of Birth: Nov 26, 1955  Today's Date: 02/12/2012  Patient has met 4 of 8 long term goals due to improved balance, improved attention and improved awareness as well as improved participation and initiation with self-care tasks.  Patient to discharge at overall Total Assist level with transfers, LB dressing, and toileting; however is mod assist with grooming, bathing, and UB dressing.  Patient's care partner unavailable to provide the necessary physical and cognitive assistance at discharge.  Patient is discharging to SNF for further skilled therapy until he is at a level where it is safe for his wife to provide assistance.  Reasons goals not met: Pt continues to require total assist with slide board or squat pivot transfers secondary to dense hemiplegia and decreased trunk control.  Requires +2 for safety with transfers and in standing for toileting hygiene or LB dressing (pt unable to perform adequate lateral leans due to decreased trunk control to allow for proper hygiene).  Recommendation:  Patient will benefit from ongoing skilled OT services in skilled nursing facility setting to continue to advance functional skills in the area of BADL and Reduce care partner burden.  Equipment: No equipment provided  Reasons for discharge: discharge from hospital  Patient/family agrees with progress made and goals achieved: Yes  OT Discharge Vital Signs Therapy Vitals Temp: 98.3 F (36.8 C) Temp src: Oral Pulse Rate: 73  Resp: 18  BP: 124/85 mmHg Patient Position, if appropriate: Lying Oxygen Therapy SpO2: 94 % O2 Device: None (Room air) Pain   Pt reports occasional pain in Lt shoulder with positioning greater than 90 degrees shoulder flexion or abduction greater than 45 degrees. ADL ADL Grooming: Minimal assistance Where Assessed-Grooming: Sitting at sink;Wheelchair Upper Body Bathing:  Minimal assistance;Minimal cueing Where Assessed-Upper Body Bathing: Edge of bed Lower Body Bathing: Moderate assistance;Minimal cueing Where Assessed-Lower Body Bathing: Edge of bed Upper Body Dressing: Moderate assistance Where Assessed-Upper Body Dressing: Edge of bed Lower Body Dressing: Dependent Where Assessed-Lower Body Dressing: Edge of bed Toileting: Dependent Where Assessed-Toileting: Bedside Commode Toilet Transfer: Dependent Toilet Transfer Method: Charity fundraiser: Extra wide drop arm bedside commode ADL Comments: Pt has demonstrated increased initiation, sequencing, attention to task, and weight shifting to engage in aspects of self-care tasks, continues to require max-total +2 assist with all mobility.  Vision/Perception  Vision - History Baseline Vision: Wears glasses all the time Patient Visual Report: No change from baseline Vision - Assessment Eye Alignment: Within Functional Limits Additional Comments: Pt able to track stimulus in all quadrants  Cognition Overall Cognitive Status: Impaired Arousal/Alertness: Lethargic Orientation Level: Oriented to person;Oriented to place Attention: Selective Sensation Sensation Light Touch: Appears Intact Proprioception: Appears Intact Coordination Gross Motor Movements are Fluid and Coordinated: No Fine Motor Movements are Fluid and Coordinated: No Coordination and Movement Description: Pt with tone develping in left arm, continues with no functional movement. Extremity/Trunk Assessment RUE Assessment RUE Assessment: Within Functional Limits LUE Assessment LUE Assessment: Exceptions to [facility] LUE Strength LUE Overall Strength Comments: Currently Brunnstrum stage I in the left arm and hand. PROM WFLS for all joints. Slight increased flexor tone note in fingers and pectoral   See FIM for current functional status  Leonette Monarch 02/12/2012, 7:55 AM

## 2012-02-10 NOTE — Progress Notes (Signed)
Patient ID: Collin Diaz, male   DOB: 07-Jul-1955, 56 y.o.   MRN: [id number] HPI: Collin Diaz is a 56 y.o. 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 Has history of poor controlled BP and had not had meds filled recently. Admitted 12/30/2011 with weakness, slurring of speech with coughing/chocking and blood pressure 254/143. Patient placed on Cardene drip. Cranial CT and imaging revealed right thalamus and basal ganglia hemorrhage with intraventricular hemorrhage and cytotoxic cerebral edema with right to left midline shift. Evaluated by neurology services with tight blood pressure control with SBP<150. Started on 3% saline drip with goal of 155-160 Na to decrease cerebral edema. Noted dysphagia currently on a dysphagia 1 diet   Subjective/Complaints: Condom cath place Wife notes pt had blood clot in nose Severe dysarthria Review of Systems  Unable to perform ROS: medical condition  Constitutional: Positive for malaise/fatigue.  Neurological: Positive for focal weakness.     Objective: Vital Signs: Blood pressure 122/70, pulse 89, temperature 98.2 F (36.8 C), temperature source Oral, resp. rate 18, height 5' 8.9" (1.75 m), weight 96.5 kg (212 lb 11.9 oz), SpO2 96.00%. No results found. Results for orders placed during the hospital encounter of 01/03/12 (from the past 72 hour(s))  BASIC METABOLIC PANEL     Status: Abnormal   Collection Time   02/09/12 12:00 PM      Component Value Range Comment   Sodium 136  135 - 145 mEq/L    Potassium 4.2  3.5 - 5.1 mEq/L    Chloride 99  96 - 112 mEq/L    CO2 25  19 - 32 mEq/L    Glucose, Bld 107 (*) 70 - 99 mg/dL    BUN 33 (*) 6 - 23 mg/dL    Creatinine, Ser 4.09 (*) 0.50 - 1.35 mg/dL    Calcium 81.1  8.4 - 10.5 mg/dL    GFR calc non Af Amer 38 (*) >90 mL/min    GFR calc Af Amer 44 (*) >90 mL/min      HEENT:  normal Cardio: RRR Resp: CTA B/L GI: BS positive, abdomen tender near tube, site appears clean with appropriate drainage Extremity:  No Edema Skin:   Intact Neuro: Flat, Cranial Nerve II-XII normal, Abnormal Sensory reduced lt touch on L,now able to identify which finger was touched Abnormal Motor o/5 on L side, sensation trace on left also. Abnormal FMC Tone  Hypotonia, Dysarthric and Aphasic Musc/Skel:  Swelling Left hand, mild pain with L shoulder ROM  Assessment/Plan: 1. Functional deficits secondary to R thalamic hemorrhage medically stable for transfer to SNF today       FIM: FIM - Bathing Bathing Steps Patient Completed: Chest;Left Arm;Abdomen;Front perineal area;Right upper leg;Left upper leg;Right lower leg (including foot) Bathing: 3: Mod-Patient completes 5-7 76f 10 parts or 50-74%  FIM - Upper Body Dressing/Undressing Upper body dressing/undressing steps patient completed: Thread/unthread left sleeve of pullover shirt/dress;Put head through opening of pull over shirt/dress;Thread/unthread right sleeve of pullover shirt/dresss Upper body dressing/undressing: 3: Mod-Patient completed 50-74% of tasks FIM - Lower Body Dressing/Undressing Lower body dressing/undressing steps patient completed: Pull pants up/down Lower body dressing/undressing: 1: Two helpers  FIM - Toileting Toileting: 1: Two helpers  FIM - Diplomatic Services operational officer Devices: Psychiatrist Transfers: 1-Two helpers  FIM - Banker Devices: HOB elevated;Arm rests;Bed rails Bed/Chair Transfer: 1: Two helpers  FIM - Locomotion:  Wheelchair Distance: 80 Locomotion: Wheelchair: 2: Travels 50 - 149 ft with moderate assistance (Pt: 50 - 74%) FIM - Locomotion: Ambulation Locomotion: Ambulation Assistive Devices: Parallel bars Ambulation/Gait Assistance: 1: +2 Total assist Locomotion: Ambulation: 0: Activity did not  occur  Comprehension Comprehension Mode: Auditory Comprehension: 4-Understands basic 75 - 89% of the time/requires cueing 10 - 24% of the time  Expression Expression Mode: Verbal Expression: 2-Expresses basic 25 - 49% of the time/requires cueing 50 - 75% of the time. Uses single words/gestures.  Social Interaction Social Interaction: 2-Interacts appropriately 25 - 49% of time - Needs frequent redirection.  Problem Solving Problem Solving: 3-Solves basic 50 - 74% of the time/requires cueing 25 - 49% of the time  Memory Memory: 3-Recognizes or recalls 50 - 74% of the time/requires cueing 25 - 49% of the time   Medical Problem List and Plan:  1. DVT Prophylaxis/Anticoagulation: Pharmaceutical: Lovenox  2. Pain Management: tylenol, can use ultram for pain , use Kpad for back pain 3. Mood:  low dose celexa, abilify already initiated,Apparently he had been using this as an outpt, mood/affect also flat due to effects of CVA 4. Neuropsych: This patient is not capable of making decisions on his/her own behalf 5.Reduced intiation  Methylphenidate-will d/c and observe , no apparent change  Appetite improved.  6. Chronic renal failure now at baseline with po, G tube fluids       7.  HTN: uncontrolled increased cardura 10/23 pm. 8. Dysphagia: D2, nectar thick liquids -poor intake due to cognition and dysphagia- should improve with CVA recovery over the next 1-2 months  -G tube placed 10/11, -TF's bolus at hs and after meals if <50% meal consumed  -tylenol schedule prior to therapies with prn ultram for pain. K pad prn for back pain. .  -pain improving. 9.  Post stroke seizure d/o keppra 10. Acute on chronic renal insufficiency:  BMET at baseline LOS (Days) 38 A FACE TO FACE EVALUATION WAS PERFORMED  Collin Diaz E 02/10/2012, 9:31 AM

## 2012-02-10 NOTE — Progress Notes (Signed)
Occupational Therapy Session Note  Patient Details  Name: Collin Diaz MRN: [id number] Date of Birth: [birth year]  Today's Date: 02/10/2012 Time: 1430-1500 Time Calculation (min): 30 min  Short Term Goals: Week 5:  OT Short Term Goal 1 (Week 5): Pt will complete dynamic sitting balance for 5 mins while washing UB seated EOB. OT Short Term Goal 2 (Week 5): Pt will demonstrate initiation with slide board transfer with ability to reach towards w/c handle and assist with sliding with <25% cues. OT Short Term Goal 3 (Week 5): Pt will complete bed mobility with max assist +1 for positioning and hygiene of peri area. OT Short Term Goal 4 (Week 5): Pt will perfrom UB dressing in supportive sitting with min facilitation   Skilled Therapeutic Interventions/Progress Updates:    Pt seen for 1:1 OT with focus on bed mobility and slide board transfer.  Pt in bed upon arrival and reports ready to get out of bed. Pt with improved initiation with rolling in bed and removing sheets prior to rolling.  Pt initiated slide board placement and transfer with positioning Rt hand on slide board.  Pt's wife stepped in to assist with slide board transfer, she required cues for hand placement to truly assist with weight shifting and mobility.  Therapy Documentation Precautions:  Precautions Precautions: Fall Precaution Comments: L hemiplegia, PEG Tube: maintain HOB at 30 degrees Restrictions Weight Bearing Restrictions: No General:   Vital Signs: Therapy Vitals Temp: 98 F (36.7 C) Temp src: Oral Pulse Rate: 86  Resp: 18  BP: 134/86 mmHg Patient Position, if appropriate: Lying Oxygen Therapy SpO2: 96 % O2 Device: None (Room air) Pain: Pain Assessment Pain Assessment: No/denies pain Pain Score: 0-No pain  See FIM for current functional status  Therapy/Group: Individual Therapy  Leonette Monarch 02/10/2012, 3:59 PM

## 2012-02-10 NOTE — Plan of Care (Signed)
Problem: RH BOWEL ELIMINATION Goal: RH STG MANAGE BOWEL WITH ASSISTANCE STG Manage Bowel with max assist  Outcome: Progressing LBM 10/23 per report

## 2012-02-11 ENCOUNTER — Inpatient Hospital Stay (HOSPITAL_COMMUNITY): Payer: Medicaid Other | Admitting: Physical Therapy

## 2012-02-11 MED ORDER — METHYLPREDNISOLONE 4 MG PO KIT
4.0000 mg | PACK | Freq: Three times a day (TID) | ORAL | Status: DC
  Administered 2012-02-12: 4 mg via ORAL

## 2012-02-11 MED ORDER — METHYLPREDNISOLONE 4 MG PO KIT
8.0000 mg | PACK | Freq: Every evening | ORAL | Status: AC
  Administered 2012-02-11: 8 mg via ORAL

## 2012-02-11 MED ORDER — METHYLPREDNISOLONE 4 MG PO KIT
4.0000 mg | PACK | ORAL | Status: AC
  Administered 2012-02-11: 4 mg via ORAL

## 2012-02-11 MED ORDER — METHYLPREDNISOLONE 4 MG PO KIT: 4.0000 mg | PACK | Freq: Four times a day (QID) | ORAL | Status: DC

## 2012-02-11 MED ORDER — METHYLPREDNISOLONE 4 MG PO KIT: 8.0000 mg | PACK | Freq: Every evening | ORAL | Status: DC

## 2012-02-11 MED ORDER — METHYLPREDNISOLONE 4 MG PO KIT
8.0000 mg | PACK | Freq: Every morning | ORAL | Status: AC
  Administered 2012-02-11: 8 mg via ORAL
  Filled 2012-02-11: qty 21

## 2012-02-11 NOTE — Progress Notes (Signed)
Physical Therapy Session Note  Patient Details  Name: Collin Diaz MRN: [id number] Date of Birth: 05-28-1955  Today's Date: 02/11/2012 Time: 1308-6578 Time Calculation (min): 26 min  Short Term Goals: Week 4:  PT Short Term Goal 1 (Week 4): = LTG  Skilled Therapeutic Interventions/Progress Updates:   Assisted nursing with getting pt off BSC,  Sit to stand with total @ +2, pt = 40%.  Used bar for pt to pull up to stand with total @+2, pt= 30-40%, then standing to reach overhead to facilitate upright posture and trunk extension.  Transfer w/c to bed squat pivot with total @+2, pt equal 30%, repositioning in bed to scoot to [facility] with total @+2. Pt performed w/c mobilty x 20' with max verbal cues, pt with ineffective pushing using UE and LE on right to effectively move the w/c.   Therapy Documentation Precautions:  Precautions Precautions: Fall Precaution Comments: L hemiplegia, PEG Tube: maintain HOB at 30 degrees Restrictions Weight Bearing Restrictions: No Pain: Pain Assessment Pain Score: 0-No pain See FIM for current functional status  Therapy/Group: Individual Therapy  Georges Mouse 02/11/2012, 3:33 PM

## 2012-02-11 NOTE — Progress Notes (Signed)
discharge summary# [phone]

## 2012-02-11 NOTE — Progress Notes (Signed)
Patient ID: Collin Diaz, male   DOB: Sep 11, 1955, 56 y.o.   MRN: [id number] Patient ID: Collin Diaz, male   DOB: Mar 15, 1956, 56 y.o.   MRN: [id number] HPI: Collin Diaz is a 56 y.o. 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 Has history of poor controlled BP and had not had meds filled recently. Admitted 12/30/2011 with weakness, slurring of speech with coughing/chocking and blood pressure 254/143. Patient placed on Cardene drip. Cranial CT and imaging revealed right thalamus and basal ganglia hemorrhage with intraventricular hemorrhage and cytotoxic cerebral edema with right to left midline shift. Evaluated by neurology services with tight blood pressure control with SBP<150. Started on 3% saline drip with goal of 155-160 Na to decrease cerebral edema. Noted dysphagia currently on a dysphagia 1 diet   Subjective/Complaints: Condom cath place Wife notes pt had blood clot in nose Severe dysarthria Review of Systems  Unable to perform ROS: medical condition  Constitutional: Positive for malaise/fatigue.  Neurological: Positive for focal weakness.     Objective: Vital Signs: Blood pressure 122/70, pulse 89, temperature 98.2 F (36.8 C), temperature source Oral, resp. rate 18, height 5' 8.9" (1.75 m), weight 96.5 kg (212 lb 11.9 oz), SpO2 96.00%. No results found. Results for orders placed during the hospital encounter of 01/03/12 (from the past 72 hour(s))  BASIC METABOLIC PANEL     Status: Abnormal   Collection Time   02/09/12 12:00 PM      Component Value Range Comment   Sodium 136  135 - 145 mEq/L    Potassium 4.2  3.5 - 5.1 mEq/L    Chloride 99  96 - 112 mEq/L    CO2 25  19 - 32 mEq/L    Glucose, Bld 107 (*) 70 - 99 mg/dL    BUN 33 (*) 6 - 23 mg/dL    Creatinine, Ser 7.82 (*) 0.50 - 1.35 mg/dL    Calcium 95.6  8.4 - 10.5 mg/dL    GFR calc non  Af Amer 38 (*) >90 mL/min    GFR calc Af Amer 44 (*) >90 mL/min      HEENT: normal Cardio: RRR Resp: CTA B/L GI: BS positive, abdomen tender near tube, site appears clean with appropriate drainage Extremity:  No Edema Skin:   Intact Neuro: Flat, Cranial Nerve II-XII normal, Abnormal Sensory reduced lt touch on L,now able to identify which finger was touched Abnormal Motor o/5 on L side, sensation trace on left also. Abnormal FMC Tone  Hypotonia, Dysarthric and Aphasic Musc/Skel:  Swelling Left hand, mild pain with L shoulder ROM  Assessment/Plan: 1. Functional deficits secondary to R thalamic hemorrhage medically stable for transfer to SNF in am        Medical Problem List and Plan:  1. DVT Prophylaxis/Anticoagulation: Pharmaceutical: Lovenox  2. Pain Management: tylenol, can use ultram for pain , use Kpad for back pain, RSD LUE medrol dosepack 3. Mood:  low dose celexa, abilify already initiated,Apparently he had been using this as an outpt, mood/affect also flat due to effects of CVA 4. Neuropsych: This patient is not capable of making decisions on his/her own behalf 5.Reduced intiation  Methylphenidate-will d/c and observe , no apparent change  Appetite improved.  6. Chronic renal failure now at baseline with po, G tube fluids       7.  HTN: uncontrolled increased cardura 10/23 pm. 8. Dysphagia: D2, nectar thick  liquids -poor intake due to cognition and dysphagia- should improve with CVA recovery over the next 1-2 months  -G tube placed 10/11, -TF's bolus at hs and after meals if <50% meal consumed  -tylenol schedule prior to therapies with prn ultram for pain. K pad prn for back pain. .  -pain improving. 9.  Post stroke seizure d/o keppra 10. Acute on chronic renal insufficiency:  BMET at baseline LOS (Days) 38 A FACE TO FACE EVALUATION WAS PERFORMED  Shaliyah Taite E 02/10/2012, 9:31 AM

## 2012-02-12 MED ORDER — INFLUENZA VIRUS VACC SPLIT PF IM SUSP
0.5000 mL | Freq: Once | INTRAMUSCULAR | Status: AC
  Administered 2012-02-12: 0.5 mL via INTRAMUSCULAR
  Filled 2012-02-12: qty 0.5

## 2012-02-12 NOTE — Progress Notes (Signed)
Patient ID: Collin Diaz, male   DOB: 19-Dec-1955, 56 y.o.   MRN: [id number]  Collin Diaz is a 56 y.o. 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 Has history of poor controlled BP and had not had meds filled recently. Admitted 12/30/2011 with weakness, slurring of speech with coughing/chocking and blood pressure 254/143. Patient placed on Cardene drip. Cranial CT and imaging revealed right thalamus and basal ganglia hemorrhage with intraventricular hemorrhage and cytotoxic cerebral edema with right to left midline shift. Evaluated by neurology services with tight blood pressure control with SBP<150. Started on 3% saline drip with goal of 155-160 Na to decrease cerebral edema. Noted dysphagia currently on a dysphagia 1 diet   Subjective/Complaints: Condom cath place No nose bleeds Severe dysarthria Still taking about 50% of caloric needs and fluid needs po and supplemented with TF Review of Systems  Unable to perform ROS: medical condition  Constitutional: Positive for malaise/fatigue.  Neurological: Positive for focal weakness.     Objective: Vital Signs: Blood pressure 122/70, pulse 89, temperature 98.2 F (36.8 C), temperature source Oral, resp. rate 18, height 5' 8.9" (1.75 m), weight 96.5 kg (212 lb 11.9 oz), SpO2 96.00%. No results found. Results for orders placed during the hospital encounter of 01/03/12 (from the past 72 hour(s))  BASIC METABOLIC PANEL     Status: Abnormal   Collection Time   02/09/12 12:00 PM      Component Value Range Comment   Sodium 136  135 - 145 mEq/L    Potassium 4.2  3.5 - 5.1 mEq/L    Chloride 99  96 - 112 mEq/L    CO2 25  19 - 32 mEq/L    Glucose, Bld 107 (*) 70 - 99 mg/dL    BUN 33 (*) 6 - 23 mg/dL    Creatinine, Ser 7.82 (*) 0.50 - 1.35 mg/dL    Calcium 95.6  8.4 - 10.5 mg/dL    GFR calc non Af Amer 38 (*) >90  mL/min    GFR calc Af Amer 44 (*) >90 mL/min      HEENT: normal Cardio: RRR Resp: CTA B/L GI: BS positive, abdomen tender near tube, site appears clean with appropriate drainage Extremity:  No Edema Skin:   Intact Neuro: Flat, Cranial Nerve II-XII normal, Abnormal Sensory reduced lt touch on L,now able to identify which finger was touched Abnormal Motor o/5 on L side, sensation trace on left also. Abnormal FMC Tone  Hypotonia, Dysarthric and Aphasic Musc/Skel:  Swelling Left hand, mild pain with L shoulder ROM  Assessment/Plan: 1. Functional deficits secondary to R thalamic hemorrhage medically stable for transfer to SNF today       Medical Problem List and Plan:  1. DVT Prophylaxis/Anticoagulation: Pharmaceutical: Lovenox  2. Pain Management: tylenol, can use ultram for pain , use Kpad for back pain, RSD LUE medrol dosepack 3. Mood:  low dose celexa, abilify already initiated,Apparently he had been using this as an outpt, mood/affect also flat due to effects of CVA 4. Neuropsych: This patient is not capable of making decisions on his/her own behalf 5.Reduced intiation  Related to CVA overall improving slowly.  6. Chronic renal failure now at baseline with po, G tube fluids       7.  HTN: uncontrolled increased cardura 10/23 pm. 8. Dysphagia: D2, nectar thick liquids -poor intake due to cognition and dysphagia- should improve with CVA  recovery over the next 1-2 months  -G tube placed 10/11, -TF's bolus at hs and after meals if <50% meal consumed  -tylenol schedule prior to therapies with prn ultram for pain. K pad prn for back pain. .  -pain improving. 9.  Post stroke seizure d/o keppra 10. Acute on chronic renal insufficiency:  BMET at baseline LOS (Days) 38 A FACE TO FACE EVALUATION WAS PERFORMED  Kimala Horne E 02/10/2012, 9:31 AM

## 2012-02-12 NOTE — Discharge Summary (Signed)
Collin Diaz, Collin Diaz NO.:  [id number]  MEDICAL RECORD NO.:  [id number]  LOCATION:  4032                         FACILITY:  [facility]  PHYSICIAN:  Collin Diaz, M.D.DATE OF BIRTH:  1955/08/22  DATE OF ADMISSION:  01/03/2012 DATE OF DISCHARGE:                              DISCHARGE SUMMARY  DATE OF DISCHARGE: February 12, 2012  DISCHARGE DIAGNOSES: 1. Left middle cerebral artery ischemic stroke. 2. New onset seizure. 3. Acute on chronic renal insufficiency, improved. 4. History of depression. 5. RSD left hand  HISTORY OF PRESENT ILLNESS:  [name] is a 56 year old right- handed African American male with history of hypertension, sleep apnea, ischemic left MCA stroke in July 2012 with right-sided weakness.  He has history of poorly controlled blood pressure and has not had meds filled recently.  He was admitted on December 30, 2011 with weakness, slurred speech with choking and coughing and elevated blood pressure of 254/143. The patient was placed on Cardene drip and CT head done showed right thalamic and basal ganglia hemorrhage with intraventricular hemorrhage and cytotoxic cerebral edema with right-to-left midline shift.  He was evaluated by Neurology who recommended strict blood pressure control. He was started on a saline drip with goal of sodium at 155-160 to decrease cerebral edema.  He was placed on D1 diet, pudding liquids due to dysphagia. The patient continued to have dense left hemiparesis, severe dysarthria, delayed processing as well as impairment of auditory Comprehension, exacerbated by lethargy.  He is noted to be dehydrated and was started on gentle hydration.  Therapies are ongoing and rehab was recommended by therapy team.  PAST MEDICAL HISTORY:  Hypertension, prior stroke in July 2012 and depression.  FUNCTIONAL HISTORY:  The patient was independent with ADLs and mobility since last stroke.  FUNCTIONAL STATUS:  The patient  is total assist 30%, +2 total assist 30% for bed mobility, +2 total assist 20% for sit to stand transfers.  He required max assist for bathing tasks, mod assist for upper body dressing.  He was able to maintain sitting balance with min assist for 10 seconds with max verbal cues.  He was noted to have decrease in short- term memory with decrease in recall of new information.  RECENT LABS:  Check of CBC from October 10 revealed hemoglobin 12.2, hematocrit 35.3, white count 5.7, platelets 176.  Recent check of lytes of October 25th revealed sodium 136, potassium 4.2, chloride 99, CO2 of 25, BUN 33, creatinine 1.92, glucose 107.7  HOSPITAL COURSE:  The patient was admitted to rehab on January 03, 2012 inpatient therapies to consist of PT, OT, and speech therapy at least 3 hours 5 days a week.  Past admission, physiatrist, rehab RN, and therapy team have worked together to provide customized collaborative interdisciplinary care.  The patient's blood pressures were monitored on b.i.d. basis.  Rehab RN has worked with the patient on bowel and bladder program as well as monitoring of p.o. intake.  The patient was noted to have extremely poor p.o. intake in part due to poor initiation.  He  developed acute on chronic renal insufficiency requiring IV fluids for hydration.  On September 23, the patient was noted to  have a seizure activity with episode of unresponsiveness.  Followup CT of head was done showing stable size of right thalamic basal ganglia bleed with slight evolutionary changes of blood products, decreasing intraventricular blood.  The patient was started on Keppra for seizure prophylaxis.  He was started on Ritalin to help with initiation but as this was Ineffective therefore it was discontinued.    Interventional Radiology was contacted for placement of gastrostomy  Tube for nutritional support due to poor po intake.  This was performed on January 26, 2012 by Dr. Maryclare Bean.   Currently, PEG tube site is clean, dry, and intact.  The patient is tolerating bolus tube feeds without difficulty.  If he eats less than 50%, bolus tube feeds of 1 can t.i.d. is given after meals.  He also gets 1 can scheduled at bedtime. The patient's blood pressures were initially noted to be labile.  BP meds have been titrated for blood pressure control.  In the last 24 hours, blood pressures are showing improvement ranging from 120s-130s systolics, 70s to 80s diastolic. P.o. intake continues to be variable from 25% to 70%.  The patient was noted to have problems with urinary retention and had extreme discomfort and hematuria with in- and out-caths. Therefore Foley was replaced.  UA was checked on September 28 and was negative for nitrites, negative for leukocyte, had 3-6 rbcs and 0-6 wbcs.   Patient has had improvement in renal status with improvement in hydration. He did develop left wrist and hand pain with decreased in ROM due to RSD and was started on medrol dose pack on 10/27. He is able to tolerate ROM today.  During the patient's stay in rehab, weekly team conferences were held to monitor the patient's progress, set goals, as well as discuss barriers to discharge.  The OT has been working with the patient on ADL tasks.  The patient is showing improvement in initiation in rolling in bed.  He is showing increase attention to detail.  He continued to have fluctuation in sitting balance depending on demand presented and distraction.  He requires +2 assist to complete lower body dressing, +2 assist for sliding board transfer to the right from bed to wheelchair.  He requires min cues to complete grooming tasks.   Speech therapy has been working with the patient on diet tolerance. He was advanced from D1 pudding to D2 diet with Nectar liquids.He requires  supervision with verbal cues to utilize safe swallow strategies.  He is currently able to verbally express wants and needs with a phrase  level the verbal expression, but continues to exhibit decreased speech intelligibility.   PT has been working with the patient on strengthening, and mobility.  The patient is currently able to perform sit-to-stand for Hi-Lo mat with total assist, +2.  He is able to maintain standing with max assist of 2 for 5 minutes x2 rest with improvement in postural control as well as lower extremity weightbearing with post cues of left lower extremity.  He continues to have difficulty in standing with erect posture due to anterior trunk lean in standing.  He is able to propel wheelchair for 50 feet using right hemi-technique and mod assist with right lower extremity. Currently, the patient is requiring extensive amount of assistance. Wife has elected on further therapies at SNF.  Bed is available at [facility] care.  The patient is to be discharged to this facility on February 03, 2017 in improved condition.  DISCHARGE MEDICATIONS: 1. Prinivil 2.5  mg p.o. b.i.d. 2. Keppra 500 mg p.o. b.i.d. 3. H2O flushes 200 mL q.i.d. 4. Osmolite 237 mL t.i.d. past meals if the patient     eats less than 50% as well as schedule can at bedtime.  5. Cardura 8 mg p.o. at bedtime. 6. Celexa 10 mg p.o. per day. 7. Abilify 1 mg p.o. at bedtime. 8. Tylenol 650 mg p.o. B.i.d scheduled 9. Tylenol 650 mg p.o. b.i.d. additionally p.r.n. pain. 10.Trazodone 25-50 mg p.o. at bedtime p.r.n. insomnia. 11.Ultram 50 mg p.o. q.i.d. p.r.n. moderate-to-severe pain. 12. Medrol dose pack-day 2  DIET:  D2 diet, Nectar liquids with full supervision at meals.   SPECIAL INSTRUCTIONS:  24 hours assistance.  Strict intake and output. Routine Foley care.  Progressive PT, OT, speech therapy to continue past discharge.  Routine pressure relief measures.  FOLLOWUP:  The patient to follow up with Dr. Pearlean Brownie in 4 weeks.  Follow up with Dr. Wynn Banker on November 22nd at 10:15 for 10:45 appointment.     Delle Reining,  P.A.   ______________________________ Collin Diaz, M.D.    PL/MEDQ  D:  02/11/2012  T:  02/12/2012  Job:  [id number]  cc:   Pramod P. Pearlean Brownie, MD

## 2012-02-12 NOTE — Progress Notes (Signed)
Social Work Discharge Note Discharge Note  The overall goal for the admission was met for:   Discharge location: No-[facility]-SNF  Length of Stay: No-40 DAYS  Discharge activity level: No-MOD/MIN LEVEL  Home/community participation: No  Services provided included: MD, RD, PT, OT, SLP, RN, TR, Pharmacy and SW  Financial Services: Other: [employer]PROVED [employer]  Follow-up services arranged: Other: NHP  Comments (or additional information):SLOW TO PROGRESS AND TOO MUCH CARE FOR WIFE AT THIS TIME SHE HAS BEEN HERE AND PARTICIPATED IN HIS THERAPIES.  RECENTLY APPROVED SSD AND MA.  Patient/Family verbalized understanding of follow-up arrangements: Yes  Individual responsible for coordination of the follow-up plan: WILMA-WIFE  Confirmed correct DME delivered: Lucy Chris 02/12/2012    Lucy Chris

## 2012-02-12 NOTE — Progress Notes (Signed)
Social Work Patient ID: Collin Diaz, male   DOB: May 05, 1955, 56 y.o.   MRN: [id number] Met with pt and wife to confirm transfer today.  She plans to ride with him and okayed by EMS, after phone call. Awaiting [facility] to get back with this worker regarding time to send.  Work on transport today.

## 2012-02-26 ENCOUNTER — Inpatient Hospital Stay: Payer: Self-pay | Admitting: Physical Medicine & Rehabilitation

## 2012-03-08 ENCOUNTER — Ambulatory Visit (HOSPITAL_BASED_OUTPATIENT_CLINIC_OR_DEPARTMENT_OTHER): Payer: Medicaid Other | Admitting: Physical Medicine & Rehabilitation

## 2012-03-08 ENCOUNTER — Encounter: Payer: Self-pay | Admitting: Physical Medicine & Rehabilitation

## 2012-03-08 ENCOUNTER — Encounter: Payer: Medicaid Other | Attending: Physical Medicine & Rehabilitation

## 2012-03-08 ENCOUNTER — Telehealth: Payer: Self-pay | Admitting: *Deleted

## 2012-03-08 VITALS — BP 158/101 | HR 73 | Resp 14 | Ht 71.0 in | Wt 217.0 lb

## 2012-03-08 DIAGNOSIS — G8110 Spastic hemiplegia affecting unspecified side: Secondary | ICD-10-CM

## 2012-03-08 DIAGNOSIS — R1310 Dysphagia, unspecified: Secondary | ICD-10-CM | POA: Insufficient documentation

## 2012-03-08 DIAGNOSIS — G8114 Spastic hemiplegia affecting left nondominant side: Secondary | ICD-10-CM

## 2012-03-08 DIAGNOSIS — I69959 Hemiplegia and hemiparesis following unspecified cerebrovascular disease affecting unspecified side: Secondary | ICD-10-CM | POA: Insufficient documentation

## 2012-03-08 DIAGNOSIS — I619 Nontraumatic intracerebral hemorrhage, unspecified: Secondary | ICD-10-CM

## 2012-03-08 DIAGNOSIS — I610 Nontraumatic intracerebral hemorrhage in hemisphere, subcortical: Secondary | ICD-10-CM

## 2012-03-08 NOTE — Patient Instructions (Addendum)
1. 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 Dysphagia with improvement in by mouth intake, will ask skilled nursing facility to hold tube feedsIf by mouth meal intake > than 50%        Return to clinic one month Check weekly weights and send documentation to me for next visit Record daily fluid intake  and send a copy to me for my next visit Continue PT OT and speech therapy

## 2012-03-08 NOTE — Progress Notes (Signed)
Subjective:    Patient ID: Collin Diaz, male    DOB: 1956-03-22, 56 y.o.   MRN: [id number] 56 year old right-  handed African American male with history of hypertension, sleep apnea,  ischemic left MCA stroke in July 2012 with right-sided weakness. He has  history of poorly controlled blood pressure and has not had meds filled  recently. He was admitted on December 30, 2011 with weakness, slurred  speech with choking and coughing and elevated blood pressure of 254/143.  The patient was placed on Cardene drip and CT head done showed right  thalamic and basal ganglia hemorrhage with intraventricular hemorrhage  and cytotoxic cerebral edema with right-to-left midline shift. He was  evaluated by Neurology who recommended strict blood pressure control.  He was started on a saline drip with goal of sodium at 155-160 to  decrease cerebral edema. He was placed on D1 diet, pudding liquids due to dysphagia. The patient continued to have dense left hemiparesis, severe dysarthria, delayed processing as well as impairment of auditory  Comprehension, exacerbated by lethargy. He is noted to be dehydrated  and was started on gentle hydration  HPI Has been at [facility] care since October 28 when he was discharged from [facility] health inpatient rehabilitation. Continues to receive tube feeding. Reports eating 100% of meals. Also reports drinking fluids Continues to receive PT and OT as well as speech therapy Pain Inventory Average Pain 7 Pain Right Now 7 My pain is constant and bee sting  In the last 24 hours, has pain interfered with the following? General activity 8 Relation with others 8 Enjoyment of life 8 What TIME of day is your pain at its worst? night Sleep (in general) Fair  Pain is worse with: some activites Pain improves with: medication Relief from Meds: 7  Mobility use a wheelchair needs help with transfers  Function disabled: date disabled 9/13 Do you have any goals in  this area?  yes  Neuro/Psych weakness tingling trouble walking depression  Prior Studies Any changes since last visit?  no  Physicians involved in your care Stays at [facility]   No family history on file. History   Social History  . Marital Status: Married    Spouse Name: N/A    Number of Children: N/A  . Years of Education: N/A   Social History Main Topics  . Smoking status: Never Smoker   . Smokeless tobacco: None  . Alcohol Use: Yes  . Drug Use: No  . Sexually Active:    Other Topics Concern  . None   Social History Narrative  . None   No past surgical history on file. Past Medical History  Diagnosis Date  . Hypertension   . Stroke    BP 158/101  Pulse 73  Resp 14  Ht 5\' 11"  (1.803 m)  Wt 217 lb (98.431 kg)  BMI 30.27 kg/m2  SpO2 99%     Review of Systems  Musculoskeletal: Positive for gait problem.  Neurological: Positive for weakness and numbness.  Psychiatric/Behavioral: Positive for dysphoric mood.  All other systems reviewed and are negative.       Objective:   Physical Exam  Constitutional: He is oriented to person, place, and time.       obese  HENT:  Head: Normocephalic and atraumatic.  Eyes: EOM are normal. Pupils are equal, round, and reactive to light.  Musculoskeletal:       Left hand: He exhibits decreased range of motion, tenderness and swelling.  decreased sensation noted. Decreased strength noted.       Pain with passive extension of the fingers  Neurological: He is alert and oriented to person, place, and time. A sensory deficit is present. He exhibits abnormal muscle tone. Coordination and gait abnormal.       Difficulty in testing sensation secondary to decreased attention Motor strength is 0/5 in the left deltoid, biceps, triceps, grip 2 minus at the left hip extensor/knee extensor synergy pattern Clonus at left ankle  Psychiatric: He has a normal mood and affect.          Assessment & Plan:    1. 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 Dysphagia with improvement in by mouth intake, will ask skilled nursing facility to hold tube feeds and encourage by mouth feeding for the next one week. Check weight on a weekly basis report change in weight. Return to clinic one month Continue PT OT and speech therapy

## 2012-03-08 NOTE — Telephone Encounter (Signed)
Patients spouse, [name], would like a phone call regarding patient's appointment from this morning.

## 2012-03-08 NOTE — Telephone Encounter (Signed)
Left message returning [facility] call.

## 2012-03-11 NOTE — Telephone Encounter (Signed)
Left another message returning patients wifes call.

## 2012-03-12 NOTE — Telephone Encounter (Signed)
Tried numerous times to contact patient.

## 2012-03-14 ENCOUNTER — Emergency Department (HOSPITAL_COMMUNITY)
Admission: EM | Admit: 2012-03-14 | Discharge: 2012-03-14 | Disposition: A | Discharge status: Home / Self Care | Payer: Medicaid Other | Attending: Emergency Medicine | Admitting: Emergency Medicine

## 2012-03-14 ENCOUNTER — Encounter (HOSPITAL_COMMUNITY): Payer: Self-pay | Admitting: Physical Medicine and Rehabilitation

## 2012-03-14 ENCOUNTER — Emergency Department (HOSPITAL_COMMUNITY): Payer: Medicaid Other

## 2012-03-14 DIAGNOSIS — R51 Headache: Secondary | ICD-10-CM | POA: Insufficient documentation

## 2012-03-14 DIAGNOSIS — Z79899 Other long term (current) drug therapy: Secondary | ICD-10-CM | POA: Insufficient documentation

## 2012-03-14 DIAGNOSIS — I69959 Hemiplegia and hemiparesis following unspecified cerebrovascular disease affecting unspecified side: Secondary | ICD-10-CM | POA: Insufficient documentation

## 2012-03-14 DIAGNOSIS — Z8673 Personal history of transient ischemic attack (TIA), and cerebral infarction without residual deficits: Secondary | ICD-10-CM | POA: Insufficient documentation

## 2012-03-14 DIAGNOSIS — I10 Essential (primary) hypertension: Secondary | ICD-10-CM | POA: Insufficient documentation

## 2012-03-14 MED ORDER — HYDROCHLOROTHIAZIDE 25 MG PO TABS
50.0000 mg | ORAL_TABLET | Freq: Every day | ORAL | Status: DC
  Administered 2012-03-14: 50 mg via ORAL
  Filled 2012-03-14: qty 1

## 2012-03-14 MED ORDER — HYDROCHLOROTHIAZIDE 25 MG PO TABS
25.0000 mg | ORAL_TABLET | Freq: Every day | ORAL | Status: DC
  Filled 2012-03-14: qty 1

## 2012-03-14 MED ORDER — CLONIDINE HCL 0.2 MG PO TABS
0.2000 mg | ORAL_TABLET | Freq: Once | ORAL | Status: AC
  Administered 2012-03-14: 0.2 mg via ORAL
  Filled 2012-03-14: qty 1

## 2012-03-14 MED ORDER — LISINOPRIL 5 MG PO TABS: 5.0000 mg | ORAL_TABLET | Freq: Every day | ORAL | Status: DC

## 2012-03-14 MED ORDER — HYDROCHLOROTHIAZIDE 12.5 MG PO CAPS: 25.0000 mg | ORAL_CAPSULE | Freq: Every day | ORAL | Status: DC

## 2012-03-14 MED ORDER — DOXAZOSIN MESYLATE 8 MG PO TABS
8.0000 mg | ORAL_TABLET | Freq: Every day | ORAL | Status: DC
  Administered 2012-03-14: 8 mg via ORAL
  Filled 2012-03-14: qty 1

## 2012-03-14 NOTE — ED Provider Notes (Signed)
History     CSN: [id number]  Arrival date & time 03/14/12  0846   First MD Initiated Contact with Patient 03/14/12 [phone]     Chief Complaint  Patient presents with  . 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 His wife has been unable to fill his blood pressure medications due to transportation issues. He has taken his Lisinopril since DC from rehab facility but not doxazosin or HCTZ. This morning at 5 am he told his wife he had a HA, she checked his blood pressure, it was reportedly 180 systolic. She rechecked and it was 190 systolic at 0730 so she called EMS due to concern for recurrent CVA. He denies symptoms currently. He states his HA resolved spontaneously at 0730 this AM. He denies HA, blurred vision, new weakness, chest pain, SOB, abdominal pain, nausea or vomiting. His wife states he has been eating well. She will be able to fill his prescriptions today. She feels that she is able to adequately care for him at home. She has a daily aide that comes for assistance.   Past Medical History  Diagnosis Date  . Hypertension   . Stroke     No past surgical history on file.  History reviewed. No pertinent family history.  History  Substance Use Topics  . Smoking status: Never Smoker   . Smokeless tobacco: Not on file  . Alcohol Use: Yes    Review of Systems  Constitutional: Negative for fever, chills and appetite change.  HENT: Negative for neck pain and neck stiffness.   Eyes: Negative for photophobia and visual disturbance.  Respiratory: Negative for cough and shortness of breath.   Cardiovascular: Negative for chest pain and palpitations.  Gastrointestinal: Negative for nausea, vomiting  and abdominal pain.  Genitourinary: Negative for dysuria, frequency and decreased urine volume.  Musculoskeletal: Negative for myalgias, back pain and arthralgias.  Skin: Negative for pallor and rash.  Neurological: Positive for headaches (resolved).  Psychiatric/Behavioral: Negative for confusion and agitation.    Allergies  Nsaids  Home Medications   Current Outpatient Rx  Name  Route  Sig  Dispense  Refill  . ACETAMINOPHEN 325 MG PO TABS   Oral   Take 650 mg by mouth every 6 (six) hours as needed.         Marland Kitchen CITALOPRAM HYDROBROMIDE 10 MG PO TABS   Oral   Take 10 mg by mouth daily.         Marland Kitchen DOXAZOSIN MESYLATE 4 MG PO TABS   Oral   Take 4 mg by mouth at bedtime.         Marland Kitchen LEVETIRACETAM 100 MG/ML PO SOLN   Oral   Take 10 mg/kg by mouth 2 (two) times daily.         Marland Kitchen LISINOPRIL-HYDROCHLOROTHIAZIDE 20-25 MG PO TABS   Oral   Take 1 tablet by mouth daily.         . OSMOLITE 1.5 CAL PO LIQD   Per Tube   Place 1,000 mLs into feeding tube continuous.         . STUDY MEDICATION   Oral   Take 1 tablet by mouth daily. Either Febexustat, Allopurinol, or Placebo         .  TRAMADOL HCL 50 MG PO TABS   Oral   Take 50 mg by mouth 4 (four) times daily.         . TRAZODONE HCL 50 MG PO TABS   Oral   Take 50 mg by mouth at bedtime.           BP 129/99  Pulse 64  Temp 97.6 F (36.4 C) (Oral)  Resp 18  SpO2 97%  Physical Exam  Vitals reviewed. Constitutional: He is oriented to person, place, and time. He is cooperative. No distress.       Chronically ill appearing gentleman  HENT:  Head: Normocephalic and atraumatic.  Mouth/Throat: Mucous membranes are dry.  Eyes: Conjunctivae normal and EOM are normal. Pupils are equal, round, and reactive to light.  Neck: Normal range of motion and full passive range of motion without pain. Neck supple. No JVD present.  Cardiovascular: Normal rate, regular rhythm, S1 normal, S2 normal and normal heart sounds.  Exam  reveals no decreased pulses.   Pulmonary/Chest: Effort normal and breath sounds normal. He has no decreased breath sounds.  Abdominal: Soft. Normal appearance and bowel sounds are normal. There is no tenderness.  Musculoskeletal:       Chronic bilateral weakness, at baseline per his wife Chronic left lower extremity atrophy, at baseline   Neurological: He is alert and oriented to person, place, and time. No cranial nerve deficit. GCS eye subscore is 4. GCS verbal subscore is 5. GCS motor subscore is 6.       Chronic bilateral weakness, no change from baseline. No new cranial nerve deficit.    Skin: Skin is warm and dry.    ED Course  Procedures  Labs Reviewed - No data to display Ct Head Wo Contrast  03/14/2012  *RADIOLOGY REPORT*  Clinical Data: History of thalamic bleeding, new onset headache  CT HEAD WITHOUT CONTRAST  Technique:  Contiguous axial images were obtained from the base of the skull through the vertex without contrast.  Comparison: 01/08/2012; 01/02/2012; 12/31/2011; 12/30/2011  Findings:  There has been interval resolution of hyperattenuating blood at the site of previously noted right thalamic/basal ganglial hemorrhage. No new intraparenchymal or extra-axial hemorrhage.  Redemonstrated extensive periventricular hypodensities compatible with microvascular ischemic disease. Unchanged old/remote lacunar infarct within the left basal ganglia/external capsule.  Given extensive background parenchymal abnormalities, there is no CT evidence of acute large territory infarct.  No intraparenchymal or extra-axial mass.  Unchanged size configuration of the ventricles and basilar cisterns with mild asymmetric dilatation of the left lateral ventricle.  No midline shift.  Paranasal sinuses and mastoid air cells are normal. Regional soft tissues are normal.  No displaced calvarial fracture.  IMPRESSION: 1.  Interval resolution of previously noted right basal ganglial/thalamic hemorrhage without  acute intracranial process. 2.  Advanced microvascular ischemic disease with unchanged remote, remote lacunar infarct within the left basal ganglia/external capsule.   Original Report Authenticated By: Tacey Ruiz, MD    1. Hypertension    MDM  56 yo AAM with complex medical history including poorly controlled HTN, ischemic stroke in 2012 resulting in residual right sided weakness, thalamic hemorrhage in 12/2011 resulting in left sided weakness, dysarthria and dysphagia, who recently returned home 2 days ago presents with elevated blood pressure. Headache had resolved prior to arrival. Currently asymptomatic. No new neurologic deficits. SBP elevated to 200. Doubt hypertensive emergency as he is asymptomatic. ECG without significant change. Lung exam normal, doubt pulmonary edema. Obtained head CT due to previous  headache, which showed improving thalamic infarcts from previous imaging, but no acute changes. Patient blood pressure improved with clonidine and doxazosin. Discussed w/u with the patient and his wife, she feels she is able to take care of him at home. She will be able to get his medications filled tomorrow. Felt he was stable for outpatient management as no significant acute findings, blood pressure controlled, his wife is comfortable taking care of him at home, head CT negative. Return precautions to include recurrent symptoms, new neurologic deficits or other concerning symptoms were given. His wife is in agreement with plan and voiced understanding.   ECG: SR, rate 72, LAD, Q wave in aVR, T wave flattening in leads III, aVF and V1. When compared to ECG from Sep. 2013, only change is flattened T wave in lead V1.   Reviewed imaging and previous medical records, utilized in MDM  Discussed case with Dr. Patria Mane  Clinical Impression 1. Hypertension       Margie Billet, MD 03/14/12 [phone]

## 2012-03-14 NOTE — ED Provider Notes (Signed)
I saw and evaluated the patient, reviewed the resident's note and I agree with the findings and plan.   The patient has been noncompliant with his antihypertensives.  He was given doses in the emergency department.  His blood pressure is improved.  CT head is without evidence of acute pathology.  Patient's been discharged home with prescriptions for his antihypertensives.  Both the patient and his spouse understand the importance of medication compliance and close followup with their doctor.  Lyanne Co, MD 03/14/12 [phone]

## 2012-03-14 NOTE — ED Notes (Addendum)
Pt presents to department for evaluation of hypertension. Wife states he has not been able to take BP medications, unable to obtain them until tomorrow. Denies pain at the time. No new neurological deficits noted. Pt does have PEG tube.

## 2012-03-14 NOTE — ED Notes (Signed)
[facility] called for transport. Waiting on wife to come back to exam room before transport.

## 2012-03-14 NOTE — ED Notes (Signed)
Pt presents to department via [facility] from home. Pt reports hypertension this morning, states he had headache last night. BP 180/120 per EMS. Pt is bedridden and had x2 CVA's in past, paralyzed on L side, but no new neurological deficits. Pt is alert and oriented x4. Denies pain.

## 2012-03-25 ENCOUNTER — Encounter: Payer: Medicaid Other | Attending: Physical Medicine & Rehabilitation

## 2012-03-25 ENCOUNTER — Ambulatory Visit (HOSPITAL_BASED_OUTPATIENT_CLINIC_OR_DEPARTMENT_OTHER): Payer: Medicaid Other | Admitting: Physical Medicine & Rehabilitation

## 2012-03-25 ENCOUNTER — Encounter: Payer: Self-pay | Admitting: Physical Medicine & Rehabilitation

## 2012-03-25 VITALS — BP 114/77 | HR 92 | Resp 14 | Ht 71.0 in | Wt 217.0 lb

## 2012-03-25 DIAGNOSIS — I619 Nontraumatic intracerebral hemorrhage, unspecified: Secondary | ICD-10-CM

## 2012-03-25 DIAGNOSIS — I6930 Unspecified sequelae of cerebral infarction: Secondary | ICD-10-CM

## 2012-03-25 DIAGNOSIS — I69320 Aphasia following cerebral infarction: Secondary | ICD-10-CM

## 2012-03-25 DIAGNOSIS — R1310 Dysphagia, unspecified: Secondary | ICD-10-CM | POA: Insufficient documentation

## 2012-03-25 DIAGNOSIS — I69920 Aphasia following unspecified cerebrovascular disease: Secondary | ICD-10-CM

## 2012-03-25 DIAGNOSIS — I610 Nontraumatic intracerebral hemorrhage in hemisphere, subcortical: Secondary | ICD-10-CM

## 2012-03-25 DIAGNOSIS — I69959 Hemiplegia and hemiparesis following unspecified cerebrovascular disease affecting unspecified side: Secondary | ICD-10-CM | POA: Insufficient documentation

## 2012-03-25 DIAGNOSIS — Z8673 Personal history of transient ischemic attack (TIA), and cerebral infarction without residual deficits: Secondary | ICD-10-CM

## 2012-03-25 NOTE — Progress Notes (Signed)
Subjective:    Patient ID: Collin Diaz, male    DOB: 1956-02-04, 56 y.o.   MRN: [id number]  HPI Patient is now at home. Stays with his wife. She will need to call additional help for transfers. He has been eating very well. His wife has been carefully writing down how much fluids and solids he eats. This appears to be adequate over the last 2 weeks 1 ED visit for elevated blood pressure now improved  Pain Inventory Average Pain 10 Pain Right Now 0 My pain is sharp, tingling and aching  In the last 24 hours, has pain interfered with the following? General activity 5 Relation with others 5 Enjoyment of life 5 What TIME of day is your pain at its worst? morning and night Sleep (in general) Fair  Pain is worse with: some activites Pain improves with: rest and therapy/exercise Relief from Meds: 2  Mobility how many minutes can you walk? none use a wheelchair Do you have any goals in this area?  yes  Function not employed: date last employed  I need assistance with the following:  feeding, dressing, bathing, toileting, meal prep, household duties and shopping Do you have any goals in this area?  yes  Neuro/Psych bowel control problems weakness numbness tingling trouble walking dizziness depression anxiety  Prior Studies Any changes since last visit?  no  Physicians involved in your care Any changes since last visit?  no   History reviewed. No pertinent family history. History   Social History  . Marital Status: Married    Spouse Name: N/A    Number of Children: N/A  . Years of Education: N/A   Social History Main Topics  . Smoking status: Never Smoker   . Smokeless tobacco: None  . Alcohol Use: Yes  . Drug Use: No  . Sexually Active:    Other Topics Concern  . None   Social History Narrative  . None   History reviewed. No pertinent past surgical history. Past Medical History  Diagnosis Date  . Hypertension   . Stroke    BP 114/77  Pulse  92  Resp 14  Ht 5\' 11"  (1.803 m)  Wt 217 lb (98.431 kg)  BMI 30.27 kg/m2  SpO2 97%     Review of Systems  Constitutional: Positive for diaphoresis.  Respiratory: Positive for cough and wheezing.   Musculoskeletal: Positive for gait problem.  Neurological: Positive for dizziness, weakness and numbness.  Psychiatric/Behavioral: Positive for dysphoric mood. The patient is nervous/anxious.   All other systems reviewed and are negative.       Objective:   Physical Exam  Constitutional: He appears well-developed.       Obese  Neurological: He is alert.       1/5 strength in the right deltoid, biceps, triceps, grip 2 minus in the knee and hip extensor synergy trace in the ankle  Could not fully assess sensation due to to communication problems  Psychiatric: His affect is blunt. His speech is delayed. He is slowed and withdrawn. Cognition and memory are impaired.          Assessment & Plan:  1. 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 2. 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 Last meal was at 9 AM will remove G-tube  G-tube removal Patient is not on anticoagulants. Last meal 9 AM there for greater  than 6 hours ago Patient placed in a supine position. His wife is here with [place] and requested the G-tube to be removed  Traction removal type PEG tube removed without difficulty. No bleeding no drainage of stomach contents. 4 x 4 pressure dressing applied with paper tape. Post procedure instructions given. RTC 1 month  Prolonged visit with counseling coordination of care and G-tube removal

## 2012-03-25 NOTE — Patient Instructions (Addendum)
Do not remove the dressing for 24 hours. If there is bleeding that soaked the dressing you'll need to go to the emergency department. Do not eat for 4 hours after the tube is removed. See me in 2 months follow up with primary MD

## 2012-03-27 ENCOUNTER — Telehealth: Payer: Self-pay | Admitting: *Deleted

## 2012-03-27 ENCOUNTER — Ambulatory Visit (HOSPITAL_COMMUNITY): Payer: Medicaid Other

## 2012-03-27 NOTE — Telephone Encounter (Signed)
Dr. Wynn Banker removed patients feeding tube on 12/9. Need instructions on dressing the area

## 2012-03-27 NOTE — Telephone Encounter (Addendum)
Patient's spouse, [name], would like to know if we can refill patients Tramadol Prescription?  Should still have some from 11/22

## 2012-03-27 NOTE — Telephone Encounter (Signed)
Left detailed message advising Mrs Filyaw to use gauze and tape to cover the opening.  If gauze becomes drenched in blood they need to go to the emergency room.

## 2012-03-27 NOTE — Telephone Encounter (Signed)
Mrs Guilbault called again regarding pain medication for patient.  He is hurting in his leg and shoulder.  Please advise.

## 2012-03-28 MED ORDER — GABAPENTIN 100 MG PO CAPS: 100.0000 mg | ORAL_CAPSULE | Freq: Three times a day (TID) | ORAL | Status: DC

## 2012-03-28 NOTE — Telephone Encounter (Signed)
Left message stating gabapentin was sent to pharmacy.

## 2012-03-28 NOTE — Telephone Encounter (Signed)
Gabapentin written

## 2012-04-01 ENCOUNTER — Encounter (HOSPITAL_COMMUNITY): Payer: Self-pay | Admitting: Emergency Medicine

## 2012-04-01 ENCOUNTER — Emergency Department (HOSPITAL_COMMUNITY)
Admission: EM | Admit: 2012-04-01 | Discharge: 2012-04-01 | Disposition: A | Discharge status: Home / Self Care | Payer: Medicaid Other | Attending: Emergency Medicine | Admitting: Emergency Medicine

## 2012-04-01 ENCOUNTER — Emergency Department (HOSPITAL_COMMUNITY): Payer: Medicaid Other

## 2012-04-01 DIAGNOSIS — M25519 Pain in unspecified shoulder: Secondary | ICD-10-CM | POA: Insufficient documentation

## 2012-04-01 DIAGNOSIS — I69921 Dysphasia following unspecified cerebrovascular disease: Secondary | ICD-10-CM | POA: Insufficient documentation

## 2012-04-01 DIAGNOSIS — I69922 Dysarthria following unspecified cerebrovascular disease: Secondary | ICD-10-CM | POA: Insufficient documentation

## 2012-04-01 DIAGNOSIS — Z79899 Other long term (current) drug therapy: Secondary | ICD-10-CM | POA: Insufficient documentation

## 2012-04-01 DIAGNOSIS — M79609 Pain in unspecified limb: Secondary | ICD-10-CM | POA: Insufficient documentation

## 2012-04-01 DIAGNOSIS — I69959 Hemiplegia and hemiparesis following unspecified cerebrovascular disease affecting unspecified side: Secondary | ICD-10-CM | POA: Insufficient documentation

## 2012-04-01 DIAGNOSIS — M25539 Pain in unspecified wrist: Secondary | ICD-10-CM | POA: Insufficient documentation

## 2012-04-01 DIAGNOSIS — I10 Essential (primary) hypertension: Secondary | ICD-10-CM | POA: Insufficient documentation

## 2012-04-01 DIAGNOSIS — M79643 Pain in unspecified hand: Secondary | ICD-10-CM

## 2012-04-01 LAB — CBC WITH DIFFERENTIAL/PLATELET
Basophils Relative: 1 % (ref 0–1)
HCT: 41 % (ref 39.0–52.0)
Hemoglobin: 13.7 g/dL (ref 13.0–17.0)
Lymphs Abs: 1.5 10*3/uL (ref 0.7–4.0)
MCHC: 33.4 g/dL (ref 30.0–36.0)
Monocytes Absolute: 0.3 10*3/uL (ref 0.1–1.0)
Monocytes Relative: 9 % (ref 3–12)
Neutro Abs: 1.7 10*3/uL (ref 1.7–7.7)
RBC: 4.59 MIL/uL (ref 4.22–5.81)

## 2012-04-01 LAB — BASIC METABOLIC PANEL
BUN: 27 mg/dL — ABNORMAL HIGH (ref 6–23)
CO2: 24 mEq/L (ref 19–32)
Chloride: 106 mEq/L (ref 96–112)
Creatinine, Ser: 2.27 mg/dL — ABNORMAL HIGH (ref 0.50–1.35)
GFR calc Af Amer: 36 mL/min — ABNORMAL LOW (ref >90)
Glucose, Bld: 111 mg/dL — ABNORMAL HIGH (ref 70–99)

## 2012-04-01 MED ORDER — TRAMADOL HCL 50 MG PO TABS: 50.0000 mg | ORAL_TABLET | Freq: Four times a day (QID) | ORAL | Status: DC | PRN

## 2012-04-01 MED ORDER — HYDROCODONE-ACETAMINOPHEN 5-325 MG PO TABS: 1.0000 | ORAL_TABLET | Freq: Once | ORAL | Status: DC

## 2012-04-01 MED ORDER — STARCH (THICKENING) PO POWD: ORAL | Status: DC | PRN

## 2012-04-01 MED ORDER — TRAMADOL HCL 50 MG PO TABS
50.0000 mg | ORAL_TABLET | Freq: Once | ORAL | Status: AC
  Administered 2012-04-01: 50 mg via ORAL
  Filled 2012-04-01: qty 1

## 2012-04-01 MED ORDER — STARCH (THICKENING) PO POWD
ORAL | Status: DC | PRN
  Filled 2012-04-01: qty 227

## 2012-04-01 NOTE — ED Provider Notes (Signed)
History     CSN: [id number]  Arrival date & time 04/01/12  0720   First MD Initiated Contact with Patient 04/01/12 [phone]      Chief Complaint  Patient presents with  . Hand Pain    (Consider location/radiation/quality/duration/timing/severity/associated sxs/prior treatment) HPI Collin Diaz is a 56 y.o. male who presents with complaint of pain to the left shoulder and elbow. Pt with hx of two strokes, last one just 2 months ago, which left him with left sided hemoplegya, dysarthria, dysphagia. Pt is bed bound. At baseline unable to move left arm. Pt has nurse and therapy at home, mainly taken care of by his wife, has been home from [facility] for 2 wks now. States yesterday, they were moving him up in bed, and since then, having pain to left shoulder and arm. Pt denies prior pain to the arm. No other injuries. No chest pain, no SOB, no fever. Pt's BP was elevated this morning per wife, she gave him his BP medications. Pt states pain worsened with palpation of left shoulder and elbow.    Past Medical History  Diagnosis Date  . Hypertension   . Stroke     History reviewed. No pertinent past surgical history.  History reviewed. No pertinent family history.  History  Substance Use Topics  . Smoking status: Never Smoker   . Smokeless tobacco: Not on file  . Alcohol Use: Yes      Review of Systems  Constitutional: Negative for fever and chills.  Musculoskeletal: Positive for myalgias and arthralgias.  Skin: Negative for color change.  All other systems reviewed and are negative.    Allergies  Nsaids  Home Medications   Current Outpatient Rx  Name  Route  Sig  Dispense  Refill  . ACETAMINOPHEN 325 MG PO TABS   Oral   Take 650 mg by mouth every 6 (six) hours as needed.         Marland Kitchen CITALOPRAM HYDROBROMIDE 10 MG PO TABS   Oral   Take 10 mg by mouth daily.         Marland Kitchen DOXAZOSIN MESYLATE 4 MG PO TABS   Oral   Take 4 mg by mouth at bedtime.         Marland Kitchen GABAPENTIN  100 MG PO CAPS   Oral   Take 1 capsule (100 mg total) by mouth 3 (three) times daily.   90 capsule   4   . HYDROCHLOROTHIAZIDE 12.5 MG PO CAPS   Oral   Take 2 capsules (25 mg total) by mouth daily.   6 capsule   0   . LEVETIRACETAM 100 MG/ML PO SOLN   Oral   Take 10 mg/kg by mouth 2 (two) times daily.         Marland Kitchen LISINOPRIL 5 MG PO TABS   Oral   Take 2.5 mg by mouth daily.         Marland Kitchen LISINOPRIL 5 MG PO TABS   Oral   Take 1 tablet (5 mg total) by mouth daily.   3 tablet   0   . OSMOLITE 1.5 CAL PO LIQD   Per Tube   Place 1,000 mLs into feeding tube continuous.         . TRAMADOL HCL 50 MG PO TABS   Oral   Take 50 mg by mouth 4 (four) times daily.         . TRAZODONE HCL 50 MG PO TABS   Oral   Take  50 mg by mouth at bedtime.           There were no vitals taken for this visit.  Physical Exam  Nursing note and vitals reviewed. Constitutional: He is oriented to person, place, and time. He appears well-developed and well-nourished. No distress.  HENT:  Head: Normocephalic.  Eyes: Conjunctivae normal are normal.  Neck: Neck supple.  Cardiovascular: Normal rate, regular rhythm and normal heart sounds.   Pulmonary/Chest: Effort normal and breath sounds normal. No respiratory distress. He has no wheezes. He has no rales.  Musculoskeletal:       Left arm in the plastic molded wrist and hand splint. Distal radial pulses intact bilaterally. No swelling, erythema, bruising noted over left arm and hand. Hand and wrist non tender. Tenderness with palpation and passive ROM at left elbow and shoulder joint. Pt unable to move left arm due to a past stroke at baseline  Neurological: He is alert and oriented to person, place, and time.  Skin: Skin is warm and dry.  Psychiatric: He has a normal mood and affect. His behavior is normal.    ED Course  Procedures (including critical care time)  Pt with left shoulder and elbow pain after being pulled up in bed yesterday. No  signs of trauma, no bruising or swelling. Good distal pulses. Will gt x-rays.   Dg Elbow Complete Left  04/01/2012  *RADIOLOGY REPORT*  Clinical Data: Left arm pain.  LEFT ELBOW - COMPLETE 3+ VIEW  Comparison: No priors.  Findings: Four views of the left elbow demonstrate no acute displaced fracture, subluxation, dislocation, joint or soft tissue abnormality.  IMPRESSION: 1.  No acute radiographic abnormality of the left elbow.   Original Report Authenticated By: Trudie Reed, M.D.     Dg Shoulder Left  04/01/2012  *RADIOLOGY REPORT*  Clinical Data: Arm pain.  LEFT SHOULDER - 2+ VIEW  Comparison: No priors.  Findings: Three views of the left shoulder demonstrate no acute displaced fracture, subluxation, dislocation, joint or soft tissue abdomen.  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  IMPRESSION: 1.  No acute radiographic abnormality of the left shoulder.   Original Report Authenticated By: Trudie Reed, M.D.     9:33 AM X-rays negative, however, pt became very somnolent while in ED. He is awaken with painful stimuli but falls right back asleep. Wife admits to giving him neuron tin this morning. States he had no improvement with one, so she gave him another one. He has not taken this medications before. Will monitor to make sure his mental status improves.     Date: 04/01/2012  Rate: 88  Rhythm: normal sinus rhythm  QRS Axis: left  Intervals: normal  ST/T Wave abnormalities: normal  Conduction Disutrbances:left anterior fascicular block  Narrative Interpretation:   Old EKG Reviewed: unchanged  Pt signed out to [facility], to be dispositioned when more alert/awake.  No diagnosis found.    MDM         Lottie Mussel, PA 04/01/12 [phone]

## 2012-04-01 NOTE — ED Provider Notes (Signed)
This is a 56 year old male, who presents emergency Department with hand pain. He was originally seen by PA Kirichenko, supervised by Dr. Ranae Palms in [place]. He has been moved to the CDU for observation. His wife reports that she gave him 3 gabapentin, and he is now feeling very tired. The plan at sign out was to monitor the patient, and make sure that he wakes up appropriately.  Patient denies headache, blurred vision, new hearing loss, sore throat, chest pain, shortness of breath, nausea, vomiting, diarrhea, constipation, dysuria, peripheral edema, back pain, numbness or tingling of the extremities.  PE: Gen: A&O x4, very drowsy, however GCS remains 15. HEENT: PERRL, EOM intact CHEST: RRR, no m/r/g LUNGS: CTAB, no w/r/r ABD: BS x 4, ND/NT EXT: No edema, strong peripheral pulses NEURO: Sensation and strength intact bilaterally  1:33 PM  the patient no longer drowsy. He denies pain at this time. He denies any symptoms at this time. States that he is ready to go home. Wife is agreeable. I recommended primary care followup, to which they agreed. Patient is stable and ready for discharge.   Roxy Horseman, PA-C 04/01/12 1333

## 2012-04-01 NOTE — ED Notes (Signed)
Pt PMH of CVA with left side paralysis. Pt wife at bed side, pt states "it just popped". Family and pt poor historians when trying to obtain hx of current injury.

## 2012-04-01 NOTE — ED Notes (Addendum)
Pt c/o pain in left arm/hand. Pt wife reports pt began c/o pain yesterday after pt heard "a pop" after being moved up in bed by family members.

## 2012-04-01 NOTE — ED Notes (Signed)
Pt being moved to CDU per charge RN. Report given

## 2012-04-01 NOTE — ED Provider Notes (Signed)
Medical screening examination/treatment/procedure(s) were performed by non-physician practitioner and as supervising physician I was immediately available for consultation/collaboration.   Glynn Octave, MD 04/01/12 [phone]

## 2012-04-01 NOTE — ED Notes (Signed)
Pt drowsy. Will respond to verbal stimuli, have to keep stimulating pt to keep him awake. PA-C notified. Wife reports pt was given his Neurontin this AM for pain

## 2012-04-01 NOTE — ED Notes (Signed)
Instructions given to patient and wife

## 2012-04-01 NOTE — ED Notes (Signed)
Wife in room and requesting pain medication for the patient, explained the patient had said had said he didn't want any at that time, she then asked if he does and he stated yes.

## 2012-04-02 ENCOUNTER — Ambulatory Visit (HOSPITAL_COMMUNITY): Admission: RE | Admit: 2012-04-02 | Payer: Medicaid Other | Source: Ambulatory Visit

## 2012-04-02 ENCOUNTER — Other Ambulatory Visit (HOSPITAL_COMMUNITY): Payer: Self-pay

## 2012-04-02 NOTE — ED Provider Notes (Signed)
Medical screening examination/treatment/procedure(s) were performed by non-physician practitioner and as supervising physician I was immediately available for consultation/collaboration.   Graysin Luczynski, MD 04/02/12 0708 

## 2012-04-04 ENCOUNTER — Encounter (HOSPITAL_COMMUNITY): Payer: Self-pay | Admitting: Emergency Medicine

## 2012-04-04 ENCOUNTER — Emergency Department (HOSPITAL_COMMUNITY): Payer: Medicaid Other

## 2012-04-04 ENCOUNTER — Inpatient Hospital Stay (HOSPITAL_COMMUNITY): Payer: Medicaid Other

## 2012-04-04 ENCOUNTER — Inpatient Hospital Stay (HOSPITAL_COMMUNITY)
Admission: EM | Admit: 2012-04-04 | Discharge: 2012-04-09 | DRG: 065 | Disposition: A | Discharge status: Home / Self Care | Payer: Medicaid Other | Attending: Internal Medicine | Admitting: Internal Medicine

## 2012-04-04 DIAGNOSIS — R55 Syncope and collapse: Secondary | ICD-10-CM

## 2012-04-04 DIAGNOSIS — N189 Chronic kidney disease, unspecified: Secondary | ICD-10-CM | POA: Diagnosis present

## 2012-04-04 DIAGNOSIS — D696 Thrombocytopenia, unspecified: Secondary | ICD-10-CM | POA: Diagnosis present

## 2012-04-04 DIAGNOSIS — F32A Depression, unspecified: Secondary | ICD-10-CM | POA: Diagnosis present

## 2012-04-04 DIAGNOSIS — E66811 Obesity, class 1: Secondary | ICD-10-CM | POA: Diagnosis present

## 2012-04-04 DIAGNOSIS — G9059 Complex regional pain syndrome I of other specified site: Secondary | ICD-10-CM | POA: Diagnosis present

## 2012-04-04 DIAGNOSIS — G4733 Obstructive sleep apnea (adult) (pediatric): Secondary | ICD-10-CM | POA: Diagnosis present

## 2012-04-04 DIAGNOSIS — E669 Obesity, unspecified: Secondary | ICD-10-CM | POA: Diagnosis present

## 2012-04-04 DIAGNOSIS — I6340 Cerebral infarction due to embolism of unspecified cerebral artery: Secondary | ICD-10-CM

## 2012-04-04 DIAGNOSIS — I129 Hypertensive chronic kidney disease with stage 1 through stage 4 chronic kidney disease, or unspecified chronic kidney disease: Secondary | ICD-10-CM | POA: Diagnosis present

## 2012-04-04 DIAGNOSIS — R29898 Other symptoms and signs involving the musculoskeletal system: Secondary | ICD-10-CM | POA: Diagnosis present

## 2012-04-04 DIAGNOSIS — N269 Renal sclerosis, unspecified: Secondary | ICD-10-CM | POA: Diagnosis present

## 2012-04-04 DIAGNOSIS — M1990 Unspecified osteoarthritis, unspecified site: Secondary | ICD-10-CM | POA: Diagnosis present

## 2012-04-04 DIAGNOSIS — R569 Unspecified convulsions: Secondary | ICD-10-CM | POA: Diagnosis present

## 2012-04-04 DIAGNOSIS — M109 Gout, unspecified: Secondary | ICD-10-CM | POA: Diagnosis present

## 2012-04-04 DIAGNOSIS — R1319 Other dysphagia: Secondary | ICD-10-CM | POA: Diagnosis present

## 2012-04-04 DIAGNOSIS — I69922 Dysarthria following unspecified cerebrovascular disease: Secondary | ICD-10-CM

## 2012-04-04 DIAGNOSIS — Z8673 Personal history of transient ischemic attack (TIA), and cerebral infarction without residual deficits: Secondary | ICD-10-CM

## 2012-04-04 DIAGNOSIS — R21 Rash and other nonspecific skin eruption: Secondary | ICD-10-CM

## 2012-04-04 DIAGNOSIS — G9050 Complex regional pain syndrome I, unspecified: Secondary | ICD-10-CM | POA: Diagnosis present

## 2012-04-04 DIAGNOSIS — I6930 Unspecified sequelae of cerebral infarction: Secondary | ICD-10-CM

## 2012-04-04 DIAGNOSIS — R5381 Other malaise: Secondary | ICD-10-CM | POA: Diagnosis present

## 2012-04-04 DIAGNOSIS — R4701 Aphasia: Secondary | ICD-10-CM | POA: Diagnosis present

## 2012-04-04 DIAGNOSIS — R4789 Other speech disturbances: Secondary | ICD-10-CM

## 2012-04-04 DIAGNOSIS — J309 Allergic rhinitis, unspecified: Secondary | ICD-10-CM

## 2012-04-04 DIAGNOSIS — N179 Acute kidney failure, unspecified: Secondary | ICD-10-CM | POA: Diagnosis present

## 2012-04-04 DIAGNOSIS — I639 Cerebral infarction, unspecified: Secondary | ICD-10-CM

## 2012-04-04 DIAGNOSIS — G8190 Hemiplegia, unspecified affecting unspecified side: Secondary | ICD-10-CM

## 2012-04-04 DIAGNOSIS — Z993 Dependence on wheelchair: Secondary | ICD-10-CM

## 2012-04-04 DIAGNOSIS — E870 Hyperosmolality and hypernatremia: Secondary | ICD-10-CM

## 2012-04-04 DIAGNOSIS — E860 Dehydration: Secondary | ICD-10-CM | POA: Diagnosis present

## 2012-04-04 DIAGNOSIS — I69991 Dysphagia following unspecified cerebrovascular disease: Secondary | ICD-10-CM

## 2012-04-04 DIAGNOSIS — F3289 Other specified depressive episodes: Secondary | ICD-10-CM | POA: Diagnosis present

## 2012-04-04 DIAGNOSIS — N19 Unspecified kidney failure: Secondary | ICD-10-CM

## 2012-04-04 DIAGNOSIS — N289 Disorder of kidney and ureter, unspecified: Secondary | ICD-10-CM

## 2012-04-04 DIAGNOSIS — Z7982 Long term (current) use of aspirin: Secondary | ICD-10-CM

## 2012-04-04 DIAGNOSIS — R4781 Slurred speech: Secondary | ICD-10-CM

## 2012-04-04 DIAGNOSIS — I10 Essential (primary) hypertension: Secondary | ICD-10-CM | POA: Diagnosis present

## 2012-04-04 DIAGNOSIS — I69959 Hemiplegia and hemiparesis following unspecified cerebrovascular disease affecting unspecified side: Secondary | ICD-10-CM

## 2012-04-04 DIAGNOSIS — I69320 Aphasia following cerebral infarction: Secondary | ICD-10-CM

## 2012-04-04 DIAGNOSIS — I69920 Aphasia following unspecified cerebrovascular disease: Secondary | ICD-10-CM

## 2012-04-04 DIAGNOSIS — I63219 Cerebral infarction due to unspecified occlusion or stenosis of unspecified vertebral arteries: Principal | ICD-10-CM | POA: Diagnosis present

## 2012-04-04 DIAGNOSIS — I69998 Other sequelae following unspecified cerebrovascular disease: Secondary | ICD-10-CM

## 2012-04-04 DIAGNOSIS — N183 Chronic kidney disease, stage 3 unspecified: Secondary | ICD-10-CM | POA: Diagnosis present

## 2012-04-04 DIAGNOSIS — N185 Chronic kidney disease, stage 5: Secondary | ICD-10-CM

## 2012-04-04 DIAGNOSIS — I69391 Dysphagia following cerebral infarction: Secondary | ICD-10-CM

## 2012-04-04 DIAGNOSIS — F329 Major depressive disorder, single episode, unspecified: Secondary | ICD-10-CM

## 2012-04-04 DIAGNOSIS — I610 Nontraumatic intracerebral hemorrhage in hemisphere, subcortical: Secondary | ICD-10-CM | POA: Diagnosis present

## 2012-04-04 HISTORY — DX: Dysphagia following cerebral infarction: I69.391

## 2012-04-04 HISTORY — DX: Complex regional pain syndrome I, unspecified: G90.50

## 2012-04-04 HISTORY — DX: Unspecified osteoarthritis, unspecified site: M19.90

## 2012-04-04 HISTORY — DX: Major depressive disorder, single episode, unspecified: F32.9

## 2012-04-04 HISTORY — DX: Unspecified convulsions: R56.9

## 2012-04-04 HISTORY — DX: Aphasia: R47.01

## 2012-04-04 HISTORY — DX: Chronic kidney disease, unspecified: N18.9

## 2012-04-04 HISTORY — DX: Depression, unspecified: F32.A

## 2012-04-04 LAB — CBC WITH DIFFERENTIAL/PLATELET
Basophils Absolute: 0 10*3/uL (ref 0.0–0.1)
Basophils Relative: 1 % (ref 0–1)
Eosinophils Relative: 8 % — ABNORMAL HIGH (ref 0–5)
Lymphocytes Relative: 34 % (ref 12–46)
MCHC: 32.1 g/dL (ref 30.0–36.0)
Monocytes Absolute: 0.4 10*3/uL (ref 0.1–1.0)
Neutro Abs: 2.3 10*3/uL (ref 1.7–7.7)
Platelets: 138 10*3/uL — ABNORMAL LOW (ref 150–400)
RDW: 14.1 % (ref 11.5–15.5)
WBC: 4.7 10*3/uL (ref 4.0–10.5)

## 2012-04-04 LAB — COMPREHENSIVE METABOLIC PANEL
ALT: 46 U/L (ref 0–53)
AST: 22 U/L (ref 0–37)
Albumin: 3.7 g/dL (ref 3.5–5.2)
CO2: 22 mEq/L (ref 19–32)
Calcium: 9.3 mg/dL (ref 8.4–10.5)
Chloride: 104 mEq/L (ref 96–112)
Creatinine, Ser: 5.52 mg/dL — ABNORMAL HIGH (ref 0.50–1.35)
GFR calc non Af Amer: 11 mL/min — ABNORMAL LOW (ref >90)
Sodium: 140 mEq/L (ref 135–145)

## 2012-04-04 LAB — URINE MICROSCOPIC-ADD ON

## 2012-04-04 LAB — CREATININE, URINE, RANDOM: Creatinine, Urine: 201.07 mg/dL

## 2012-04-04 LAB — URINALYSIS, ROUTINE W REFLEX MICROSCOPIC
Glucose, UA: NEGATIVE mg/dL
Hgb urine dipstick: NEGATIVE
Ketones, ur: NEGATIVE mg/dL
Specific Gravity, Urine: 1.017 (ref 1.005–1.030)
pH: 5 (ref 5.0–8.0)

## 2012-04-04 LAB — POCT I-STAT TROPONIN I

## 2012-04-04 MED ORDER — ASPIRIN 300 MG RE SUPP
300.0000 mg | Freq: Once | RECTAL | Status: DC
  Filled 2012-04-04: qty 1

## 2012-04-04 MED ORDER — ASPIRIN 300 MG RE SUPP
300.0000 mg | Freq: Once | RECTAL | Status: AC
  Administered 2012-04-04: 300 mg via RECTAL

## 2012-04-04 MED ORDER — ASPIRIN 325 MG PO TABS
325.0000 mg | ORAL_TABLET | Freq: Every day | ORAL | Status: DC
  Administered 2012-04-04 – 2012-04-09 (×6): 325 mg via ORAL
  Filled 2012-04-04 (×7): qty 1

## 2012-04-04 MED ORDER — CITALOPRAM HYDROBROMIDE 10 MG PO TABS
10.0000 mg | ORAL_TABLET | Freq: Every day | ORAL | Status: DC
  Administered 2012-04-04 – 2012-04-09 (×6): 10 mg via ORAL
  Filled 2012-04-04 (×6): qty 1

## 2012-04-04 MED ORDER — DOXAZOSIN MESYLATE 8 MG PO TABS
8.0000 mg | ORAL_TABLET | Freq: Every day | ORAL | Status: DC
  Administered 2012-04-04 – 2012-04-09 (×6): 8 mg via ORAL
  Filled 2012-04-04 (×6): qty 1

## 2012-04-04 MED ORDER — IBUPROFEN 200 MG PO TABS: 600.0000 mg | ORAL_TABLET | Freq: Three times a day (TID) | ORAL | Status: DC | PRN

## 2012-04-04 MED ORDER — ACETAMINOPHEN 325 MG PO TABS
650.0000 mg | ORAL_TABLET | ORAL | Status: DC | PRN
  Administered 2012-04-06 – 2012-04-09 (×3): 650 mg via ORAL
  Filled 2012-04-04 (×3): qty 2

## 2012-04-04 MED ORDER — SODIUM CHLORIDE 0.9 % IV SOLN
INTRAVENOUS | Status: DC
  Administered 2012-04-04 – 2012-04-05 (×2): via INTRAVENOUS
  Administered 2012-04-05: 1000 mL via INTRAVENOUS
  Administered 2012-04-06 – 2012-04-07 (×3): via INTRAVENOUS

## 2012-04-04 MED ORDER — LORAZEPAM 2 MG/ML IJ SOLN
1.0000 mg | Freq: Once | INTRAMUSCULAR | Status: AC
  Administered 2012-04-04: 1 mg via INTRAVENOUS
  Filled 2012-04-04: qty 1

## 2012-04-04 MED ORDER — LORAZEPAM 1 MG PO TABS
1.0000 mg | ORAL_TABLET | Freq: Once | ORAL | Status: DC
  Filled 2012-04-04: qty 1

## 2012-04-04 MED ORDER — ONDANSETRON HCL 4 MG/2ML IJ SOLN: 4.0000 mg | Freq: Four times a day (QID) | INTRAMUSCULAR | Status: DC | PRN

## 2012-04-04 MED ORDER — ASPIRIN 300 MG RE SUPP
300.0000 mg | Freq: Every day | RECTAL | Status: DC
  Filled 2012-04-04 (×6): qty 1

## 2012-04-04 MED ORDER — FLUTICASONE PROPIONATE 50 MCG/ACT NA SUSP
1.0000 | Freq: Every day | NASAL | Status: DC
  Administered 2012-04-04 – 2012-04-09 (×6): 1 via NASAL
  Filled 2012-04-04: qty 16

## 2012-04-04 MED ORDER — GABAPENTIN 100 MG PO CAPS
100.0000 mg | ORAL_CAPSULE | Freq: Three times a day (TID) | ORAL | Status: DC
  Administered 2012-04-04 – 2012-04-09 (×18): 100 mg via ORAL
  Filled 2012-04-04 (×18): qty 1

## 2012-04-04 MED ORDER — TRAMADOL HCL 50 MG PO TABS
50.0000 mg | ORAL_TABLET | Freq: Four times a day (QID) | ORAL | Status: DC | PRN
  Administered 2012-04-05 – 2012-04-08 (×4): 50 mg via ORAL
  Filled 2012-04-04 (×5): qty 1

## 2012-04-04 MED ORDER — LEVETIRACETAM 100 MG/ML PO SOLN
500.0000 mg | Freq: Two times a day (BID) | ORAL | Status: DC
  Administered 2012-04-04 – 2012-04-09 (×12): 500 mg via ORAL
  Filled 2012-04-04 (×12): qty 5

## 2012-04-04 MED ORDER — TRAZODONE HCL 50 MG PO TABS
50.0000 mg | ORAL_TABLET | Freq: Every day | ORAL | Status: DC
  Administered 2012-04-04 – 2012-04-09 (×5): 50 mg via ORAL
  Filled 2012-04-04 (×7): qty 1

## 2012-04-04 MED ORDER — ACETAMINOPHEN 650 MG RE SUPP: 650.0000 mg | RECTAL | Status: DC | PRN

## 2012-04-04 MED ORDER — SENNOSIDES-DOCUSATE SODIUM 8.6-50 MG PO TABS: 1.0000 | ORAL_TABLET | Freq: Every evening | ORAL | Status: DC | PRN

## 2012-04-04 NOTE — ED Notes (Signed)
Pt transported to xray 

## 2012-04-04 NOTE — ED Notes (Signed)
Pt repositioned in the bed for comfort.  Linens changed.

## 2012-04-04 NOTE — Evaluation (Signed)
Clinical/Bedside Swallow Evaluation Patient Details  Name: Collin Diaz MRN: [id number] Date of Birth: December 14, 1955  Today's Date: 04/04/2012 Time: 1300-1325 SLP Time Calculation (min): 25 min  Past Medical History:  Past Medical History  Diagnosis Date  . Hypertension   . Arthritis 04/04/2012  . Seizure 04/04/2012    POST CVA 12/2011  . RSD (reflex sympathetic dystrophy) 04/04/2012    LEFT HAND  . Dysphagia S/P CVA (cerebrovascular accident) 04/04/2012  . Aphasia 04/04/2012    HX OF POST cva  . Stroke 10/2010    LEFT mca cva ISCHEMIC  . 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 Chronic kidney disease   . Depression   . RSD (reflex sympathetic dystrophy) 2013    LEFT HAND   Past Surgical History: History reviewed. No pertinent past surgical history. HPI:  The patient is a 56 y.o. 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 Patient had PEG for feedings and fluids until 03/25/12 (removed due to improved intake of solids, etc).  Was scheduled for an OPMBS 04/02/12  at [facility]. He is admitted with slurred speech and weakness in the right upper extremity, with finding of new stroke on MRI ( Small acute lacunar infarcts in the right corona radiata and inferior lentiform nuclei.)     Assessment / Plan / Recommendation Clinical Impression  Pt known to SLP services after admission this fall to acute care and CIR.  Bilateral CVAs resulted in dysphagia, severe, mixed dysarthria, and cognitive deficits.  Was scheduled for OPMBS on 12/17 but cancelled due to admission here.  Wife states purpose of study was to determine potential for consumption of thin liquids.  During today's clinical assessment, pt  presented with functional swallow of purees and nectar-thick liquids.  He has bilateral weakness in oral and pharyngeal musculature; ? New deficits overlying residual from prior CVAs.  He is safe to resume pre-admission diet of Dysphagia 2 and nectar-thick liquids; recommend MBS while here to determine readiness for diet upgrade.      Aspiration Risk  Mild    Diet Recommendation Dysphagia 2 (Fine chop);Nectar-thick liquid   Liquid Administration via: Cup Medication Administration: Crushed with puree Supervision:  (assist to feed PRN) Compensations: Slow rate;Small sips/bites Postural Changes and/or Swallow Maneuvers: Seated upright 90 degrees;Upright 30-60 min after meal    Other  Recommendations Recommended Consults: MBS Oral Care Recommendations: Oral care BID Other Recommendations: Have oral suction available   Follow Up Recommendations          Pertinent Vitals/Pain No pain    SLP Swallow Goals Patient will utilize recommended strategies during swallow to increase swallowing safety with: Supervision/safety   Swallow Study Prior Functional Status       General HPI: The patient is a 56 y.o. 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 Patient had PEG for feedings and fluids until 03/25/12 (removed due to improved intake of solids, etc).  Was scheduled for an OPMBS yesterday  at [facility]. He is admitted on the current occasion with slurred speech and weakness in the right upper extremity, with finding of new stroke on MRI ( Small acute lacunar infarcts in the right corona radiata and inferior lentiform nuclei.)   Type of Study: Bedside swallow evaluation Previous Swallow Assessment: 01/18/12:   Patient demonstrated a  functional improvement as compared to previous objective test. He exhibited moderate-severe oral dysphagia with decreased lingual manipulation leading to reduced anterior-posterior movement of bolus and delayed oral transit. Mild pharyngeal dysphagia is characterized by decreased pharyngeal sensation resulting in trace silent aspiration with thin liquids via cup. Patient was unable to produce cough despite max assist verbal and tactile cues.  Swallow was initiated at the level of the valleculae with solids and at the pyriform sinuses with liquids and no reside was observed post swallow. SLP recommends patient continue Dys. 1 textures and upgrade to nectar thick liquids via c Diet Prior to this Study: Dysphagia 2 (chopped);Nectar-thick liquids Temperature Spikes Noted: No Respiratory Status: Room air Behavior/Cognition: Alert;Impulsive Oral Cavity - Dentition: Adequate natural dentition Self-Feeding Abilities: Needs assist Patient Positioning: Upright in bed Baseline Vocal Quality: Low vocal intensity Volitional Cough: Weak Volitional Swallow: Able to elicit    Oral/Motor/Sensory Function Overall Oral Motor/Sensory Function: Impaired (decreased bilateral function/ROM and strength in oral musculature; mild decrease CN VII on left)   Ice Chips Ice chips: Not tested   Thin Liquid Thin Liquid: Not tested    Nectar Thick Nectar Thick Liquid: Impaired Presentation: Cup;Self Fed Pharyngeal Phase Impairments: Throat Clearing - Immediate (1/5 trials)   Honey Thick Honey Thick Liquid: Not tested   Puree Puree: Impaired Pharyngeal Phase Impairments: Multiple swallows   Solid   GO    Solid: Not tested      Collin Diaz Collin Diaz Collin Diaz, Collin Diaz CCC/SLP Pager [phone]  Blenda Mounts Collin Diaz 04/04/2012,1:43 PM

## 2012-04-04 NOTE — Consult Note (Addendum)
Referring Physician:  Janee Morn    Chief Complaint: Increased right sided weakness  HPI:                                                                                                                                         Collin Diaz is an 56 y.o. male with two previous CVA's in the past. On 10/21/2011  He suffered a ischemic stroke  in the left MCA distribution while in [place], [place] and then right BG infarct on 01/03/2012 with hemorrhagic transformation and cytotoxic edema causing transfalcine herniation necessitating hypertonic saline.  He was left with "left hemiparesis, expressive aphasia and sever dysarthria" per stroke team note.  During that hospitalization he was sent to CIR and antiplatelets were held. Patietn was baseline (wheel chair and bed bound-per wife) yesterday until about 12 noon when she noted his speech was increasingly dysarthric, he seemed increasing lethargy and complaining of right arm pain (seen in ED on 04/01/12 for left arm pain). He was brought to ED for further evaluation. On admission his SBP ranged from 97-130, Afebrile.   Patient speaks in a whisper but tells me that his right arm and leg have been weaker than usual.  He cannot give me an exact time of onset. He also complains of significant pain in his right shoulder and cringes when I minipulate his right shoulder.   He states he does not have any decreased sensation in right arm or leg.    From notes, patient had a period of unresponsiveness with SBP in 70's while in CIR, question of siezure was brought up and patient was placed on Keppra. Negative EEG at that time.  Cr is 5.52 and last baseline was 2.2   LSN: 04/03/12 tPA Given: No: history of intracranial bleed and out of window.   Past Medical History  Diagnosis Date  . Hypertension   . Stroke   . Arthritis 04/04/2012  . Seizure 04/04/2012    POST CVA 12/2011  . RSD (reflex sympathetic dystrophy) 04/04/2012    LEFT HAND  . Dysphagia S/P  CVA (cerebrovascular accident) 04/04/2012  . Aphasia 04/04/2012    HX OF POST cva    History reviewed. No pertinent past surgical history.  Family history: Mother: DM, HTN Father: Stroke, MI  Social History:  reports that he has never smoked. He does not have any smokeless tobacco history on file. He reports that he drinks alcohol. He reports that he does not use illicit drugs.  Allergies:  Allergies  Allergen Reactions  . Nsaids     Unknown FAMILY DENIES.    Medications:  No current facility-administered medications for this encounter.   Current Outpatient Prescriptions  Medication Sig Dispense Refill  . citalopram (CELEXA) 10 MG tablet Take 10 mg by mouth daily.      Marland Kitchen doxazosin (CARDURA) 8 MG tablet Take 8 mg by mouth at bedtime.      . fluticasone (FLONASE) 50 MCG/ACT nasal spray Place 1 spray into the nose daily.      Marland Kitchen gabapentin (NEURONTIN) 100 MG capsule Take 1 capsule (100 mg total) by mouth 3 (three) times daily.  90 capsule  4  . hydrochlorothiazide (MICROZIDE) 12.5 MG capsule Take 12.5 mg by mouth 2 (two) times daily.      Marland Kitchen ibuprofen (ADVIL,MOTRIN) 800 MG tablet Take 800 mg by mouth every 8 (eight) hours as needed. For pain      . levETIRAcetam (KEPPRA) 100 MG/ML solution Take 500 mg by mouth 2 (two) times daily.      Marland Kitchen lisinopril (PRINIVIL,ZESTRIL) 5 MG tablet Take 5 mg by mouth 2 (two) times daily.       . traMADol (ULTRAM) 50 MG tablet Take 50 mg by mouth 4 (four) times daily as needed. For pain      . traZODone (DESYREL) 50 MG tablet Take 50 mg by mouth at bedtime.      . Nutritional Supplements (FEEDING SUPPLEMENT, OSMOLITE 1.5 CAL,) LIQD Place 1,000 mLs into feeding tube continuous.         ROS:                                                                                                                                       History  obtained from the patient  General ROS: negative for - chills, fatigue, fever, night sweats, weight gain or weight loss Psychological ROS: negative for - behavioral disorder, hallucinations, memory difficulties, mood swings or suicidal ideation Ophthalmic ROS: negative for - blurry vision, double vision, eye pain or loss of vision ENT ROS: negative for - epistaxis, nasal discharge, oral lesions, sore throat, tinnitus or vertigo Allergy and Immunology ROS: negative for - hives or itchy/watery eyes Hematological and Lymphatic ROS: negative for - bleeding problems, bruising or swollen lymph nodes Endocrine ROS: negative for - galactorrhea, hair pattern changes, polydipsia/polyuria or temperature intolerance Respiratory ROS: negative for - cough, hemoptysis, shortness of breath or wheezing Cardiovascular ROS: negative for - chest pain, dyspnea on exertion, edema or irregular heartbeat Gastrointestinal ROS: negative for - abdominal pain, diarrhea, hematemesis, nausea/vomiting or stool incontinence Genito-Urinary ROS: negative for - dysuria, hematuria, incontinence or urinary frequency/urgency Musculoskeletal ROS: positive for - joint pain, muscular weakness and pain Neurological ROS: as noted in HPI Dermatological ROS: negative for rash and skin lesion changes  Neurologic Examination:  Blood pressure 130/93, pulse 87, temperature 96.9 F (36.1 C), temperature source Rectal, resp. rate 10, SpO2 100.00%.  Mental Status: Alert, not oriented to place, able to tell me it is 2013.  Speech short, speaks in a soft voice with decreased articulation.  Dysarthric.  He is able to obey verbal commands and name objects.  Cranial Nerves: II: Discs flat bilaterally; Visual fields grossly normal, pupils equal, round, reactive to light and accommodation III,IV, VI: ptosis not present, extra-ocular motions intact  bilaterally V,VII: left facial droop.  facial light touch sensation normal bilaterally VIII: hearing normal bilaterally IX,X: gag reflex reduced XI: shoulder shrug decreased on the left XII: midline tongue extension Motor: Right : Upper extremity   4-/5    Left:     Upper extremity   Unable to move and increased tone (previous stroke)  Lower extremity   4-/5     Lower extremity   Small withdrawl movements to pain/ not antigravity (previous stroke)  Tone and bulk:normal tone throughout; no atrophy noted Sensory: Pinprick and light touch intact throughout but decreased along right arm and leg when compared to left.  DSS intact. Deep Tendon Reflexes: 2+ and symmetric throughout, 1+ bilateral KJ and AJ (R>L) Plantars: Right: downgoing   Left: upgoing Cerebellar: normal finger-to-nose on right hand,  normal heel-to-shin test with right leg CV: pulses palpable throughout    Results for orders placed during the hospital encounter of 04/04/12 (from the past 48 hour(s))  CBC WITH DIFFERENTIAL     Status: Abnormal   Collection Time   04/04/12  4:45 AM      Component Value Range Comment   WBC 4.7  4.0 - 10.5 K/uL    RBC 4.34  4.22 - 5.81 MIL/uL    Hemoglobin 12.6 (*) 13.0 - 17.0 g/dL    HCT 16.1  09.6 - 04.5 %    MCV 90.3  78.0 - 100.0 fL    MCH 29.0  26.0 - 34.0 pg    MCHC 32.1  30.0 - 36.0 g/dL    RDW 40.9  81.1 - 91.4 %    Platelets 138 (*) 150 - 400 K/uL    Neutrophils Relative 50  43 - 77 %    Neutro Abs 2.3  1.7 - 7.7 K/uL    Lymphocytes Relative 34  12 - 46 %    Lymphs Abs 1.6  0.7 - 4.0 K/uL    Monocytes Relative 8  3 - 12 %    Monocytes Absolute 0.4  0.1 - 1.0 K/uL    Eosinophils Relative 8 (*) 0 - 5 %    Eosinophils Absolute 0.4  0.0 - 0.7 K/uL    Basophils Relative 1  0 - 1 %    Basophils Absolute 0.0  0.0 - 0.1 K/uL   COMPREHENSIVE METABOLIC PANEL     Status: Abnormal   Collection Time   04/04/12  4:45 AM      Component Value Range Comment   Sodium 140  135 - 145 mEq/L     Potassium 4.8  3.5 - 5.1 mEq/L    Chloride 104  96 - 112 mEq/L    CO2 22  19 - 32 mEq/L    Glucose, Bld 96  70 - 99 mg/dL    BUN 59 (*) 6 - 23 mg/dL    Creatinine, Ser 7.82 (*) 0.50 - 1.35 mg/dL    Calcium 9.3  8.4 - 95.6 mg/dL    Total Protein 7.1  6.0 -  8.3 g/dL    Albumin 3.7  3.5 - 5.2 g/dL    AST 22  0 - 37 U/L    ALT 46  0 - 53 U/L    Alkaline Phosphatase 142 (*) 39 - 117 U/L    Total Bilirubin 0.5  0.3 - 1.2 mg/dL    GFR calc non Af Amer 11 (*) >90 mL/min    GFR calc Af Amer 12 (*) >90 mL/min   URINALYSIS, ROUTINE W REFLEX MICROSCOPIC     Status: Abnormal   Collection Time   04/04/12  4:47 AM      Component Value Range Comment   Color, Urine YELLOW  YELLOW    APPearance CLOUDY (*) CLEAR    Specific Gravity, Urine 1.017  1.005 - 1.030    pH 5.0  5.0 - 8.0    Glucose, UA NEGATIVE  NEGATIVE mg/dL    Hgb urine dipstick NEGATIVE  NEGATIVE    Bilirubin Urine SMALL (*) NEGATIVE    Ketones, ur NEGATIVE  NEGATIVE mg/dL    Protein, ur NEGATIVE  NEGATIVE mg/dL    Urobilinogen, UA 1.0  0.0 - 1.0 mg/dL    Nitrite NEGATIVE  NEGATIVE    Leukocytes, UA SMALL (*) NEGATIVE   URINE MICROSCOPIC-ADD ON     Status: Abnormal   Collection Time   04/04/12  4:47 AM      Component Value Range Comment   Squamous Epithelial / LPF RARE  RARE    WBC, UA 3-6  <3 WBC/hpf    RBC / HPF 0-2  <3 RBC/hpf    Bacteria, UA RARE  RARE    Casts HYALINE CASTS (*) NEGATIVE   PRO B NATRIURETIC PEPTIDE     Status: Normal   Collection Time   04/04/12  6:33 AM      Component Value Range Comment   Pro B Natriuretic peptide (BNP) 116.6  0 - 125 pg/mL   POCT I-STAT TROPONIN I     Status: Normal   Collection Time   04/04/12  6:41 AM      Component Value Range Comment   Troponin i, poc 0.01  0.00 - 0.08 ng/mL    Comment 3             Dg Shoulder Right  04/04/2012  *RADIOLOGY REPORT*  Clinical Data: Right shoulder pain  RIGHT SHOULDER - 2+ VIEW  Comparison: None.  Findings: Acromioclavicular degenerative  change.  Glenohumeral joint remains located.  Right upper lung is clear.  No acute fracture.  IMPRESSION: No acute osseous abnormality of the right shoulder.  Acromioclavicular DJD.   Original Report Authenticated By: Jearld Lesch, M.D.    Dg Chest Port 1 View  04/04/2012  *RADIOLOGY REPORT*  Clinical Data: Right shoulder pain  PORTABLE CHEST - 1 VIEW  Comparison: 12/30/2011  Findings: Prominent cardiomediastinal contours, similar to prior. Interstitial prominence.  Of the hemidiaphragms.  Interstitial and vascular crowding.  Multilevel degenerative changes.  No pneumothorax.  No definite pleural effusion.  No acute osseous finding.  IMPRESSION: Prominent cardiomediastinal contours, similar to prior.  Mild interstitial prominence may reflect chronic change or mild edema.   Original Report Authenticated By: Jearld Lesch, M.D.    Felicie Morn PA-C [facility] [phone]  04/04/2012, 9:04 AM   Patient seen and examined.  Clinical course and management discussed.  Necessary edits performed.  I agree with the above.  Assessment and plan of care developed and discussed below.    Assessment: 56  y.o. male with a history of a left hemiplegia as a result of an old right infarct who now presents with new right sided weakness and worsening of speech.  Imaging reveals small acute lacunar infarcts in the right corona radiata and inferior lentiform nuclei.  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  Patient on no antiplatelet therapy due to a hemorrhagic infarct in September of this year.    Stroke Risk Factors - hypertension and stroke  Plan: 1. HgbA1c, fasting lipid panel 2. PT consult, OT consult, Speech consult 3. Echocardiogram 4. Carotid dopplers 5. Prophylactic therapy-Restart ASA 6. Risk factor modification 7. Telemetry monitoring 8. Frequent neuro checks  Case and management discussed with Dr. Lenda Kelp, MD [facility] [phone]  04/04/2012  11:48 AM

## 2012-04-04 NOTE — Consult Note (Addendum)
Requesting Physician:  Dr. Jerelyn Charles Reason for Consult:  Acute on chronic renal insufficiency  HPI: The patient is a 56 y.o. 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   Has h/o CKD with a baseline creatinine in the 2's but has had prior episode of acute renal insufficiency during September admission for stroke with creatinine that peaked at 3.48. According to prior d/c summary this was attributed to volume depletion (and possible a componentt of urinary retention for which he required foley placement).   Creatinine near  the time of discharge from rehab was 1.92 (October 28)   Patient had PEG for feedings and fluids until 03/25/12 (removed due to improved intake of solids, etc)  He is admitted on the current occasion with slurred speech and weakness in the right upper extremity, with finding of new stroke on MRI  Creatinine on admission was noted to be 5.52, up from 2.27 3 days ago (seen in ED for shoulder pain)  and we are asked to see.  A foley has been placed, and Dr. Janee Morn has ordered appropriate studies, stopped the ACE inhibitor and ordered slow IVF at 75 cc/hour.  It should be noted that patient was receiving lisinopril at home for hypotension, taking prn ibuprofen. Blood pressures in the ED on presentation early this AM were low - in the 90's. Wife took BP this AM - was in the 90's prior to coming to the ER (other pressures this week in the one teens)  Creatinine, Ser  Date/Time Value Range Status  04/04/2012  4:45 AM 5.52* 0.50 - 1.35 mg/dL Final  40/98/1191  4:78 AM 2.27* 0.50 - 1.35 mg/dL Final  29/56/2130 86:57 PM 1.92* 0.50 - 1.35 mg/dL Final  84/69/6295  2:84 AM 2.02* 0.50 - 1.35 mg/dL Final  13/24/4010  27:25 AM 2.72* 0.50 - 1.35 mg/dL Final  36/09/4401  4:74 AM 2.40* 0.50 - 1.35 mg/dL Final  25/12/5636  7:56 AM 2.02* 0.50 - 1.35 mg/dL Final  4/33/2951  8:84 AM 3.48* 0.50 - 1.35 mg/dL Final  1/66/0630  1:60 PM 3.49* 0.50 - 1.35 mg/dL Final  04/25/3233  5:73 AM 3.51* 0.50 - 1.35 mg/dL Final  06/06/2540  7:06 AM 2.60* 0.50 - 1.35 mg/dL Final  2/37/6283  1:51 PM 2.16* 0.50 - 1.35 mg/dL Final  7/61/6073 71:06 AM 2.09* 0.50 - 1.35 mg/dL Final  2/69/4854  6:27 AM 2.05* 0.50 - 1.35 mg/dL Final  0/35/0093 81:82 AM 2.00* 0.50 - 1.35 mg/dL Final  9/93/7169  6:78 PM 2.01* 0.50 - 1.35 mg/dL Final  9/38/1017 51:02 PM 2.05* 0.50 - 1.35 mg/dL Final  5/85/2778  2:42 AM 2.18* 0.50 - 1.35 mg/dL Final  3/53/6144 31:54 AM 2.14* 0.50 - 1.35 mg/dL Final  0/11/6759  9:50 PM 2.17* 0.50 - 1.35 mg/dL Final  9/32/6712  4:58 PM 2.20* 0.50 - 1.35 mg/dL Final  09/98/3382  5:05 PM 1.70* 0.40-1.50 mg/dL Final  3/97/6734  1:93 AM 2.3*  Final  05/05/2006 2.13   Final  02/17/2006 2.33   Final    Past Medical History:  Past Medical History  Diagnosis Date  . Hypertension   . Arthritis 04/04/2012  . Seizure 04/04/2012    POST CVA 12/2011  . RSD (reflex sympathetic dystrophy) 04/04/2012  LEFT HAND  . Dysphagia S/P CVA (cerebrovascular accident) 04/04/2012  . Aphasia 04/04/2012    HX OF POST cva  . Stroke 10/2010    LEFT mca cva ISCHEMIC  . 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 Chronic kidney disease   . Depression   . RSD (reflex sympathetic dystrophy) 2013    LEFT HAND    Past Surgical History: History reviewed. No pertinent past surgical history.  Family History: History reviewed. No pertinent family history. Social History:  reports that he has never smoked. He does not have any smokeless tobacco history on file. He reports that he drinks alcohol. He reports that he does not use illicit drugs.  Allergies:  Allergies  Allergen Reactions  . Nsaids     Unknown FAMILY DENIES.     Home medications: Prior to Admission medications   Medication Sig Start Date End Date Taking? Authorizing Provider  citalopram (CELEXA) 10 MG tablet Take 10 mg by mouth daily.   Yes Historical Provider, MD  doxazosin (CARDURA) 8 MG tablet Take 8 mg by mouth at bedtime.   Yes Historical Provider, MD  fluticasone (FLONASE) 50 MCG/ACT nasal spray Place 1 spray into the nose daily.   Yes Historical Provider, MD  gabapentin (NEURONTIN) 100 MG capsule Take 1 capsule (100 mg total) by mouth 3 (three) times daily. 03/28/12  Yes Erick Colace, MD  hydrochlorothiazide (MICROZIDE) 12.5 MG capsule Take 12.5 mg by mouth 2 (two) times daily.   Yes Historical Provider, MD  ibuprofen (ADVIL,MOTRIN) 800 MG tablet Take 800 mg by mouth every 8 (eight) hours as needed. For pain   Yes Historical Provider, MD  levETIRAcetam (KEPPRA) 100 MG/ML solution Take 500 mg by mouth 2 (two) times daily.   Yes Historical Provider, MD  lisinopril (PRINIVIL,ZESTRIL) 5 MG tablet Take 5 mg by mouth 2 (two) times daily.    Yes Historical Provider, MD  traMADol (ULTRAM) 50 MG tablet Take 50 mg by mouth 4 (four) times daily as needed. For pain   Yes Historical Provider, MD  traZODone (DESYREL) 50 MG tablet Take 50 mg by mouth at bedtime.   Yes Historical Provider, MD  Nutritional Supplements (FEEDING SUPPLEMENT, OSMOLITE 1.5 CAL,) LIQD Place 1,000 mLs into feeding tube continuous.    Historical Provider, MD     Review of Systems Somewhat difficult to attain due to pt falling asleep and some expressive aphasia but able to respond to some focused questions Positive for difficulty starting urinary stream Some dysuria No CP or SOB Pt does not walk but says was able to sit in wheel chair without dizziness or lightheadedness Says "eats good" since PEG out but unclear how much has been eating past few days Denies abdominal pain  PHYSICAL EXAMINATIOM BP 116/92  Pulse 60  Temp 97.7 F (36.5 C) (Oral)  Resp 16  SpO2  100% Patient yawning frequently Speech somewhat hard to understand because very soft voice  VS as above (note BP's earlier iin the ED in the 90's No JVD in the supine position Lungs grossly clear to auscultation though could not get pt to take a good deep breath Precordium quiet Rhythm regular S1S2 no S3 noted No audible murmur Abdomen non distended Old PEG site healing without drainage No edema whatsoever of the lower extremities NSL in right UE but no IVF's infusing Left side paretic Can't really assess orientation though he know he is not at home Foley draining clear urine  Labs: Basic Metabolic Panel:  Lab 04/04/12 9147 04/01/12 0930  NA 140 144  K 4.8 4.3  CL 104 106  CO2 22 24  GLUCOSE 96 111*  BUN 59* 27*  CREATININE 5.52* 2.27*  ALB -- --  CALCIUM 9.3 10.0  PHOS -- --   Liver Function Tests:  Lab 04/04/12 0445  AST 22  ALT 46  ALKPHOS 142*  BILITOT 0.5  PROT 7.1  ALBUMIN 3.7  CBC:  Lab 04/04/12 0445 04/01/12 0930  WBC 4.7 3.7*  NEUTROABS 2.3 1.7  HGB 12.6* 13.7  HCT 39.2 41.0  MCV 90.3 89.3  PLT 138* 162   Results for Collin Diaz, Collin Diaz (MRN [id number]) as of 04/04/2012 11:41  Ref. Range 04/04/2012 04:47  Color, Urine Latest Range: YELLOW  YELLOW  APPearance Latest Range: CLEAR  CLOUDY (A)  Specific Gravity, Urine Latest Range: 1.005-1.030  1.017  pH Latest Range: 5.0-8.0  5.0  Glucose Latest Range: NEGATIVE mg/dL NEGATIVE  Bilirubin Urine Latest Range: NEGATIVE  SMALL (A)  Ketones, ur Latest Range: NEGATIVE mg/dL NEGATIVE  Protein Latest Range: NEGATIVE mg/dL NEGATIVE  Urobilinogen, UA Latest Range: 0.0-1.0 mg/dL 1.0  Nitrite Latest Range: NEGATIVE  NEGATIVE  Leukocytes, UA Latest Range: NEGATIVE  SMALL (A)  Hgb urine dipstick Latest Range: NEGATIVE  NEGATIVE  WBC, UA Latest Range: <3 WBC/hpf 3-6  RBC / HPF Latest Range: <3 RBC/hpf 0-2  Squamous Epithelial / LPF Latest Range: RARE  RARE  Bacteria, UA Latest Range: RARE  RARE  Casts Latest  Range: NEGATIVE  HYALINE CASTS (A)    Xrays/Other Studies: Dg Shoulder Right  04/04/2012  *RADIOLOGY REPORT*  Clinical Data: Right shoulder pain  RIGHT SHOULDER - 2+ VIEW  Comparison: None.  Findings: Acromioclavicular degenerative change.  Glenohumeral joint remains located.  Right upper lung is clear.  No acute fracture.  IMPRESSION: No acute osseous abnormality of the right shoulder.  Acromioclavicular DJD.   Original Report Authenticated By: Jearld Lesch, M.D.    Ct Head Wo Contrast  04/04/2012  *RADIOLOGY REPORT*  Clinical Data: Slurred speech and right upper extremity weakness and numbness.  CT HEAD WITHOUT CONTRAST  Technique:  Contiguous axial images were obtained from the base of the skull through the vertex without contrast.  Comparison: 03/14/2012.  Findings: The ventricles are stable.  Asymmetric enlargement of the left lateral ventricle is again noted.  Advanced periventricular white matter disease for age and remote lacunar type infarcts.  No CT findings for acute hemispheric infarction and/or intracranial hemorrhage.  No extra-axial fluid collections.  The brainstem and cerebellum are grossly normal and stable.  No mass lesions.  The bony structures are intact.  The paranasal sinuses mastoid air cells are clear.  IMPRESSION:  1.  Significant chronic white matter changes for age. 2.  Stable remote lacunar type infarcts. 3.  No CT findings for acute hemispheric infarction or intracranial hemorrhage.   Original Report Authenticated By: Rudie Meyer, M.D.    Mr Brain Wo Contrast  04/04/2012  *RADIOLOGY REPORT*  Clinical Data:  56 year old male who is unresponsive.  Slurred speech and right side weakness.  Comparison: Head CTs 04/04/2012 and earlier.  MRI HEAD WITHOUT CONTRAST  Technique: Multiplanar, multiecho pulse sequences of the brain and surrounding structures were obtained according to standard protocol without intravenous contrast.  Findings: Multifocal chronic bilateral deep gray  matter nuclei hemorrhages and lacunar infarcts, see series 9.  Associated deep gray matter encephalomalacia and ex vacuo enlargement of the left lateral ventricle.  Additional  confluent diffuse white matter T2 and FLAIR hyperintensity.  Similar confluent T2 signal in the pons.  There are small areas of restricted diffusion at the inferior right lentiform nuclei (series 5 image 14) and along the right periventricular white matter (image 18).  Major intracranial vascular flow voids are preserved.  Negative pituitary, cervicomedullary junction and visualized cervical spine. Normal bone marrow signal.  Hyperostosis of the calvarium. Scattered additional chronic micro hemorrhages in the brain other than those described above. No acute intracranial hemorrhage identified.  Visualized orbit soft tissues are within normal limits.  Minor maxillary sinus mucosal thickening.  Other Visualized paranasal sinuses and mastoids are clear.  Small volume retained secretions in the pharynx.  Negative scalp soft tissues.  IMPRESSION: 1.  Small acute lacunar infarcts in the right corona radiata and inferior lentiform nuclei.  No mass effect or acute hemorrhage. 2.  Underlying very severe chronic small vessel disease with multiple chronic deep gray matter hemorrhages. 3.  MRA findings are below.  MRA HEAD WITHOUT CONTRAST  Technique: Angiographic images of the Circle of Willis were obtained using MRA technique without  intravenous contrast.  Findings: Antegrade flow in the distal right vertebral artery without stenosis.  Poorer/intermittent antegrade flow in the distal left vertebral artery.  Little to no flow in the left V4 segment. The right PICA origin is patent.  The left PICA is not identified. The left AICA origin is patent and that vessel may be dominant.  Basilar artery irregularity without significant stenosis.  SCA and left PCA origins normal.  Fetal type right PCA.  Bilateral PCA branches are within normal limits.  Antegrade  flow in both ICA siphons.  Right ICA mildly dominant probably owing to the fetal right PCA type anatomy.  No ICA stenosis.  Ophthalmic artery origins and right posterior communicating artery origin are within normal limits.  Normal carotid termini.  Normal MCA and ACA origins.  Diminutive or absent anterior communicating artery.  Visualized ACA branches are within normal limits.  Visualized bilateral MCA branches are within normal limits.  IMPRESSION: 1.  Distal left vertebral artery high-grade stenosis and/or occlusion.  Favor chronic given absence of associated acute MRI findings in the posterior fossa. 2.  Mild distal right vertebral artery atherosclerosis without significant stenosis. 3.  Otherwise negative intracranial MRA.   Original Report Authenticated By: Erskine Speed, M.D.    Dg Chest Port 1 View  04/04/2012  *RADIOLOGY REPORT*  Clinical Data: Right shoulder pain  PORTABLE CHEST - 1 VIEW  Comparison: 12/30/2011  Findings: Prominent cardiomediastinal contours, similar to prior. Interstitial prominence.  Of the hemidiaphragms.  Interstitial and vascular crowding.  Multilevel degenerative changes.  No pneumothorax.  No definite pleural effusion.  No acute osseous finding.  IMPRESSION: Prominent cardiomediastinal contours, similar to prior.  Mild interstitial prominence may reflect chronic change or mild edema.   Original Report Authenticated By: Jearld Lesch, M.D.    Mr Mra Head/brain Wo Cm  04/04/2012  *RADIOLOGY REPORT*  Clinical Data:  56 year old male who is unresponsive.  Slurred speech and right side weakness.  Comparison: Head CTs 04/04/2012 and earlier.  MRI HEAD WITHOUT CONTRAST  Technique: Multiplanar, multiecho pulse sequences of the brain and surrounding structures were obtained according to standard protocol without intravenous contrast.  Findings: Multifocal chronic bilateral deep gray matter nuclei hemorrhages and lacunar infarcts, see series 9.  Associated deep gray matter  encephalomalacia and ex vacuo enlargement of the left lateral ventricle.  Additional confluent diffuse white matter T2 and FLAIR  hyperintensity.  Similar confluent T2 signal in the pons.  There are small areas of restricted diffusion at the inferior right lentiform nuclei (series 5 image 14) and along the right periventricular white matter (image 18).  Major intracranial vascular flow voids are preserved.  Negative pituitary, cervicomedullary junction and visualized cervical spine. Normal bone marrow signal.  Hyperostosis of the calvarium. Scattered additional chronic micro hemorrhages in the brain other than those described above. No acute intracranial hemorrhage identified.  Visualized orbit soft tissues are within normal limits.  Minor maxillary sinus mucosal thickening.  Other Visualized paranasal sinuses and mastoids are clear.  Small volume retained secretions in the pharynx.  Negative scalp soft tissues.  IMPRESSION: 1.  Small acute lacunar infarcts in the right corona radiata and inferior lentiform nuclei.  No mass effect or acute hemorrhage. 2.  Underlying very severe chronic small vessel disease with multiple chronic deep gray matter hemorrhages. 3.  MRA findings are below.  MRA HEAD WITHOUT CONTRAST  Technique: Angiographic images of the Circle of Willis were obtained using MRA technique without  intravenous contrast.  Findings: Antegrade flow in the distal right vertebral artery without stenosis.  Poorer/intermittent antegrade flow in the distal left vertebral artery.  Little to no flow in the left V4 segment. The right PICA origin is patent.  The left PICA is not identified. The left AICA origin is patent and that vessel may be dominant.  Basilar artery irregularity without significant stenosis.  SCA and left PCA origins normal.  Fetal type right PCA.  Bilateral PCA branches are within normal limits.  Antegrade flow in both ICA siphons.  Right ICA mildly dominant probably owing to the fetal right PCA  type anatomy.  No ICA stenosis.  Ophthalmic artery origins and right posterior communicating artery origin are within normal limits.  Normal carotid termini.  Normal MCA and ACA origins.  Diminutive or absent anterior communicating artery.  Visualized ACA branches are within normal limits.  Visualized bilateral MCA branches are within normal limits.  IMPRESSION: 1.  Distal left vertebral artery high-grade stenosis and/or occlusion.  Favor chronic given absence of associated acute MRI findings in the posterior fossa. 2.  Mild distal right vertebral artery atherosclerosis without significant stenosis. 3.  Otherwise negative intracranial MRA.   Original Report Authenticated By: Erskine Speed, M.D.     ASSESSMENT/RECOMMENDATIONS: 56 y.o. 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  Urinary retention not ruled out as possible contributor  1. AKI on CKD  AKI  likely multifactorial (ACE, NSAID, possible urinary retention)  Also looks dry on exam to me and unclear to me what PO intake has been since PEG removed on 12//9/13  Agree with foley, [place], IVF, SPEP, UPEP, urine lytes as ordered by Dr. Janee Morn  Agree with D/C of ACE inhibitor and NSAIDS 2.. Baseline CKD  Etiology of baseline CKD not known but I suspect hypertension  Agree with workup ordered 3. 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 HTN  ACE  probably not best drug for this patient (suspect propensity to volume depletion with strokes/swallowing issues  Currently appropriate to hold ALL BP meds with low BP's  Camille Bal,  MD [facility] [phone] pager 04/04/2012, 10:50 AM

## 2012-04-04 NOTE — ED Notes (Signed)
Hospitalist at bedside 

## 2012-04-04 NOTE — ED Notes (Signed)
Inserted foley cath size 14 french into patient cloudy yellow urine in return

## 2012-04-04 NOTE — ED Notes (Signed)
MRI called.  Pt will be returning from MRI in 10 minutes. Will transport to floor at that time. Report given to RN

## 2012-04-04 NOTE — ED Notes (Signed)
After examining patient this RN made hospitalist aware of my exam.

## 2012-04-04 NOTE — ED Provider Notes (Signed)
History     CSN: [id number]  Arrival date & time 04/04/12  0236   First MD Initiated Contact with Patient 04/04/12 0244      Chief Complaint  Patient presents with  . Shoulder Pain    (Consider location/radiation/quality/duration/timing/severity/associated sxs/prior treatment) Patient is a 56 y.o. male presenting with shoulder pain. The history is provided by the patient.  Shoulder Pain   patient here with right shoulder pain which started just prior to arrival. No recent history of trauma. Patient is confined to his bed secondary to a prior CVA which left him with left-sided paralysis. Denies any chest pain or chest pressure. No shortness of breath or diaphoresis. Pain characterized as sharp and worse with movement. Denies any rashes. No prior history of same. Patient called EMS and was transported here  Past Medical History  Diagnosis Date  . Hypertension   . Stroke     History reviewed. No pertinent past surgical history.  No family history on file.  History  Substance Use Topics  . Smoking status: Never Smoker   . Smokeless tobacco: Not on file  . Alcohol Use: Yes      Review of Systems  All other systems reviewed and are negative.    Allergies  Nsaids  Home Medications   Current Outpatient Rx  Name  Route  Sig  Dispense  Refill  . CITALOPRAM HYDROBROMIDE 10 MG PO TABS   Oral   Take 10 mg by mouth daily.         Marland Kitchen DOXAZOSIN MESYLATE 4 MG PO TABS   Oral   Take 4 mg by mouth at bedtime.         Marland Kitchen FLUTICASONE PROPIONATE 50 MCG/ACT NA SUSP   Nasal   Place 1 spray into the nose daily.         Marland Kitchen GABAPENTIN 100 MG PO CAPS   Oral   Take 1 capsule (100 mg total) by mouth 3 (three) times daily.   90 capsule   4   . HYDROCHLOROTHIAZIDE 12.5 MG PO CAPS   Oral   Take 12.5 mg by mouth 2 (two) times daily.         Marland Kitchen LEVETIRACETAM 100 MG/ML PO SOLN   Oral   Take 500 mg by mouth 2 (two) times daily.         Marland Kitchen LISINOPRIL 5 MG PO TABS   Oral   Take 5 mg by mouth 2 (two) times daily.          . OSMOLITE 1.5 CAL PO LIQD   Per Tube   Place 1,000 mLs into feeding tube continuous.         . TRAMADOL HCL 50 MG PO TABS   Oral   Take 50 mg by mouth 4 (four) times daily as needed. For pain         . TRAMADOL HCL 50 MG PO TABS   Oral   Take 1 tablet (50 mg total) by mouth every 6 (six) hours as needed for pain.   15 tablet   0   . TRAZODONE HCL 50 MG PO TABS   Oral   Take 50 mg by mouth at bedtime.           BP 97/67  Pulse 64  Temp 98.2 F (36.8 C)  Resp 12  SpO2 95%  Physical Exam  Nursing note and vitals reviewed. Constitutional: He is oriented to person, place, and time. He appears well-developed and well-nourished.  Non-toxic appearance. No distress.  HENT:  Head: Normocephalic and atraumatic.  Eyes: Conjunctivae normal, EOM and lids are normal. Pupils are equal, round, and reactive to light.  Neck: Normal range of motion. Neck supple. No tracheal deviation present. No mass present.  Cardiovascular: Normal rate, regular rhythm and normal heart sounds.  Exam reveals no gallop.   No murmur heard. Pulmonary/Chest: Effort normal and breath sounds normal. No stridor. No respiratory distress. He has no decreased breath sounds. He has no wheezes. He has no rhonchi. He has no rales.  Abdominal: Soft. Normal appearance and bowel sounds are normal. He exhibits no distension. There is no tenderness. There is no rebound and no CVA tenderness.  Musculoskeletal: Normal range of motion. He exhibits no edema and no tenderness.       Right shoulder: He exhibits tenderness and pain. He exhibits no bony tenderness and no swelling.       Arms: Neurological: He is alert and oriented to person, place, and time. He has normal strength. No cranial nerve deficit or sensory deficit. GCS eye subscore is 4. GCS verbal subscore is 5. GCS motor subscore is 6.  Skin: Skin is warm and dry. No abrasion and no rash noted.  Psychiatric: He  has a normal mood and affect. His speech is normal and behavior is normal.    ED Course  Procedures (including critical care time)  Labs Reviewed - No data to display No results found.   No diagnosis found.    MDM  Patient given Ativan for muscle spasms of his right shoulder. Right shoulder x-ray was negative. His chest x-ray did show questionable mild early edema. Blood work was performed demonstrating  doubling of his creatinine in the last 3 days to now 5.5 . He is in renal failure at this time and will be admitted to the hospital        Toy Baker, MD 04/04/12 [phone]

## 2012-04-04 NOTE — ED Notes (Signed)
Neurology PA at bedside

## 2012-04-04 NOTE — H&P (Signed)
[facility] History and Physical  Collin Diaz AVW:[id number] DOB: 1955-04-18 DOA: 04/04/2012  Referring physician: Dr Freida Busman PCP: Jackie Plum, MD  Specialists: Neurology: Dr. Thad Ranger Nephrology:  Chief Complaint: Right upper extremity weakness/numbness/pain  HPI: Collin Diaz is a 56 y.o. 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 The patient's wife patient also with incomprehensible slurred speech x1 day. Patient's wife does endorse some wheezing and decreased urine output. Patient's wife states patient has been under a lot of stress. Patient denies any fever, no chills, no chest pain, patient denies any shortness of breath however wife states has been breathing heavy somewhat. Patient denies any nausea no vomiting no abdominal pain no dysuria no cough no diarrhea. Patient with no bowel movement over the past 2 days. Patient was seen in the emergency room x-rays of the right shoulder and right upper extremity were negative for fracture. Compressive metabolic profile obtained had a BUN of 59 and a creatinine of 5.5 tube otherwise was within normal limits. First set of troponin was negative. CBC had a white count of 4.7 hemoglobin of 12.6 daily, 138 otherwise was within normal limits. Chest x-ray was  negative for infiltrate. Will call to admit the patient for further evaluation and management. During the interview patient with some incomprehensible speech and dysarthria.  Review of Systems: The patient denies anorexia, fever, weight loss,, vision loss, decreased hearing, hoarseness, chest pain, syncope, dyspnea on exertion, peripheral edema, balance deficits, hemoptysis, abdominal pain, melena, hematochezia, severe indigestion/heartburn, hematuria, incontinence, genital sores, muscle weakness, suspicious skin lesions, transient blindness, difficulty walking, depression, unusual weight change, abnormal bleeding, enlarged lymph nodes, angioedema, and breast masses.   Past Medical History  Diagnosis Date  . Hypertension   . Arthritis 04/04/2012  . Seizure 04/04/2012    POST CVA 12/2011  . RSD (reflex sympathetic dystrophy) 04/04/2012    LEFT HAND  . Dysphagia S/P CVA (cerebrovascular accident) 04/04/2012  . Aphasia 04/04/2012    HX OF POST cva  . Stroke 10/2010    LEFT mca cva ISCHEMIC  . 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 Chronic kidney disease   . Depression   . RSD (reflex sympathetic dystrophy) 2013    LEFT HAND   History reviewed. No pertinent past surgical history. Social History:  reports that he has never smoked. He does not have any smokeless tobacco history on file. He reports that he drinks alcohol. He reports that he does not use illicit drugs.  Allergies  Allergen Reactions  . Nsaids     Unknown FAMILY DENIES.    History reviewed. No pertinent family history.   Prior to Admission medications   Medication Sig Start Date End Date Taking? Authorizing Provider  citalopram (CELEXA) 10 MG tablet Take 10 mg by mouth daily.   Yes Historical Provider, MD  doxazosin (CARDURA) 8 MG tablet  Take 8 mg by mouth at bedtime.   Yes Historical Provider, MD  fluticasone (FLONASE) 50 MCG/ACT nasal spray Place 1 spray into the nose daily.   Yes Historical  Provider, MD  gabapentin (NEURONTIN) 100 MG capsule Take 1 capsule (100 mg total) by mouth 3 (three) times daily. 03/28/12  Yes Erick Colace, MD  hydrochlorothiazide (MICROZIDE) 12.5 MG capsule Take 12.5 mg by mouth 2 (two) times daily.   Yes Historical Provider, MD  ibuprofen (ADVIL,MOTRIN) 800 MG tablet Take 800 mg by mouth every 8 (eight) hours as needed. For pain   Yes Historical Provider, MD  levETIRAcetam (KEPPRA) 100 MG/ML solution Take 500 mg by mouth 2 (two) times daily.   Yes Historical Provider, MD  lisinopril (PRINIVIL,ZESTRIL) 5 MG tablet Take 5 mg by mouth 2 (two) times daily.    Yes Historical Provider, MD  traMADol (ULTRAM) 50 MG tablet Take 50 mg by mouth 4 (four) times daily as needed. For pain   Yes Historical Provider, MD  traZODone (DESYREL) 50 MG tablet Take 50 mg by mouth at bedtime.   Yes Historical Provider, MD  Nutritional Supplements (FEEDING SUPPLEMENT, OSMOLITE 1.5 CAL,) LIQD Place 1,000 mLs into feeding tube continuous.    Historical Provider, MD   Physical Exam: Filed Vitals:   04/04/12 0500 04/04/12 0600 04/04/12 0645 04/04/12 0848  BP: 108/87 109/77 99/67 130/93  Pulse: 65 82 66 87  Temp:    96.9 F (36.1 C)  TempSrc:    Rectal  Resp: 10 16 10    SpO2: 97% 97% 96% 100%     General:  Well-developed well-nourished laying in bed with a left-sided hemiplegia in no acute cardiopulmonary distress.  Eyes: Pupils equal round and reactive to light and accommodation. Extraocular movements intact.  ENT: Oropharynx with some food particles on the tongue. No lesions. No exudates.  Neck: Supple with no lymphadenopathy. No JVD.  Cardiovascular: Regular rate rhythm no murmurs rubs or gallops. No JVD. No lower extremity edema.  Respiratory: Lungs are clear to auscultation bilaterally in anterior lung fields. No wheezing, no crackles, no rhonchi.  Abdomen: Soft, nontender, nondistended, positive bowel sounds. PEG site scar seen.  Skin: Old PEG site with a scar  on the abdomen. No lesions or rashes.  Musculoskeletal: 0/5 left lower extremity strength. 0-1/5 left upper extremity strength. 5 out of 5 right lower extremity strength. 3-4/5 right upper extremity strength.  Psychiatric: Normal mood. Normal affect. Fair insight. Fair judgment.  Neurologic: Alert. Some dysarthria. Cranial nerves II through XII are grossly intact. Left hemiplegia. Sensation is intact. Some left visual field defect. Unable to elicit reflexes symmetrically in diffuse sleep. 3 - 4/5 right upper extremity strength. 5/5 right lower extremity strength. 0/5 left lower extremity strength. 0-1/5 left upper extremity strength.  Labs on Admission:  Basic Metabolic Panel:  Lab 04/04/12 1610 04/01/12 0930  NA 140 144  K 4.8 4.3  CL 104 106  CO2 22 24  GLUCOSE 96 111*  BUN 59* 27*  CREATININE 5.52* 2.27*  CALCIUM 9.3 10.0  MG -- --  PHOS -- --   Liver Function Tests:  Lab 04/04/12 0445  AST 22  ALT 46  ALKPHOS 142*  BILITOT 0.5  PROT 7.1  ALBUMIN 3.7   No results found for this basename: LIPASE:5,AMYLASE:5 in the last 168 hours No results found for this basename: AMMONIA:5 in the last 168 hours CBC:  Lab 04/04/12 0445 04/01/12 0930  WBC 4.7 3.7*  NEUTROABS 2.3 1.7  HGB 12.6*  13.7  HCT 39.2 41.0  MCV 90.3 89.3  PLT 138* 162   Cardiac Enzymes: No results found for this basename: CKTOTAL:5,CKMB:5,CKMBINDEX:5,TROPONINI:5 in the last 168 hours  BNP (last 3 results)  Basename 04/04/12 0633  PROBNP 116.6   CBG: No results found for this basename: GLUCAP:5 in the last 168 hours  Radiological Exams on Admission: Dg Shoulder Right  04/04/2012  *RADIOLOGY REPORT*  Clinical Data: Right shoulder pain  RIGHT SHOULDER - 2+ VIEW  Comparison: None.  Findings: Acromioclavicular degenerative change.  Glenohumeral joint remains located.  Right upper lung is clear.  No acute fracture.  IMPRESSION: No acute osseous abnormality of the right shoulder.  Acromioclavicular DJD.    Original Report Authenticated By: Jearld Lesch, M.D.    Ct Head Wo Contrast  04/04/2012  *RADIOLOGY REPORT*  Clinical Data: Slurred speech and right upper extremity weakness and numbness.  CT HEAD WITHOUT CONTRAST  Technique:  Contiguous axial images were obtained from the base of the skull through the vertex without contrast.  Comparison: 03/14/2012.  Findings: The ventricles are stable.  Asymmetric enlargement of the left lateral ventricle is again noted.  Advanced periventricular white matter disease for age and remote lacunar type infarcts.  No CT findings for acute hemispheric infarction and/or intracranial hemorrhage.  No extra-axial fluid collections.  The brainstem and cerebellum are grossly normal and stable.  No mass lesions.  The bony structures are intact.  The paranasal sinuses mastoid air cells are clear.  IMPRESSION:  1.  Significant chronic white matter changes for age. 2.  Stable remote lacunar type infarcts. 3.  No CT findings for acute hemispheric infarction or intracranial hemorrhage.   Original Report Authenticated By: Rudie Meyer, M.D.    Dg Chest Port 1 View  04/04/2012  *RADIOLOGY REPORT*  Clinical Data: Right shoulder pain  PORTABLE CHEST - 1 VIEW  Comparison: 12/30/2011  Findings: Prominent cardiomediastinal contours, similar to prior. Interstitial prominence.  Of the hemidiaphragms.  Interstitial and vascular crowding.  Multilevel degenerative changes.  No pneumothorax.  No definite pleural effusion.  No acute osseous finding.  IMPRESSION: Prominent cardiomediastinal contours, similar to prior.  Mild interstitial prominence may reflect chronic change or mild edema.   Original Report Authenticated By: Jearld Lesch, M.D.     EKG: None  Assessment/Plan Principal Problem:  *Slurred speech Active Problems:  GOUT NOS  Malignant hypertension  Thalamic hemorrhage  Obesity (BMI 30.0-34.9)  OSA (obstructive sleep apnea)  Chronic ischemic left MCA stroke  Depression   Acute-on-chronic renal failure  Arthritis  Seizure  RSD (reflex sympathetic dystrophy)  Dysphagia S/P CVA (cerebrovascular accident)  Aphasia  Right arm weakness  #1 slurred speech/dysarthria/right upper extremity weakness and numbness Questionable etiology. May be exacerbated by acute metabolic abnormality secondary to patient's acute on chronic renal insufficiency. Patient with no signs or symptoms of infection. Patient with recent stroke September of 2013. Will admit the patient to telemetry. Will check a CT of the head. Check MRI MRA of the head. Check carotid Dopplers. Check a 2-D echo. Place on aspirin. Speech therapy evaluation for swallowing. We'll place on aspirin 300 mg rectally for now. Blood pressure management with home regimen. Check a fasting lipid panel. PT/OT/speech therapy. Will consult with neurology for further evaluation and management. If MRI of the head is unremarkable may consider MRI of the right shoulder to rule out a rotator cuff tear.  #2 acute on chronic kidney disease Patient's baseline seems to be a creatinine of 2-2.5. Questionable etiology. Differential  includes prerenal versus renal versus post renal azotemia. Patient's creatinine has doubled in a matter of 2-3 days. Patient was on HCTZ and lisinopril and ibuprofen as needed. Will hold HCTZ and lisinopril and ibuprofen for now. Hydration with IV fluids. Check a urine sodium and a urine creatinine. Check a renal ultrasound. Check SPEP. Check a UPEP. Was a Foley catheter. Follow. Will consult with nephrology for further evaluation and management.  #3 hypertension Currently stable. Will hold patient's HCTZ and lisinopril for now secondary to problem #2. Continue Cardura. If further blood pressure control as needed may consider starting patient on Norvasc.  #4 depression Stable. Continue home regimen of Celexa.  #5 ?? seizures Stable. Continue home regimen of Keppra.  #6 gout Stable.  #7 dysphagia Patient was  on a pured diet with nectar thick liquids. We'll get a speech therapy evaluation. We'll keep n.p.o. for now until seen by speech therapy.  #8 obstructive sleep apnea Stable. May need CPAP each bedtime  #9 RSD of the left hand PT/ OT. Per renal.  #10 prophylaxis SCDs for DVT prophylaxis.  Code Status: Full Family Communication: Updated patient and wife at bedside Disposition Plan: Admit to telemetry.  Time spent: 65 mins  [facility] Pager [phone]  If 7PM-7AM, please contact night-coverage [email or web address] Password [facility] 04/04/2012, 9:27 AM

## 2012-04-04 NOTE — ED Notes (Addendum)
Per ems. Called out for R shoulder pain; slight tenderness with palpation. Pt with hx of cva in July with Left sided paralysis. ems bp 90 palpated. Pt is diaphoretic. Denies nvd. Pt affect is normal for him. cbg 126. 18G R ac. Pt denies injury to shoulder. 12 lead NSR 70's. Family states pt bp normally runs high and he was seen here Monday for htn however no change in medications.

## 2012-04-05 ENCOUNTER — Inpatient Hospital Stay (HOSPITAL_COMMUNITY): Payer: Medicaid Other

## 2012-04-05 DIAGNOSIS — N19 Unspecified kidney failure: Secondary | ICD-10-CM

## 2012-04-05 DIAGNOSIS — N185 Chronic kidney disease, stage 5: Secondary | ICD-10-CM

## 2012-04-05 DIAGNOSIS — Z8673 Personal history of transient ischemic attack (TIA), and cerebral infarction without residual deficits: Secondary | ICD-10-CM

## 2012-04-05 DIAGNOSIS — R4701 Aphasia: Secondary | ICD-10-CM

## 2012-04-05 DIAGNOSIS — F329 Major depressive disorder, single episode, unspecified: Secondary | ICD-10-CM

## 2012-04-05 DIAGNOSIS — I359 Nonrheumatic aortic valve disorder, unspecified: Secondary | ICD-10-CM

## 2012-04-05 LAB — LIPID PANEL
Cholesterol: 180 mg/dL (ref 0–200)
HDL: 39 mg/dL — ABNORMAL LOW (ref >39)
Total CHOL/HDL Ratio: 4.6 RATIO
Triglycerides: 221 mg/dL — ABNORMAL HIGH (ref <150)
VLDL: 44 mg/dL — ABNORMAL HIGH (ref 0–40)

## 2012-04-05 LAB — BASIC METABOLIC PANEL
BUN: 56 mg/dL — ABNORMAL HIGH (ref 6–23)
Calcium: 9.3 mg/dL (ref 8.4–10.5)
Creatinine, Ser: 3.89 mg/dL — ABNORMAL HIGH (ref 0.50–1.35)
GFR calc Af Amer: 19 mL/min — ABNORMAL LOW (ref >90)
GFR calc non Af Amer: 16 mL/min — ABNORMAL LOW (ref >90)
Glucose, Bld: 96 mg/dL (ref 70–99)

## 2012-04-05 LAB — CBC
Hemoglobin: 12.5 g/dL — ABNORMAL LOW (ref 13.0–17.0)
MCH: 29.6 pg (ref 26.0–34.0)
MCHC: 33.1 g/dL (ref 30.0–36.0)
RDW: 14.5 % (ref 11.5–15.5)

## 2012-04-05 LAB — HEMOGLOBIN A1C: Mean Plasma Glucose: 120 mg/dL — ABNORMAL HIGH (ref <117)

## 2012-04-05 LAB — URINE CULTURE
Colony Count: NO GROWTH
Culture: NO GROWTH

## 2012-04-05 MED ORDER — RESOURCE THICKENUP CLEAR PO POWD
ORAL | Status: DC | PRN
  Filled 2012-04-05: qty 125

## 2012-04-05 NOTE — Progress Notes (Signed)
Speech Language Pathology  Patient Details Name: Collin Diaz MRN: [id number] DOB: 09-16-1955 Today's Date: 04/05/2012 Time:  -    MBS performed.  Full report will be documented.   SLP RECOMMENDS: DYS2, HONEY THICK LIQUIDS  Breck Coons [place].Ed [employer] [phone]  04/05/2012

## 2012-04-05 NOTE — Procedures (Signed)
Objective Swallowing Evaluation: Modified Barium Swallowing Study  Patient Details  Name: SABIEN UMLAND MRN: [id number] Date of Birth: 02/20/1956  Today's Date: 04/05/2012 Time: 4098-1191 SLP Time Calculation (min): 15 min  Past Medical History:  Past Medical History  Diagnosis Date  . Hypertension   . Arthritis 04/04/2012  . Seizure 04/04/2012    POST CVA 12/2011  . RSD (reflex sympathetic dystrophy) 04/04/2012    LEFT HAND  . Dysphagia S/P CVA (cerebrovascular accident) 04/04/2012  . Aphasia 04/04/2012    HX OF POST cva  . Stroke 10/2010    LEFT mca cva ISCHEMIC  . 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 Chronic kidney disease   . Depression   . RSD (reflex sympathetic dystrophy) 2013    LEFT HAND   Past Surgical History:  Past Surgical History  Procedure Date  . No past surgeries    HPI:  The patient is a 56 y.o. 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 Patient had PEG for feedings and fluids until 03/25/12 (removed due to improved intake of solids, etc).  Was scheduled for an OPMBS yesterday at [facility]. He is admitted on the current occasion with slurred speech and weakness in the right upper extremity, with finding of new stroke on MRI ( Small acute lacunar infarcts in the right corona radiata and inferior lentiform nuclei.)       Assessment / Plan / Recommendation Clinical Impression  Dysphagia Diagnosis: Mild oral phase dysphagia;Moderate pharyngeal phase dysphagia Clinical impression: Pt. exhibited mild oral dysphagia with decreased mastication and transit.  Pharyngeal deficits are primarily sensory based with delayed swallow initiation to the pyriform sinuses.  Nectar thick  barium was silently aspirated before the swallow exacerbated by delayed swallow.  Chin tuck posture did not prevent or reduce aspiration with nectar consistency.  Barium pill stopped in his distal esophagus and was transited into stomach with additional bite of puree.  SLP recommends continue Dys 2 and honey thick liquids with continued ST follow up.       Treatment Recommendation  Therapy as outlined in treatment plan below    Diet Recommendation Dysphagia 2 (Fine chop);Honey-thick liquid   Liquid Administration via: Cup;No straw Medication Administration: Whole meds with puree Supervision: Full supervision/cueing for compensatory strategies;Patient able to self feed Compensations: Slow rate;Small sips/bites Postural Changes and/or Swallow Maneuvers: Seated upright 90 degrees    Other  Recommendations Oral Care Recommendations: Oral care BID   Follow Up Recommendations   (to be determined)    Frequency and Duration min 2x/week  2 weeks       SLP Swallow Goals Patient will utilize recommended strategies during swallow to increase swallowing safety with: Minimal cueing      Reason for Referral Objectively evaluate swallowing function   Oral Phase Oral Preparation/Oral Phase Oral Phase: Impaired Oral - Solids Oral - Regular: Weak lingual manipulation;Delayed oral transit   Pharyngeal Phase Pharyngeal Phase Pharyngeal Phase: Impaired Pharyngeal - Honey Pharyngeal - Honey Cup: Delayed swallow initiation;Premature spillage to pyriform sinuses;Pharyngeal residue - valleculae;Pharyngeal residue - pyriform sinuses Pharyngeal - Nectar Pharyngeal - Nectar Cup: Delayed swallow initiation;Premature spillage to pyriform sinuses;Penetration/Aspiration before swallow;Moderate aspiration Penetration/Aspiration details (nectar cup): Material enters airway,  passes BELOW cords without attempt by patient to eject out (silent aspiration) Pharyngeal - Solids Pharyngeal - Regular: Within functional  limits Pharyngeal - Pill: Delayed swallow initiation;Premature spillage to valleculae  Cervical Esophageal Phase       Darrow Bussing.Ed CCC-SLP Pager [phone]  04/05/2012  Cervical Esophageal Phase Cervical Esophageal Phase: [facility]

## 2012-04-05 NOTE — Progress Notes (Signed)
*  PRELIMINARY RESULTS* Vascular Ultrasound Carotid Duplex (Doppler) has been completed.  Preliminary findings: Bilateral:  No evidence of hemodynamically significant internal carotid artery stenosis.   Vertebral artery flow is antegrade.      Farrel Demark, RDMS, RVT 04/05/2012, 9:08 AM

## 2012-04-05 NOTE — Progress Notes (Signed)
  Echocardiogram 2D Echocardiogram has been performed.  Loraina Stauffer 04/05/2012, 9:07 AM

## 2012-04-05 NOTE — Progress Notes (Signed)
Subjective:  Foley in Clear urine draining Creatinine falling Evaluation showed probable new stroke (small lacunar infarcts)  Objective Vital signs in last 24 hours: Filed Vitals:   04/04/12 1953 04/04/12 2200 04/05/12 0200 04/05/12 0600  BP: 113/69 100/61 103/73 128/82  Pulse: 92 85 75 80  Temp: 97.9 F (36.6 C) 98.6 F (37 C) 98.4 F (36.9 C) 98.2 F (36.8 C)  TempSrc: Oral Oral Oral Oral  Resp: 18 18 18 18   Height:      Weight:      SpO2: 98% 98% 98% 98%   Weight change:   Intake/Output Summary (Last 24 hours) at 04/05/12 1307 Last data filed at 04/05/12 1100  Gross per 24 hour  Intake      0 ml  Output    900 ml  Net   -900 ml   Physical Exam:  Blood pressure 128/82, pulse 80, temperature 98.2 F (36.8 C), temperature source Oral, resp. rate 18, height 5\' 10"  (1.778 m), weight 89.812 kg (198 lb), SpO2 98.00%. Slurred speech but understandable Clear lungs No edema Left hemiplegia Foley clear urine  Labs: Basic Metabolic Panel:  Lab 04/05/12 1610 04/04/12 0445 04/01/12 0930  NA 143 140 144  K 4.8 4.8 4.3  CL 109 104 106  CO2 22 22 24   GLUCOSE 96 96 111*  BUN 56* 59* 27*  CREATININE 3.89* 5.52* 2.27*  ALB -- -- --  CALCIUM 9.3 9.3 10.0  PHOS -- -- --   Liver Function Tests:  Lab 04/04/12 0445  AST 22  ALT 46  ALKPHOS 142*  BILITOT 0.5  PROT 7.1  ALBUMIN 3.7   CBC:  Lab 04/05/12 0455 04/04/12 0445 04/01/12 0930  WBC 4.2 4.7 3.7*  NEUTROABS -- 2.3 1.7  HGB 12.5* 12.6* 13.7  HCT 37.8* 39.2 41.0  MCV 89.4 90.3 89.3  PLT 137* 138* 162   Studies/Results: Dg Chest 2 View  04/04/2012  *RADIOLOGY REPORT*  Clinical Data: Stroke  CHEST - 2 VIEW  Comparison: 04/04/2012  Findings: Cardiomediastinal silhouette is stable.  No acute infiltrate or pleural effusion.  No pulmonary edema.  Mild basilar atelectasis.  Degenerative changes thoracic spine.  IMPRESSION: No acute infiltrate or pulmonary edema.  Mild basilar atelectasis. Degenerative changes  thoracic spine   Original Report Authenticated By: Natasha Mead, M.D.    US Renal  04/04/2012  *RADIOLOGY REPORT*  Clinical Data: Acute on chronic renal failure  RENAL/URINARY TRACT ULTRASOUND COMPLETE  Comparison:  Prior renal ultrasound 12/06/2005  Findings:  Right Kidney:  The right kidney measures 8.8 cm in length which has decreased compared to 9.6 cm in 2007.  There is no focal cortical thinning.  The renal parenchyma is diffusely echogenic.  No focal solid lesion identified.  No hydronephrosis.  Left Kidney:  The left kidney measures 9 cm in length which is decreased from 10.6 cm in 2007.  No focal solid lesion or focal cortical thinning.  The renal parenchyma is diffusely echogenic.  Bladder:  Foley catheter identified within a collapsed bladder  IMPRESSION:  1.  No hydronephrosis 2.  Small echogenic kidneys bilaterally consistent with underlying medical renal disease.  The kidneys have slightly decreased in size compared to the renal ultrasound dated 12/06/2005.   Original Report Authenticated By: Malachy Moan, M.D.    Medications:    . sodium chloride 75 mL/hr at 04/05/12 1302      . aspirin  300 mg Rectal Daily   Or  . aspirin  325 mg Oral Daily  .  citalopram  10 mg Oral Daily  . doxazosin  8 mg Oral QHS  . fluticasone  1 spray Each Nare Daily  . gabapentin  100 mg Oral TID  . levETIRAcetam  500 mg Oral BID  . traZODone  50 mg Oral QHS    I  have reviewed scheduled and prn medications.  ASSESSMENT/RECOMMENDATIONS:   56 y.o. year-old AA male with a background history of multiple prior strokes (left MCA CVA 2012, right thalamic and basal ganglia hemorrhagic CVA with IVH 12/2011) , seizures post stroke, OSA, chronic dysphagia that required PEG (recently removed), RSD and hypertension, with baseline CKD (creatinine 2's), prior episode of AKI 12/2011 (volume depletion +/- urinary retention) Presented with  new stroke symptoms and noted to have AKI withmore than doubling of serum  creatinine in the 72 hours prior to admission, in the setting of ACE inhibitor, NSAID, relative hypotension and possible volume depletion. Also with symptoms of difficulty voiding and urinary retention PTA.   1. AKI on CKD   AKI likely multifactorial (ACE, NSAID, possible urinary retention) plus prob vol depl  Creatinine trending down with foley, IVF, d/c of ACE  Continue IVF for now  Will need to assure can adequately hydrate self before d/c IVF  May also need voiding trial as urinary retention by history PTA  2.. 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 History of prior strokes with left hemiplegia and UE spasticity, with evidence new CVA's - per primary  Will need to assure patient can properly hydrate self 4. HTN   ACE probably not best drug for this patient (suspect propensity to volume depletion with strokes/swallowing issues   Currently appropriate to hold ALL BP meds with normal BP's 5. Other issues per primary service  Camille Bal, MD [facility] [phone] pager 04/05/2012, 1:07 PM

## 2012-04-05 NOTE — Evaluation (Signed)
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 Unsure of patient's baseline to determine if further deficits from this CVA as pt has had many previous CVAs. Will continue to monitor and reassess in future sessions to determine need for additional acute PT    PT Assessment  Patient needs continued PT services    Follow Up Recommendations  Home health PT;Supervision/Assistance - 24 hour    Does the patient have the potential to tolerate intense rehabilitation      Barriers to Discharge        Equipment Recommendations  None recommended by PT    Recommendations for Other Services     Frequency Min 4X/week    Precautions / Restrictions Precautions Precautions: Fall Precaution Comments: L hemiplegia, pain due to spasticity.   Pertinent Vitals/Pain Pain in L side with all movement.     Mobility  Bed Mobility Bed Mobility: Supine to Sit;Sit to Supine Supine to Sit: 1: +2 Total assist;With rails;HOB flat Supine to Sit: Patient Percentage: 30% Sit to Supine: HOB flat;1: +2 Total assist Sit to Supine: Patient Percentage: 30% Transfers Transfers: Not assessed Ambulation/Gait Ambulation/Gait Assistance: Not tested (comment) Modified Rankin (Stroke Patients Only) Pre-Morbid Rankin Score: Severe disability Modified Rankin: Severe disability    Shoulder Instructions     Exercises     PT Diagnosis: Hemiplegia non-dominant side  PT Problem List: Decreased strength;Decreased activity tolerance;Decreased balance;Decreased mobility;Decreased knowledge of use of DME;Decreased knowledge of precautions;Decreased safety awareness;Pain PT Treatment Interventions: DME instruction;Functional mobility  training;Therapeutic activities;Therapeutic exercise;Balance training;Neuromuscular re-education;Patient/family education   PT Goals Acute Rehab PT Goals PT Goal Formulation: With patient Time For Goal Achievement: 04/19/12 Potential to Achieve Goals: Fair Pt will go Supine/Side to Sit: with mod assist PT Goal: Supine/Side to Sit - Progress: Goal set today Pt will Sit at Edge of Bed: with mod assist PT Goal: Sit at Edge Of Bed - Progress: Goal set today Pt will go Sit to Supine/Side: with mod assist PT Goal: Sit to Supine/Side - Progress: Goal set today Pt will Transfer Bed to Chair/Chair to Bed: with mod assist PT Transfer Goal: Bed to Chair/Chair to Bed - Progress: Goal set today  Visit Information  Last PT Received On: 04/05/12 Assistance Needed: +2 PT/OT Co-Evaluation/Treatment: Yes    Subjective Data      Prior Functioning  Home Living Lives With: Spouse Available Help at Discharge: Family;Available 24 hours/day Type of Home: Apartment Home Access: Stairs to enter Entrance Stairs-Number of Steps: 1 Entrance Stairs-Rails: None Home Layout: One level Bathroom Shower/Tub: Forensic scientist: Standard Bathroom Accessibility: Yes How Accessible: Accessible via wheelchair Home Adaptive Equipment: Wheelchair - manual;Walker - rolling;Hospital bed Prior Function Level of Independence: Needs assistance Needs Assistance: Bathing;Dressing;Grooming;Toileting;Transfers Bath: Total Dressing: Total Grooming: Total Toileting: Other (comment) (uses bedpan) Driving: No Comments: pt states his wife feeds him Communication Communication: Expressive difficulties Dominant Hand: Right    Cognition  Overall Cognitive Status: Impaired Area of Impairment: Other (comment) (questionable historian) Arousal/Alertness: Awake/alert Orientation Level: Situation;Disoriented to Behavior During Session: [facility] for tasks performed    Extremity/Trunk Assessment Right Upper  Extremity Assessment RUE ROM/Strength/Tone: [facility] for tasks assessed Left Upper Extremity Assessment LUE ROM/Strength/Tone: Deficits LUE ROM/Strength/Tone Deficits: baseline hemiplegia LUE Coordination: Deficits LUE Coordination Deficits: no functional  use Right Lower Extremity Assessment RLE ROM/Strength/Tone: [facility] for tasks assessed Left Lower Extremity Assessment LLE ROM/Strength/Tone: Deficits LLE ROM/Strength/Tone Deficits: spastic hemiplegia. Pain with all movements.   Balance Balance Balance Assessed: Yes Static Sitting Balance Static Sitting - Balance Support: Right upper extremity supported;Feet supported Static Sitting - Level of Assistance: 2: Max assist Static Sitting - Comment/# of Minutes: 5  End of Session PT - End of Session Activity Tolerance: Patient tolerated treatment well Patient left: in bed;with call bell/phone within reach Nurse Communication: Mobility status  GP     Milana Kidney 04/05/2012, 5:22 PM 04/05/2012 Milana Kidney DPT PAGER: [phone] OFFICE: [phone]

## 2012-04-05 NOTE — Progress Notes (Signed)
Stroke Team Progress Note  HISTORY  [name] is an 56 y.o. male with two previous CVA's in the past. On 10/21/2011 He suffered a ischemic stroke in the left MCA distribution while in [place], [place] and then right BG infarct on 01/03/2012 with hemorrhagic transformation and cytotoxic edema causing transfalcine herniation necessitating hypertonic saline. He was left with "left hemiparesis, expressive aphasia and sever dysarthria" per stroke team note. During that hospitalization he was sent to CIR and antiplatelets were held. Patietn was baseline (wheel chair and bed bound-per wife) yesterday until about 12 noon when she noted his speech was increasingly dysarthric, he seemed increasing lethargy and complaining of right arm pain (seen in ED on 04/01/12 for left arm pain). He was brought to ED for further evaluation. On admission his SBP ranged from 97-130, Afebrile.   Patient speaks in a whisper but tells me that his right arm and leg have been weaker than usual. He cannot give me an exact time of onset. He also complains of significant pain in his right shoulder and cringes when I minipulate his right shoulder. He states he does not have any decreased sensation in right arm or leg.  From notes, patient had a period of unresponsiveness with SBP in 70's while in CIR, question of siezure was brought up and patient was placed on Keppra. Negative EEG at that time.   Cr is 5.52 and last baseline was 2.2   LSN: 04/03/12  tPA Given: No: history of intracranial bleed and out of window.    SUBJECTIVE Patient is lying in bed. No complaints. Able to move right side. Denies any further pain.  OBJECTIVE Most recent Vital Signs: Filed Vitals:   04/04/12 1953 04/04/12 2200 04/05/12 0200 04/05/12 0600  BP: 113/69 100/61 103/73 128/82  Pulse: 92 85 75 80  Temp: 97.9 F (36.6 C) 98.6 F (37 C) 98.4 F (36.9 C) 98.2 F (36.8 C)  TempSrc: Oral Oral Oral Oral  Resp: 18 18 18 18   Height:       Weight:      SpO2: 98% 98% 98% 98%   CBG (last 3)  No results found for this basename: GLUCAP:3 in the last 72 hours  IV Fluid Intake:     . sodium chloride 1,000 mL (04/05/12 0051)    MEDICATIONS    . aspirin  300 mg Rectal Daily   Or  . aspirin  325 mg Oral Daily  . citalopram  10 mg Oral Daily  . doxazosin  8 mg Oral QHS  . fluticasone  1 spray Each Nare Daily  . gabapentin  100 mg Oral TID  . levETIRAcetam  500 mg Oral BID  . traZODone  50 mg Oral QHS   PRN:  acetaminophen, acetaminophen, ondansetron (ZOFRAN) IV, RESOURCE THICKENUP CLEAR, senna-docusate, traMADol  Diet:  Dysphagia 2 thin Activity:  Out of bed with assist DVT Prophylaxis:  SCD  CLINICALLY SIGNIFICANT STUDIES Basic Metabolic Panel:  Lab 04/05/12 4098 04/04/12 0445  NA 143 140  K 4.8 4.8  CL 109 104  CO2 22 22  GLUCOSE 96 96  BUN 56* 59*  CREATININE 3.89* 5.52*  CALCIUM 9.3 9.3  MG -- --  PHOS -- --   Liver Function Tests:  Lab 04/04/12 0445  AST 22  ALT 46  ALKPHOS 142*  BILITOT 0.5  PROT 7.1  ALBUMIN 3.7   CBC:  Lab 04/05/12 0455 04/04/12 0445 04/01/12 0930  WBC 4.2 4.7 --  NEUTROABS -- 2.3  1.7  HGB 12.5* 12.6* --  HCT 37.8* 39.2 --  MCV 89.4 90.3 --  PLT 137* 138* --   Urinalysis:  Lab 04/04/12 0447  COLORURINE YELLOW  LABSPEC 1.017  PHURINE 5.0  GLUCOSEU NEGATIVE  HGBUR NEGATIVE  BILIRUBINUR SMALL*  KETONESUR NEGATIVE  PROTEINUR NEGATIVE  UROBILINOGEN 1.0  NITRITE NEGATIVE  LEUKOCYTESUR SMALL*   Lipid Panel    Component Value Date/Time   CHOL 180 04/05/2012 0455   TRIG 221* 04/05/2012 0455   HDL 39* 04/05/2012 0455   CHOLHDL 4.6 04/05/2012 0455   VLDL 44* 04/05/2012 0455   LDLCALC 97 04/05/2012 0455   HgbA1C  Lab Results  Component Value Date   HGBA1C 6.3* 01/01/2012    Urine Drug Screen:   No results found for this basename: labopia, cocainscrnur, labbenz, amphetmu, thcu, labbarb    Alcohol Level: No results found for this basename: ETH:2 in the last  168 hours  Dg Chest 2 View 04/04/2012 No acute infiltrate or pulmonary edema.  Mild basilar atelectasis. Degenerative changes thoracic spine       Dg Shoulder Right 04/04/2012 No acute osseous abnormality of the right shoulder.  Acromioclavicular DJD  Ct Head Wo Contrast 04/04/2012    1.  Significant chronic white matter changes for age. 2.  Stable remote lacunar type infarcts. 3.  No CT findings for acute hemispheric infarction or intracranial hemorrhage.      MRI HEAD WITHOUT CONTRAST  1.  Small acute lacunar infarcts in the right corona radiata and inferior lentiform nuclei.  No mass effect or acute hemorrhage. 2.  Underlying very severe chronic small vessel disease with multiple chronic deep gray matter hemorrhages.    MRA HEAD WITHOUT CONTRAST   1.  Distal left vertebral artery high-grade stenosis and/or occlusion.  Favor chronic given absence of associated acute MRI findings in the posterior fossa. 2.  Mild distal right vertebral artery atherosclerosis without significant stenosis. 3.  Otherwise negative intracranial MRA.  US Renal 04/04/2012  1.  No hydronephrosis 2.  Small echogenic kidneys bilaterally consistent with underlying medical renal disease.   Dg Chest Port 1 View 04/04/2012  Prominent cardiomediastinal contours, similar to prior.  Mild interstitial prominence may reflect chronic change or mild edema.     2D Echocardiogram  No significant carotid artery stenosis  Carotid Doppler no significant ICA stenosis   EKG  normal sinus rhythm.   Therapy Recommendations   Physical Exam   Elderly African American male not in distress.Awake alert. Afebrile. Head is nontraumatic. Neck is supple without bruit. Hearing is normal. Cardiac exam no murmur or gallop. Lungs are clear to auscultation. Distal pulses are well felt.  Neurological Exam ; a wake and blood oriented x3. Mild dysarthria. No aphasia. Extraocular moments are full range. Diminished blink on the left compared to the  right. Moderate left lower facial weakness. Tongue is midline. Jaw jerk is brisk. Cough and gag are exaggerated. Spastic dense left hemiplegia with 0/5 strength with non-fixed flexion contractures of the left wrist and fingers. Tone is increased significantly on the left. Good strength on the right side without significant focal weakness. Deep tendon reflexes are exaggerated on the left and normal on the right. Left plantar is upgoing right is equivocal. Sensation is diminished on the left hemibody. Gait was not tested.    ASSESSMENT [name] is a 56 y.o. male presenting with dyarthria, lethargy, right hemiparesis. Small acute lacunar infarcts in the right corona radiata and inferior lentiform nuclei. Distal left  vertebral artery high grade stenosis. Infarct felt to be thromboembolic,work up underway. On  no anticoagulants prior to admission due to right basal ganglia infarct on 01/03/2012 with hemorrhage transformation (had R hemiparesis requiring CIR stay). Now on aspirin 325 mg orally every day for secondary stroke prevention. Patient with resultant right hemiparesis, dyarthria.    Left hemiparesis  Dysarthria  Right arm pain on admit, now resolved by current exam. Full ROM  Lacunar infarcts right corona radiata and inferior lentiform nuclei  Hypertension  Stroke  Seizure  Reflex sympathetic dystrophy   Hospital day # 1  TREATMENT/PLAN  Continue aspirin 325 mg orally every day for secondary stroke prevention.  Risk factor modification  Therapy evaluation and participation Stroke service will sign off. Have patient keep stroke appointment or if none within 2 months have him make appt to see Dr. Pearlean Brownie in 2 months.  Gwendolyn Lima. Manson Passey, [facility], MBA, MHA [facility] Pager: [phone] 04/05/2012 8:46 AM  I have personally obtained a history, examined the patient, evaluated imaging results, and formulated the assessment and plan of care. I agree with the  above.  Delia Heady, MD Medical Director [facility] Pager: [phone] 04/05/2012 6:02 PM

## 2012-04-05 NOTE — Progress Notes (Signed)
Foley output 1100cc draining amber color urine

## 2012-04-05 NOTE — Evaluation (Signed)
Occupational Therapy Evaluation Patient Details Name: Collin Diaz MRN: [id number] DOB: [birth year] Today's Date: 04/05/2012 Time: 1191-4782 OT Time Calculation (min): 26 min  OT Assessment / Plan / Recommendation Clinical Impression  Pt admitted with slurred speech in the setting of dense L hemiplegia from prior CVAs.  Pt is dependent in ADL and mobility.  No further acute OT needs.    OT Assessment  Patient does not need any further OT services    Follow Up Recommendations  Home health OT;Other (comment);Supervision/Assistance - 24 hour (resume HHOT)    Barriers to Discharge      Equipment Recommendations  None recommended by OT    Recommendations for Other Services    Frequency       Precautions / Restrictions Precautions Precautions: Fall Precaution Comments: L hemiplegia, pain due to spasticity.   Pertinent Vitals/Pain R UE/LE with ROM, pt with increased tone due to old CVA, repositioned and encouraged self ROM.    ADL  Eating/Feeding: Performed;Set up Where Assessed - Eating/Feeding: Bed level Grooming: Wash/dry face;Set up Where Assessed - Grooming: Supine, head of bed up Upper Body Bathing: +1 Total assistance Where Assessed - Upper Body Bathing: Supine, head of bed up Lower Body Bathing: Performed;+1 Total assistance Where Assessed - Lower Body Bathing: Rolling right and/or left;Supine, head of bed up Upper Body Dressing: +1 Total assistance Where Assessed - Upper Body Dressing: Supine, head of bed up Lower Body Dressing: +1 Total assistance Where Assessed - Lower Body Dressing: Supine, head of bed up ADL Comments: Pt is dependent in ADL at baseline, has a personal care attendant and wife to assist.  Reports he is receiving HHOT and PT.  Chart states pt has a hand splint, but pt denies.  Pt reports he performs daily self ROM to L UE.  R UE pain has resolved.    OT Diagnosis:    OT Problem List:   OT Treatment Interventions:     OT Goals    Visit  Information  Last OT Received On: 04/05/12 Assistance Needed: +2 PT/OT Co-Evaluation/Treatment: Yes    Subjective Data  Subjective: "I stay in the bed mostly." Patient Stated Goal: Go home.   Prior Functioning     Home Living Lives With: Spouse Available Help at Discharge: Family;Available 24 hours/day Type of Home: Apartment Home Access: Stairs to enter Entrance Stairs-Number of Steps: 1 Entrance Stairs-Rails: None Home Layout: One level Bathroom Shower/Tub: Forensic scientist: Standard Bathroom Accessibility: Yes How Accessible: Accessible via wheelchair Home Adaptive Equipment: Wheelchair - manual;Walker - rolling;Hospital bed Prior Function Level of Independence: Needs assistance Needs Assistance: Bathing;Dressing;Grooming;Toileting;Transfers Bath: Total Dressing: Total Grooming: Total Toileting: Other (comment) (uses bedpan) Driving: No Comments: pt states his wife feeds him Communication Communication: Expressive difficulties Dominant Hand: Right         Vision/Perception     Cognition  Overall Cognitive Status: Impaired Area of Impairment: Other (comment) (questionable historian) Arousal/Alertness: Awake/alert Orientation Level: Situation;Disoriented to Behavior During Session: [facility] for tasks performed    Extremity/Trunk Assessment Right Upper Extremity Assessment RUE ROM/Strength/Tone: [facility] for tasks assessed Left Upper Extremity Assessment LUE ROM/Strength/Tone: Deficits LUE ROM/Strength/Tone Deficits: baseline hemiplegia LUE Coordination: Deficits LUE Coordination Deficits: no functional use Right Lower Extremity Assessment RLE ROM/Strength/Tone: WFL for tasks assessed     Mobility Bed Mobility Bed Mobility: Supine to Sit;Sit to Supine Supine to Sit: 1: +2 Total assist;With rails;HOB flat Supine to Sit: Patient Percentage: 30% Sit to Supine: HOB flat;1: +2 Total assist Sit to Supine:  Patient Percentage:  30% Transfers Transfers: Not assessed     Shoulder Instructions     Exercise     Balance Balance Balance Assessed: Yes Static Sitting Balance Static Sitting - Balance Support: Right upper extremity supported;Feet supported Static Sitting - Level of Assistance: 2: Max assist Static Sitting - Comment/# of Minutes: 5   End of Session OT - End of Session Activity Tolerance: Patient limited by pain Patient left: in bed;with call bell/phone within reach;with bed alarm set Nurse Communication: Mobility status  GO     Evern Bio 04/05/2012, 4:35 PM [phone]

## 2012-04-05 NOTE — Progress Notes (Signed)
Patient ID: Collin Diaz  male  ZOX:[id number]    DOB: [birth year]    DOA: 04/04/2012  PCP: Jackie Plum, MD  Assessment/Plan: Principal Problem:  *Slurred speech/dysarthria/left sided hemiplegia/ new right upper extremity ? Weakness/numbness - MRI of the brain was obtained which showed small acute lacunar infarct in the right corona radiata and inferior lentiform nuclei, severe chronic small vessel changes - MRA showed distal left vertebral artery high-grade stenosis or occlusion - On aspirin 325 mg daily, neurology following - Carotid Doppler shows no evidence of hemodynamically significant ICA stenosis - 2-D echo shows EF of 55-60% - Speech therapy Evaluated today and started on dysphagia 3 diet with honey thick liquids   Active Problems: Right on pain with decreased range of movement: - MRI of the right shoulder ordered to rule out any rotator cuff tear  Hypertension: Currently stable, continue to hold off on antihypertensive esp with new CVA, ARF  Acute on chronic renal failure: CK D. stage III- IV, baseline creatinine of 2-2.5, creatinine on admission 5.52., Multifactorial, ACEI/NSAIDS/urinary retention/dehydration - Appreciate renal consult and recommendations, continue hydration, creatinine trending down, 3.89 today  Dysphagia: Started on dysphagia 3 diet with honey thick liquids, was on pured diet prior to admission  Seizures: Continue Keppra  Generalized deconditioning: Add PT OT eval  DVT Prophylaxis: SCD's  Code Status:  Disposition:    Subjective: No change in the left sided hemiplegia, right-sided weakness improved. He does have pain in the right shoulder and decreased range of motion due to pain  Objective: Weight change:   Intake/Output Summary (Last 24 hours) at 04/05/12 1227 Last data filed at 04/05/12 1100  Gross per 24 hour  Intake      0 ml  Output    900 ml  Net   -900 ml   Blood pressure 128/82, pulse 80, temperature 98.2 F (36.8 C),  temperature source Oral, resp. rate 18, height 5\' 10"  (1.778 m), weight 89.812 kg (198 lb), SpO2 98.00%.  Physical Exam: General: Alert and awake, oriented x3, not in any acute distress. HEENT: anicteric sclera, pupils reactive to light and accommodation, EOMI CVS: S1-S2 clear, no murmur rubs or gallops Chest: clear to auscultation bilaterally, no wheezing, rales or rhonchi Abdomen: soft nontender, nondistended, normal bowel sounds, no organomegaly Extremities: no cyanosis, clubbing or edema noted bilaterally Neuro: Left-sided hemiplegia(from previous stroke), some dysarthria, 4/5 right side  Lab Results: Basic Metabolic Panel:  Lab 04/05/12 8119 04/04/12 0445  NA 143 140  K 4.8 4.8  CL 109 104  CO2 22 22  GLUCOSE 96 96  BUN 56* 59*  CREATININE 3.89* 5.52*  CALCIUM 9.3 9.3  MG -- --  PHOS -- --   Liver Function Tests:  Lab 04/04/12 0445  AST 22  ALT 46  ALKPHOS 142*  BILITOT 0.5  PROT 7.1  ALBUMIN 3.7   CBC:  Lab 04/05/12 0455 04/04/12 0445  WBC 4.2 4.7  NEUTROABS -- 2.3  HGB 12.5* 12.6*  HCT 37.8* 39.2  MCV 89.4 90.3  PLT 137* 138*     Micro Results: Recent Results (from the past 240 hour(s))  URINE CULTURE     Status: Normal   Collection Time   04/04/12  4:47 AM      Component Value Range Status Comment   Specimen Description URINE, RANDOM   Final    Special Requests NONE   Final    Culture  Setup Time 04/04/2012 08:34   Final    Colony Count NO GROWTH  Final    Culture NO GROWTH   Final    Report Status 04/05/2012 FINAL   Final     Studies/Results: Dg Chest 2 View  04/04/2012  *RADIOLOGY REPORT*  Clinical Data: Stroke  CHEST - 2 VIEW  Comparison: 04/04/2012  Findings: Cardiomediastinal silhouette is stable.  No acute infiltrate or pleural effusion.  No pulmonary edema.  Mild basilar atelectasis.  Degenerative changes thoracic spine.  IMPRESSION: No acute infiltrate or pulmonary edema.  Mild basilar atelectasis. Degenerative changes thoracic spine    Original Report Authenticated By: Natasha Mead, M.D.    Dg Shoulder Right  04/04/2012  *RADIOLOGY REPORT*  Clinical Data: Right shoulder pain  RIGHT SHOULDER - 2+ VIEW  Comparison: None.  Findings: Acromioclavicular degenerative change.  Glenohumeral joint remains located.  Right upper lung is clear.  No acute fracture.  IMPRESSION: No acute osseous abnormality of the right shoulder.  Acromioclavicular DJD.   Original Report Authenticated By: Jearld Lesch, M.D.    Dg Elbow Complete Left  04/01/2012  *RADIOLOGY REPORT*  Clinical Data: Left arm pain.  LEFT ELBOW - COMPLETE 3+ VIEW  Comparison: No priors.  Findings: Four views of the left elbow demonstrate no acute displaced fracture, subluxation, dislocation, joint or soft tissue abnormality.  IMPRESSION: 1.  No acute radiographic abnormality of the left elbow.   Original Report Authenticated By: Trudie Reed, M.D.    Ct Head Wo Contrast  04/04/2012  *RADIOLOGY REPORT*  Clinical Data: Slurred speech and right upper extremity weakness and numbness.  CT HEAD WITHOUT CONTRAST  Technique:  Contiguous axial images were obtained from the base of the skull through the vertex without contrast.  Comparison: 03/14/2012.  Findings: The ventricles are stable.  Asymmetric enlargement of the left lateral ventricle is again noted.  Advanced periventricular white matter disease for age and remote lacunar type infarcts.  No CT findings for acute hemispheric infarction and/or intracranial hemorrhage.  No extra-axial fluid collections.  The brainstem and cerebellum are grossly normal and stable.  No mass lesions.  The bony structures are intact.  The paranasal sinuses mastoid air cells are clear.  IMPRESSION:  1.  Significant chronic white matter changes for age. 2.  Stable remote lacunar type infarcts. 3.  No CT findings for acute hemispheric infarction or intracranial hemorrhage.   Original Report Authenticated By: Rudie Meyer, M.D.    Ct Head Wo  Contrast  03/14/2012  *RADIOLOGY REPORT*  Clinical Data: History of thalamic bleeding, new onset headache  CT HEAD WITHOUT CONTRAST  Technique:  Contiguous axial images were obtained from the base of the skull through the vertex without contrast.  Comparison: 01/08/2012; 01/02/2012; 12/31/2011; 12/30/2011  Findings:  There has been interval resolution of hyperattenuating blood at the site of previously noted right thalamic/basal ganglial hemorrhage. No new intraparenchymal or extra-axial hemorrhage.  Redemonstrated extensive periventricular hypodensities compatible with microvascular ischemic disease. Unchanged old/remote lacunar infarct within the left basal ganglia/external capsule.  Given extensive background parenchymal abnormalities, there is no CT evidence of acute large territory infarct.  No intraparenchymal or extra-axial mass.  Unchanged size configuration of the ventricles and basilar cisterns with mild asymmetric dilatation of the left lateral ventricle.  No midline shift.  Paranasal sinuses and mastoid air cells are normal. Regional soft tissues are normal.  No displaced calvarial fracture.  IMPRESSION: 1.  Interval resolution of previously noted right basal ganglial/thalamic hemorrhage without acute intracranial process. 2.  Advanced microvascular ischemic disease with unchanged remote, remote lacunar infarct within the left basal  ganglia/external capsule.   Original Report Authenticated By: Tacey Ruiz, MD    Mr Brain Wo Contrast  04/04/2012  *RADIOLOGY REPORT*  Clinical Data:  56 year old male who is unresponsive.  Slurred speech and right side weakness.  Comparison: Head CTs 04/04/2012 and earlier.  MRI HEAD WITHOUT CONTRAST  Technique: Multiplanar, multiecho pulse sequences of the brain and surrounding structures were obtained according to standard protocol without intravenous contrast.  Findings: Multifocal chronic bilateral deep gray matter nuclei hemorrhages and lacunar infarcts, see  series 9.  Associated deep gray matter encephalomalacia and ex vacuo enlargement of the left lateral ventricle.  Additional confluent diffuse white matter T2 and FLAIR hyperintensity.  Similar confluent T2 signal in the pons.  There are small areas of restricted diffusion at the inferior right lentiform nuclei (series 5 image 14) and along the right periventricular white matter (image 18).  Major intracranial vascular flow voids are preserved.  Negative pituitary, cervicomedullary junction and visualized cervical spine. Normal bone marrow signal.  Hyperostosis of the calvarium. Scattered additional chronic micro hemorrhages in the brain other than those described above. No acute intracranial hemorrhage identified.  Visualized orbit soft tissues are within normal limits.  Minor maxillary sinus mucosal thickening.  Other Visualized paranasal sinuses and mastoids are clear.  Small volume retained secretions in the pharynx.  Negative scalp soft tissues.  IMPRESSION: 1.  Small acute lacunar infarcts in the right corona radiata and inferior lentiform nuclei.  No mass effect or acute hemorrhage. 2.  Underlying very severe chronic small vessel disease with multiple chronic deep gray matter hemorrhages. 3.  MRA findings are below.  MRA HEAD WITHOUT CONTRAST  Technique: Angiographic images of the Circle of Willis were obtained using MRA technique without  intravenous contrast.  Findings: Antegrade flow in the distal right vertebral artery without stenosis.  Poorer/intermittent antegrade flow in the distal left vertebral artery.  Little to no flow in the left V4 segment. The right PICA origin is patent.  The left PICA is not identified. The left AICA origin is patent and that vessel may be dominant.  Basilar artery irregularity without significant stenosis.  SCA and left PCA origins normal.  Fetal type right PCA.  Bilateral PCA branches are within normal limits.  Antegrade flow in both ICA siphons.  Right ICA mildly dominant  probably owing to the fetal right PCA type anatomy.  No ICA stenosis.  Ophthalmic artery origins and right posterior communicating artery origin are within normal limits.  Normal carotid termini.  Normal MCA and ACA origins.  Diminutive or absent anterior communicating artery.  Visualized ACA branches are within normal limits.  Visualized bilateral MCA branches are within normal limits.  IMPRESSION: 1.  Distal left vertebral artery high-grade stenosis and/or occlusion.  Favor chronic given absence of associated acute MRI findings in the posterior fossa. 2.  Mild distal right vertebral artery atherosclerosis without significant stenosis. 3.  Otherwise negative intracranial MRA.   Original Report Authenticated By: Erskine Speed, M.D.    US Renal  04/04/2012  *RADIOLOGY REPORT*  Clinical Data: Acute on chronic renal failure  RENAL/URINARY TRACT ULTRASOUND COMPLETE  Comparison:  Prior renal ultrasound 12/06/2005  Findings:  Right Kidney:  The right kidney measures 8.8 cm in length which has decreased compared to 9.6 cm in 2007.  There is no focal cortical thinning.  The renal parenchyma is diffusely echogenic.  No focal solid lesion identified.  No hydronephrosis.  Left Kidney:  The left kidney measures 9 cm in length which  is decreased from 10.6 cm in 2007.  No focal solid lesion or focal cortical thinning.  The renal parenchyma is diffusely echogenic.  Bladder:  Foley catheter identified within a collapsed bladder  IMPRESSION:  1.  No hydronephrosis 2.  Small echogenic kidneys bilaterally consistent with underlying medical renal disease.  The kidneys have slightly decreased in size compared to the renal ultrasound dated 12/06/2005.   Original Report Authenticated By: Malachy Moan, M.D.    Dg Chest Port 1 View  04/04/2012  *RADIOLOGY REPORT*  Clinical Data: Right shoulder pain  PORTABLE CHEST - 1 VIEW  Comparison: 12/30/2011  Findings: Prominent cardiomediastinal contours, similar to prior. Interstitial  prominence.  Of the hemidiaphragms.  Interstitial and vascular crowding.  Multilevel degenerative changes.  No pneumothorax.  No definite pleural effusion.  No acute osseous finding.  IMPRESSION: Prominent cardiomediastinal contours, similar to prior.  Mild interstitial prominence may reflect chronic change or mild edema.   Original Report Authenticated By: Jearld Lesch, M.D.    Dg Shoulder Left  04/01/2012  *RADIOLOGY REPORT*  Clinical Data: Arm pain.  LEFT SHOULDER - 2+ VIEW  Comparison: No priors.  Findings: Three views of the left shoulder demonstrate no acute displaced fracture, subluxation, dislocation, joint or soft tissue abdomen.  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  IMPRESSION: 1.  No acute radiographic abnormality of the left shoulder.   Original Report Authenticated By: Trudie Reed, M.D.    Mr Mra Head/brain Wo Cm  04/04/2012  *RADIOLOGY REPORT*  Clinical Data:  56 year old male who is unresponsive.  Slurred speech and right side weakness.  Comparison: Head CTs 04/04/2012 and earlier.  MRI HEAD WITHOUT CONTRAST  Technique: Multiplanar, multiecho pulse sequences of the brain and surrounding structures were obtained according to standard protocol without intravenous contrast.  Findings: Multifocal chronic bilateral deep gray matter nuclei hemorrhages and lacunar infarcts, see series 9.  Associated deep gray matter encephalomalacia and ex vacuo enlargement of the left lateral ventricle.  Additional confluent diffuse white matter T2 and FLAIR hyperintensity.  Similar confluent T2 signal in the pons.  There are small areas of restricted diffusion at the inferior right lentiform nuclei (series 5 image 14) and along the right periventricular white matter (image 18).  Major intracranial vascular flow voids are preserved.  Negative pituitary, cervicomedullary junction and visualized cervical spine. Normal bone marrow signal.  Hyperostosis of the calvarium.  Scattered additional chronic micro hemorrhages in the brain other than those described above. No acute intracranial hemorrhage identified.  Visualized orbit soft tissues are within normal limits.  Minor maxillary sinus mucosal thickening.  Other Visualized paranasal sinuses and mastoids are clear.  Small volume retained secretions in the pharynx.  Negative scalp soft tissues.  IMPRESSION: 1.  Small acute lacunar infarcts in the right corona radiata and inferior lentiform nuclei.  No mass effect or acute hemorrhage. 2.  Underlying very severe chronic small vessel disease with multiple chronic deep gray matter hemorrhages. 3.  MRA findings are below.  MRA HEAD WITHOUT CONTRAST  Technique: Angiographic images of the Circle of Willis were obtained using MRA technique without  intravenous contrast.  Findings: Antegrade flow in the distal right vertebral artery without stenosis.  Poorer/intermittent antegrade flow in the distal left vertebral artery.  Little to no flow in the left V4 segment. The right PICA origin is patent.  The left PICA is not identified. The left AICA origin is patent and that vessel may be dominant.  Basilar artery irregularity without  significant stenosis.  SCA and left PCA origins normal.  Fetal type right PCA.  Bilateral PCA branches are within normal limits.  Antegrade flow in both ICA siphons.  Right ICA mildly dominant probably owing to the fetal right PCA type anatomy.  No ICA stenosis.  Ophthalmic artery origins and right posterior communicating artery origin are within normal limits.  Normal carotid termini.  Normal MCA and ACA origins.  Diminutive or absent anterior communicating artery.  Visualized ACA branches are within normal limits.  Visualized bilateral MCA branches are within normal limits.  IMPRESSION: 1.  Distal left vertebral artery high-grade stenosis and/or occlusion.  Favor chronic given absence of associated acute MRI findings in the posterior fossa. 2.  Mild distal right  vertebral artery atherosclerosis without significant stenosis. 3.  Otherwise negative intracranial MRA.   Original Report Authenticated By: Erskine Speed, M.D.     Medications: Scheduled Meds:   . aspirin  300 mg Rectal Daily   Or  . aspirin  325 mg Oral Daily  . citalopram  10 mg Oral Daily  . doxazosin  8 mg Oral QHS  . fluticasone  1 spray Each Nare Daily  . gabapentin  100 mg Oral TID  . levETIRAcetam  500 mg Oral BID  . traZODone  50 mg Oral QHS      LOS: 1 day   Romari Gasparro M.D. [facility] 04/05/2012, 12:27 PM Pager: [phone]  If 7PM-7AM, please contact night-coverage [email or web address] Password [email or web address]

## 2012-04-05 NOTE — Progress Notes (Signed)
Wife called requesting no incoming calls directly to pt's room and to please unplug phone. Per wife, patient and wife are originally from [facility] and every time someone from [facility] calls pt he gets upset; pt "needs time to heal." Wife will call nurses station and ask to speak to pt. [place], Rosanna Randy

## 2012-04-06 DIAGNOSIS — I6350 Cerebral infarction due to unspecified occlusion or stenosis of unspecified cerebral artery: Secondary | ICD-10-CM

## 2012-04-06 DIAGNOSIS — I639 Cerebral infarction, unspecified: Secondary | ICD-10-CM

## 2012-04-06 LAB — BASIC METABOLIC PANEL
BUN: 39 mg/dL — ABNORMAL HIGH (ref 6–23)
Chloride: 110 mEq/L (ref 96–112)
Creatinine, Ser: 2.61 mg/dL — ABNORMAL HIGH (ref 0.50–1.35)
GFR calc Af Amer: 30 mL/min — ABNORMAL LOW (ref >90)
GFR calc non Af Amer: 26 mL/min — ABNORMAL LOW (ref >90)
Potassium: 4.6 mEq/L (ref 3.5–5.1)

## 2012-04-06 NOTE — Progress Notes (Signed)
Subjective:  Wants to go home Denies SOB or pain  Objective Vital signs in last 24 hours: Filed Vitals:   04/05/12 2200 04/06/12 0200 04/06/12 0600 04/06/12 0955  BP: 164/104 132/86 143/87 146/109  Pulse: 78 79 87 84  Temp: 97.6 F (36.4 C) 98.1 F (36.7 C) 98.2 F (36.8 C) 97.5 F (36.4 C)  TempSrc: Oral Oral Oral Oral  Resp: 18 18 18 18   Height:      Weight:      SpO2: 99% 98% 98% 100%   Weight change:   Intake/Output Summary (Last 24 hours) at 04/06/12 1205 Last data filed at 04/06/12 0850  Gross per 24 hour  Intake    120 ml  Output   1200 ml  Net  -1080 ml   Physical Exam:  Blood pressure 146/109, pulse 84, temperature 97.5 F (36.4 C), temperature source Oral, resp. rate 18, height 5\' 10"  (1.778 m), weight 89.812 kg (198 lb), SpO2 100.00%. Awake and alert Wants to go home Lungs remain clear No edema of lower extremities Left hemiplegia Clear yellow urine in foley  Labs: Basic Metabolic Panel:  Lab 04/06/12 4098 04/05/12 0455 04/04/12 0445 04/01/12 0930  NA 145 143 140 144  K 4.6 4.8 4.8 4.3  CL 110 109 104 106  CO2 20 22 22 24   GLUCOSE 92 96 96 111*  BUN 39* 56* 59* 27*  CREATININE 2.61* 3.89* 5.52* 2.27*  ALB -- -- -- --  CALCIUM 9.4 9.3 9.3 10.0  PHOS -- -- -- --   Liver Function Tests:  Lab 04/04/12 0445  AST 22  ALT 46  ALKPHOS 142*  BILITOT 0.5  PROT 7.1  ALBUMIN 3.7   CBC:  Lab 04/05/12 0455 04/04/12 0445 04/01/12 0930  WBC 4.2 4.7 3.7*  NEUTROABS -- 2.3 1.7  HGB 12.5* 12.6* 13.7  HCT 37.8* 39.2 41.0  MCV 89.4 90.3 89.3  PLT 137* 138* 162    Studies/Results: Dg Chest 2 View  04/04/2012  *RADIOLOGY REPORT*  Clinical Data: Stroke  CHEST - 2 VIEW  Comparison: 04/04/2012  Findings: Cardiomediastinal silhouette is stable.  No acute infiltrate or pleural effusion.  No pulmonary edema.  Mild basilar atelectasis.  Degenerative changes thoracic spine.  IMPRESSION: No acute infiltrate or pulmonary edema.  Mild basilar atelectasis.  Degenerative changes thoracic spine   Original Report Authenticated By: Natasha Mead, M.D.    US Renal  04/04/2012  *RADIOLOGY REPORT*  Clinical Data: Acute on chronic renal failure  RENAL/URINARY TRACT ULTRASOUND COMPLETE  Comparison:  Prior renal ultrasound 12/06/2005  Findings:  Right Kidney:  The right kidney measures 8.8 cm in length which has decreased compared to 9.6 cm in 2007.  There is no focal cortical thinning.  The renal parenchyma is diffusely echogenic.  No focal solid lesion identified.  No hydronephrosis.  Left Kidney:  The left kidney measures 9 cm in length which is decreased from 10.6 cm in 2007.  No focal solid lesion or focal cortical thinning.  The renal parenchyma is diffusely echogenic.  Bladder:  Foley catheter identified within a collapsed bladder  IMPRESSION:  1.  No hydronephrosis 2.  Small echogenic kidneys bilaterally consistent with underlying medical renal disease.  The kidneys have slightly decreased in size compared to the renal ultrasound dated 12/06/2005.   Original Report Authenticated By: Malachy Moan, M.D.    Mr Shoulder Right Wo Contrast  04/06/2012  *RADIOLOGY REPORT*  Clinical Data:  New onset right upper extremity weakness in patient with left  hemiplegia.  MRI OF THE RIGHT SHOULDER WITHOUT CONTRAST  Technique:  Multiplanar, multisequence MR imaging of the right shoulder was performed.  No intravenous contrast was administered.  Comparison:  Plain films right shoulder 04/04/2012.  FINDINGS: Rotator cuff:  The patient has thickening and increased T2 signal in the supraspinatus and infraspinatus tendons consistent with tendinopathy.  There is interstitial tearing at the junction of the supraspinatus and infraspinatus extending posteriorly into the infraspinatus.  No full-thickness tear or retracted tendon is identified.  The subscapularis appears normal. Muscles:  Preserved.  No atrophy or focal lesion. Biceps long head:  Intact.  Acromioclavicular Joint:  Bulky  acromioclavicular degenerative change is present. Glenohumeral Joint:  Unremarkable.  Labrum:  Intact. Bones:  The acromion is type 2.  Fluid is present subacromial/subdeltoid bursa.  IMPRESSION:  1.  Marked appearing supraspinatus and infraspinatus tendinopathy with interstitial tearing of both tendons.  No full-thickness tear or retracted tendon is identified.  2.  Bulky acromioclavicular degenerative disease.  3.  Subacromial/subdeltoid bursitis.   Original Report Authenticated By: Holley Dexter, M.D.    Dg Swallowing Func-speech Pathology  04/05/2012  Breck Coons [place], CCC-SLP     04/05/2012  1:08 PM Objective Swallowing Evaluation: Modified Barium Swallowing Study   Patient Details  Name: Collin Diaz MRN: [id number] Date of Birth: 1956/02/27  Today's Date: 04/05/2012 Time: 4098-1191 SLP Time Calculation (min): 15 min  Past Medical History:  Past Medical History  Diagnosis Date  . Hypertension   . Arthritis 04/04/2012  . Seizure 04/04/2012    POST CVA 12/2011  . RSD (reflex sympathetic dystrophy) 04/04/2012    LEFT HAND  . Dysphagia S/P CVA (cerebrovascular accident) 04/04/2012  . Aphasia 04/04/2012    HX OF POST cva  . Stroke 10/2010    LEFT mca cva ISCHEMIC  . 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 Chronic kidney disease   . Depression   . RSD (reflex sympathetic dystrophy) 2013    LEFT HAND   Past Surgical History:  Past Surgical History  Procedure Date  . No past surgeries    HPI:  The patient is a 55 y.o. 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 Patient had PEG for feedings and fluids until  03/25/12 (removed due to improved intake of solids, etc).  Was  scheduled for an [facility] yesterday at [facility].  He is admitted on the  current occasion with slurred speech and weakness in the right  upper extremity, with finding of new stroke on MRI ( Small acute  lacunar infarcts in the right corona radiata and inferior  lentiform nuclei.)       Assessment / Plan / Recommendation Clinical Impression  Dysphagia Diagnosis: Mild oral phase dysphagia;Moderate  pharyngeal phase dysphagia Clinical impression: Pt. exhibited mild oral dysphagia with  decreased mastication and transit.  Pharyngeal deficits are  primarily sensory based with delayed swallow initiation to the  pyriform sinuses.  Nectar thick barium was silently aspirated  before the swallow exacerbated by delayed swallow.  Chin tuck  posture did not prevent or reduce aspiration with nectar  consistency.  Barium pill stopped in his distal esophagus and was  transited into stomach  with additional bite of puree.  SLP  recommends continue Dys 2 and honey thick liquids with continued  ST follow up.       Treatment Recommendation  Therapy as outlined in treatment plan below    Diet Recommendation Dysphagia 2 (Fine chop);Honey-thick liquid   Liquid Administration via: Cup;No straw Medication Administration: Whole meds with puree Supervision: Full supervision/cueing for compensatory  strategies;Patient able to self feed Compensations: Slow rate;Small sips/bites Postural Changes and/or Swallow Maneuvers: Seated upright 90  degrees    Other  Recommendations Oral Care Recommendations: Oral care BID   Follow Up Recommendations   (to be determined)    Frequency and Duration min 2x/week  2 weeks       SLP Swallow Goals Patient will utilize recommended strategies during swallow to  increase swallowing safety with: Minimal cueing      Reason for Referral Objectively evaluate swallowing function   Oral Phase Oral Preparation/Oral Phase Oral Phase: Impaired Oral - Solids Oral - Regular: Weak lingual manipulation;Delayed oral transit   Pharyngeal Phase Pharyngeal Phase Pharyngeal Phase:  Impaired Pharyngeal - Honey Pharyngeal - Honey Cup: Delayed swallow initiation;Premature  spillage to pyriform sinuses;Pharyngeal residue -  valleculae;Pharyngeal residue - pyriform sinuses Pharyngeal - Nectar Pharyngeal - Nectar Cup: Delayed swallow initiation;Premature  spillage to pyriform sinuses;Penetration/Aspiration before  swallow;Moderate aspiration Penetration/Aspiration details (nectar cup): Material enters  airway, passes BELOW cords without attempt by patient to eject  out (silent aspiration) Pharyngeal - Solids Pharyngeal - Regular: Within functional limits Pharyngeal - Pill: Delayed swallow initiation;Premature spillage  to valleculae  Cervical Esophageal Phase       Darrow Bussing.Ed CCC-SLP Pager [phone]  04/05/2012  Cervical Esophageal Phase Cervical Esophageal Phase: [facility]            Medications:    . sodium chloride 75 mL/hr at 04/06/12 0622      . aspirin  300 mg Rectal Daily   Or  . aspirin  325 mg Oral Daily  . citalopram  10 mg Oral Daily  . doxazosin  8 mg Oral QHS  . fluticasone  1 spray Each Nare Daily  . gabapentin  100 mg Oral TID  . levETIRAcetam  500 mg Oral BID  . traZODone  50 mg Oral QHS    I  have reviewed scheduled and prn medications.  ASSESSMENT/RECOMMENDATIONS:  56 y.o. year-old AA male with a background history of multiple prior strokes (left MCA CVA 2012, right thalamic and basal ganglia hemorrhagic CVA with IVH 12/2011) , seizures post stroke, OSA, chronic dysphagia that required PEG (recently removed), RSD and hypertension, with baseline CKD (creatinine 2's), prior episode of AKI 12/2011 (volume depletion +/- urinary retention) Presented with new stroke symptoms and noted to have AKI withmore than doubling of serum creatinine in the 72 hours prior to admission, in the setting of ACE inhibitor, NSAID, relative hypotension and possible volume depletion. Also with symptoms of difficulty voiding and urinary retention PTA.   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

## 2012-04-06 NOTE — Progress Notes (Signed)
Patient has 1- 9 runs of V-tach around 0520. Resting quietly in bed with no signs of distress. Denies any chest pains or discomfort. Lenny Pastel NP notified via text. Magnesium level ordered.

## 2012-04-06 NOTE — Progress Notes (Signed)
[facility] PROGRESS NOTE  [name]:[id number] DOB: Dec 21, 1955 DOA: 04/04/2012 PCP: Jackie Plum, MD  Assessment/Plan: 1. Small acute lacunar infarcts in the right corona radiata and inferior lentiform nuclei--left vertebral artery high grade stenosis. Infarct felt to be thromboembolic. Not on anticoagulants prior to admission due to right basal ganglia infarct on 01/03/2012 with hemorrhage transformation (had R hemiparesis requiring CIR stay). Now on aspirin 325 mg orally every day for secondary stroke prevention. Patient with resultant right hemiparesis, dyarthria. Per neurology--Continue aspirin 325 mg orally every day for secondary stroke prevention. Follow-up with Dr. Pearlean Brownie in 2 months.  2. Acute renal failure--improving, complicated by HCTZ, lisinopril and ibuprofen as needed. AKI likely multifactorial (ACE, NSAID, possible urinary retention) plus prob vol depl. Creatinine continues to trend down with foley, IVF, d/c of ACE. Voiding trial as had urinary retention PTA. Avoid further use of ACE/ARB, Avoid further use of NSAID's. Expect creatinine will return to previous baseline 3. Thrombocytopenia--monitor. 4. Dysphagia--Patient had PEG for feedings and fluids until 03/25/12 (removed due to improved intake of solids, etc). Diet per ST. 5. History of CVA with left hemiplegia--two previous CVA's in the past. On 10/21/2011 He suffered a ischemic stroke in the left MCA distribution while in [place], [place] and then right BG infarct on 01/03/2012 with hemorrhagic transformation and cytotoxic edema causing transfalcine herniation necessitating hypertonic saline. He was left with "left hemiparesis, expressive aphasia and sever dysarthria" per stroke team note.  6. Right arm pain with decreased range of movement--asymptomatic today, feels better. MRI of the right shoulder revealed marked appearing supraspinatus and infraspinatus tendinopathy with interstitial tearing of both  tendons. No full-thickness tear or retracted tendon is identified. 7. Hypertension--Currently stable, continue to hold off on antihypertensive esp with new CVA, ARF 8. OSA 9. History of seizure--continue Keppra  Code Status: full code Family Communication: none present Disposition Plan: possibly home with family 12/13  Brendia Sacks, MD  [facility] Team 4  Pager [phone] If 7PM-7AM, please contact night-coverage at [email or web address], password [facility] 04/06/2012, 2:40 PM  LOS: 2 days   Brief narrative: 56 y.o. male with a history of a left hemiplegia as a result of an old right infarct who now presents with new right sided weakness and worsening of speech. Imaging reveals small acute lacunar infarcts in the right corona radiata and inferior lentiform nuclei. 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 Patient on no antiplatelet therapy due to a hemorrhagic infarct in September of this year.   Consultants:  Neurology  Nephrology  ST--DYS2, HONEY THICK LIQUIDS, Medication Administration: Whole meds with puree  OT--HH, 24 hour supervision  PT--HH  Procedures:  12/20 2D echocardiogram  12/20 Carotid Duplex (Doppler) has been completed. Preliminary findings: Bilateral: No evidence of hemodynamically significant internal carotid artery stenosis. Vertebral artery flow is antegrade  HPI/Subjective: No complaints. RN noted 9-beat V tach overnight, however in review of telemetry I see no event recorded. Patient denies shoulder pain, denies complaints.  Objective: Filed Vitals:   04/06/12 0200 04/06/12 0600 04/06/12 0955 04/06/12 1328  BP: 132/86 143/87 146/109 144/98  Pulse: 79 87 84 86  Temp: 98.1 F (36.7 C) 98.2 F (36.8 C) 97.5 F (36.4 C) 98.5 F (36.9 C)  TempSrc: Oral Oral Oral Oral  Resp: 18 18 18 18   Height:      Weight:      SpO2: 98% 98% 100% 98%    Intake/Output Summary (Last 24 hours) at 04/06/12 1440 Last data filed at 04/06/12  1610  Gross per 24 hour  Intake    120 ml  Output   1200 ml  Net  -1080 ml   Filed Weights   04/04/12 1500  Weight: 89.812 kg (198 lb)    Exam:  General:  Appears calm and comfortable Cardiovascular: RRR, no m/r/g. No LE edema. Telemetry: SR, no arrhythmias  Respiratory: CTA bilaterally, no w/r/r. Normal respiratory effort. Psychiatric: grossly normal mood and affect, speech fluent and appropriate  Data Reviewed: Basic Metabolic Panel:  Lab 04/06/12 9604 04/05/12 0455 04/04/12 0445 04/01/12 0930  NA 145 143 140 144  K 4.6 4.8 4.8 4.3  CL 110 109 104 106  CO2 20 22 22 24   GLUCOSE 92 96 96 111*  BUN 39* 56* 59* 27*  CREATININE 2.61* 3.89* 5.52* 2.27*  CALCIUM 9.4 9.3 9.3 10.0  MG 2.3 -- -- --  PHOS -- -- -- --   Liver Function Tests:  Lab 04/04/12 0445  AST 22  ALT 46  ALKPHOS 142*  BILITOT 0.5  PROT 7.1  ALBUMIN 3.7   CBC:  Lab 04/05/12 0455 04/04/12 0445 04/01/12 0930  WBC 4.2 4.7 3.7*  NEUTROABS -- 2.3 1.7  HGB 12.5* 12.6* 13.7  HCT 37.8* 39.2 41.0  MCV 89.4 90.3 89.3  PLT 137* 138* 162    Basename 04/04/12 0633  PROBNP 116.6     Recent Results (from the past 240 hour(s))  URINE CULTURE     Status: Normal   Collection Time   04/04/12  4:47 AM      Component Value Range Status Comment   Specimen Description URINE, RANDOM   Final    Special Requests NONE   Final    Culture  Setup Time 04/04/2012 08:34   Final    Colony Count NO GROWTH   Final    Culture NO GROWTH   Final    Report Status 04/05/2012 FINAL   Final      Studies: Mr Shoulder Right Wo Contrast  04/06/2012  *RADIOLOGY REPORT*  Clinical Data:  New onset right upper extremity weakness in patient with left hemiplegia.  MRI OF THE RIGHT SHOULDER WITHOUT CONTRAST  Technique:  Multiplanar, multisequence MR imaging of the right shoulder was performed.  No intravenous contrast was administered.  Comparison:  Plain films right shoulder 04/04/2012.  FINDINGS: Rotator cuff:  The patient has  thickening and increased T2 signal in the supraspinatus and infraspinatus tendons consistent with tendinopathy.  There is interstitial tearing at the junction of the supraspinatus and infraspinatus extending posteriorly into the infraspinatus.  No full-thickness tear or retracted tendon is identified.  The subscapularis appears normal. Muscles:  Preserved.  No atrophy or focal lesion. Biceps long head:  Intact.  Acromioclavicular Joint:  Bulky acromioclavicular degenerative change is present. Glenohumeral Joint:  Unremarkable.  Labrum:  Intact. Bones:  The acromion is type 2.  Fluid is present subacromial/subdeltoid bursa.  IMPRESSION:  1.  Marked appearing supraspinatus and infraspinatus tendinopathy with interstitial tearing of both tendons.  No full-thickness tear or retracted tendon is identified.  2.  Bulky acromioclavicular degenerative disease.  3.  Subacromial/subdeltoid bursitis.   Original Report Authenticated By: Holley Dexter, M.D.    Dg Swallowing Func-speech Pathology  04/05/2012  Breck Coons [place], CCC-SLP     04/05/2012  1:08 PM Objective Swallowing Evaluation: Modified Barium Swallowing Study   Patient Details  Name: Collin Diaz MRN: [id number] Date of Birth: Mar 11, 1956  Today's Date: 04/05/2012 Time: 4782-9562 SLP Time Calculation (min): 15 min  Past Medical History:  Past Medical History  Diagnosis Date  . Hypertension   . Arthritis 04/04/2012  . Seizure 04/04/2012    POST CVA 12/2011  . RSD (reflex sympathetic dystrophy) 04/04/2012    LEFT HAND  . Dysphagia S/P CVA (cerebrovascular accident) 04/04/2012  . Aphasia 04/04/2012    HX OF POST cva  . Stroke 10/2010    LEFT mca cva ISCHEMIC  . 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 Chronic kidney disease   . Depression   . RSD (reflex sympathetic dystrophy) 2013    LEFT HAND   Past Surgical History:  Past Surgical History  Procedure Date  . No past surgeries    HPI:  The patient is a 56 y.o. year-old male with a  background history  of multiple prior strokes (left MCA CVA 2012, right thalamic and  basal ganglia hemorrhagic CVA with IVH 12/2011) requiring stay on  rehab and ultimate D/C to SNF for a while, seizures post stroke,  OSA, chronic dysphagia that required PEG tube in the past, RSD  and hypertension. Patient had PEG for feedings and fluids until  03/25/12 (removed due to improved intake of solids, etc).  Was  scheduled for an OPMBS yesterday at [facility]. He is admitted on the  current occasion with slurred speech and weakness in the right  upper extremity, with finding of new stroke on MRI ( Small acute  lacunar infarcts in the right corona radiata and inferior  lentiform nuclei.)       Assessment / Plan / Recommendation Clinical Impression  Dysphagia Diagnosis: Mild oral phase dysphagia;Moderate  pharyngeal phase dysphagia Clinical impression: Pt. exhibited mild oral dysphagia with  decreased mastication and transit.  Pharyngeal deficits are  primarily sensory based with delayed swallow initiation to the  pyriform sinuses.  Nectar thick barium was silently aspirated  before the swallow exacerbated by delayed swallow.  Chin tuck  posture did not prevent or reduce aspiration with nectar  consistency.  Barium pill stopped in his distal esophagus and was  transited into stomach with additional bite of puree.  SLP  recommends continue Dys 2 and honey thick liquids with continued  ST follow up.       Treatment Recommendation  Therapy as outlined in treatment plan below    Diet Recommendation Dysphagia 2 (Fine chop);Honey-thick liquid   Liquid Administration via: Cup;No straw Medication Administration: Whole meds with puree Supervision: Full supervision/cueing for compensatory  strategies;Patient able to self feed Compensations: Slow rate;Small sips/bites Postural Changes and/or Swallow Maneuvers: Seated upright 90  degrees    Other  Recommendations Oral Care Recommendations: Oral care BID   Follow Up Recommendations   (to be  determined)    Frequency and Duration min 2x/week  2 weeks       SLP Swallow Goals Patient will utilize recommended strategies during swallow to  increase swallowing safety with: Minimal cueing      Reason for Referral Objectively evaluate swallowing function   Oral Phase Oral Preparation/Oral Phase Oral Phase: Impaired Oral - Solids Oral - Regular: Weak lingual manipulation;Delayed oral transit   Pharyngeal Phase Pharyngeal Phase Pharyngeal Phase: Impaired Pharyngeal - Honey Pharyngeal - Honey Cup: Delayed swallow initiation;Premature  spillage to pyriform sinuses;Pharyngeal residue -  valleculae;Pharyngeal residue - pyriform sinuses Pharyngeal - Nectar Pharyngeal - Nectar Cup: Delayed swallow initiation;Premature  spillage to pyriform sinuses;Penetration/Aspiration before  swallow;Moderate aspiration Penetration/Aspiration details (nectar cup): Material enters  airway, passes BELOW cords without  attempt by patient to eject  out (silent aspiration) Pharyngeal - Solids Pharyngeal - Regular: Within functional limits Pharyngeal - Pill: Delayed swallow initiation;Premature spillage  to valleculae  Cervical Esophageal Phase       Darrow Bussing.Ed CCC-SLP Pager [phone]  04/05/2012  Cervical Esophageal Phase Cervical Esophageal Phase: [place]             Scheduled Meds:   . aspirin  300 mg Rectal Daily   Or  . aspirin  325 mg Oral Daily  . citalopram  10 mg Oral Daily  . doxazosin  8 mg Oral QHS  . fluticasone  1 spray Each Nare Daily  . gabapentin  100 mg Oral TID  . levETIRAcetam  500 mg Oral BID  . traZODone  50 mg Oral QHS   Continuous Infusions:   . sodium chloride 75 mL/hr at 04/06/12 4540    Principal Problem:  *Acute-on-chronic renal failure Active Problems:  GOUT NOS  Malignant hypertension  Thalamic hemorrhage  Obesity (BMI 30.0-34.9)  OSA (obstructive sleep apnea)  Chronic ischemic left MCA stroke  Depression  Arthritis  Seizure  RSD (reflex sympathetic dystrophy)   Dysphagia S/P CVA (cerebrovascular accident)  Aphasia  Slurred speech  Right arm weakness  Cerebral embolism with cerebral infarction  Hemiplegia, unspecified, affecting dominant side  CVA (cerebral infarction)     Brendia Sacks, MD  [facility] Team 4  Pager [phone] If 7PM-7AM, please contact night-coverage at [email or web address], password [facility] 04/06/2012, 2:40 PM  LOS: 2 days   Time spent: 25 minutes

## 2012-04-06 NOTE — Progress Notes (Signed)
Meant one 9 runs of V-Tach.

## 2012-04-06 NOTE — Progress Notes (Signed)
PT said he does not wear a CPAP at home and doesn't want one here.

## 2012-04-07 LAB — BASIC METABOLIC PANEL
BUN: 29 mg/dL — ABNORMAL HIGH (ref 6–23)
CO2: 20 mEq/L (ref 19–32)
Calcium: 9.3 mg/dL (ref 8.4–10.5)
Creatinine, Ser: 2.11 mg/dL — ABNORMAL HIGH (ref 0.50–1.35)
GFR calc non Af Amer: 34 mL/min — ABNORMAL LOW (ref >90)
Glucose, Bld: 114 mg/dL — ABNORMAL HIGH (ref 70–99)
Sodium: 142 mEq/L (ref 135–145)

## 2012-04-07 NOTE — Progress Notes (Signed)
[facility] PROGRESS NOTE  Collin Diaz ZOX:[id number] DOB: 1955/04/26 DOA: 04/04/2012 PCP: Jackie Plum, MD  Assessment/Plan: 1. Small acute lacunar infarcts in the right corona radiata and inferior lentiform nuclei--left vertebral artery high grade stenosis. Infarct felt to be thromboembolic. Not on anticoagulants prior to admission due to right basal ganglia infarct on 01/03/2012 with hemorrhage transformation (had R hemiparesis requiring CIR stay). Now on aspirin 325 mg orally every day for secondary stroke prevention. Patient with resultant right hemiparesis, dyarthria. Per neurology--Continue aspirin 325 mg orally every day for secondary stroke prevention. Follow-up with Dr. Pearlean Brownie in 2 months.  2. Acute renal failure--improving, complicated by HCTZ, lisinopril and ibuprofen as needed. AKI likely multifactorial (ACE, NSAID, possible urinary retention) plus prob vol depl. Creatinine continues to trend down with foley, IVF, d/c of ACE. Voiding trial underway. Avoid further use of ACE/ARB, Avoid further use of NSAID's. Expect creatinine will return to previous baseline 3. Thrombocytopenia--monitor. 4. Dysphagia--Patient had PEG for feedings and fluids until 03/25/12 (removed due to improved intake of solids, etc). Diet per ST. 5. History of CVA with left hemiplegia--two previous CVA's in the past. On 10/21/2011 He suffered a ischemic stroke in the left MCA distribution while in [place], [place] and then right BG infarct on 01/03/2012 with hemorrhagic transformation and cytotoxic edema causing transfalcine herniation necessitating hypertonic saline. He was left with "left hemiparesis, expressive aphasia and sever dysarthria" per stroke team note.  6. Right arm pain with decreased range of movement--asymptomatic today, feels better. MRI of the right shoulder revealed marked appearing supraspinatus and infraspinatus tendinopathy with interstitial tearing of both tendons. No  full-thickness tear or retracted tendon is identified. 7. Hypertension--Currently stable, continue to hold off on antihypertensive esp with new CVA, ARF 8. OSA 9. History of seizure--continue Keppra  Code Status: full code Family Communication: none present Disposition Plan: possibly home with family 12/13  Brendia Sacks, MD  [facility] Team 4  Pager [phone] If 7PM-7AM, please contact night-coverage at [email or web address], password [facility] 04/07/2012, 6:01 PM  LOS: 3 days   Brief narrative: 56 y.o. male with a history of a left hemiplegia as a result of an old right infarct who now presents with new right sided weakness and worsening of speech. Imaging reveals small acute lacunar infarcts in the right corona radiata and inferior lentiform nuclei. 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 Patient on no antiplatelet therapy due to a hemorrhagic infarct in September of this year.   Consultants:  Neurology  Nephrology  ST--DYS2, HONEY THICK LIQUIDS, Medication Administration: Whole meds with puree  OT--HH, 24 hour supervision  PT--HH  Procedures:  12/20 2D echocardiogram  12/20 Carotid Duplex (Doppler) has been completed. Preliminary findings: Bilateral: No evidence of hemodynamically significant internal carotid artery stenosis. Vertebral artery flow is antegrade  HPI/Subjective: Feels fine, eating well, no complaints. Voiding without difficulty.  Objective: Filed Vitals:   04/06/12 2207 04/07/12 0700 04/07/12 0952 04/07/12 1533  BP: 167/107 165/106 148/98 169/110  Pulse: 82 86 91 79  Temp: 97.6 F (36.4 C) 98.3 F (36.8 C) 98.2 F (36.8 C) 98.1 F (36.7 C)  TempSrc: Oral Oral Oral Oral  Resp: 16 20 18 18   Height:      Weight:      SpO2: 98% 99% 99% 99%    Intake/Output Summary (Last 24 hours) at 04/07/12 1801 Last data filed at 04/07/12 0900  Gross per 24 hour  Intake    480 ml  Output    761  ml  Net   -281 ml   Filed Weights    04/04/12 1500  Weight: 89.812 kg (198 lb)    Exam:  General:  Appears calm and comfortable Cardiovascular: RRR, no m/r/g. No LE edema. Respiratory: CTA bilaterally, no w/r/r. Normal respiratory effort. Psychiatric: grossly normal mood and affect, speech fluent and appropriate  Data Reviewed: Basic Metabolic Panel:  Lab 04/07/12 6962 04/06/12 0500 04/05/12 0455 04/04/12 0445 04/01/12 0930  NA 142 145 143 140 144  K 4.5 4.6 4.8 4.8 4.3  CL 109 110 109 104 106  CO2 20 20 22 22 24   GLUCOSE 114* 92 96 96 111*  BUN 29* 39* 56* 59* 27*  CREATININE 2.11* 2.61* 3.89* 5.52* 2.27*  CALCIUM 9.3 9.4 9.3 9.3 10.0  MG -- 2.3 -- -- --  PHOS -- -- -- -- --   Liver Function Tests:  Lab 04/04/12 0445  AST 22  ALT 46  ALKPHOS 142*  BILITOT 0.5  PROT 7.1  ALBUMIN 3.7   CBC:  Lab 04/05/12 0455 04/04/12 0445 04/01/12 0930  WBC 4.2 4.7 3.7*  NEUTROABS -- 2.3 1.7  HGB 12.5* 12.6* 13.7  HCT 37.8* 39.2 41.0  MCV 89.4 90.3 89.3  PLT 137* 138* 162    Basename 04/04/12 0633  PROBNP 116.6     Recent Results (from the past 240 hour(s))  URINE CULTURE     Status: Normal   Collection Time   04/04/12  4:47 AM      Component Value Range Status Comment   Specimen Description URINE, RANDOM   Final    Special Requests NONE   Final    Culture  Setup Time 04/04/2012 08:34   Final    Colony Count NO GROWTH   Final    Culture NO GROWTH   Final    Report Status 04/05/2012 FINAL   Final      Studies: Mr Shoulder Right Wo Contrast  04/06/2012  *RADIOLOGY REPORT*  Clinical Data:  New onset right upper extremity weakness in patient with left hemiplegia.  MRI OF THE RIGHT SHOULDER WITHOUT CONTRAST  Technique:  Multiplanar, multisequence MR imaging of the right shoulder was performed.  No intravenous contrast was administered.  Comparison:  Plain films right shoulder 04/04/2012.  FINDINGS: Rotator cuff:  The patient has thickening and increased T2 signal in the supraspinatus and infraspinatus  tendons consistent with tendinopathy.  There is interstitial tearing at the junction of the supraspinatus and infraspinatus extending posteriorly into the infraspinatus.  No full-thickness tear or retracted tendon is identified.  The subscapularis appears normal. Muscles:  Preserved.  No atrophy or focal lesion. Biceps long head:  Intact.  Acromioclavicular Joint:  Bulky acromioclavicular degenerative change is present. Glenohumeral Joint:  Unremarkable.  Labrum:  Intact. Bones:  The acromion is type 2.  Fluid is present subacromial/subdeltoid bursa.  IMPRESSION:  1.  Marked appearing supraspinatus and infraspinatus tendinopathy with interstitial tearing of both tendons.  No full-thickness tear or retracted tendon is identified.  2.  Bulky acromioclavicular degenerative disease.  3.  Subacromial/subdeltoid bursitis.   Original Report Authenticated By: Holley Dexter, M.D.     Scheduled Meds:    . aspirin  300 mg Rectal Daily   Or  . aspirin  325 mg Oral Daily  . citalopram  10 mg Oral Daily  . doxazosin  8 mg Oral QHS  . fluticasone  1 spray Each Nare Daily  . gabapentin  100 mg Oral TID  . levETIRAcetam  500  mg Oral BID  . traZODone  50 mg Oral QHS   Continuous Infusions:    . sodium chloride 75 mL/hr at 04/06/12 1845    Principal Problem:  *Acute-on-chronic renal failure Active Problems:  GOUT NOS  Malignant hypertension  Thalamic hemorrhage  Obesity (BMI 30.0-34.9)  OSA (obstructive sleep apnea)  Chronic ischemic left MCA stroke  Depression  Arthritis  Seizure  RSD (reflex sympathetic dystrophy)  Dysphagia S/P CVA (cerebrovascular accident)  Aphasia  Slurred speech  Right arm weakness  Cerebral embolism with cerebral infarction  Hemiplegia, unspecified, affecting dominant side  CVA (cerebral infarction)     Brendia Sacks, MD  [facility] Team 4  Pager [phone] If 7PM-7AM, please contact night-coverage at [email or web address], password [email or web address] 04/07/2012, 6:01 PM   LOS: 3 days   Time spent:15 minutes

## 2012-04-08 ENCOUNTER — Ambulatory Visit: Payer: Medicaid Other | Admitting: Physical Medicine & Rehabilitation

## 2012-04-08 LAB — UIFE/LIGHT CHAINS/TP QN, 24-HR UR
Albumin, U: DETECTED
Alpha 1, Urine: DETECTED — AB
Alpha 2, Urine: DETECTED — AB
Beta, Urine: DETECTED — AB
Free Kappa/Lambda Ratio: 3.41 ratio (ref 2.04–10.37)
Total Protein, Urine: 16.8 mg/dL

## 2012-04-08 LAB — PROTEIN ELECTROPHORESIS, SERUM
Albumin ELP: 57.3 % (ref 55.8–66.1)
Alpha-1-Globulin: 5.3 % — ABNORMAL HIGH (ref 2.9–4.9)
Beta 2: 5.9 % (ref 3.2–6.5)
Beta Globulin: 5.6 % (ref 4.7–7.2)
Gamma Globulin: 15.2 % (ref 11.1–18.8)

## 2012-04-08 LAB — CBC
Hemoglobin: 12.7 g/dL — ABNORMAL LOW (ref 13.0–17.0)
MCH: 28.9 pg (ref 26.0–34.0)
MCHC: 33.2 g/dL (ref 30.0–36.0)
RDW: 13.8 % (ref 11.5–15.5)

## 2012-04-08 LAB — BASIC METABOLIC PANEL
BUN: 21 mg/dL (ref 6–23)
Calcium: 9.4 mg/dL (ref 8.4–10.5)
Creatinine, Ser: 1.82 mg/dL — ABNORMAL HIGH (ref 0.50–1.35)
GFR calc Af Amer: 47 mL/min — ABNORMAL LOW (ref >90)
GFR calc non Af Amer: 40 mL/min — ABNORMAL LOW (ref >90)
Glucose, Bld: 90 mg/dL (ref 70–99)

## 2012-04-08 MED ORDER — AMLODIPINE BESYLATE 10 MG PO TABS
10.0000 mg | ORAL_TABLET | Freq: Every day | ORAL | Status: DC
  Administered 2012-04-08 – 2012-04-09 (×2): 10 mg via ORAL
  Filled 2012-04-08 (×2): qty 1

## 2012-04-08 MED ORDER — METOPROLOL TARTRATE 25 MG PO TABS
25.0000 mg | ORAL_TABLET | Freq: Two times a day (BID) | ORAL | Status: DC
  Administered 2012-04-08 – 2012-04-09 (×3): 25 mg via ORAL
  Filled 2012-04-08 (×3): qty 1

## 2012-04-08 NOTE — Progress Notes (Signed)
[facility] PROGRESS NOTE  KINAN SAFLEY ZOX:[id number] DOB: 1955/12/15 DOA: 04/04/2012 PCP: Jackie Plum, MD  Assessment/Plan: 1. Small acute lacunar infarcts in the right corona radiata and inferior lentiform nuclei--left vertebral artery high grade stenosis. Infarct felt to be thromboembolic. Not on anticoagulants prior to admission due to right basal ganglia infarct on 01/03/2012 with hemorrhage transformation (had R hemiparesis requiring CIR stay). Now on aspirin 325 mg orally every day for secondary stroke prevention. Patient with resultant right hemiparesis, dysarthria. Per neurology--Continue aspirin 325 mg orally every day for secondary stroke prevention. Follow-up with Dr. Pearlean Brownie in 2 months. Dysphagia 2 (fine chop);Honey-thick liquid  2. Acute renal failure--appears resolved, complicated by HCTZ, lisinopril and ibuprofen as needed. AKI likely multifactorial (ACE, NSAID, possible urinary retention) plus prob vol depl. Creatinine at baseline. Voiding without difficulty. Avoid further use of ACE/ARB, Avoid further use of NSAID's.  3. Thrombocytopenia--monitor. 4. Dysphagia--Patient had PEG for feedings and fluids until 03/25/12 (removed due to improved intake of solids, etc). Diet per ST. 5. History of CVA with left hemiplegia--two previous CVA's in the past. On 10/21/2011 He suffered a ischemic stroke in the left MCA distribution while in [place], [place] and then right BG infarct on 01/03/2012 with hemorrhagic transformation and cytotoxic edema causing transfalcine herniation necessitating hypertonic saline. He was left with "left hemiparesis, expressive aphasia and sever dysarthria" per stroke team note.  6. Right arm pain with decreased range of movement--asymptomatic today, feels better. MRI of the right shoulder revealed marked appearing supraspinatus and infraspinatus tendinopathy with interstitial tearing of both tendons. No full-thickness tear or retracted tendon is  identified. 7. Hypertension--trending upwards. Continue Cardura. Add Norvasc. Consider adding beta blocker depending on response. Avoid resuming ACE inhibitor or diuretic at this point.  8. History of seizure--stable, continue Keppra  Code Status: full code Family Communication: none present Disposition Plan: possibly home with family 12/13  Brendia Sacks, MD  [facility] Team 4  Pager [phone] If 7PM-7AM, please contact night-coverage at [email or web address], password [facility] 04/08/2012, 2:22 PM  LOS: 4 days   Brief narrative: 56 y.o. male with a history of a left hemiplegia as a result of an old right infarct who now presents with new right sided weakness and worsening of speech. Imaging reveals small acute lacunar infarcts in the right corona radiata and inferior lentiform nuclei. 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 Patient on no antiplatelet therapy due to a hemorrhagic infarct in September of this year.   Patient was evaluated by neurology and aspirin was added. Therapy evaluations were completed with recommendations for return home with home health. Neurology signed off with no further recommendations other than aspirin and followup as an outpatient. Hospitalization was complicated by acute renal failure and patient was seen in consultation in nephrology. Etiology of renal failure was multifactorial in nature and resolved with holding of diuretic, ACE inhibitor and NSAIDs. Foley catheter was removed without complication or subsequent retention. Blood pressures trended up in the second agent has been added. Case discussed with his wife, if remains stable anticipate discharge tomorrow 12/24.  Consultants:  Neurology  Nephrology  ST--DYS2, HONEY THICK LIQUIDS, Medication Administration: Whole meds with puree  OT--HH, 24 hour supervision  PT--HH  Procedures:  12/20 2D echocardiogram--Left ventricle: The cavity size was normal. Wall thickness was  increased in a pattern of mild LVH. Systolic function was normal. The estimated ejection fraction was in the range of 55% to 60%. Wall motion was normal; there were no regional wall  motion abnormalities  12/20 Carotid Duplex (Doppler) No significant extracranial carotid artery stenosis demonstrated (<40%). Vertebrals are patent with antegrade flow.  HPI/Subjective: No right arm pain. No complaints. Eating well.  Objective: Filed Vitals:   04/08/12 0030 04/08/12 0243 04/08/12 0522 04/08/12 1000  BP:  161/114 168/107 176/112  Pulse: 79 85 83 75  Temp:   97.5 F (36.4 C) 98.1 F (36.7 C)  TempSrc:   Oral Oral  Resp: 18  17 18   Height:      Weight:      SpO2: 98% 98% 100% 100%    Intake/Output Summary (Last 24 hours) at 04/08/12 1422 Last data filed at 04/08/12 0300  Gross per 24 hour  Intake      0 ml  Output    825 ml  Net   -825 ml   Filed Weights   04/04/12 1500  Weight: 89.812 kg (198 lb)    Exam:  General:  Appears calm and comfortable, her client in chair. Cardiovascular: RRR, no m/r/g.  Telemetry: Sinus rhythm. No arrhythmias. Respiratory: CTA bilaterally, no w/r/r. Normal respiratory effort. Psychiatric: grossly normal mood and affect, speech slightly dysarthric but appropriate  Data Reviewed: Basic Metabolic Panel:  Lab 04/08/12 1610 04/07/12 0635 04/06/12 0500 04/05/12 0455 04/04/12 0445  NA 140 142 145 143 140  K 4.3 4.5 4.6 4.8 4.8  CL 108 109 110 109 104  CO2 21 20 20 22 22   GLUCOSE 90 114* 92 96 96  BUN 21 29* 39* 56* 59*  CREATININE 1.82* 2.11* 2.61* 3.89* 5.52*  CALCIUM 9.4 9.3 9.4 9.3 9.3  MG -- -- 2.3 -- --  PHOS -- -- -- -- --   Liver Function Tests:  Lab 04/04/12 0445  AST 22  ALT 46  ALKPHOS 142*  BILITOT 0.5  PROT 7.1  ALBUMIN 3.7   CBC:  Lab 04/08/12 0731 04/05/12 0455 04/04/12 0445  WBC 4.1 4.2 4.7  NEUTROABS -- -- 2.3  HGB 12.7* 12.5* 12.6*  HCT 38.3* 37.8* 39.2  MCV 87.2 89.4 90.3  PLT 135* 137* 138*    Basename  04/04/12 0633  PROBNP 116.6     Recent Results (from the past 240 hour(s))  URINE CULTURE     Status: Normal   Collection Time   04/04/12  4:47 AM      Component Value Range Status Comment   Specimen Description URINE, RANDOM   Final    Special Requests NONE   Final    Culture  Setup Time 04/04/2012 08:34   Final    Colony Count NO GROWTH   Final    Culture NO GROWTH   Final    Report Status 04/05/2012 FINAL   Final      Studies: No results found.  Scheduled Meds:    . aspirin  325 mg Oral Daily  . citalopram  10 mg Oral Daily  . doxazosin  8 mg Oral QHS  . fluticasone  1 spray Each Nare Daily  . gabapentin  100 mg Oral TID  . levETIRAcetam  500 mg Oral BID  . traZODone  50 mg Oral QHS   Continuous Infusions:    . sodium chloride 75 mL/hr at 04/07/12 2224    Principal Problem:  *Acute-on-chronic renal failure Active Problems:  GOUT NOS  Malignant hypertension  Thalamic hemorrhage  Obesity (BMI 30.0-34.9)  OSA (obstructive sleep apnea)  Chronic ischemic left MCA stroke  Depression  Arthritis  Seizure  RSD (reflex sympathetic dystrophy)  Dysphagia  S/P CVA (cerebrovascular accident)  Aphasia  Slurred speech  Right arm weakness  Cerebral embolism with cerebral infarction  Hemiplegia, unspecified, affecting dominant side  CVA (cerebral infarction)     Brendia Sacks, MD  [facility] Team 4  Pager [phone] If 7PM-7AM, please contact night-coverage at [email or web address], password [facility] 04/08/2012, 2:22 PM  LOS: 4 days   Time spent: 30 minutes

## 2012-04-08 NOTE — Evaluation (Signed)
Speech Language Pathology Evaluation Patient Details Name: Collin Diaz MRN: [id number] DOB: [birth year] Today's Date: 04/08/2012 Time: 4098-1191 SLP Time Calculation (min): 33 min  Problem List:  Patient Active Problem List  Diagnosis  . GOUT NOS  . Malignant hypertension  . ALLERGIC RHINITIS  . KIDNEY DISEASE, CHRONIC, STAGE V  . FACIAL RASH  . Thalamic hemorrhage  . Induced hypernatremia  . Obesity (BMI 30.0-34.9)  . OSA (obstructive sleep apnea)  . Renal insufficiency  . Chronic ischemic left MCA stroke  . Aphasia as late effect of cerebrovascular accident  . Depression  . Syncope  . Acute-on-chronic renal failure  . Arthritis  . Seizure  . RSD (reflex sympathetic dystrophy)  . Dysphagia S/P CVA (cerebrovascular accident)  . Aphasia  . Slurred speech  . Right arm weakness  . Cerebral embolism with cerebral infarction  . Hemiplegia, unspecified, affecting dominant side  . CVA (cerebral infarction)   Past Medical History:  Past Medical History  Diagnosis Date  . Hypertension   . Arthritis 04/04/2012  . Seizure 04/04/2012    POST CVA 12/2011  . RSD (reflex sympathetic dystrophy) 04/04/2012    LEFT HAND  . Dysphagia S/P CVA (cerebrovascular accident) 04/04/2012  . Aphasia 04/04/2012    HX OF POST cva  . Stroke 10/2010    LEFT mca cva ISCHEMIC  . 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 Chronic kidney disease   . Depression   . RSD (reflex sympathetic dystrophy) 2013    LEFT HAND   Past Surgical History:  Past Surgical History  Procedure Date  . No past surgeries    HPI:  56 year old male admitted 04/04/12 with dysarthria and RUE weakness. PMH significant for multiple prior CVAs, seizures, OSA, chronic dysphagia, RSD, HTN, recent removal of PEG tube (03/25/12). Seen for [facility] 04/05/12.   Assessment / Plan / Recommendation Clinical Impression  Pt presents with significant cognitive deficits.  Portions of the MoCA were  administered. Pt scored 3/3 on naming subtest, was able to repeat 5 digits foward/ 3 backward, listen for cued letter, and repeat sentence length material.  Pt was unable to complete serial 7 subtraction, demonstrated poor verbal fluency and abstract reasoning.  Poor immediate and delayed recall of 5 words (recalled 3/5 with multiple choice cue, 1/5 with category cue.  Pt utilized environmental cues to answer orientation questions, all accurate except city. Pt required ongoing cues for repetition, due to low vocal volume and poor intelligibility.  At this point, it is uncertain which deficits existed prior to this admission, however, copgnitive deficits PTA are anticipated due to pt history.    SLP Assessment  Patient needs continued Speech Lanaguage Pathology Services    Follow Up Recommendations  24 hour supervision/assistance    Frequency and Duration min 2x/week  2 weeks   Pertinent Vitals/Pain None reported   SLP Goals  SLP Goals Potential to Achieve Goals: Fair Potential Considerations: Co-morbidities;Previous level of function;Family/community support;Ability to learn/carryover information SLP Goal #1: Pt will recall strategies to increase intelligibility using environmental cues, with 80% accuracy given mod assist. SLP Goal #2: Pt will identify at least one effective strategy to improve recall/recognition with mod assist. SLP Goal #3: Pt will complete verbal problem solving tasks with 80% accuracy given mod assist.  SLP Evaluation Prior Functioning  Cognitive/Linguistic Baseline: Baseline deficits Baseline deficit details: Pt with multiple strokes Education: high school graduate   Cognition  Overall Cognitive Status: Impaired at baseline  Arousal/Alertness: Awake/alert Orientation Level: Oriented to person;Oriented to time;Oriented to place;Disoriented to situation Attention: Focused;Sustained;Selective Focused Attention: Appears intact Sustained Attention: Impaired Sustained  Attention Impairment: Verbal basic;Functional basic Selective Attention: Impaired Selective Attention Impairment: Verbal basic;Functional basic Memory: Impaired Memory Impairment: Storage deficit;Retrieval deficit Awareness: Impaired Awareness Impairment: Intellectual impairment;Emergent impairment;Anticipatory impairment Problem Solving: Impaired Problem Solving Impairment: Verbal basic;Functional basic    Comprehension  Auditory Comprehension Yes/No Questions: Within Functional Limits Commands: Within Functional Limits (simple/ 1-step commands) Conversation: Simple Interfering Components: Processing speed EffectiveTechniques: Repetition;Increased volume;Extra processing time Visual Recognition/Discrimination Discrimination: Within Function Limits Reading Comprehension Reading Status: Not tested    Expression Expression Primary Mode of Expression: Verbal Verbal Expression Overall Verbal Expression: Appears within functional limits for tasks assessed Level of Generative/Spontaneous Verbalization: Phrase;Word Written Expression Written Expression: Not tested   Oral / Motor Motor Speech Overall Motor Speech: Impaired Respiration: Within functional limits Phonation: Low vocal intensity Resonance: Within functional limits Articulation: Impaired Level of Impairment: Phrase Intelligibility: Intelligibility reduced Word: 50-74% accurate Phrase: 25-49% accurate Sentence: 25-49% accurate Conversation: 0-24% accurate Motor Planning: Witnin functional limits Interfering Components: Premorbid status Effective Techniques: Increased vocal intensity   Deloyce Walthers B. Wrens, [facility], CCC-SLP [phone]   [name] 04/08/2012, 9:59 AM

## 2012-04-08 NOTE — Progress Notes (Signed)
Speech Language Pathology Dysphagia Treatment Patient Details Name: Collin Diaz MRN: [id number] DOB: Feb 18, 1956 Today's Date: 04/08/2012 Time: 0865-7846 SLP Time Calculation (min): 20 min  Assessment / Plan / Recommendation Clinical Impression  Pt observed during breakfast meal.  Poor po intake, however, pt exhibits no overt s/s aspiration with dys 2 or honey thick consistencies.  Pt requires set up and assist, but was not noted to be impulsive regarding bolus size or rate.    Diet Recommendation  Continue with Current Diet: Dysphagia 2 (fine chop);Honey-thick liquid    SLP Plan Continue with current plan of care   Pertinent Vitals/Pain None reported   Swallowing Goals  SLP Swallowing Goals Patient will consume recommended diet without observed clinical signs of aspiration with: Set-up;Supervision/safety Swallow Study Goal #1 - Progress: Progressing toward goal Patient will utilize recommended strategies during swallow to increase swallowing safety with: Minimal cueing Swallow Study Goal #2 - Progress: Progressing toward goal  General Temperature Spikes Noted: No Respiratory Status: Room air Behavior/Cognition: Alert;Cooperative;Pleasant mood;Requires cueing Patient Positioning: Upright in bed  Oral Cavity - Oral Hygiene Does patient have any of the following "at risk" factors?: Other - dysphagia;Diet - patient on thickened liquids Brush patient's teeth BID with toothbrush (using toothpaste with fluoride): Yes   Dysphagia Treatment Treatment focused on: Skilled observation of diet tolerance;Utilization of compensatory strategies Treatment Methods/Modalities: Skilled observation Patient observed directly with PO's: Yes Type of PO's observed: Dysphagia 2 (chopped);Honey-thick liquids Feeding: Needs assist Liquids provided via: Cup Type of cueing: Verbal Amount of cueing: Minimal  [name], [facility], CCC-SLP [phone]   [name] 04/08/2012, 10:05  AM

## 2012-04-08 NOTE — Progress Notes (Signed)
PT does not wear a CPAP

## 2012-04-09 DIAGNOSIS — R569 Unspecified convulsions: Secondary | ICD-10-CM

## 2012-04-09 DIAGNOSIS — E669 Obesity, unspecified: Secondary | ICD-10-CM

## 2012-04-09 DIAGNOSIS — M109 Gout, unspecified: Secondary | ICD-10-CM

## 2012-04-09 DIAGNOSIS — I69920 Aphasia following unspecified cerebrovascular disease: Secondary | ICD-10-CM

## 2012-04-09 LAB — BASIC METABOLIC PANEL
CO2: 20 mEq/L (ref 19–32)
Calcium: 9.6 mg/dL (ref 8.4–10.5)
Chloride: 105 mEq/L (ref 96–112)
Creatinine, Ser: 1.84 mg/dL — ABNORMAL HIGH (ref 0.50–1.35)
GFR calc Af Amer: 46 mL/min — ABNORMAL LOW (ref >90)
Sodium: 138 mEq/L (ref 135–145)

## 2012-04-09 LAB — PROTEIN ELECTROPHORESIS, SERUM
Albumin ELP: 56 % (ref 55.8–66.1)
Beta Globulin: 5.8 % (ref 4.7–7.2)
M-Spike, %: NOT DETECTED g/dL
Total Protein ELP: 5.9 g/dL — ABNORMAL LOW (ref 6.0–8.3)

## 2012-04-09 LAB — CBC
MCV: 86.5 fL (ref 78.0–100.0)
Platelets: 140 10*3/uL — ABNORMAL LOW (ref 150–400)
RBC: 4.8 MIL/uL (ref 4.22–5.81)
RDW: 13.5 % (ref 11.5–15.5)
WBC: 5.7 10*3/uL (ref 4.0–10.5)

## 2012-04-09 MED ORDER — METOPROLOL TARTRATE 25 MG PO TABS: 25.0000 mg | ORAL_TABLET | Freq: Two times a day (BID) | ORAL | Status: DC

## 2012-04-09 MED ORDER — AMLODIPINE BESYLATE 10 MG PO TABS: 10.0000 mg | ORAL_TABLET | Freq: Every day | ORAL | Status: DC

## 2012-04-09 MED ORDER — RESOURCE THICKENUP CLEAR PO POWD: ORAL | Status: DC

## 2012-04-09 MED ORDER — ASPIRIN 325 MG PO TABS: 325.0000 mg | ORAL_TABLET | Freq: Every day | ORAL | Status: DC

## 2012-04-09 NOTE — Progress Notes (Signed)
RN reviewed discharge instructions with pt. Pt IV already remove and vitals are stable. EMS transported pt home.

## 2012-04-09 NOTE — Care Management Note (Signed)
    Page 1 of 2   04/09/2012     4:45:22 PM   CARE MANAGEMENT NOTE 04/09/2012  Patient:  Collin Diaz, Collin Diaz   Account Number:  [id number]  Date Initiated:  04/05/2012  Documentation initiated by:  [facility]  Subjective/Objective Assessment:   Admitted with RUE weakness, slurred speech  Patient already active with [facility] for [facility], PT, OT and ST     Action/Plan:   PT/OT evals-recommending HHPT and HHOT   Anticipated DC Date:  04/09/2012   Anticipated DC Plan:  HOME W HOME HEALTH SERVICES  In-house referral  Clinical Social Worker      DC Planning Services  CM consult      Choice offered to / List presented to:  C-1 Patient        HH arranged  HH-1 RN  HH-2 PT  HH-3 OT  HH-4 NURSE'S AIDE  HH-5 SPEECH THERAPY  HH-6 SOCIAL WORKER      HH agency  [facility]   Status of service:  Completed, signed off Medicare Important Message given?   (If response is "NO", the following Medicare IM given date fields will be blank) Date Medicare IM given:   Date Additional Medicare IM given:    Discharge Disposition:  HOME W HOME HEALTH SERVICES  Per UR Regulation:  Reviewed for med. necessity/level of care/duration of stay  If discussed at Long Length of Stay Meetings, dates discussed:    Comments:  04/09/12 Dava Najjar at [facility] and requested [facility], PT, OT, ST, aide and SW. CSW consulted for ambulance transprt home.  Jacquelynn Cree RN, BSN, CCM

## 2012-04-09 NOTE — Discharge Summary (Signed)
Physician Discharge Summary  Patient ID: SHINICHI ANGUIANO MRN: [id number] DOB/AGE: [birth year] 56 y.o.  Admit date: 04/04/2012 Discharge date: 04/09/2012  Primary Care Physician:  Jackie Plum, MD  Discharge Diagnoses:    . Small acute lacunar infarcts in the right corona radiata and inferior lentiform nuclei . right shoulder supraspinatus and infraspinatus tendinopathy . Malignant hypertension . Acute-on-chronic renal failure: Improving  . Thalamic hemorrhage . Depression . OSA (obstructive sleep apnea) . Arthritis . Seizure . RSD (reflex sympathetic dystrophy) . Aphasia and  Slurred speech from recent left MCA infarct with hemorrhagic transformation in 9/ 2013  . Right arm weakness . Obesity (BMI 30.0-34.9)  Consults:  Neurology, Dr Pearlean Brownie                    Nephrology, Dr. Eliott Nine   Discharge Medications:   Medication List     As of 04/09/2012 11:15 AM    STOP taking these medications         feeding supplement (OSMOLITE 1.5 CAL) Liqd      hydrochlorothiazide 12.5 MG capsule   Commonly known as: MICROZIDE      ibuprofen 800 MG tablet   Commonly known as: ADVIL,MOTRIN      lisinopril 5 MG tablet   Commonly known as: PRINIVIL,ZESTRIL      TAKE these medications         amLODipine 10 MG tablet   Commonly known as: NORVASC   Take 1 tablet (10 mg total) by mouth daily.      aspirin 325 MG tablet   Take 1 tablet (325 mg total) by mouth daily.      citalopram 10 MG tablet   Commonly known as: CELEXA   Take 10 mg by mouth daily.      doxazosin 8 MG tablet   Commonly known as: CARDURA   Take 8 mg by mouth at bedtime.      fluticasone 50 MCG/ACT nasal spray   Commonly known as: FLONASE   Place 1 spray into the nose daily.      gabapentin 100 MG capsule   Commonly known as: NEURONTIN   Take 1 capsule (100 mg total) by mouth 3 (three) times daily.      levETIRAcetam 100 MG/ML solution   Commonly known as: KEPPRA   Take 500 mg by mouth 2 (two) times  daily.      metoprolol tartrate 25 MG tablet   Commonly known as: LOPRESSOR   Take 1 tablet (25 mg total) by mouth 2 (two) times daily.      RESOURCE THICKENUP CLEAR Powd   Honey thick, As needed      traMADol 50 MG tablet   Commonly known as: ULTRAM   Take 50 mg by mouth 4 (four) times daily as needed. For pain      traZODone 50 MG tablet   Commonly known as: DESYREL   Take 50 mg by mouth at bedtime.         Brief H and P: For complete details please refer to admission H and P, but in brief, patient is a 56 y.o. 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 The patient's wife reported that patient had incomprehensible slurred speech x1 day. Patient's wife also endorse some wheezing and decreased urine output. Patient had denied any fever, chills, chest pain or any shortness of breath.  Patient was seen in the emergency room x-rays of the right shoulder and right upper extremity were negative for fracture. Compressive metabolic profile obtained had a BUN of 59 and a creatinine of 5.5, First set of troponin was negative. CBC had a white count of 4.7 hemoglobin of 12.6 daily, 138 otherwise was within normal limits. Chest x-ray was negative for infiltrate. Patient was admitted for further workup   Hospital Course:  1. Acute CVA: Small acute  lacunar infarcts in the right corona radiata and inferior lentiform nuclei--MRI of the brain was obtained which showed small acute infarcts in right corona radiata and inferior lentiform nuclei. MRA showed left vertebral artery high grade stenosis. Infarct felt to be thromboembolic. Not on anticoagulants prior to admission due to right basal ganglia infarct on 01/03/2012 with hemorrhage transformation (had R hemiparesis requiring CIR stay). Now on aspirin 325 mg orally every day for secondary stroke prevention. Patient with resultant right hemiparesis, dysarthria. Per neurology--Continue aspirin 325 mg orally every day for secondary stroke prevention. Follow-up with Dr. Pearlean Brownie in 2 months. Dysphagia 2 (fine chop);Honey-thick liquid  2. Acute renal failure--appears resolved, complicated by HCTZ, lisinopril and ibuprofen as needed. AKI likely multifactorial (ACE, NSAID, possible urinary retention) plus prob vol depl. Creatinine at baseline. Voiding without difficulty. Avoid further use of ACE/ARB, Avoid further use of NSAID's.  3. Thrombocytopenia--monitor. 4. Dysphagia--Patient had PEG for feedings and fluids until 03/25/12 (removed due to improved intake of solids, etc). Patient was followed by speech therapy throughout the hospitalization, currently tolerating dysphagia 2 diet with honey thick liquids.  5. History of CVA with left hemiplegia--two previous CVA's in the past. On 10/21/2011 He suffered a ischemic stroke in the left MCA distribution while in [place], [place] and then right BG infarct on 01/03/2012 with hemorrhagic transformation and cytotoxic edema causing transfalcine herniation necessitating hypertonic saline. He was left with "left hemiparesis, expressive aphasia and sever dysarthria" per stroke team note.  6. Right arm pain with decreased range of movement--improved. MRI of the right shoulder revealed marked appearing supraspinatus and infraspinatus tendinopathy with interstitial tearing of  both tendons. No full-thickness tear or retracted tendon is identified. 7. Hypertension--trending upwards. Continue Cardura, placed on Norvasc and beta blocker. Avoid resuming ACE inhibitor or diuretic at this point.  8. History of seizure--stable, continue Keppra   Day of Discharge BP 137/95  Pulse 73  Temp 98.2 F (36.8 C) (Oral)  Resp 18  Ht 5\' 10"  (1.778 m)  Wt 89.812 kg (198 lb)  BMI 28.41 kg/m2  SpO2 99%  Physical Exam:  General: Alert and awake, oriented x3, not in any acute distress, slight dysarthria.  HEENT: anicteric sclera, pupils reactive to light and accommodation, EOMI  CVS: S1-S2 clear, no murmur rubs or gallops  Chest: clear to auscultation bilaterally, no wheezing, rales or rhonchi  Abdomen: soft nontender, nondistended, normal bowel sounds, no organomegaly  Extremities: no cyanosis, clubbing or edema noted bilaterally  Neuro: Left-sided hemiplegia(from previous stroke), 4/5 right side   The results of significant diagnostics from  this hospitalization (including imaging, microbiology, ancillary and laboratory) are listed below for reference.    LAB RESULTS: Basic Metabolic Panel:  Lab 04/09/12 9604 04/08/12 0731 04/06/12 0500  NA 138 140 --  K 4.4 4.3 --  CL 105 108 --  CO2 20 21 --  GLUCOSE 131* 90 --  BUN 23 21 --  CREATININE 1.84* 1.82* --  CALCIUM 9.6 9.4 --  MG -- -- 2.3  PHOS -- -- --   Liver Function Tests:  Lab 04/04/12 0445  AST 22  ALT 46  ALKPHOS 142*  BILITOT 0.5  PROT 7.1  ALBUMIN 3.7   CBC:  Lab 04/09/12 0720 04/08/12 0731 04/04/12 0445  WBC 5.7 4.1 --  NEUTROABS -- -- 2.3  HGB 14.0 12.7* --  HCT 41.5 38.3* --  MCV 86.5 -- --  PLT 140* 135* --    Significant Diagnostic Studies:  Dg Chest 2 View  04/04/2012  *RADIOLOGY REPORT*  Clinical Data: Stroke  CHEST - 2 VIEW  Comparison: 04/04/2012  Findings: Cardiomediastinal silhouette is stable.  No acute infiltrate or pleural effusion.  No pulmonary edema.  Mild basilar  atelectasis.  Degenerative changes thoracic spine.  IMPRESSION: No acute infiltrate or pulmonary edema.  Mild basilar atelectasis. Degenerative changes thoracic spine   Original Report Authenticated By: Natasha Mead, M.D.    Dg Shoulder Right  04/04/2012  *RADIOLOGY REPORT*  Clinical Data: Right shoulder pain  RIGHT SHOULDER - 2+ VIEW  Comparison: None.  Findings: Acromioclavicular degenerative change.  Glenohumeral joint remains located.  Right upper lung is clear.  No acute fracture.  IMPRESSION: No acute osseous abnormality of the right shoulder.  Acromioclavicular DJD.   Original Report Authenticated By: Jearld Lesch, M.D.    Ct Head Wo Contrast  04/04/2012  *RADIOLOGY REPORT*  Clinical Data: Slurred speech and right upper extremity weakness and numbness.  CT HEAD WITHOUT CONTRAST  Technique:  Contiguous axial images were obtained from the base of the skull through the vertex without contrast.  Comparison: 03/14/2012.  Findings: The ventricles are stable.  Asymmetric enlargement of the left lateral ventricle is again noted.  Advanced periventricular white matter disease for age and remote lacunar type infarcts.  No CT findings for acute hemispheric infarction and/or intracranial hemorrhage.  No extra-axial fluid collections.  The brainstem and cerebellum are grossly normal and stable.  No mass lesions.  The bony structures are intact.  The paranasal sinuses mastoid air cells are clear.  IMPRESSION:  1.  Significant chronic white matter changes for age. 2.  Stable remote lacunar type infarcts. 3.  No CT findings for acute hemispheric infarction or intracranial hemorrhage.   Original Report Authenticated By: Rudie Meyer, M.D.    Mr Brain Wo Contrast  04/04/2012  *RADIOLOGY REPORT*  Clinical Data:  56 year old male who is unresponsive.  Slurred speech and right side weakness.  Comparison: Head CTs 04/04/2012 and earlier.  MRI HEAD WITHOUT CONTRAST  Technique: Multiplanar, multiecho pulse sequences of  the brain and surrounding structures were obtained according to standard protocol without intravenous contrast.  Findings: Multifocal chronic bilateral deep gray matter nuclei hemorrhages and lacunar infarcts, see series 9.  Associated deep gray matter encephalomalacia and ex vacuo enlargement of the left lateral ventricle.  Additional confluent diffuse white matter T2 and FLAIR hyperintensity.  Similar confluent T2 signal in the pons.  There are small areas of restricted diffusion at the inferior right lentiform nuclei (series 5 image 14) and along the right periventricular white matter (image 18).  Major intracranial vascular flow voids are preserved.  Negative pituitary, cervicomedullary junction and visualized cervical spine. Normal bone marrow signal.  Hyperostosis of the calvarium. Scattered additional chronic micro hemorrhages in the brain other than those described above. No acute intracranial hemorrhage identified.  Visualized orbit soft tissues are within normal limits.  Minor maxillary sinus mucosal thickening.  Other Visualized paranasal sinuses and mastoids are clear.  Small volume retained secretions in the pharynx.  Negative scalp soft tissues.  IMPRESSION: 1.  Small acute lacunar infarcts in the right corona radiata and inferior lentiform nuclei.  No mass effect or acute hemorrhage. 2.  Underlying very severe chronic small vessel disease with multiple chronic deep gray matter hemorrhages. 3.  MRA findings are below.  MRA HEAD WITHOUT CONTRAST  Technique: Angiographic images of the Circle of Willis were obtained using MRA technique without  intravenous contrast.  Findings: Antegrade flow in the distal right vertebral artery without stenosis.  Poorer/intermittent antegrade flow in the distal left vertebral artery.  Little to no flow in the left V4 segment. The right PICA origin is patent.  The left PICA is not identified. The left AICA origin is patent and that vessel may be dominant.  Basilar artery  irregularity without significant stenosis.  SCA and left PCA origins normal.  Fetal type right PCA.  Bilateral PCA branches are within normal limits.  Antegrade flow in both ICA siphons.  Right ICA mildly dominant probably owing to the fetal right PCA type anatomy.  No ICA stenosis.  Ophthalmic artery origins and right posterior communicating artery origin are within normal limits.  Normal carotid termini.  Normal MCA and ACA origins.  Diminutive or absent anterior communicating artery.  Visualized ACA branches are within normal limits.  Visualized bilateral MCA branches are within normal limits.  IMPRESSION: 1.  Distal left vertebral artery high-grade stenosis and/or occlusion.  Favor chronic given absence of associated acute MRI findings in the posterior fossa. 2.  Mild distal right vertebral artery atherosclerosis without significant stenosis. 3.  Otherwise negative intracranial MRA.   Original Report Authenticated By: Erskine Speed, M.D.    US Renal  04/04/2012  *RADIOLOGY REPORT*  Clinical Data: Acute on chronic renal failure  RENAL/URINARY TRACT ULTRASOUND COMPLETE  Comparison:  Prior renal ultrasound 12/06/2005  Findings:  Right Kidney:  The right kidney measures 8.8 cm in length which has decreased compared to 9.6 cm in 2007.  There is no focal cortical thinning.  The renal parenchyma is diffusely echogenic.  No focal solid lesion identified.  No hydronephrosis.  Left Kidney:  The left kidney measures 9 cm in length which is decreased from 10.6 cm in 2007.  No focal solid lesion or focal cortical thinning.  The renal parenchyma is diffusely echogenic.  Bladder:  Foley catheter identified within a collapsed bladder  IMPRESSION:  1.  No hydronephrosis 2.  Small echogenic kidneys bilaterally consistent with underlying medical renal disease.  The kidneys have slightly decreased in size compared to the renal ultrasound dated 12/06/2005.   Original Report Authenticated By: Malachy Moan, M.D.    Dg Chest  Port 1 View  04/04/2012  *RADIOLOGY REPORT*  Clinical Data: Right shoulder pain  PORTABLE CHEST - 1 VIEW  Comparison: 12/30/2011  Findings: Prominent cardiomediastinal contours, similar to prior. Interstitial prominence.  Of the hemidiaphragms.  Interstitial and vascular crowding.  Multilevel degenerative changes.  No pneumothorax.  No definite pleural effusion.  No acute osseous finding.  IMPRESSION: Prominent cardiomediastinal contours, similar to prior.  Mild  interstitial prominence may reflect chronic change or mild edema.   Original Report Authenticated By: Jearld Lesch, M.D.    Mr Mra Head/brain Wo Cm  04/04/2012  *RADIOLOGY REPORT*  Clinical Data:  56 year old male who is unresponsive.  Slurred speech and right side weakness.  Comparison: Head CTs 04/04/2012 and earlier.  MRI HEAD WITHOUT CONTRAST  Technique: Multiplanar, multiecho pulse sequences of the brain and surrounding structures were obtained according to standard protocol without intravenous contrast.  Findings: Multifocal chronic bilateral deep gray matter nuclei hemorrhages and lacunar infarcts, see series 9.  Associated deep gray matter encephalomalacia and ex vacuo enlargement of the left lateral ventricle.  Additional confluent diffuse white matter T2 and FLAIR hyperintensity.  Similar confluent T2 signal in the pons.  There are small areas of restricted diffusion at the inferior right lentiform nuclei (series 5 image 14) and along the right periventricular white matter (image 18).  Major intracranial vascular flow voids are preserved.  Negative pituitary, cervicomedullary junction and visualized cervical spine. Normal bone marrow signal.  Hyperostosis of the calvarium. Scattered additional chronic micro hemorrhages in the brain other than those described above. No acute intracranial hemorrhage identified.  Visualized orbit soft tissues are within normal limits.  Minor maxillary sinus mucosal thickening.  Other Visualized paranasal  sinuses and mastoids are clear.  Small volume retained secretions in the pharynx.  Negative scalp soft tissues.  IMPRESSION: 1.  Small acute lacunar infarcts in the right corona radiata and inferior lentiform nuclei.  No mass effect or acute hemorrhage. 2.  Underlying very severe chronic small vessel disease with multiple chronic deep gray matter hemorrhages. 3.  MRA findings are below.  MRA HEAD WITHOUT CONTRAST  Technique: Angiographic images of the Circle of Willis were obtained using MRA technique without  intravenous contrast.  Findings: Antegrade flow in the distal right vertebral artery without stenosis.  Poorer/intermittent antegrade flow in the distal left vertebral artery.  Little to no flow in the left V4 segment. The right PICA origin is patent.  The left PICA is not identified. The left AICA origin is patent and that vessel may be dominant.  Basilar artery irregularity without significant stenosis.  SCA and left PCA origins normal.  Fetal type right PCA.  Bilateral PCA branches are within normal limits.  Antegrade flow in both ICA siphons.  Right ICA mildly dominant probably owing to the fetal right PCA type anatomy.  No ICA stenosis.  Ophthalmic artery origins and right posterior communicating artery origin are within normal limits.  Normal carotid termini.  Normal MCA and ACA origins.  Diminutive or absent anterior communicating artery.  Visualized ACA branches are within normal limits.  Visualized bilateral MCA branches are within normal limits.  IMPRESSION: 1.  Distal left vertebral artery high-grade stenosis and/or occlusion.  Favor chronic given absence of associated acute MRI findings in the posterior fossa. 2.  Mild distal right vertebral artery atherosclerosis without significant stenosis. 3.  Otherwise negative intracranial MRA.   Original Report Authenticated By: Erskine Speed, M.D.      Disposition and Follow-up:     Discharge Orders    Future Appointments: Provider: Department: Dept  Phone: Center:   04/12/2012 1:00 PM [facility]l-Dg R/F 1 [facility] [phone] [place]   05/24/2012 10:30 AM Erick Colace, MD [facility] [phone] None     Future Orders Please Complete By Expires   Increase activity slowly      Discharge instructions  Comments:   Discharge diet: dysphagia 2, honey thick liquids       DISPOSITION: Home. Home PT, OT, RN, speech therapy, home health aide, social worker was arranged  DIET: Dysphagia 2 with honey thick liquids     DISCHARGE FOLLOW-UP Follow-up Information    Follow up with OSEI-BONSU,GEORGE, MD. Schedule an appointment as soon as possible for a visit in 14 days. (for hospital follow-up)    Contact information:   [address], [address] [place] [address] [phone]       Follow up with Gates Rigg, MD. Schedule an appointment as soon as possible for a visit in 2 months. (for stroke follow-up)    Contact information:   [address], [address] [facility] [place] [address] [phone]          Time spent on Discharge: 40 mins  Signed:   RAI,RIPUDEEP M.D. [facility] 04/09/2012, 11:15 AM Pager: [phone]

## 2012-04-09 NOTE — Progress Notes (Signed)
Physical Therapy Treatment Patient Details Name: Collin Diaz MRN: [id number] DOB: 08-11-1955 Today's Date: 04/09/2012 Time: 5621-3086 PT Time Calculation (min): 23 min  PT Assessment / Plan / Recommendation Comments on Treatment Session  Pt cont's to require significant +2 total (A) for all mobility.   Progressing slowly.      Follow Up Recommendations  Home health PT;Supervision/Assistance - 24 hour     Does the patient have the potential to tolerate intense rehabilitation     Barriers to Discharge        Equipment Recommendations  None recommended by PT    Recommendations for Other Services    Frequency Min 4X/week   Plan Discharge plan remains appropriate    Precautions / Restrictions Precautions Precautions: Fall Precaution Comments: L hemiplegia, pain due to spasticity. Restrictions Weight Bearing Restrictions: No       Mobility  Bed Mobility Bed Mobility: Supine to Sit;Sitting - Scoot to Edge of Bed;Sit to Supine Supine to Sit: 1: +2 Total assist;HOB elevated;With rails Supine to Sit: Patient Percentage: 30% Sitting - Scoot to Edge of Bed: 1: +1 Total assist Sit to Supine: HOB flat;1: +2 Total assist Sit to Supine: Patient Percentage: 10% Details for Bed Mobility Assistance: Max cues for sequencing & technique.  Exited bed from Lt side & encouraged pt to reach across body with RUE for rail to (A) with sitting upright.  (A) for LE's & to lift torso to sitting upright, along with use of draw pad to pivot hips around to EOB & scoot closer to EOB.   Transfers Transfers: Sit to Stand;Stand to Sit Sit to Stand: 1: +2 Total assist;From bed Sit to Stand: Patient Percentage: 50% Stand to Sit: 1: +2 Total assist;To bed Stand to Sit: Patient Percentage: 30% Details for Transfer Assistance: Max cues for sequencing & technique.  (A) to lift hips off bed, facilitation at pelvis & chest to promote increased upright stance but unsuccessful.  blocking of Bil knees to prevent  buckling.   Performed transfers 4x's.   Ambulation/Gait Ambulation/Gait Assistance: Not tested (comment)      PT Goals Acute Rehab PT Goals Time For Goal Achievement: 04/19/12 Potential to Achieve Goals: Fair Pt will go Supine/Side to Sit: with mod assist PT Goal: Supine/Side to Sit - Progress: Not met Pt will Sit at [facility] of Bed: with mod assist PT Goal: Sit at Edge Of Bed - Progress: Progressing toward goal Pt will go Sit to Supine/Side: with mod assist PT Goal: Sit to Supine/Side - Progress: Not met Pt will Transfer Bed to Chair/Chair to Bed: with mod assist  Visit Information  Last PT Received On: 04/09/12 Assistance Needed: +2    Subjective Data      Cognition  Overall Cognitive Status: Impaired Area of Impairment:  (questionable historian) Arousal/Alertness: Awake/alert Orientation Level: Appears intact for tasks assessed Behavior During Session: [facility] for tasks performed    Balance  Balance Balance Assessed: Yes Static Sitting Balance Static Sitting - Balance Support: Feet supported;Right upper extremity supported Static Sitting - Level of Assistance: 3: Mod assist;5: Stand by assistance Static Sitting - Comment/# of Minutes: 10; Had pt prop his Rt forearm on his Rt knee to promote trunk flexion/anterior translation due to pt with posterior lean.  Pt unable to self-correct sitting midline when he began to lean backwards.    End of Session PT - End of Session Equipment Utilized During Treatment: Gait belt Activity Tolerance: Patient tolerated treatment well Patient left: in bed;with call bell/phone within  reach Nurse Communication: Mobility status      Verdell Face, [place] [phone] 04/09/2012

## 2012-04-09 NOTE — Progress Notes (Signed)
Pt wife has called rn three times stating she had to finish shopping and go to the grocery store then would come and do the discharge paperwork for the patient. Explained to wife that the patient had several prescriptions that needed to be filled and we were waiting on her to get home so we could call the ambulance for transport. The wife called rn again at 1745 stating she still had to go to the grocery store and then she would come to the hospital.

## 2012-04-11 ENCOUNTER — Other Ambulatory Visit (HOSPITAL_COMMUNITY): Payer: Self-pay

## 2012-04-11 ENCOUNTER — Ambulatory Visit (HOSPITAL_COMMUNITY): Admission: RE | Admit: 2012-04-11 | Payer: Self-pay | Source: Ambulatory Visit

## 2012-04-12 ENCOUNTER — Ambulatory Visit (HOSPITAL_COMMUNITY): Payer: Self-pay

## 2012-04-12 ENCOUNTER — Other Ambulatory Visit (HOSPITAL_COMMUNITY): Payer: Self-pay

## 2012-04-26 ENCOUNTER — Telehealth: Payer: Self-pay

## 2012-04-26 NOTE — Telephone Encounter (Signed)
Left message for patients wife to callback regarding her call.

## 2012-04-26 NOTE — Telephone Encounter (Signed)
Patients wife called, did not leave reason for call.

## 2012-04-29 NOTE — Telephone Encounter (Signed)
Left message returning patients call

## 2012-04-30 NOTE — Telephone Encounter (Signed)
Left another message for patient to follow up on phone call.

## 2012-05-24 ENCOUNTER — Ambulatory Visit (HOSPITAL_COMMUNITY)
Admission: RE | Admit: 2012-05-24 | Discharge: 2012-05-24 | Disposition: A | Discharge status: Home / Self Care | Payer: Medicaid Other | Source: Ambulatory Visit | Attending: Internal Medicine | Admitting: Internal Medicine

## 2012-05-24 ENCOUNTER — Encounter: Payer: Medicaid Other | Attending: Physical Medicine & Rehabilitation

## 2012-05-24 ENCOUNTER — Encounter: Payer: Self-pay | Admitting: Physical Medicine & Rehabilitation

## 2012-05-24 ENCOUNTER — Ambulatory Visit (HOSPITAL_BASED_OUTPATIENT_CLINIC_OR_DEPARTMENT_OTHER): Payer: Medicaid Other | Admitting: Physical Medicine & Rehabilitation

## 2012-05-24 VITALS — BP 104/67 | HR 61 | Resp 14 | Ht 70.0 in | Wt 198.0 lb

## 2012-05-24 DIAGNOSIS — I69320 Aphasia following cerebral infarction: Secondary | ICD-10-CM

## 2012-05-24 DIAGNOSIS — R1310 Dysphagia, unspecified: Secondary | ICD-10-CM | POA: Insufficient documentation

## 2012-05-24 DIAGNOSIS — I69920 Aphasia following unspecified cerebrovascular disease: Secondary | ICD-10-CM

## 2012-05-24 DIAGNOSIS — I619 Nontraumatic intracerebral hemorrhage, unspecified: Secondary | ICD-10-CM

## 2012-05-24 DIAGNOSIS — I69959 Hemiplegia and hemiparesis following unspecified cerebrovascular disease affecting unspecified side: Secondary | ICD-10-CM | POA: Insufficient documentation

## 2012-05-24 DIAGNOSIS — I610 Nontraumatic intracerebral hemorrhage in hemisphere, subcortical: Secondary | ICD-10-CM

## 2012-05-24 DIAGNOSIS — G8100 Flaccid hemiplegia affecting unspecified side: Secondary | ICD-10-CM

## 2012-05-24 NOTE — Progress Notes (Signed)
Subjective:    Patient ID: Collin Diaz, male    DOB: 1955/05/07, 57 y.o.   MRN: [id number]  HPI Patient is now at home. Stays with his wife. She will need to call additional help for transfers.  He has been eating very well. His wife has been carefully writing down how much fluids and solids he eats. This appears to be adequate over the last 2 weeks  1 ED visit for elevated blood pressure now improved Had left subcortical infarct not seen on CT with transient right-sided weakness. MRI brain 12/19 IMPRESSION:  1. Small acute lacunar infarcts in the right corona radiata and  inferior lentiform nuclei. No mass effect or acute hemorrhage.  2. Underlying very severe chronic small vessel disease with  multiple chronic deep gray matter hemorrhages.  3. MRA findings are below. He is now recovered and mainly has his chronic left hemiparesis. He does have a history of left MCA infarct Less than one year ago,causing aphasia however his speech is improving. Pain Inventory Average Pain 0 Pain Right Now 0 My pain is no pain  In the last 24 hours, has pain interfered with the following? General activity 0 Relation with others 0 Enjoyment of life 0 What TIME of day is your pain at its worst? night Sleep (in general) Good  Pain is worse with: some activites Pain improves with: rest Relief from Meds: 4  Mobility walk with assistance how many minutes can you walk? 0 ability to climb steps?  no do you drive?  no  Function disabled: date disabled  I need assistance with the following:  dressing, bathing, toileting, meal prep, household duties and shopping  Neuro/Psych weakness trouble walking depression  Prior Studies Any changes since last visit?  no *RADIOLOGY REPORT*  Clinical Data: New onset right upper extremity weakness in patient  with left hemiplegia.  MRI OF THE RIGHT SHOULDER WITHOUT CONTRAST  Technique: Multiplanar, multisequence MR imaging of the right  shoulder was  performed. No intravenous contrast was administered.  Comparison: Plain films right shoulder 04/04/2012.  FINDINGS:  Rotator cuff: The patient has thickening and increased T2 signal  in the supraspinatus and infraspinatus tendons consistent with  tendinopathy. There is interstitial tearing at the junction of the  supraspinatus and infraspinatus extending posteriorly into the  infraspinatus. No full-thickness tear or retracted tendon is  identified. The subscapularis appears normal.  Muscles: Preserved. No atrophy or focal lesion.  Biceps long head: Intact.  Acromioclavicular Joint: Bulky acromioclavicular degenerative  change is present.  Glenohumeral Joint: Unremarkable.  Labrum: Intact.  Bones: The acromion is type 2. Fluid is present  subacromial/subdeltoid bursa.  IMPRESSION:  1. Marked appearing supraspinatus and infraspinatus tendinopathy  with interstitial tearing of both tendons. No full-thickness tear  or retracted tendon is identified.  2. Bulky acromioclavicular degenerative disease.  3. Subacromial/subdeltoid bursitis.  Physicians involved in your care Any changes since last visit?  no   History reviewed. No pertinent family history. History   Social History  . Marital Status: Married    Spouse Name: N/A    Number of Children: N/A  . Years of Education: N/A   Social History Main Topics  . Smoking status: Former Smoker -- 30 years    Types: Cigarettes    Quit date: 04/18/1991  . Smokeless tobacco: Never Used  . Alcohol Use: Yes     Comment: OCCASSIONAL  . Drug Use: No  . Sexually Active:    Other Topics Concern  . None  Social History Narrative  . None   Past Surgical History  Procedure Date  . No past surgeries    Past Medical History  Diagnosis Date  . Hypertension   . Arthritis 04/04/2012  . Seizure 04/04/2012    POST CVA 12/2011  . RSD (reflex sympathetic dystrophy) 04/04/2012    LEFT HAND  . Dysphagia S/P CVA (cerebrovascular accident)  04/04/2012  . Aphasia 04/04/2012    HX OF POST cva  . Stroke 10/2010    LEFT mca cva ISCHEMIC  . 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 Chronic kidney disease   . Depression   . RSD (reflex sympathetic dystrophy) 2013    LEFT HAND   BP 104/67  Pulse 61  Resp 14  Ht 5\' 10"  (1.778 m)  Wt 198 lb (89.812 kg)  BMI 28.41 kg/m2  SpO2 96%    Review of Systems  Respiratory: Positive for cough, shortness of breath and wheezing.   Hematological: Bruises/bleeds easily.  All other systems reviewed and are negative.       Objective:   Physical Exam  Constitutional: He appears well-developed.  Obese ,But has lost weight compared to prior visitNeurological: He is alert.  0/5 strength in the Left deltoid, biceps, triceps, grip 1/5 in the knee and hip extensor synergy trace in the ankle Right side is 5/5 in the deltoid, biceps, triceps, grip, hip flexor, knee extensors, ankle dose flexors plantar flexor Could not fully assess sensation due to to communication problems  Psychiatric: His affect is blunt. His speech is delayed. He is slowed and withdrawn. Cognition and memory are impaired.        Assessment & Plan:  1. 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 Please note that prior office visit stated that he had right spastic hemiparesis. This is incorrect. Cognitive deficits related to prior infarct plus left MCA infarct which occurred prior to the right thalamic hemorrhage. In addition the patient has had small subcortical infarcts in the Right corona radiata in DecemberAnd his strength has declined further on the left side. Fortunately his swallowing has not been significantly affected by the new stroke. He does go for a modified barium swallow today to see if he can come off his thickeners for liquids. I'll see him back in about 3 months

## 2012-05-24 NOTE — Patient Instructions (Signed)
swallowing test across the street Today at 1 PM They will instruct you whether or not you can start treating thin liquids again.

## 2012-05-24 NOTE — Procedures (Signed)
Objective Swallowing Evaluation: Modified Barium Swallowing Study  Patient Details  Name: RAQUEL SAYRES MRN: [id number] Date of Birth: Oct 16, 1955  Today's Date: 05/24/2012 Time: 1210-1252 SLP Time Calculation (min): 42 min  Past Medical History:  Past Medical History  Diagnosis Date  . Hypertension   . Arthritis 04/04/2012  . Seizure 04/04/2012    POST CVA 12/2011  . RSD (reflex sympathetic dystrophy) 04/04/2012    LEFT HAND  . Dysphagia S/P CVA (cerebrovascular accident) 04/04/2012  . Aphasia 04/04/2012    HX OF POST cva  . Stroke 10/2010    LEFT mca cva ISCHEMIC  . 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 Chronic kidney disease   . Depression   . RSD (reflex sympathetic dystrophy) 2013    LEFT HAND   Past Surgical History:  Past Surgical History  Procedure Date  . No past surgeries    HPI:  Patient is now at home. Stays with his wife. She will need to call additional help for transfers.       Assessment / Plan / Recommendation Clinical Impression  Dysphagia Diagnosis: Mild oral phase dysphagia;Mild pharyngeal phase dysphagia Clinical impression: Patient's dysphagia is gradually improving, now with a mild oropharyngeal sensorimotor dysphagia, with primary functional impact of trace, silent aspiraton of thin liquids, secondary to delayed swallow initiation and decreased laryngeal elevation and vocal cord closure.  A chin tuck did appear to provide increased airway protection, but was not consistent with each swallow.  In-depth education was provided to the patient and his wife re: results, recommendations to continue nectar thick liquids, and for use ot the [place] Water Protocol (allowing small 1/2 tsp. sips of H20, up to 1/4 cup, 2 times per day, only after thorugh oral care)..    Treatment Recommendation  Defer treatment plan to SLP at (Comment) [facility])    Diet Recommendation Dysphagia 3 (Mechanical Soft);Nectar-thick liquid   Liquid  Administration via: Cup (By spoon with water protocol) Medication Administration: Whole meds with puree Supervision: Patient able to self feed;Full supervision/cueing for compensatory strategies Compensations: Slow rate;Small sips/bites Postural Changes and/or Swallow Maneuvers: Seated upright 90 degrees;Out of bed for meals;Chin tuck    Other  Recommendations Recommended Consults: MBS;Other (Comment) (Pt. will need MBS prior to d/cing thick liquids (silent asp)) Oral Care Recommendations: Oral care BID;Oral care before and after PO (per water protocol) Other Recommendations: Order thickener from pharmacy;Clarify dietary restrictions   Follow Up Recommendations  Home health SLP    Frequency and Duration        Pertinent Vitals/Pain N/A    SLP Swallow Goals     General HPI: Patient is now at home. Stays with his wife. She will need to call additional help for transfers.   Type of Study: Modified Barium Swallowing Study Reason for Referral: Objectively evaluate swallowing function Previous Swallow Assessment: Most recent MBS 04/04/12 revealed silent aspiration of Nectar thick liquids.  Recommended Dysphagia 2 with Honey thick liquids. Diet Prior to this Study: Dysphagia 3 (soft);Nectar-thick liquids (Being followed by home health SLP) Temperature Spikes Noted: No Respiratory Status: Room air History of Recent Intubation: No Behavior/Cognition: Alert;Cooperative;Requires cueing;Decreased sustained attention Oral Cavity - Dentition: Adequate natural dentition Patient Positioning: Upright in chair Baseline Vocal Quality: Clear;Low vocal intensity Volitional Cough: Weak Anatomy: Within functional limits Pharyngeal Secretions: Not observed secondary MBS    Reason for Referral Objectively evaluate swallowing function   Oral Phase Oral Preparation/Oral Phase Oral Phase: Impaired Oral - Solids  Oral - Mechanical Soft: Weak lingual manipulation   Pharyngeal Phase Pharyngeal  Phase Pharyngeal Phase: Impaired Pharyngeal - Nectar Pharyngeal - Nectar Cup: Premature spillage to valleculae;Delayed swallow initiation Pharyngeal - Thin Pharyngeal - Thin Cup: Delayed swallow initiation;Premature spillage to pyriform sinuses;Reduced laryngeal elevation;Reduced airway/laryngeal closure;Penetration/Aspiration during swallow;Trace aspiration Penetration/Aspiration details (thin cup): Material enters airway, passes BELOW cords without attempt by patient to eject out (silent aspiration) Pharyngeal - Solids Pharyngeal - Puree: Delayed swallow initiation;Premature spillage to valleculae Pharyngeal - Mechanical Soft: Delayed swallow initiation;Premature spillage to valleculae  Cervical Esophageal Phase    GO    Cervical Esophageal Phase Cervical Esophageal Phase: [facility]    Functional Assessment Tool Used: MBS; skilled observation Functional Limitations: Swallowing Swallow Goal Status ([id number]): At least 20 percent but less than 40 percent impaired, limited or restricted Swallow Discharge Status [phone]): At least 1 percent but less than 20 percent impaired, limited or restricted    Maryjo Rochester T 05/24/2012, 1:58 PM

## 2012-08-16 ENCOUNTER — Encounter: Payer: Self-pay | Admitting: Physical Medicine & Rehabilitation

## 2012-08-16 ENCOUNTER — Ambulatory Visit (HOSPITAL_BASED_OUTPATIENT_CLINIC_OR_DEPARTMENT_OTHER): Payer: Medicaid Other | Admitting: Physical Medicine & Rehabilitation

## 2012-08-16 ENCOUNTER — Encounter: Payer: Medicaid Other | Attending: Physical Medicine & Rehabilitation

## 2012-08-16 VITALS — BP 141/96 | HR 67 | Resp 16 | Ht 71.0 in | Wt 231.0 lb

## 2012-08-16 DIAGNOSIS — I69959 Hemiplegia and hemiparesis following unspecified cerebrovascular disease affecting unspecified side: Secondary | ICD-10-CM | POA: Insufficient documentation

## 2012-08-16 DIAGNOSIS — G8110 Spastic hemiplegia affecting unspecified side: Secondary | ICD-10-CM

## 2012-08-16 DIAGNOSIS — R1310 Dysphagia, unspecified: Secondary | ICD-10-CM | POA: Insufficient documentation

## 2012-08-16 NOTE — Progress Notes (Signed)
Subjective:    Patient ID: Collin Diaz, male    DOB: 08/18/1955, 57 y.o.   MRN: [id number]  HPI History of thalamic hemorrhage At home wife is caring for him. Eating well drinking well. No ER visits. Not stretching fingers much. Pain Inventory Average Pain 9 Pain Right Now 0 My pain is sharp and tingling  In the last 24 hours, has pain interfered with the following? General activity 9 Relation with others 8 Enjoyment of life 8 What TIME of day is your pain at its worst? night Sleep (in general) Good  Pain is worse with: some activites Pain improves with: therapy/exercise Relief from Meds: 5  Mobility ability to climb steps?  no do you drive?  no use a wheelchair needs help with transfers Do you have any goals in this area?  no  Function Do you have any goals in this area?  no  Neuro/Psych trouble walking  Prior Studies Any changes since last visit?  no  Physicians involved in your care Any changes since last visit?  no   History reviewed. No pertinent family history. History   Social History  . Marital Status: Married    Spouse Name: N/A    Number of Children: N/A  . Years of Education: N/A   Social History Main Topics  . Smoking status: Former Smoker -- 30 years    Types: Cigarettes    Quit date: 04/18/1991  . Smokeless tobacco: Never Used  . Alcohol Use: Yes     Comment: OCCASSIONAL  . Drug Use: No  . Sexually Active:    Other Topics Concern  . None   Social History Narrative  . None   Past Surgical History  Procedure Laterality Date  . No past surgeries     Past Medical History  Diagnosis Date  . Hypertension   . Arthritis 04/04/2012  . Seizure 04/04/2012    POST CVA 12/2011  . RSD (reflex sympathetic dystrophy) 04/04/2012    LEFT HAND  . Dysphagia S/P CVA (cerebrovascular accident) 04/04/2012  . Aphasia 04/04/2012    HX OF POST cva  . Stroke 10/2010    LEFT mca cva ISCHEMIC  . Stroke 12/30/11    R THALAMIC AND BASAL  GANGLIA HEMMORRHAIC cva  . Chronic kidney disease   . Depression   . RSD (reflex sympathetic dystrophy) 2013    LEFT HAND   BP 141/96  Pulse 67  Resp 16  Ht 5\' 11"  (1.803 m)  Wt 231 lb (104.781 kg)  BMI 32.23 kg/m2  SpO2 97%     Review of Systems  Constitutional: Positive for diaphoresis and unexpected weight change.  Musculoskeletal: Positive for gait problem.  Skin: Positive for rash.  All other systems reviewed and are negative.       Objective:   Physical Exam  Ashworth grade 3 spasticity right finger flexors including thumb flexor. No hygiene problems in the hand. No significant wrist spasticity. Mild elbow flexor spasticity. Left shoulder minimal pain with range of motion he is able to achieve full range      Assessment & Plan:  1. 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 Please note that prior office visit stated that he had right spastic hemiparesis. This is incorrect.  Cognitive deficits related to prior infarct plus left MCA infarct which occurred prior to the right thalamic hemorrhage.  In addition the patient has had small subcortical infarcts in the Right corona radiata in DecemberAnd his strength has declined further on the  left side.  Fortunately his swallowing has not been significantly affected by the new stroke.   We'll schedule for Botox injection left FDS left FDP left FPL 200 units

## 2012-08-30 ENCOUNTER — Telehealth: Payer: Self-pay

## 2012-08-30 NOTE — Telephone Encounter (Signed)
Patient wife called, did not say what she needed.  Left message for her to call back.

## 2012-09-01 ENCOUNTER — Encounter (HOSPITAL_COMMUNITY): Payer: Self-pay | Admitting: Emergency Medicine

## 2012-09-01 ENCOUNTER — Emergency Department (HOSPITAL_COMMUNITY): Payer: Medicaid Other

## 2012-09-01 ENCOUNTER — Emergency Department (HOSPITAL_COMMUNITY)
Admission: EM | Admit: 2012-09-01 | Discharge: 2012-09-02 | Disposition: A | Discharge status: Home / Self Care | Payer: Medicaid Other | Attending: Emergency Medicine | Admitting: Emergency Medicine

## 2012-09-01 DIAGNOSIS — F329 Major depressive disorder, single episode, unspecified: Secondary | ICD-10-CM | POA: Insufficient documentation

## 2012-09-01 DIAGNOSIS — Z87891 Personal history of nicotine dependence: Secondary | ICD-10-CM | POA: Insufficient documentation

## 2012-09-01 DIAGNOSIS — I129 Hypertensive chronic kidney disease with stage 1 through stage 4 chronic kidney disease, or unspecified chronic kidney disease: Secondary | ICD-10-CM | POA: Insufficient documentation

## 2012-09-01 DIAGNOSIS — Z7982 Long term (current) use of aspirin: Secondary | ICD-10-CM | POA: Insufficient documentation

## 2012-09-01 DIAGNOSIS — G9050 Complex regional pain syndrome I, unspecified: Secondary | ICD-10-CM | POA: Insufficient documentation

## 2012-09-01 DIAGNOSIS — Z8673 Personal history of transient ischemic attack (TIA), and cerebral infarction without residual deficits: Secondary | ICD-10-CM

## 2012-09-01 DIAGNOSIS — R51 Headache: Secondary | ICD-10-CM | POA: Insufficient documentation

## 2012-09-01 DIAGNOSIS — Z79899 Other long term (current) drug therapy: Secondary | ICD-10-CM | POA: Insufficient documentation

## 2012-09-01 DIAGNOSIS — M129 Arthropathy, unspecified: Secondary | ICD-10-CM | POA: Insufficient documentation

## 2012-09-01 DIAGNOSIS — I69991 Dysphagia following unspecified cerebrovascular disease: Secondary | ICD-10-CM | POA: Insufficient documentation

## 2012-09-01 DIAGNOSIS — I10 Essential (primary) hypertension: Secondary | ICD-10-CM

## 2012-09-01 DIAGNOSIS — I69959 Hemiplegia and hemiparesis following unspecified cerebrovascular disease affecting unspecified side: Secondary | ICD-10-CM | POA: Insufficient documentation

## 2012-09-01 DIAGNOSIS — R569 Unspecified convulsions: Secondary | ICD-10-CM | POA: Insufficient documentation

## 2012-09-01 DIAGNOSIS — N189 Chronic kidney disease, unspecified: Secondary | ICD-10-CM | POA: Insufficient documentation

## 2012-09-01 DIAGNOSIS — F3289 Other specified depressive episodes: Secondary | ICD-10-CM | POA: Insufficient documentation

## 2012-09-01 LAB — POCT I-STAT TROPONIN I: Troponin i, poc: 0.01 ng/mL (ref 0.00–0.08)

## 2012-09-01 NOTE — ED Notes (Signed)
Patient's wife did say he had "three" accidents yesterday.  She said he had diarrhea.

## 2012-09-01 NOTE — ED Notes (Signed)
Pt is aware he needs a urine sample 

## 2012-09-01 NOTE — ED Notes (Signed)
According to EMS, the patient's wife takes his blood pressure every thirty minutes and she called EMS because his blood pressure systolic was in the 170's.  The patient does have a history of a previous CVA and has permanent paralyzed on the left side.  He is be bound at home.  The wife was worried about his BP since it is high, and he has had a previous CVA.  The patient was Lisinopril, taken off for about a month and put back on it again about a week ago.  According to the wife it is not working.  The patient's wife wants him to be evaluated because his pressure is high and he has a headache and his appointment is not until the second week in June.

## 2012-09-01 NOTE — ED Notes (Signed)
Pt placed on a gown and vitals noted. Pt placed on a 12 lead due to pt hx.

## 2012-09-01 NOTE — ED Notes (Signed)
Advised the patient we need a urine sample. 

## 2012-09-01 NOTE — Progress Notes (Signed)
57 yo man with prior history of stroke with left sided paralysis who has had worsening hypertension and headache.  Exam shows him awake and alert.  Left sided paralysis.  BP appx 160/80 tonight.  Lab workup and CT of head ordered.  12:30 A.M.  Lab workup to this point is benign.  Pt's wife wants him to have physical therapy evaluation, with the hope that he will regain the ability to walk.  I advised her that we could not arrange that evaluation in the middle of the night, but that case managers could work that out for her in the morning.

## 2012-09-01 NOTE — ED Notes (Signed)
Unable to introduce myself to the patient at this time, as he is in radiology.

## 2012-09-01 NOTE — ED Notes (Signed)
Old and new EKG given to Dr Ignacia Palma

## 2012-09-01 NOTE — ED Provider Notes (Signed)
History     CSN: [id number]  Arrival date & time 09/01/12  2136   First MD Initiated Contact with Patient 09/01/12 2231      Chief Complaint  Patient presents with  . Hypertension    (Consider location/radiation/quality/duration/timing/severity/associated sxs/prior treatment) HPI Comments: Patient with a prior history of CVA with residual left sided weakness, HTN, and Chronic Kidney Disease presents today with high blood pressure.  Wife reports that she monitors his blood pressure very closely at home and logs the readings in a book.  His blood pressure has been as high as 188/113 earlier today.  He is currently on Metoprolol 25 mg BID for HTN.  His wife reports that he was also started back on his Lisinopril one week ago.  He did take his morning dose of Metoprolol today, but did not take his evening dose.  He does complain of a frontal headache that has been present for the past 2-3 days.  He reports that his headache is similar to headaches that he has had in the past.  Denies nausea, vomiting, or changes in vision.  He does have some weakness of the left upper and lower extremity from previous CVA, but no change in weakness.  His wife states that he had been taking Oxycodone for the pain in the past, but ran out of the medication one week ago.  The medication was prescribed by his Pain Management Physician.  He denies chest pain or SOB.  Denies visual changes.      The history is provided by the spouse and the patient.    Past Medical History  Diagnosis Date  . Hypertension   . Arthritis 04/04/2012  . Seizure 04/04/2012    POST CVA 12/2011  . RSD (reflex sympathetic dystrophy) 04/04/2012    LEFT HAND  . Dysphagia S/P CVA (cerebrovascular accident) 04/04/2012  . Aphasia 04/04/2012    HX OF POST cva  . Stroke 10/2010    LEFT mca cva ISCHEMIC  . 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 Chronic kidney disease   . Depression   . RSD (reflex sympathetic  dystrophy) 2013    LEFT HAND    Past Surgical History  Procedure Laterality Date  . No past surgeries      History reviewed. No pertinent family history.  History  Substance Use Topics  . Smoking status: Former Smoker -- 30 years    Types: Cigarettes    Quit date: 04/18/1991  . Smokeless tobacco: Never Used  . Alcohol Use: Yes     Comment: OCCASSIONAL      Review of Systems  Neurological: Positive for weakness and headaches.  All other systems reviewed and are negative.    Allergies  Review of patient's allergies indicates no known allergies.  Home Medications   Current Outpatient Rx  Name  Route  Sig  Dispense  Refill  . aspirin 325 MG tablet   Oral   Take 325 mg by mouth daily.         . citalopram (CELEXA) 20 MG tablet   Oral   Take 20 mg by mouth daily.         Marland Kitchen doxazosin (CARDURA) 8 MG tablet   Oral   Take 8 mg by mouth at bedtime.         . DULoxetine (CYMBALTA) 30 MG capsule   Oral   Take 30 mg by mouth daily.         Marland Kitchen  fluticasone (FLONASE) 50 MCG/ACT nasal spray   Nasal   Place 1 spray into the nose daily.         Marland Kitchen gabapentin (NEURONTIN) 100 MG capsule   Oral   Take 100 mg by mouth 3 (three) times daily.         Marland Kitchen levETIRAcetam (KEPPRA) 500 MG tablet   Oral   Take 500 mg by mouth every 12 (twelve) hours.         . metoprolol tartrate (LOPRESSOR) 25 MG tablet   Oral   Take 25 mg by mouth 2 (two) times daily.         . pantoprazole (PROTONIX) 20 MG tablet   Oral   Take 20 mg by mouth daily.         . traZODone (DESYREL) 50 MG tablet   Oral   Take 50 mg by mouth at bedtime.           BP 155/88  Pulse 61  Temp(Src) 97.5 F (36.4 C) (Oral)  SpO2 100%  Physical Exam  Nursing note and vitals reviewed. Constitutional: He appears well-developed and well-nourished. No distress.  HENT:  Head: Normocephalic and atraumatic.  Mouth/Throat: Oropharynx is clear and moist.  Eyes: EOM are normal. Pupils are equal,  round, and reactive to light.  Neck: Normal range of motion. Neck supple.  Cardiovascular: Normal rate, regular rhythm and normal heart sounds.   Pulmonary/Chest: Effort normal and breath sounds normal.  Abdominal: Soft. There is no tenderness.  Neurological: He is alert. No cranial nerve deficit.  Left sided weakness, which patient and wife report is baseline  Skin: He is not diaphoretic.    ED Course  Procedures (including critical care time)  Labs Reviewed - No data to display Ct Head Wo Contrast  09/01/2012   *RADIOLOGY REPORT*  Clinical Data: Headache.  Hypertension.  Previous stroke.  CT HEAD WITHOUT CONTRAST  Technique:  Contiguous axial images were obtained from the base of the skull through the vertex without contrast.  Comparison: 04/04/2012  Findings: There is no evidence of intracranial hemorrhage, brain edema or other signs of acute infarction.  There is no evidence of intracranial mass lesion or mass effect.  No abnormal extra-axial fluid collections are identified.  No evidence of hydrocephalus.  Extensive chronic small vessel disease again demonstrated as well as old bilateral basal ganglia and thalamic lacunar infarcts.  No skull abnormality identified.  IMPRESSION:  1.  No acute intracranial abnormality. 2.  Stable chronic small vessel disease and old bilateral lacunar infarcts.   Original Report Authenticated By: Myles Rosenthal, M.D.     No diagnosis found.    MDM  Patient with prior history of three CVA's, HTN, and CKD presents today due to an elevated blood pressure.  Patient denies chest pain.  EKG unchanged from prior.  No acute findings on Head CT.  Creatine appears to be at baseline.  Feel that patient is stable for discharge.          Pascal Lux [place], PA-C 09/02/12 1327

## 2012-09-02 LAB — CBC WITH DIFFERENTIAL/PLATELET
Eosinophils Absolute: 0.4 10*3/uL (ref 0.0–0.7)
Hemoglobin: 14 g/dL (ref 13.0–17.0)
Lymphocytes Relative: 47 % — ABNORMAL HIGH (ref 12–46)
Lymphs Abs: 1.9 10*3/uL (ref 0.7–4.0)
MCH: 29.5 pg (ref 26.0–34.0)
Monocytes Relative: 9 % (ref 3–12)
Neutrophils Relative %: 34 % — ABNORMAL LOW (ref 43–77)
RBC: 4.74 MIL/uL (ref 4.22–5.81)
WBC: 4.1 10*3/uL (ref 4.0–10.5)

## 2012-09-02 LAB — BASIC METABOLIC PANEL
BUN: 22 mg/dL (ref 6–23)
CO2: 27 mEq/L (ref 19–32)
Chloride: 105 mEq/L (ref 96–112)
Glucose, Bld: 94 mg/dL (ref 70–99)
Potassium: 3.8 mEq/L (ref 3.5–5.1)

## 2012-09-02 LAB — URINALYSIS, ROUTINE W REFLEX MICROSCOPIC
Bilirubin Urine: NEGATIVE
Glucose, UA: NEGATIVE mg/dL
Hgb urine dipstick: NEGATIVE
Specific Gravity, Urine: 1.017 (ref 1.005–1.030)
pH: 5 (ref 5.0–8.0)

## 2012-09-02 LAB — URINE MICROSCOPIC-ADD ON

## 2012-09-02 NOTE — ED Notes (Signed)
The patient is Collin Diaz and comfortable with the discharge instructions.  PTAR is driving him home.

## 2012-09-02 NOTE — ED Notes (Signed)
Case manager's phone number given to the patient's wife (per the request of Dr. Ignacia Palma) so arrangements can be made for physical therapy.

## 2012-09-02 NOTE — Progress Notes (Signed)
HOME HEALTH AGENCIES SERVING [place]   Agencies that are Medicare-Certified and are affiliated with [facility]  Telephone Number Address  [facility]   The [facility] has ownership interest in this company; however, you are under no obligation to use this agency. [phone] or  [phone] [address] [place], [place] [address]   Agencies that are Medicare-Certified and are not affiliated with [facility] Telephone Number Address  [facility] [phone] Fax [phone] [address], [address] [place], [place]  [address]  [facility] [phone] or [phone] Fax [phone] [address] [place], [place] [address]  [facility] [phone] Fax [phone] [address] [place], [place] [address]  [facility] [phone] Fax [phone] [address] [place], [place]  [address]  [facility] [phone] Fax [phone] [address] Suite [place], [place] [address]  [facility] [phone] [address] [place], [place] [address]  [facility] [phone]  [address] [place], [place] [address]  [facility] [phone] or [phone] Fax [phone], [address] [place], [place]  [address]  [facility] [phone] Fax [phone] [address] [place], [place]  [address]  [facility]  [phone] Fax [phone] [address] [place], [place] [address]               Agencies that are not Medicare-Certified and are not affiliated with [facility] Agency Telephone Number Address  [facility] [place] [phone] or [phone] Fax [phone] [address], [place]  [address]  [facility] [phone] Fax [phone] [address] [place], [place]  [address]  [facility]  [phone] Fax [phone] [address] [place], [place] [address]  [facility] [phone] Fax [phone] [address] [place], [place] [address]  [facility] [phone] or [phone] Fax [phone] [address] [place], [place]  [address]  [facility] [phone] or [phone] Fax [phone] [address], Suite [place], [place]  [address]  [facility] [phone] Fax [phone] [address] [place], [place]  [address]  [facility] [phone] [address] [place], [place]  [address]  [facility] [phone] Fax [phone] [address] [place], [place]  [address]  [facility] [phone] Fax [phone] [address] [place], [place]  [address]  [facility] [phone] Fax [phone] [address] [place], [place] [address]  [facility] [phone] Fax [phone] [address] [place], [place]  [address]   Spoke to wife on phone. Offered choice of agencies. [facility]. Appropriate referrals to be made.

## 2012-09-02 NOTE — Progress Notes (Signed)
   CARE MANAGEMENT ED NOTE 09/02/2012  Patient:  Collin Diaz, Collin Diaz   Account Number:  [id number]  Date Initiated:  09/02/2012  Documentation initiated by:  Fransico Michael  Subjective/Objective Assessment:   presented to ED with c/o hypertension     Subjective/Objective Assessment Detail:     Action/Plan:   home health needs   Action/Plan Detail:   Anticipated DC Date:  09/02/2012     Status Recommendation to Physician:   Result of Recommendation:      DC Planning Services  CM consult   [facility]  HOME HEALTH   Choice offered to / List presented to:  C-3 Spouse     HH arranged  HH-1 RN  HH-2 PT  HH-3 OT  HH-6 SOCIAL WORKER      HH agency  [facility]    Status of service:    ED Comments:   ED Comments Detail:  09/02/12-1410-J.Lenn Volker,RN,BSN [phone]       Debbie,RN with [facility] notified of choice for [facility]. No further needs identified.  09/02/12-1407-J.Catrena Vari,RN,BSN [phone]      Spoke with wife, Collin Diaz at [phone] to offer choiced of home health agencies. Reported having used [facility] in the past and would like to use them again. Appropriate referrals will be made.  09/02/12-1353-J.Kaizley Aja,RN,BSN [phone]      Dr. Ignacia Palma not working today. Spoke with Dr. Denton Lank regarding patient and needs for home health. Received verbal orders for home health RN,PT,OTand CSW. Orders entered by [facility].  09/02/12-1313-J.Bion Todorov,RN,BSN [phone]      Received call from patient's wife, Collin Diaz regarding patient's ED visit last night. States, "uh yes, I got your number from Dr. Ignacia Palma last night. He said you could arrange home health physical therapy for my husband. That's why I am calling you." Assured wife that [facility] would review the chart and get back to her this afternoon.

## 2012-09-02 NOTE — ED Notes (Signed)
[facility] contacted to take patient home  

## 2012-09-02 NOTE — ED Provider Notes (Signed)
12:42 AM  Date: 09/02/2012  Rate:57  Rhythm: normal sinus rhythm  QRS Axis: left  Intervals: normal  ST/T Wave abnormalities: normal  Conduction Disutrbances:left anterior fascicular block  Narrative Interpretation: Abnormal EKG  Old EKG Reviewed: unchanged    Carleene Cooper III, MD 09/02/12 [phone]

## 2012-09-03 NOTE — Telephone Encounter (Signed)
Spoke with patients wife.  She wanted him to have additional therapy.  She says [facility] is now coming to the house.

## 2012-09-03 NOTE — ED Provider Notes (Signed)
Medical screening examination/treatment/procedure(s) were conducted as a shared visit with non-physician practitioner(s) and myself.  I personally evaluated the patient during the encounter 57 yo man with prior history of stroke with left sided paralysis who has had worsening hypertension and headache. Exam shows him awake and alert. Left sided paralysis. BP appx 160/80 tonight. Lab workup and CT of head ordered. 12:30 A.M. Lab workup to this point is benign. Pt's wife wants him to have physical therapy evaluation, with the hope that he will regain the ability to walk. I advised her that we could not arrange that evaluation in the middle of the night, but that case managers could work that out for her in the morning.       Carleene Cooper III, MD 09/03/12 [phone]

## 2012-09-05 ENCOUNTER — Telehealth: Payer: Self-pay | Admitting: Physical Medicine & Rehabilitation

## 2012-09-13 ENCOUNTER — Ambulatory Visit: Payer: Medicaid Other | Admitting: Physical Medicine & Rehabilitation

## 2012-09-24 ENCOUNTER — Telehealth: Payer: Self-pay | Admitting: Physical Medicine & Rehabilitation

## 2012-09-24 ENCOUNTER — Inpatient Hospital Stay (HOSPITAL_COMMUNITY)
Admission: EM | Admit: 2012-09-24 | Discharge: 2012-09-26 | DRG: 392 | Disposition: A | Discharge status: Home / Self Care | Payer: Medicaid Other | Attending: Family Medicine | Admitting: Family Medicine

## 2012-09-24 ENCOUNTER — Encounter (HOSPITAL_COMMUNITY): Payer: Self-pay | Admitting: General Practice

## 2012-09-24 DIAGNOSIS — I69991 Dysphagia following unspecified cerebrovascular disease: Secondary | ICD-10-CM

## 2012-09-24 DIAGNOSIS — D696 Thrombocytopenia, unspecified: Secondary | ICD-10-CM | POA: Diagnosis present

## 2012-09-24 DIAGNOSIS — R1310 Dysphagia, unspecified: Secondary | ICD-10-CM | POA: Diagnosis present

## 2012-09-24 DIAGNOSIS — E860 Dehydration: Secondary | ICD-10-CM

## 2012-09-24 DIAGNOSIS — N185 Chronic kidney disease, stage 5: Secondary | ICD-10-CM | POA: Diagnosis present

## 2012-09-24 DIAGNOSIS — I6350 Cerebral infarction due to unspecified occlusion or stenosis of unspecified cerebral artery: Secondary | ICD-10-CM

## 2012-09-24 DIAGNOSIS — I69998 Other sequelae following unspecified cerebrovascular disease: Secondary | ICD-10-CM

## 2012-09-24 DIAGNOSIS — Z6830 Body mass index (BMI) 30.0-30.9, adult: Secondary | ICD-10-CM

## 2012-09-24 DIAGNOSIS — R569 Unspecified convulsions: Secondary | ICD-10-CM | POA: Diagnosis present

## 2012-09-24 DIAGNOSIS — R7989 Other specified abnormal findings of blood chemistry: Secondary | ICD-10-CM | POA: Diagnosis present

## 2012-09-24 DIAGNOSIS — R197 Diarrhea, unspecified: Principal | ICD-10-CM

## 2012-09-24 DIAGNOSIS — Z7982 Long term (current) use of aspirin: Secondary | ICD-10-CM

## 2012-09-24 DIAGNOSIS — M129 Arthropathy, unspecified: Secondary | ICD-10-CM | POA: Diagnosis present

## 2012-09-24 DIAGNOSIS — N189 Chronic kidney disease, unspecified: Secondary | ICD-10-CM

## 2012-09-24 DIAGNOSIS — G4733 Obstructive sleep apnea (adult) (pediatric): Secondary | ICD-10-CM | POA: Diagnosis present

## 2012-09-24 DIAGNOSIS — F329 Major depressive disorder, single episode, unspecified: Secondary | ICD-10-CM | POA: Diagnosis present

## 2012-09-24 DIAGNOSIS — I69959 Hemiplegia and hemiparesis following unspecified cerebrovascular disease affecting unspecified side: Secondary | ICD-10-CM

## 2012-09-24 DIAGNOSIS — F3289 Other specified depressive episodes: Secondary | ICD-10-CM | POA: Diagnosis present

## 2012-09-24 DIAGNOSIS — Z87891 Personal history of nicotine dependence: Secondary | ICD-10-CM

## 2012-09-24 DIAGNOSIS — G8190 Hemiplegia, unspecified affecting unspecified side: Secondary | ICD-10-CM | POA: Diagnosis present

## 2012-09-24 DIAGNOSIS — I120 Hypertensive chronic kidney disease with stage 5 chronic kidney disease or end stage renal disease: Secondary | ICD-10-CM | POA: Diagnosis present

## 2012-09-24 DIAGNOSIS — Z8673 Personal history of transient ischemic attack (TIA), and cerebral infarction without residual deficits: Secondary | ICD-10-CM

## 2012-09-24 DIAGNOSIS — R55 Syncope and collapse: Secondary | ICD-10-CM

## 2012-09-24 DIAGNOSIS — I639 Cerebral infarction, unspecified: Secondary | ICD-10-CM

## 2012-09-24 DIAGNOSIS — G90519 Complex regional pain syndrome I of unspecified upper limb: Secondary | ICD-10-CM | POA: Diagnosis present

## 2012-09-24 DIAGNOSIS — N179 Acute kidney failure, unspecified: Secondary | ICD-10-CM

## 2012-09-24 DIAGNOSIS — E669 Obesity, unspecified: Secondary | ICD-10-CM | POA: Diagnosis present

## 2012-09-24 DIAGNOSIS — R4701 Aphasia: Secondary | ICD-10-CM | POA: Diagnosis present

## 2012-09-24 LAB — POCT I-STAT TROPONIN I: Troponin i, poc: 0.01 ng/mL (ref 0.00–0.08)

## 2012-09-24 LAB — CBC
HCT: 36.5 % — ABNORMAL LOW (ref 39.0–52.0)
MCHC: 33.7 g/dL (ref 30.0–36.0)
MCV: 86.7 fL (ref 78.0–100.0)
RDW: 14.7 % (ref 11.5–15.5)

## 2012-09-24 LAB — COMPREHENSIVE METABOLIC PANEL
Alkaline Phosphatase: 130 U/L — ABNORMAL HIGH (ref 39–117)
BUN: 43 mg/dL — ABNORMAL HIGH (ref 6–23)
Calcium: 9.4 mg/dL (ref 8.4–10.5)
Creatinine, Ser: 3.09 mg/dL — ABNORMAL HIGH (ref 0.50–1.35)
GFR calc Af Amer: 24 mL/min — ABNORMAL LOW (ref >90)
Glucose, Bld: 82 mg/dL (ref 70–99)
Total Protein: 6.5 g/dL (ref 6.0–8.3)

## 2012-09-24 LAB — POCT I-STAT, CHEM 8
BUN: 42 mg/dL — ABNORMAL HIGH (ref 6–23)
Chloride: 113 mEq/L — ABNORMAL HIGH (ref 96–112)
Potassium: 4.5 mEq/L (ref 3.5–5.1)
Sodium: 142 mEq/L (ref 135–145)
TCO2: 21 mmol/L (ref 0–100)

## 2012-09-24 LAB — CLOSTRIDIUM DIFFICILE BY PCR: Toxigenic C. Difficile by PCR: NEGATIVE

## 2012-09-24 LAB — URINALYSIS, ROUTINE W REFLEX MICROSCOPIC
Bilirubin Urine: NEGATIVE
Hgb urine dipstick: NEGATIVE
Protein, ur: NEGATIVE mg/dL
Urobilinogen, UA: 0.2 mg/dL (ref 0.0–1.0)

## 2012-09-24 LAB — TROPONIN I
Troponin I: <0.3 ng/mL (ref <0.30)
Troponin I: <0.3 ng/mL (ref <0.30)

## 2012-09-24 MED ORDER — SODIUM CHLORIDE 0.9 % IV SOLN
Freq: Once | INTRAVENOUS | Status: AC
  Administered 2012-09-24: 06:00:00 via INTRAVENOUS

## 2012-09-24 MED ORDER — SODIUM CHLORIDE 0.9 % IV BOLUS (SEPSIS)
1000.0000 mL | Freq: Once | INTRAVENOUS | Status: AC
  Administered 2012-09-24: 1000 mL via INTRAVENOUS

## 2012-09-24 MED ORDER — ASPIRIN 325 MG PO TABS
325.0000 mg | ORAL_TABLET | Freq: Every day | ORAL | Status: DC
  Administered 2012-09-24 – 2012-09-26 (×3): 325 mg via ORAL
  Filled 2012-09-24 (×3): qty 1

## 2012-09-24 MED ORDER — CITALOPRAM HYDROBROMIDE 20 MG PO TABS
20.0000 mg | ORAL_TABLET | Freq: Every day | ORAL | Status: DC
  Administered 2012-09-24 – 2012-09-26 (×3): 20 mg via ORAL
  Filled 2012-09-24 (×3): qty 1

## 2012-09-24 MED ORDER — PANTOPRAZOLE SODIUM 20 MG PO TBEC
20.0000 mg | DELAYED_RELEASE_TABLET | Freq: Every day | ORAL | Status: DC
  Administered 2012-09-24 – 2012-09-26 (×3): 20 mg via ORAL
  Filled 2012-09-24 (×3): qty 1

## 2012-09-24 MED ORDER — GABAPENTIN 100 MG PO CAPS
100.0000 mg | ORAL_CAPSULE | Freq: Three times a day (TID) | ORAL | Status: DC
  Administered 2012-09-24 – 2012-09-26 (×7): 100 mg via ORAL
  Filled 2012-09-24 (×10): qty 1

## 2012-09-24 MED ORDER — DOXAZOSIN MESYLATE 8 MG PO TABS
8.0000 mg | ORAL_TABLET | Freq: Every day | ORAL | Status: DC
  Administered 2012-09-24 – 2012-09-25 (×2): 8 mg via ORAL
  Filled 2012-09-24 (×4): qty 1

## 2012-09-24 MED ORDER — HEPARIN SODIUM (PORCINE) 5000 UNIT/ML IJ SOLN
5000.0000 [IU] | Freq: Three times a day (TID) | INTRAMUSCULAR | Status: DC
  Administered 2012-09-24 – 2012-09-26 (×6): 5000 [IU] via SUBCUTANEOUS
  Filled 2012-09-24 (×11): qty 1

## 2012-09-24 MED ORDER — SODIUM CHLORIDE 0.9 % IJ SOLN
3.0000 mL | Freq: Two times a day (BID) | INTRAMUSCULAR | Status: DC
  Administered 2012-09-24 – 2012-09-25 (×2): 3 mL via INTRAVENOUS

## 2012-09-24 MED ORDER — SODIUM CHLORIDE 0.9 % IV SOLN
INTRAVENOUS | Status: DC
  Administered 2012-09-24 – 2012-09-26 (×2): via INTRAVENOUS

## 2012-09-24 MED ORDER — FLUTICASONE PROPIONATE 50 MCG/ACT NA SUSP
1.0000 | Freq: Every day | NASAL | Status: DC
  Administered 2012-09-24 – 2012-09-25 (×2): 1 via NASAL
  Filled 2012-09-24: qty 16

## 2012-09-24 MED ORDER — LEVETIRACETAM 500 MG PO TABS
500.0000 mg | ORAL_TABLET | Freq: Two times a day (BID) | ORAL | Status: DC
  Administered 2012-09-24 – 2012-09-26 (×5): 500 mg via ORAL
  Filled 2012-09-24 (×7): qty 1

## 2012-09-24 MED ORDER — DULOXETINE HCL 30 MG PO CPEP
30.0000 mg | ORAL_CAPSULE | Freq: Every day | ORAL | Status: DC
  Administered 2012-09-24 – 2012-09-26 (×3): 30 mg via ORAL
  Filled 2012-09-24 (×3): qty 1

## 2012-09-24 MED ORDER — METOPROLOL TARTRATE 25 MG PO TABS
25.0000 mg | ORAL_TABLET | Freq: Two times a day (BID) | ORAL | Status: DC
  Administered 2012-09-24 – 2012-09-26 (×5): 25 mg via ORAL
  Filled 2012-09-24 (×7): qty 1

## 2012-09-24 MED ORDER — TRAZODONE HCL 50 MG PO TABS
50.0000 mg | ORAL_TABLET | Freq: Every day | ORAL | Status: DC
  Administered 2012-09-24 – 2012-09-25 (×2): 50 mg via ORAL
  Filled 2012-09-24 (×4): qty 1

## 2012-09-24 NOTE — H&P (Signed)
[facility] History and Physical  WYNTER GRAVE ZOX:[id number] DOB: Dec 22, 1955    PCP:   Marletta Lor, NP   Chief Complaint: syncope  HPI: Collin Diaz is an 57 y.o. 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  He did not have any seizure activities, CP or shortness of breath.  Wife stated he has had diarrhea for three days.  There has been no recent antibiotic use, distant travel or any ill contacts.  Evaluation in the ER included a Cr of 3.1 with baseline about 1.8, WBC of 3.2K with Hb 12.3.  He has elevated LFTs with AST of 49, with ALT of 130 and alk Phos of 130.  There has been no abdominal pain, nausea or vomiting.  Hospitalist was asked to admit him for ARF likely from dehydration likely from diarrhea of unclear cause.  Rewiew of Systems:  Constitutional: Negative for malaise, fever and chills. No significant weight loss or weight gain Eyes: Negative for eye pain, redness and discharge, diplopia, visual changes, or flashes of light. ENMT: Negative for ear pain, hoarseness, nasal congestion, sinus pressure and sore throat. No headaches; tinnitus, drooling, or problem swallowing. Cardiovascular: Negative for chest pain, palpitations, diaphoresis, dyspnea and peripheral edema. ; No orthopnea, PND Respiratory: Negative for cough, hemoptysis, wheezing and stridor. No pleuritic chestpain. Gastrointestinal: Negative for nausea, vomiting, constipation, abdominal pain, melena, blood in stool, hematemesis, jaundice and rectal bleeding.    Genitourinary: Negative for frequency, dysuria, incontinence,flank pain and hematuria; Musculoskeletal: Negative for back pain and neck pain. Negative for swelling and trauma.;  Skin: . Negative for pruritus, rash, abrasions, bruising and skin lesion.; ulcerations Neuro: Negative for headache, lightheadedness and  neck stiffness. Negative for weakness, altered level of consciousness , altered mental status, extremity weakness, burning feet, involuntary movement, seizure and syncope.  Psych: negative for anxiety, depression, insomnia, tearfulness, panic attacks, hallucinations, paranoia, suicidal or homicidal ideation    Past Medical History  Diagnosis Date  . Hypertension   . Arthritis 04/04/2012  . Seizure 04/04/2012    POST CVA 12/2011  . RSD (reflex sympathetic dystrophy) 04/04/2012    LEFT HAND  . Dysphagia S/P CVA (cerebrovascular accident) 04/04/2012  . Aphasia 04/04/2012    HX OF POST cva  . Stroke 10/2010    LEFT mca cva ISCHEMIC  . 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 Chronic kidney disease   . Depression   . RSD (reflex sympathetic dystrophy) 2013    LEFT HAND    Past Surgical History  Procedure Laterality Date  . No past surgeries      Medications:  HOME MEDS: Prior to Admission medications   Medication Sig Start Date End Date Taking? Authorizing Provider  aspirin 325 MG tablet Take 325 mg by mouth daily.   Yes Historical Provider, MD  citalopram (CELEXA) 20 MG tablet Take 20 mg by mouth daily.   Yes Historical Provider, MD  doxazosin (CARDURA) 8 MG tablet Take 8 mg by mouth at bedtime.   Yes Historical Provider, MD  DULoxetine (CYMBALTA) 30 MG capsule Take 30 mg by mouth daily.   Yes Historical Provider, MD  fluticasone (FLONASE) 50 MCG/ACT nasal spray Place 1 spray into the nose daily.   Yes Historical Provider, MD  gabapentin (NEURONTIN) 100 MG capsule Take 100 mg by mouth 3 (three) times daily.  Yes Historical Provider, MD  levETIRAcetam (KEPPRA) 500 MG tablet Take 500 mg by mouth every 12 (twelve) hours.   Yes Historical Provider, MD  metoprolol tartrate (LOPRESSOR) 25 MG tablet Take 25 mg by mouth 2 (two) times daily.   Yes Historical Provider, MD  oxyCODONE-acetaminophen (PERCOCET/ROXICET) 5-325 MG per tablet Take 1 tablet by mouth every  4 (four) hours as needed for pain.   Yes Historical Provider, MD  pantoprazole (PROTONIX) 20 MG tablet Take 20 mg by mouth daily.   Yes Historical Provider, MD  traZODone (DESYREL) 50 MG tablet Take 50 mg by mouth at bedtime.   Yes Historical Provider, MD     Allergies:  No Known Allergies  Social History:   reports that he quit smoking about 21 years ago. His smoking use included Cigarettes. He smoked 0.00 packs per day for 30 years. He has never used smokeless tobacco. He reports that  drinks alcohol. He reports that he does not use illicit drugs.  Family History: No family history on file.   Physical Exam: Filed Vitals:   09/24/12 0245 09/24/12 0300 09/24/12 0314 09/24/12 0330  BP: 96/69 90/65 90/65  106/76  Pulse: 71 70 71 72  Temp:      TempSrc:      Resp: 15 14 18 13   SpO2: 95% 95% 96% 95%   Blood pressure 106/76, pulse 72, temperature 98 F (36.7 C), temperature source Oral, resp. rate 13, SpO2 95.00%.  GEN:  Pleasant patient lying in the stretcher in no acute distress; cooperative with exam. PSYCH:  alert and oriented x4; does not appear anxious or depressed; affect is appropriate. HEENT: Mucous membranes pink and anicteric; PERRLA; EOM intact; no cervical lymphadenopathy nor thyromegaly or carotid bruit; no JVD; There were no stridor. Neck is very supple. Breasts:: Not examined CHEST WALL: No tenderness CHEST: Normal respiration, clear to auscultation bilaterally.  HEART: Regular rate and rhythm.  There are no murmur, rub, or gallops.   BACK: No kyphosis or scoliosis; no CVA tenderness ABDOMEN: soft and non-tender; no masses, no organomegaly, normal abdominal bowel sounds; no pannus; no intertriginous candida. There is no rebound and no distention. Rectal Exam: Not done EXTREMITIES: No bone or joint deformity; age-appropriate arthropathy of the hands and knees; no edema; no ulcerations.  There is no calf tenderness. Genitalia: not examined PULSES: 2+ and  symmetric SKIN: Normal hydration no rash or ulceration CNS: Cranial nerves 2-12 grossly intact no focal lateralizing neurologic deficit.  Speech is fluent; uvula elevated with phonation, facial symmetry and tongue midline. Left sided paralysis.  Labs on Admission:  Basic Metabolic Panel:  Recent Labs Lab 09/24/12 0215 09/24/12 0225  NA 138 142  K 4.3 4.5  CL 108 113*  CO2 21  --   GLUCOSE 82 80  BUN 43* 42*  CREATININE 3.09* 3.10*  CALCIUM 9.4  --    Liver Function Tests:  Recent Labs Lab 09/24/12 0215  AST 49*  ALT 131*  ALKPHOS 130*  BILITOT 0.4  PROT 6.5  ALBUMIN 3.6   No results found for this basename: LIPASE, AMYLASE,  in the last 168 hours No results found for this basename: AMMONIA,  in the last 168 hours CBC:  Recent Labs Lab 09/24/12 0215 09/24/12 0225  WBC 3.2*  --   HGB 12.3* 12.9*  HCT 36.5* 38.0*  MCV 86.7  --   PLT 82*  --    Cardiac Enzymes: No results found for this basename: CKTOTAL, CKMB, CKMBINDEX, TROPONINI,  in the  last 168 hours  CBG: No results found for this basename: GLUCAP,  in the last 168 hours   Radiological Exams on Admission: No results found.  Assessment/Plan Present on Admission:  . KIDNEY DISEASE, CHRONIC, STAGE V . Acute-on-chronic renal failure . Hemiplegia, unspecified, affecting dominant side . Obesity (BMI 30.0-34.9) . OSA (obstructive sleep apnea) . Syncope  PLAN:  This gentleman has syncope likely because of volume depletion from diarrhea causing ARF with baseline CDK V.  He will need to have volume repletion, and to have stool studies for his diarrhea.  I will send for stool C and S, and C diff.  He is stable, full code, and will be admitted to [facility] service.  Other plans as per orders.  Code Status: FULL Unk Lightning, MD. [facility] Pager [phone] 7pm to 7am.  09/24/2012, 3:53 AM

## 2012-09-24 NOTE — ED Notes (Signed)
Urinal placed for patient to try to urinate for sample

## 2012-09-24 NOTE — ED Notes (Signed)
Near syncope while on toilet. Pos. Loc. Hx. Of stroke. Lt. Side of body compromised. Trouble speaking - but can understand.

## 2012-09-24 NOTE — ED Notes (Signed)
Left upper and lower extremities flaccid; weak upper and lower rt. Extreme ties.

## 2012-09-24 NOTE — ED Provider Notes (Signed)
History     CSN: [id number]  Arrival date & time 09/24/12  0117   First MD Initiated Contact with Patient 09/24/12 0141      Chief Complaint  Patient presents with  . Near Syncope    (Consider location/radiation/quality/duration/timing/severity/associated sxs/prior treatment) HPI 57 year old male presents to emergency room via EMS from home with report of altered mental status./Syncope.  Wife reports patient has had loose stools for the last 3-4 days.  He has had decreased appetite.  She reports she is only able to get him to eating a spoon full of chicken noodle soup at a time.  No fevers no chills.  Occasionally complains of crampy abdominal pain.  Tonight patient was placed on the commode by his wife, and she reports that he became unresponsive.  She thinks that he was unresponsive for about 20 minutes.  She has noticed over the last few days his blood pressures have been lower than usual.  Patient with past history of hypertension, CVA, x2, chronic kidney disease.  Patient had brief period of chest pain while in the ambulance, but none since. Past Medical History  Diagnosis Date  . Hypertension   . Arthritis 04/04/2012  . Seizure 04/04/2012    POST CVA 12/2011  . RSD (reflex sympathetic dystrophy) 04/04/2012    LEFT HAND  . Dysphagia S/P CVA (cerebrovascular accident) 04/04/2012  . Aphasia 04/04/2012    HX OF POST cva  . Stroke 10/2010    LEFT mca cva ISCHEMIC  . 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 Chronic kidney disease   . Depression   . RSD (reflex sympathetic dystrophy) 2013    LEFT HAND    Past Surgical History  Procedure Laterality Date  . No past surgeries      No family history on file.  History  Substance Use Topics  . Smoking status: Former Smoker -- 30 years    Types: Cigarettes    Quit date: 04/18/1991  . Smokeless tobacco: Never Used  . Alcohol Use: Yes     Comment: OCCASSIONAL      Review of Systems  Unable to  perform ROS: Other   is slow to answer questions, and does not know the answer to many review of system questions  Allergies  Review of patient's allergies indicates no known allergies.  Home Medications   Current Outpatient Rx  Name  Route  Sig  Dispense  Refill  . aspirin 325 MG tablet   Oral   Take 325 mg by mouth daily.         . citalopram (CELEXA) 20 MG tablet   Oral   Take 20 mg by mouth daily.         Marland Kitchen doxazosin (CARDURA) 8 MG tablet   Oral   Take 8 mg by mouth at bedtime.         . DULoxetine (CYMBALTA) 30 MG capsule   Oral   Take 30 mg by mouth daily.         . fluticasone (FLONASE) 50 MCG/ACT nasal spray   Nasal   Place 1 spray into the nose daily.         Marland Kitchen gabapentin (NEURONTIN) 100 MG capsule   Oral   Take 100 mg by mouth 3 (three) times daily.         Marland Kitchen levETIRAcetam (KEPPRA) 500 MG tablet   Oral   Take 500 mg by mouth every 12 (twelve) hours.         Marland Kitchen  metoprolol tartrate (LOPRESSOR) 25 MG tablet   Oral   Take 25 mg by mouth 2 (two) times daily.         Marland Kitchen oxyCODONE-acetaminophen (PERCOCET/ROXICET) 5-325 MG per tablet   Oral   Take 1 tablet by mouth every 4 (four) hours as needed for pain.         . pantoprazole (PROTONIX) 20 MG tablet   Oral   Take 20 mg by mouth daily.         . traZODone (DESYREL) 50 MG tablet   Oral   Take 50 mg by mouth at bedtime.           BP 90/65  Pulse 71  Temp(Src) 98 F (36.7 C) (Oral)  Resp 18  SpO2 96%  Physical Exam  Nursing note and vitals reviewed. Constitutional: He is oriented to person, place, and time. He appears well-developed and well-nourished.  HENT:  Head: Normocephalic and atraumatic.  Nose: Nose normal.  Dry mucous membranes  Eyes: Conjunctivae and EOM are normal. Pupils are equal, round, and reactive to light.  Neck: Normal range of motion. Neck supple. No JVD present. No tracheal deviation present. No thyromegaly present.  Cardiovascular: Normal rate, regular  rhythm, normal heart sounds and intact distal pulses.  Exam reveals no gallop and no friction rub.   No murmur heard. Pulmonary/Chest: Effort normal and breath sounds normal. No stridor. No respiratory distress. He has no wheezes. He has no rales. He exhibits no tenderness.  Abdominal: Soft. He exhibits no distension and no mass. There is no tenderness. There is no rebound and no guarding.  Hyperactive bowel sounds  Musculoskeletal: He exhibits no edema and no tenderness.  Left-sided paralysis with contractures noted to upper [place]  Lymphadenopathy:    He has no cervical adenopathy.  Neurological: He is alert and oriented to person, place, and time. No cranial nerve deficit. He exhibits abnormal muscle tone (contractures to left upper extremity). Coordination (left-sided paralysis) abnormal.  Skin: Skin is warm and dry. No rash noted. No erythema. No pallor.  Psychiatric: He has a normal mood and affect. His behavior is normal.    ED Course  Procedures (including critical care time)  Labs Reviewed  CBC - Abnormal; Notable for the following:    WBC 3.2 (*)    RBC 4.21 (*)    Hemoglobin 12.3 (*)    HCT 36.5 (*)    Platelets 82 (*)    All other components within normal limits  COMPREHENSIVE METABOLIC PANEL - Abnormal; Notable for the following:    BUN 43 (*)    Creatinine, Ser 3.09 (*)    AST 49 (*)    ALT 131 (*)    Alkaline Phosphatase 130 (*)    GFR calc non Af Amer 21 (*)    GFR calc Af Amer 24 (*)    All other components within normal limits  POCT I-STAT, CHEM 8 - Abnormal; Notable for the following:    Chloride 113 (*)    BUN 42 (*)    Creatinine, Ser 3.10 (*)    Calcium, Ion 1.29 (*)    Hemoglobin 12.9 (*)    HCT 38.0 (*)    All other components within normal limits  URINALYSIS, ROUTINE W REFLEX MICROSCOPIC  POCT I-STAT TROPONIN I   No results found.   Date: 09/24/2012  Rate: 73  Rhythm: normal sinus rhythm  QRS Axis: normal  Intervals: normal  ST/T Wave  abnormalities: normal  Conduction Disutrbances:left anterior fascicular  block  Narrative Interpretation:   Old EKG Reviewed: unchanged    1. Syncope   2. Dehydration   3. Acute renal failure   4. Diarrhea       MDM  This [age]-year-old male with syncopal event.  Tonight, after 3-4 days of diarrheal illness.  Noted to have acute renal failure on top of his chronic renal insufficiency.  IV fluids, started.  I think this is most likely prerenal in nature.  Discussed with hospitalist for admission        Olivia Mackie, MD 09/24/12 [phone]

## 2012-09-24 NOTE — ED Notes (Signed)
PT states diarrhea for one week with decreased urination.  Abdomen soft with bowel sounds present.  No pain.  Pt is flaccid on the left side from previous stroke.  SR 71 on monitor

## 2012-09-24 NOTE — ED Notes (Signed)
Family at bedside. 

## 2012-09-24 NOTE — Progress Notes (Signed)
Patient admitted earlier today for a syncopal event following 3 days of profuse diarrhea. C Diff PCR ordered. Also noted to have acute on CKD, likely prerenal azotemia from GI losses. Continue IVF. Recheck renal function in am. Will add PT/OT evals. Will continue to follow.  Peggye Pitt, MD [facility] Pager: [phone]

## 2012-09-24 NOTE — ED Notes (Signed)
Per report speech deficit, stroke residuals, and has to have thicken liquids for medications

## 2012-09-24 NOTE — Progress Notes (Addendum)
SLP Cancellation Note  Patient Details Name: Collin Diaz MRN: [id number] DOB: [birth year]   Cancelled treatment:       Reason Eval/Treat Not Completed: Other (comment) (on schedule for tomorrow).  Received order for MBS while inpatient. Pt had planned for MBS this week as an outpatient but current admission caused cancellation. Will plan to complete MBS tomorrow in the am. Wife can attend study if desired. Also, pt consumes thickened liquids at baseline. SLP will modify current diet to nectar thick liquids.   Harlon Ditty, [place] CCC-SLP [phone]  Claudine Mouton 09/24/2012, 11:47 AM

## 2012-09-24 NOTE — Telephone Encounter (Signed)
FYI:  Patient is in the hospital on 3 west.

## 2012-09-24 NOTE — ED Notes (Signed)
Pt. Alert.Marland Kitchenaccording to wife,"pt. Having bouts of diarrhea, failure to thrive, and not eating." she keeps a log of blood pressures: norm bp 160/80 and currently is 90's 117/760's.

## 2012-09-25 ENCOUNTER — Inpatient Hospital Stay (HOSPITAL_COMMUNITY): Payer: Medicaid Other

## 2012-09-25 LAB — CBC
HCT: 34.3 % — ABNORMAL LOW (ref 39.0–52.0)
HCT: 37.4 % — ABNORMAL LOW (ref 39.0–52.0)
Hemoglobin: 11.8 g/dL — ABNORMAL LOW (ref 13.0–17.0)
MCH: 29.5 pg (ref 26.0–34.0)
MCHC: 34.4 g/dL (ref 30.0–36.0)
MCHC: 34.8 g/dL (ref 30.0–36.0)
MCV: 85.8 fL (ref 78.0–100.0)
RDW: 14.7 % (ref 11.5–15.5)

## 2012-09-25 LAB — BASIC METABOLIC PANEL
BUN: 33 mg/dL — ABNORMAL HIGH (ref 6–23)
BUN: 28 mg/dL — ABNORMAL HIGH (ref 6–23)
Chloride: 113 mEq/L — ABNORMAL HIGH (ref 96–112)
Creatinine, Ser: 1.89 mg/dL — ABNORMAL HIGH (ref 0.50–1.35)
GFR calc Af Amer: 44 mL/min — ABNORMAL LOW (ref >90)
GFR calc non Af Amer: 32 mL/min — ABNORMAL LOW (ref >90)
GFR calc non Af Amer: 38 mL/min — ABNORMAL LOW (ref >90)
Glucose, Bld: 80 mg/dL (ref 70–99)
Potassium: 3.7 mEq/L (ref 3.5–5.1)

## 2012-09-25 NOTE — Progress Notes (Signed)
Physical Therapy Evaluation Patient Details Name: Collin Diaz MRN: [id number] DOB: Oct 04, 1955 Today's Date: 09/25/2012 Time: 4782-9562 PT Time Calculation (min): 14 min  PT Assessment / Plan / Recommendation Clinical Impression  Pt s/p syncope with dehydration, diarrhea and renal failure.  Has previous CVA which affected his ability to move the right side in which pt has been cared for by wife and states he needs min to mod assist for transfers.  Will benefit from PT to address balance and endurance issues.  May need SNF with therapy to return to prior functional status.      PT Assessment  Patient needs continued PT services    Follow Up Recommendations  SNF;Supervision/Assistance - 24 hour                Equipment Recommendations  None recommended by PT         Frequency Min 3X/week    Precautions / Restrictions Precautions Precautions: Fall Restrictions Weight Bearing Restrictions: No   Pertinent Vitals/Pain VSS, No pain      Mobility  Bed Mobility Bed Mobility: Supine to Sit;Sitting - Scoot to Edge of Bed Supine to Sit: 2: Max assist Sitting - Scoot to [place] of Bed: 2: Max assist Transfers Sit to Stand: 1: +2 Total assist;From bed Sit to Stand: Patient Percentage: 50% Stand to Sit: 1: +2 Total assist;To chair/3-in-1 Stand to Sit: Patient Percentage: 50% Details for Transfer Assistance: Had to flex patient forward to be able to perform the transfer.  Pt has tendency to lean back.  Needed to facilitate forward flexion with mod to max assist.  Pt with flexed posture in standing, pt typically does a sliding board transfer  Ambulation/Gait Ambulation/Gait Assistance: Not tested (comment) Stairs: No Wheelchair Mobility Wheelchair Mobility: No         PT Diagnosis: Generalized weakness  PT Problem List: Decreased activity tolerance;Decreased strength;Decreased balance;Decreased mobility;Decreased knowledge of use of DME;Decreased safety awareness;Decreased  knowledge of precautions PT Treatment Interventions: DME instruction;Functional mobility training;Therapeutic activities;Therapeutic exercise;Balance training;Neuromuscular re-education;Patient/family education   PT Goals Acute Rehab PT Goals PT Goal Formulation: With patient Time For Goal Achievement: 10/09/12 Potential to Achieve Goals: Fair Pt will go Supine/Side to Sit: with min assist PT Goal: Supine/Side to Sit - Progress: Goal set today Pt will Sit at Edge of Bed: with supervision;3-5 min;with unilateral upper extremity support PT Goal: Sit at Edge Of Bed - Progress: Goal set today Pt will go Sit to Stand: with min assist;with upper extremity assist PT Goal: Sit to Stand - Progress: Goal set today Pt will Transfer Bed to Chair/Chair to Bed: with min assist PT Transfer Goal: Bed to Chair/Chair to Bed - Progress: Goal set today  Visit Information  Last PT Received On: 09/25/12 Assistance Needed: +2 PT/OT Co-Evaluation/Treatment: Yes    Subjective Data  Subjective: "I feel weak." Patient Stated Goal: to go home   Prior Functioning  Home Living Lives With: Spouse (brother in law) Available Help at Discharge: Family;Available 24 hours/day Type of Home: Apartment Home Access: Ramped entrance Home Layout: One level Bathroom Shower/Tub: Engineer, manufacturing systems: Standard Bathroom Accessibility: No Home Adaptive Equipment: Wheelchair - manual;Hospital bed (drop arm commode, transfer board) Prior Function Level of Independence: Needs assistance Needs Assistance: Bathing;Dressing;Toileting;Transfers;Meal Prep;Light Housekeeping;Feeding;Grooming Bath: Moderate Dressing: Moderate Feeding: Minimal Grooming: Minimal Toileting: Total Meal Prep: Total Light Housekeeping: Total Transfer Assistance: +1 with transfer board Able to Take Stairs?: No Driving: No Communication Communication: No difficulties Dominant Hand: Right    Cognition  Cognition Arousal/Alertness:  Awake/alert Behavior During Therapy: WFL for tasks assessed/performed Overall Cognitive Status: Within Functional Limits for tasks assessed    Extremity/Trunk Assessment Right Upper Extremity Assessment RUE ROM/Strength/Tone: WFL for tasks assessed Left Upper Extremity Assessment LUE ROM/Strength/Tone: Deficits LUE ROM/Strength/Tone Deficits: longstanding hemiplegia with flexor tone, no functional use, has a hand splint Right Lower Extremity Assessment RLE ROM/Strength/Tone: Deficits RLE ROM/Strength/Tone Deficits: grossly 3-/5 Left Lower Extremity Assessment LLE ROM/Strength/Tone: Deficits LLE ROM/Strength/Tone Deficits: grossly 2/5 Trunk Assessment Trunk Assessment: Kyphotic   Balance Balance Balance Assessed: Yes Static Sitting Balance Static Sitting - Balance Support: Feet supported;Right upper extremity supported Static Sitting - Level of Assistance: 3: Mod assist;2: Max assist (pt with posterior and R side lean)  End of Session PT - End of Session Equipment Utilized During Treatment: Gait belt Activity Tolerance: Patient limited by fatigue Patient left: in chair;with call bell/phone within reach Nurse Communication: Mobility status;Need for lift equipment       INGOLD,Jimena Wieczorek 09/25/2012, 12:08 PM [facility] Acute Rehabilitation [phone] (pager)

## 2012-09-25 NOTE — Procedures (Signed)
Objective Swallowing Evaluation: Modified Barium Swallowing Study  Patient Details  Name: Collin Diaz MRN: [id number] Date of Birth: 08-22-1955  Today's Date: 09/25/2012 Time: 4782-9562 SLP Time Calculation (min): 28 min  Past Medical History:  Past Medical History  Diagnosis Date  . Hypertension   . Arthritis 04/04/2012  . Seizure 04/04/2012    POST CVA 12/2011  . RSD (reflex sympathetic dystrophy) 04/04/2012    LEFT HAND  . Dysphagia S/P CVA (cerebrovascular accident) 04/04/2012  . Aphasia 04/04/2012    HX OF POST cva  . Stroke 10/2010    LEFT mca cva ISCHEMIC  . 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 Chronic kidney disease   . Depression   . RSD (reflex sympathetic dystrophy) 2013    LEFT HAND   Past Surgical History:  Past Surgical History  Procedure Laterality Date  . No past surgeries     HPI:  Collin Diaz is an 57 y.o. 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 He did not have any seizure activities, CP or shortness of breath. Wife stated he has had diarrhea for three days.  Following CVA's, pt with resulting dysphgia, has been seen by SLP service multiple time sin the past, recommended to consume Dys3/nectar at last MBS in 1/14. Prior to admit, pt scheduled for repeat OP MBS on 09/26/12 at [facility] to determine readiness for upgrade.  Per the wife, pt continues to drink nectar thick liquids at home; pt reports he drinks thin liquids. Given new admission, pt recommended to complete MBS while an inpatient.      Assessment / Plan / Recommendation Clinical Impression  Dysphagia Diagnosis: Moderate pharyngeal phase dysphagia Clinical impression: Pt demonstrates little to no functional change since last MBS. Pt continues to silently aspirate thin liquids in most trials due to a delay in swallow  initiation and decreased laryngeal closure and lack of sensation. SLP attempted chin tuck and head turns with no success in decreasing frequency or severity of aspiration.  Cues cough/thrat clear does not expel aspirate. The only effective method to decrease aspiration is limiting bolus size to small teaspoon amounts or by thickening to nectar. For now, recommend pt consume a dys 3 (mech soft) diet with nectar thick liquids. Will discuss options with wife, though defer also to treating SLP.     Treatment Recommendation  Therapy as outlined in treatment plan below    Diet Recommendation Dysphagia 3 (Mechanical Soft);Nectar-thick liquid   Liquid Administration via: Cup Medication Administration: Whole meds with puree Supervision: Patient able to self feed;Intermittent supervision to cue for compensatory strategies Compensations: Slow rate;Small sips/bites Postural Changes and/or Swallow Maneuvers: Seated upright 90 degrees    Other  Recommendations Oral Care Recommendations: Oral care BID Other Recommendations: Order thickener from pharmacy   Follow Up Recommendations  Home health SLP    Frequency and Duration min 2x/week  1 week   Pertinent Vitals/Pain NA    SLP Swallow Goals Patient will utilize recommended strategies during swallow to increase swallowing safety with: Minimal cueing Swallow Study Goal #2 - Progress: Progressing toward goal   General HPI: Collin Diaz is an 57 y.o. 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  He did not have any seizure activities, CP or shortness of breath. Wife stated he has had diarrhea for three days.  Following CVA's, pt with resulting dysphgia, has been seen by SLP service multiple time sin the past, recommended to consume Dys3/nectar at last MBS in 1/14. Prior to admit, pt scheduled for repeat OP MBS on 09/26/12  at [facility] to determine readiness for upgrade.  Per the wife, pt continues to drink nectar thick liquids at home; pt reports he drinks thin liquids. Given new admission, pt recommended to complete MBS while an inpatient.  Type of Study: Modified Barium Swallowing Study Reason for Referral: Objectively evaluate swallowing function Previous Swallow Assessment: several, see HPI Diet Prior to this Study: Dysphagia 3 (soft);Nectar-thick liquids Temperature Spikes Noted: No Respiratory Status: Room air History of Recent Intubation: No Behavior/Cognition: Alert;Cooperative Oral Cavity - Dentition: Adequate natural dentition Oral Motor / Sensory Function: Within functional limits Self-Feeding Abilities: Able to feed self Patient Positioning: Upright in chair Baseline Vocal Quality: Clear;Low vocal intensity Volitional Cough: Weak Volitional Swallow: Able to elicit Anatomy: Within functional limits Pharyngeal Secretions: Not observed secondary MBS    Reason for Referral Objectively evaluate swallowing function   Oral Phase Oral Preparation/Oral Phase Oral Phase: Impaired Oral - Nectar Oral - Nectar Cup: Other (Comment) (posterior, premature loss of bolus) Oral - Thin Oral - Thin Teaspoon: Other (Comment) (posterior, premature loss of bolus) Oral - Thin Cup: Other (Comment) (posterior, premature loss of bolus) Oral - Solids Oral - Puree: Within functional limits Oral - Regular: Within functional limits Oral - Pill: Within functional limits   Pharyngeal Phase Pharyngeal Phase Pharyngeal Phase: Impaired Pharyngeal - Nectar Pharyngeal - Nectar Cup: Penetration/Aspiration before swallow;Delayed swallow initiation;Premature spillage to pyriform sinuses Penetration/Aspiration details (nectar cup): Material does not enter airway Pharyngeal - Thin Pharyngeal - Thin Teaspoon: Delayed swallow initiation;Premature spillage to pyriform sinuses;Reduced airway/laryngeal  closure;Penetration/Aspiration before swallow Penetration/Aspiration details (thin teaspoon): Material enters airway, remains ABOVE vocal cords and not ejected out;Material does not enter airway Pharyngeal - Thin Cup: Delayed swallow initiation;Premature spillage to pyriform sinuses;Reduced airway/laryngeal closure;Penetration/Aspiration before swallow;Moderate aspiration Penetration/Aspiration details (thin cup): Material enters airway, passes BELOW cords without attempt by patient to eject out (silent aspiration);Material enters airway, CONTACTS cords and not ejected out Pharyngeal - Solids Pharyngeal - Puree: Within functional limits Pharyngeal - Regular: Within functional limits  Cervical Esophageal Phase    GO             Harlon Ditty, MA CCC-SLP [phone]  Claudine Mouton 09/25/2012, 2:20 PM

## 2012-09-25 NOTE — Progress Notes (Signed)
CSW received referral for SNF placement. CSW will meet with the pt and/or family to search for placement.  Evonna Stoltz, LCSW-A Clinical Social Worker [phone]   

## 2012-09-25 NOTE — Evaluation (Signed)
Occupational Therapy Evaluation Patient Details Name: Collin Diaz MRN: [id number] DOB: February 14, 1956 Today's Date: 09/25/2012 Time: 4098-1191 OT Time Calculation (min): 14 min  OT Assessment / Plan / Recommendation Clinical Impression  Pt is a 57 year old man admitted with syncope in the setting of 3 days of diarrhea, dehydration, and ARF.  Pt has baseline L hemiplegia from previous CVA.  He requires assist for ADL and transfers with +1 assist at home using a sliding board.  He is non ambulatory.  Pt presents with decreased sitting balance and difficulty with transfers compared to baseline.  Will follow to address toilet transfers with transfer board and sitting balance as precursor to ADL.    OT Assessment  Patient needs continued OT Services    Follow Up Recommendations  Supervision/Assistance - 24 hour    Barriers to Discharge      Equipment Recommendations  None recommended by OT    Recommendations for Other Services    Frequency  Min 2X/week    Precautions / Restrictions Precautions Precautions: Fall Restrictions Weight Bearing Restrictions: Yes LUE Weight Bearing: Non weight bearing LLE Weight Bearing: Non weight bearing   Pertinent Vitals/Pain No pain.    ADL  Eating/Feeding: NPO Grooming: Wash/dry hands;Wash/dry face;Set up Where Assessed - Grooming: Supported sitting Upper Body Bathing: Maximal assistance Where Assessed - Upper Body Bathing: Supported sitting Lower Body Bathing: +1 Total assistance Where Assessed - Lower Body Bathing: Supported sitting;Rolling right and/or left Upper Body Dressing: Moderate assistance Where Assessed - Upper Body Dressing: Supported sitting Lower Body Dressing: +1 Total assistance Where Assessed - Lower Body Dressing: Supported sitting;Rolling right and/or left Toilet Transfer: +2 Total assistance Toilet Transfer: Patient Percentage: 50% Toilet Transfer Method: Stand pivot Acupuncturist: Architectural technologist and Hygiene: +1 Total assistance Where Assessed - Glass blower/designer Manipulation and Hygiene: Sit on 3-in-1 or toilet Equipment Used: Gait belt Transfers/Ambulation Related to ADLs: pt in non ambulatory, stand pivot +2 pt 50% toward L ADL Comments: Pt is dependent at baseline in bathing, dressing, toileting, requires set up for eating and thickened liquids.  Pt does not shower due to bathroom being inaccessible to w/c.    OT Diagnosis: Generalized weakness  OT Problem List: Decreased strength;Impaired balance (sitting and/or standing);Impaired UE functional use OT Treatment Interventions: Splinting;Patient/family education;Balance training   OT Goals Acute Rehab OT Goals OT Goal Formulation: With patient Time For Goal Achievement: 10/02/12 Potential to Achieve Goals: Good ADL Goals Pt Will Transfer to Toilet: with mod assist;with transfer board;Drop arm 3-in-1 ADL Goal: Toilet Transfer - Progress: Goal set today Miscellaneous OT Goals Miscellaneous OT Goal #1: Pt will perform supine to sit at EOB with min assist in prep for ADL. OT Goal: Miscellaneous Goal #1 - Progress: Goal set today Miscellaneous OT Goal #2: Pt will sit with supervision at EOB in prep for ADL x 5 minutes. OT Goal: Miscellaneous Goal #2 - Progress: Goal set today  Visit Information  Last OT Received On: 09/25/12 Assistance Needed: +2 PT/OT Co-Evaluation/Treatment: Yes    Subjective Data  Subjective: "My wife helps me." Patient Stated Goal: Return home with wife.   Prior Functioning     Home Living Lives With: Spouse (brother in law) Available Help at Discharge: Family;Available 24 hours/day Type of Home: Apartment Home Access: Ramped entrance Home Layout: One level Bathroom Shower/Tub: Engineer, manufacturing systems: Standard Bathroom Accessibility: No Home Adaptive Equipment: Wheelchair - manual;Hospital bed (drop arm commode, transfer board) Prior Function Level of  Independence: Needs assistance Needs Assistance: Bathing;Dressing;Toileting;Transfers;Meal Prep;Light Housekeeping;Feeding;Grooming Bath: Moderate Dressing: Moderate Feeding: Minimal Grooming: Minimal Toileting: Total Meal Prep: Total Light Housekeeping: Total Transfer Assistance: +1 with transfer board Able to Take Stairs?: No Driving: No Communication Communication: No difficulties Dominant Hand: Right         Vision/Perception Vision - History Baseline Vision: Wears glasses only for reading Patient Visual Report: No change from baseline   Cognition  Cognition Arousal/Alertness: Awake/alert Behavior During Therapy: WFL for tasks assessed/performed Overall Cognitive Status: Within Functional Limits for tasks assessed    Extremity/Trunk Assessment Right Upper Extremity Assessment RUE ROM/Strength/Tone: [facility] for tasks assessed Left Upper Extremity Assessment LUE ROM/Strength/Tone: Deficits LUE ROM/Strength/Tone Deficits: longstanding hemiplegia with flexor tone, no functional use, has a hand splint Trunk Assessment Trunk Assessment: Kyphotic     Mobility Bed Mobility Bed Mobility: Supine to Sit;Sitting - Scoot to Edge of Bed Supine to Sit: 2: Max assist Sitting - Scoot to [place] of Bed: 2: Max assist Transfers Transfers: Sit to Stand;Stand to Sit Sit to Stand: 1: +2 Total assist;From bed Sit to Stand: Patient Percentage: 50% Stand to Sit: 1: +2 Total assist;To chair/3-in-1 Stand to Sit: Patient Percentage: 50% Details for Transfer Assistance: flexed posture in standing, pt typically does a sliding board transfer      Exercise     Balance Balance Balance Assessed: Yes Static Sitting Balance Static Sitting - Balance Support: Feet supported;Right upper extremity supported Static Sitting - Level of Assistance: 3: Mod assist;2: Max assist (pt with posterior and R side lean)   End of Session OT - End of Session Activity Tolerance: Patient tolerated treatment  well Patient left: in chair;with call bell/phone within reach Nurse Communication: Mobility status  GO     Evern Bio 09/25/2012, 10:34 AM [phone]
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
## 2012-09-25 NOTE — Progress Notes (Signed)
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  No recent ill contacts.  No n/v   Objective    Interim History: none  Telemetry: nsr  Objective: Filed Vitals:   09/24/12 1425 09/24/12 2147 09/25/12 0500 09/25/12 0531  BP: 116/81 123/84 148/97   Pulse: 77 75 57   Temp: 98.3 F (36.8 C) 98.1 F (36.7 C) 98.1 F (36.7 C)   TempSrc:      Resp: 14 18 18 18   Height:      Weight:      SpO2: 98% 100% 100%     Intake/Output Summary (Last 24 hours) at 09/25/12 1313 Last data filed at 09/25/12 1300  Gross per 24 hour  Intake    360 ml  Output   1850 ml  Net  -1490 ml    Exam:  General: alert pleasant oriented, in nad Cardiovascular: s1 s2 no m/r/g Respiratory: clear Abdomen: soft/nt/nd Skin intact Neuro dense hemiplegia to entire l side, grade 1/5-0/5 power without ability to lift or even move arm.  Some contractures noted.  Reflexes brisker on that side.  Data Reviewed: Basic Metabolic Panel:  Recent Labs Lab 09/24/12 0215 09/24/12 0225 09/25/12 0450  NA 138 142 143  K 4.3 4.5 3.7  CL 108 113* 113*  CO2 21  --  19  GLUCOSE 82 80 80  BUN 43* 42* 33*  CREATININE 3.09* 3.10* 2.21*  CALCIUM 9.4  --  9.1   Liver Function Tests:  Recent Labs Lab 09/24/12 0215  AST 49*  ALT 131*  ALKPHOS 130*  BILITOT 0.4  PROT 6.5  ALBUMIN 3.6   No results found for this basename: LIPASE, AMYLASE,  in the last 168 hours No results found for this basename: AMMONIA,  in the  last 168 hours CBC:  Recent Labs Lab 09/24/12 0215 09/24/12 0225 09/25/12 0450  WBC 3.2*  --  3.4*  HGB 12.3* 12.9* 11.8*  HCT 36.5* 38.0* 34.3*  MCV 86.7  --  85.8  PLT 82*  --  82*   Cardiac Enzymes:  Recent Labs Lab 09/24/12 0459 09/24/12 1120 09/24/12 1633  TROPONINI <0.30 <0.30 <0.30   BNP: No components found with this basename: POCBNP,  CBG: No results found for this basename: GLUCAP,  in the last 168 hours  Recent Results (from the past 240 hour(s))  STOOL CULTURE     Status: None   Collection Time    09/24/12  3:00 PM      Result Value Range Status   Specimen Description STOOL   Final   Special Requests Normal   Final   Culture Culture reincubated for better growth   Final   Report Status PENDING   Incomplete  CLOSTRIDIUM DIFFICILE BY PCR     Status: None   Collection Time    09/24/12  3:00 PM      Result Value Range Status   C difficile by pcr NEGATIVE  NEGATIVE Final     Studies:  All Imaging reviewed and is as per above notation   Scheduled Meds: . aspirin  325 mg Oral Daily  . citalopram  20 mg Oral Daily  . doxazosin  8 mg Oral QHS  . DULoxetine  30 mg Oral Daily  . fluticasone  1 spray Each Nare Daily  . gabapentin  100 mg Oral TID  . heparin  5,000 Units Subcutaneous Q8H  . levETIRAcetam  500 mg Oral Q12H  . metoprolol tartrate  25 mg Oral BID  . pantoprazole  20 mg Oral Daily  . sodium chloride  3 mL Intravenous Q12H  . traZODone  50 mg Oral QHS   Continuous Infusions: . sodium chloride 100 mL/hr at 09/24/12 1654     Assessment/Plan: 1. Diarrhea-unclear etiology-await stool culture but seems self limiting and Cdiff PCR is neg.  Continue supportive 2. Acute kidney injury-likely 2/2 to volume depletion-diarrhea/poor po intake.  continue  Change rate to 50cc/h and reassess am c BMET 3. Moderate Pancytopenia-baseline platelet counts 140's-could be infectious-get peripheral smear 4. H/o CVA's-1 hemorrhagic-continue ASA 325  daily, Keppra 500 bid, Gabapentin 100 tid 5. Htn-Cont Metoprolol 25 bid, Cardura 8 mg daily 6. Depression-continue celexa 20 mg daily, Cymbalta 30 daily, trazadone 50 daily 7. Dysphagia-continue dys 3 diet.  Appreciate speech input c MBS   Code Status: full Family Communication: none Disposition Plan: inpatient   Pleas Koch, MD  [facility] Pager [phone] 09/25/2012, 1:13 PM    LOS: 1 day

## 2012-09-26 ENCOUNTER — Ambulatory Visit (HOSPITAL_COMMUNITY): Payer: Medicaid Other

## 2012-09-26 ENCOUNTER — Ambulatory Visit: Payer: Medicaid Other | Admitting: Physical Medicine & Rehabilitation

## 2012-09-26 DIAGNOSIS — R197 Diarrhea, unspecified: Principal | ICD-10-CM | POA: Diagnosis present

## 2012-09-26 LAB — BASIC METABOLIC PANEL
BUN: 24 mg/dL — ABNORMAL HIGH (ref 6–23)
GFR calc Af Amer: 47 mL/min — ABNORMAL LOW (ref >90)
GFR calc non Af Amer: 40 mL/min — ABNORMAL LOW (ref >90)
Potassium: 3.9 mEq/L (ref 3.5–5.1)
Sodium: 143 mEq/L (ref 135–145)

## 2012-09-26 NOTE — Care Management Note (Signed)
    Page 1 of 2   09/26/2012     9:59:27 AM   CARE MANAGEMENT NOTE 09/26/2012  Patient:  BLUE, WINTHER   Account Number:  [id number]  Date Initiated:  09/26/2012  Documentation initiated by:  GRAVES-BIGELOW,Shontay Wallner  Subjective/Objective Assessment:   Pt admitted with syncope. Pt has had a previos stroke and is from home with wife. Pt will have to return home with wife. Pt's wife is refusing SNF at this time.     Action/Plan:   CM did provide wife with PACE packet. CM also made wife aware to call DSS to see if they can increase the hours for aide. Pt was previously active with [facility] for services and will return home with them. SOC to begin within 24-48 hours.   Anticipated DC Date:  09/26/2012   Anticipated DC Plan:  HOME W HOME HEALTH SERVICES      DC Planning Services  CM consult      Choice offered to / List presented to:  C-1 Patient        HH arranged  HH-1 RN  HH-10 DISEASE MANAGEMENT  HH-2 PT  HH-3 OT  HH-6 SOCIAL WORKER      HH agency  [facility]   Status of service:  Completed, signed off Medicare Important Message given?   (If response is "NO", the following Medicare IM given date fields will be blank) Date Medicare IM given:   Date Additional Medicare IM given:    Discharge Disposition:  HOME W HOME HEALTH SERVICES  Per UR Regulation:  Reviewed for med. necessity/level of care/duration of stay  If discussed at Long Length of Stay Meetings, dates discussed:    Comments:  09-26-12 0955 Tomi Bamberger, RN BSN [phone] CM did call [name] the wife of pt and she is aware that pt will return home today via Ambulance. CM did call CIR to see if they have bed availability today and they do not. Pt is medically stable for d/c- so plan will be for home with [facility] services. No further needs from CM at this time.

## 2012-09-26 NOTE — Discharge Summary (Signed)
Physician Discharge Summary  Collin Diaz XBJ:[id number] DOB: 06/17/1955 DOA: 09/24/2012  PCP: Marletta Lor, NP  Admit date: 09/24/2012 Discharge date: 09/26/2012  Time spent: 35 minutes  Recommendations for Outpatient Follow-up:  1. Recommend further work-up for Thrombocytopenia as per PCP/Hematology  2. Needs bmet in 1 week as well 3. Needs Pt/Ot/slp/RN/Sw involved as out-patient 4. Speech therapy recommended dysphagia 3 diet as patient's family aspirates and limiting bolus small teaspoon amounts and thickening to nectar thick "Pt demonstrates little to no functional change since last MBS. Pt continues to silently aspirate thin liquids in most trials due to a delay in swallow initiation and decreased laryngeal closure and lack of sensation. SLP attempted chin tuck and head turns with no success in decreasing frequency or severity of aspiration. Cues cough/thrat clear does not expel aspirate. The only effective method to decrease aspiration is limiting bolus size to small teaspoon amounts or by thickening to nectar. For now, recommend pt consume a dys 3 (mech soft) diet with nectar thick liquids. Will discuss options with wife, though defer also to treating SLP. "   Discharge Diagnoses:  Principal Problem:   Diarrhea Active Problems:   KIDNEY DISEASE, CHRONIC, STAGE V   Obesity (BMI 30.0-34.9)   OSA (obstructive sleep apnea)   Syncope   Acute-on-chronic renal failure   Hemiplegia, unspecified, affecting dominant side   Discharge Condition: Able  Diet recommendation: Dysphagia 3  Filed Weights   09/24/12 0605  Weight: 87 kg (191 lb 12.8 oz)    Brief narrative:  57yr old male with h/o CVA's presented to [facility] with 1 week h/o diarrhea 09/24/12. He has an extensive neurological history and has some renal insufficiency but had a syncopal episode after having this diarrhea. There is no seizure activity chest pain or shortness of breath no recent antibiotic use, travel, ill contacts was  noted he had slightly elevated LFTs and patient was admitted or syncope secondary to possible diarrhea Workup for diarrhea included further stool studies including C. difficile PCR and stool cultures which showed no suspicious colonies on either. Was thought likely that his diarrhea was viral in etiology and this self-resolved while in the hospital.  Speech therapy and PT/OT saw him and recommended SNF placement/CIR but wife refused and wanted to take him home.    1. Diarrhea-unclear etiology-await stool culture but seems self limiting and Cdiff PCR is neg. Continue supportive measures at home-needs BMET in about 1 week 2. Acute kidney injury-likely 2/2 to volume depletion-diarrhea/poor po intake. Was on IVF.  Creat trended down to 1.8 3. Moderate Pancytopenia-baseline platelet counts 140's-could be infectious-smear showed large platelets.  Unclear but likely 2/2 infection as WBC was low.  If persists would work-up as out-patient 4. H/o CVA's-1 hemorrhagic-continue ASA 325 daily, Keppra 500 bid, Gabapentin 100 tid 5. Htn-Cont Metoprolol 25 bid, Cardura 8 mg daily 6. Depression-continue celexa 20 mg daily, Cymbalta 30 daily, trazadone 50 daily 7. Dysphagia-continue dys 3 diet. 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, Malignant htn   Consultants:  None currently  Procedures:  Modified barium swallow  Antibiotics:  None currently   Discharge Exam: Filed Vitals:   09/25/12 1329 09/25/12 2218 09/26/12 0524 09/26/12 0758  BP: 154/97 136/98 162/110 148/90  Pulse: 63 68    Temp: 97.7 F (36.5 C) 97.5 F (36.4 C)    TempSrc:  Oral Oral    Resp: 14 18 18    Height:      Weight:      SpO2: 99% 100% 100%    Doing well, no further diarrhea, no cp/n/v/sob  General: eomi, weaker on the L side Cardiovascular: s1 s 2no m/r/g Respiratory:   clear  Discharge Instructions  Discharge Orders   Future Orders Complete By Expires     Call MD for:  hives  As directed     Call MD for:  persistant dizziness or light-headedness  As directed     Call MD for:  persistant nausea and vomiting  As directed     Call MD for:  redness, tenderness, or signs of infection (pain, swelling, redness, odor or green/yellow discharge around incision site)  As directed     Call MD for:  temperature >100.4  As directed     Diet - low sodium heart healthy  As directed     Increase activity slowly  As directed         Medication List    TAKE these medications       aspirin 325 MG tablet  Take 325 mg by mouth daily.     citalopram 20 MG tablet  Commonly known as:  CELEXA  Take 20 mg by mouth daily.     doxazosin 8 MG tablet  Commonly known as:  CARDURA  Take 8 mg by mouth at bedtime.     DULoxetine 30 MG capsule  Commonly known as:  CYMBALTA  Take 30 mg by mouth daily.     fluticasone 50 MCG/ACT nasal spray  Commonly known as:  FLONASE  Place 1 spray into the nose daily.     gabapentin 100 MG capsule  Commonly known as:  NEURONTIN  Take 100 mg by mouth 3 (three) times daily.     levETIRAcetam 500 MG tablet  Commonly known as:  KEPPRA  Take 500 mg by mouth every 12 (twelve) hours.     metoprolol tartrate 25 MG tablet  Commonly known as:  LOPRESSOR  Take 25 mg by mouth 2 (two) times daily.     oxyCODONE-acetaminophen 5-325 MG per tablet  Commonly known as:  PERCOCET/ROXICET  Take 1 tablet by mouth every 4 (four) hours as needed for pain.     pantoprazole 20 MG tablet  Commonly known as:  PROTONIX  Take 20 mg by mouth daily.     traZODone 50 MG tablet  Commonly known as:  DESYREL  Take 50 mg by mouth at bedtime.       No Known Allergies    The results of significant diagnostics from this hospitalization (including imaging, microbiology, ancillary and laboratory) are listed below for reference.    Significant  Diagnostic Studies: Ct Head Wo Contrast  09/01/2012   *RADIOLOGY REPORT*  Clinical Data: Headache.  Hypertension.  Previous stroke.  CT HEAD WITHOUT CONTRAST  Technique:  Contiguous axial images were obtained from the base of the skull through the vertex without contrast.  Comparison: 04/04/2012  Findings: There is no evidence of intracranial hemorrhage, brain edema or other signs of acute infarction.  There is no evidence of intracranial mass lesion or mass effect.  No abnormal extra-axial fluid collections are identified.  No evidence of hydrocephalus.  Extensive chronic small vessel disease again demonstrated as well as old bilateral basal ganglia and thalamic lacunar infarcts.  No skull abnormality identified.  IMPRESSION:  1.  No acute intracranial abnormality. 2.  Stable chronic  small vessel disease and old bilateral lacunar infarcts.   Original Report Authenticated By: Myles Rosenthal, M.D.   Dg Swallowing Func-speech Pathology  09/25/2012   Riley Nearing Deblois, CCC-SLP     09/25/2012  2:22 PM Objective Swallowing Evaluation: Modified Barium Swallowing Study   Patient Details  Name: Collin Diaz MRN: [id number] Date of Birth: [birth year]  Today's Date: 09/25/2012 Time: 4098-1191 SLP Time Calculation (min): 28 min  Past Medical History:  Past Medical History  Diagnosis Date  . Hypertension   . Arthritis 04/04/2012  . Seizure 04/04/2012    POST CVA 12/2011  . RSD (reflex sympathetic dystrophy) 04/04/2012    LEFT HAND  . Dysphagia S/P CVA (cerebrovascular accident) 04/04/2012  . Aphasia 04/04/2012    HX OF POST cva  . Stroke 10/2010    LEFT mca cva ISCHEMIC  . 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 Chronic kidney disease   . Depression   . RSD (reflex sympathetic dystrophy) 2013    LEFT HAND   Past Surgical History:  Past Surgical History  Procedure Laterality Date  . No past surgeries     HPI:  Collin Diaz is an 57 y.o. 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 He did not have any seizure  activities, CP or shortness of breath. Wife stated he has had  diarrhea for three days.  Following CVA's, pt with resulting  dysphgia, has been seen by SLP service multiple time sin the  past, recommended to consume Dys3/nectar at last MBS in 1/14.  Prior to admit, pt scheduled for repeat OP MBS on 09/26/12 at  [facility] to determine readiness for upgrade.  Per the wife, pt  continues to drink nectar thick liquids at home; pt reports he  drinks thin liquids. Given new admission, pt recommended to  complete MBS while an inpatient.      Assessment / Plan / Recommendation Clinical Impression  Dysphagia Diagnosis: Moderate pharyngeal phase dysphagia Clinical impression: Pt demonstrates little to no functional  change since last MBS. Pt continues to silently aspirate thin  liquids in most trials due to a delay in swallow initiation and  decreased laryngeal closure and lack of sensation. SLP attempted  chin tuck and head turns with no success in decreasing frequency  or severity of aspiration.  Cues cough/thrat clear does not expel  aspirate. The only effective method to decrease aspiration is  limiting bolus size to small teaspoon amounts or by thickening to  nectar. For now, recommend pt consume a dys 3 (mech soft) diet  with nectar thick liquids. Will discuss options with wife, though  defer also to treating SLP.     Treatment Recommendation  Therapy as outlined in treatment plan below    Diet Recommendation Dysphagia 3 (Mechanical Soft);Nectar-thick  liquid   Liquid Administration via: Cup Medication Administration: Whole meds with puree Supervision: Patient able to self feed;Intermittent supervision  to cue for compensatory strategies Compensations: Slow rate;Small sips/bites Postural Changes and/or Swallow Maneuvers: Seated upright 90  degrees    Other   Recommendations Oral Care Recommendations: Oral care BID Other Recommendations: Order thickener from pharmacy   Follow Up Recommendations  Home health SLP    Frequency and Duration min 2x/week  1 week   Pertinent Vitals/Pain NA    SLP  Swallow Goals Patient will utilize recommended strategies during swallow to  increase swallowing safety with: Minimal cueing Swallow Study Goal #2 - Progress: Progressing toward goal   General HPI: Collin Diaz is an 57 y.o. male with hx of  prior CVA resulting in left hemiplegia, HTN, Seizure, several  CVAs including hemorrhagic CVA, CKD stage V, presents to the ER  with a syncope after having diarrhea. He did not have any seizure  activities, CP or shortness of breath. Wife stated he has had  diarrhea for three days.  Following CVA's, pt with resulting  dysphgia, has been seen by SLP service multiple time sin the  past, recommended to consume Dys3/nectar at last MBS in 1/14.  Prior to admit, pt scheduled for repeat OP MBS on 09/26/12 at  [facility] to determine readiness for upgrade.  Per the wife, pt  continues to drink nectar thick liquids at home; pt reports he  drinks thin liquids. Given new admission, pt recommended to  complete MBS while an inpatient.  Type of Study: Modified Barium Swallowing Study Reason for Referral: Objectively evaluate swallowing function Previous Swallow Assessment: several, see HPI Diet Prior to this Study: Dysphagia 3 (soft);Nectar-thick liquids Temperature Spikes Noted: No Respiratory Status: Room air History of Recent Intubation: No Behavior/Cognition: Alert;Cooperative Oral Cavity - Dentition: Adequate natural dentition Oral Motor / Sensory Function: Within functional limits Self-Feeding Abilities: Able to feed self Patient Positioning: Upright in chair Baseline Vocal Quality: Clear;Low vocal intensity Volitional Cough: Weak Volitional Swallow: Able to elicit Anatomy: Within functional limits Pharyngeal Secretions: Not observed secondary MBS     Reason for Referral Objectively evaluate swallowing function   Oral Phase Oral Preparation/Oral Phase Oral Phase: Impaired Oral - Nectar Oral - Nectar Cup: Other (Comment) (posterior, premature loss of  bolus) Oral - Thin Oral - Thin Teaspoon: Other (Comment) (posterior, premature loss  of bolus) Oral - Thin Cup: Other (Comment) (posterior, premature loss of  bolus) Oral - Solids Oral - Puree: Within functional limits Oral - Regular: Within functional limits Oral - Pill: Within functional limits   Pharyngeal Phase Pharyngeal Phase Pharyngeal Phase: Impaired Pharyngeal - Nectar Pharyngeal - Nectar Cup: Penetration/Aspiration before  swallow;Delayed swallow initiation;Premature spillage to pyriform  sinuses Penetration/Aspiration details (nectar cup): Material does not  enter airway Pharyngeal - Thin Pharyngeal - Thin Teaspoon: Delayed swallow initiation;Premature  spillage to pyriform sinuses;Reduced airway/laryngeal  closure;Penetration/Aspiration before swallow Penetration/Aspiration details (thin teaspoon): Material enters  airway, remains ABOVE vocal cords and not ejected out;Material  does not enter airway Pharyngeal - Thin Cup: Delayed swallow initiation;Premature  spillage to pyriform sinuses;Reduced airway/laryngeal  closure;Penetration/Aspiration before swallow;Moderate aspiration Penetration/Aspiration details (thin cup): Material enters  airway, passes BELOW cords without attempt by patient to eject  out (silent aspiration);Material enters airway, CONTACTS cords  and not ejected out Pharyngeal - Solids Pharyngeal - Puree: Within functional limits Pharyngeal - Regular: Within functional limits  Cervical Esophageal Phase    GO             Harlon Ditty, MA CCC-SLP [phone]  DeBlois, Riley Nearing 09/25/2012, 2:20 PM     Microbiology: Recent Results (from the past 240 hour(s))  STOOL CULTURE     Status: None   Collection Time    09/24/12  3:00 PM      Result Value Range Status   Specimen  Description STOOL   Final   Special Requests Normal   Final   Culture NO SUSPICIOUS COLONIES, CONTINUING TO HOLD   Final  Report Status PENDING   Incomplete  CLOSTRIDIUM DIFFICILE BY PCR     Status: None   Collection Time    09/24/12  3:00 PM      Result Value Range Status   C difficile by pcr NEGATIVE  NEGATIVE Final     Labs: Basic Metabolic Panel:  Recent Labs Lab 09/24/12 0215 09/24/12 0225 09/25/12 0450 09/25/12 1355 09/26/12 0530  NA 138 142 143 142 143  K 4.3 4.5 3.7 3.9 3.9  CL 108 113* 113* 111 113*  CO2 21  --  19 21 22   GLUCOSE 82 80 80 86 87  BUN 43* 42* 33* 28* 24*  CREATININE 3.09* 3.10* 2.21* 1.89* 1.81*  CALCIUM 9.4  --  9.1 9.2 9.4   Liver Function Tests:  Recent Labs Lab 09/24/12 0215  AST 49*  ALT 131*  ALKPHOS 130*  BILITOT 0.4  PROT 6.5  ALBUMIN 3.6   No results found for this basename: LIPASE, AMYLASE,  in the last 168 hours No results found for this basename: AMMONIA,  in the last 168 hours CBC:  Recent Labs Lab 09/24/12 0215 09/24/12 0225 09/25/12 0450 09/25/12 1355  WBC 3.2*  --  3.4* 2.9*  HGB 12.3* 12.9* 11.8* 13.0  HCT 36.5* 38.0* 34.3* 37.4*  MCV 86.7  --  85.8 85.6  PLT 82*  --  82* 110*   Cardiac Enzymes:  Recent Labs Lab 09/24/12 0459 09/24/12 1120 09/24/12 1633  TROPONINI <0.30 <0.30 <0.30   BNP: BNP (last 3 results)  Recent Labs  04/04/12 0633  PROBNP 116.6   CBG: No results found for this basename: GLUCAP,  in the last 168 hours     Signed:  Rhetta Mura  [facility] 09/26/2012, 10:48 AM

## 2012-09-26 NOTE — Progress Notes (Signed)
Speech Language Pathology Dysphagia Treatment Patient Details Name: Collin Diaz MRN: [id number] DOB: 1955-12-26 Today's Date: 09/26/2012 Time: 1191-4782 SLP Time Calculation (min): 16 min  Assessment / Plan / Recommendation Clinical Impression  Pt seen at bedside following MBS. Pt contineud to demonstrate adequate tolerance of nectar thick liquids. Given that pt is likely consuming thin liquids at home against recommendations, SLp attempting to find a way for pt to safely consuem thin liquids. When given teaspoon sips of water pt does continue to evidence aspiration with hard cough, but quantitiy is suspected to be minimal. If given water only pt may be able to enjoy thin liquids with low risk of impact of aspiration. Will not institute plan in hospital, but it may be an option after d/c. Marland Kitchen Pt should continue to work with [facility] SLP to manage this situation. Will discuss with wife.     Diet Recommendation  Continue with Current Diet: Dysphagia 3 (mechanical soft);Nectar-thick liquid    SLP Plan     Pertinent Vitals/Pain NA   Swallowing Goals  SLP Swallowing Goals Patient will utilize recommended strategies during swallow to increase swallowing safety with: Minimal cueing Swallow Study Goal #2 - Progress: Progressing toward goal  General Temperature Spikes Noted: No Respiratory Status: Room air Behavior/Cognition: Alert;Cooperative Oral Cavity - Dentition: Adequate natural dentition Patient Positioning: Upright in bed  Oral Cavity - Oral Hygiene Does patient have any of the following "at risk" factors?: Other - dysphagia;Diet - patient on thickened liquids Brush patient's teeth BID with toothbrush (using toothpaste with fluoride): Yes Patient is AT RISK - Oral Care Protocol followed (see row info): Yes   Dysphagia Treatment Treatment focused on: Skilled observation of diet tolerance;Upgraded PO texture trials;Patient/family/caregiver education Treatment Methods/Modalities: Skilled  observation;Differential diagnosis Patient observed directly with PO's: Yes Type of PO's observed: Nectar-thick liquids;Thin liquids Feeding: Needs assist Liquids provided via: Teaspoon;Cup Pharyngeal Phase Signs & Symptoms: Delayed cough Type of cueing: Verbal Amount of cueing: Minimal   GO    Harlon Ditty, MA CCC-SLP [phone]  Claudine Mouton 09/26/2012, 12:01 PM

## 2012-09-26 NOTE — Progress Notes (Signed)
Clinical Social Work Department CLINICAL SOCIAL WORK PLACEMENT NOTE 09/26/2012  Patient:  Collin Diaz, Collin Diaz  Account Number:  [id number] Admit date:  09/24/2012  Clinical Social Worker:  Kirke Shaggy  Date/time:  09/25/2012 10:36 AM  Clinical Social Work is seeking post-discharge placement for this patient at the following level of care:   SKILLED NURSING   (*CSW will update this form in Epic as items are completed)   09/25/2012  Patient/family provided with [facility] Department of Clinical Social Work's list of facilities offering this level of care within the geographic area requested by the patient (or if unable, by the patient's family).  09/25/2012  Patient/family informed of their freedom to choose among providers that offer the needed level of care, that participate in Medicare, Medicaid or managed care program needed by the patient, have an available bed and are willing to accept the patient.    Patient/family informed of [facility]' ownership interest in [facility], as well as of the fact that they are under no obligation to receive care at this facility.  PASARR submitted to EDS on  PASARR number received from EDS on   FL2 transmitted to all facilities in geographic area requested by pt/family on   FL2 transmitted to all facilities within larger geographic area on   Patient informed that his/her managed care company has contracts with or will negotiate with  certain facilities, including the following:     Patient/family informed of bed offers received:   Patient chooses bed at  Physician recommends and patient chooses bed at    Patient to be transferred to  on  09/26/2012 Patient to be transferred to facility by   The following physician request were entered in Epic:   Additional Comments: Wife is refusing SNF placement. She is refusing due to the pt having "a nightmare of an experience at [facility]". 09/25/12-CSW spoke with the  wife at great length about trying other SNF's for her husband. The wife is asking for additional [facility] services to be set up besides the 2 aides 2x each day. RNCM has also talked with the pt's wife about SNF placement. Wife still refusing SNF. CSW is setting up ambulance transportation for this pt to d/c home today. CSW is signing off of this pt.   Sherald Barge, LCSW-A Clinical Social Worker [phone]

## 2012-09-28 LAB — STOOL CULTURE: Special Requests: NORMAL

## 2012-09-30 ENCOUNTER — Ambulatory Visit: Payer: Medicaid Other | Admitting: Physical Medicine & Rehabilitation

## 2012-10-06 ENCOUNTER — Emergency Department (HOSPITAL_COMMUNITY)
Admission: EM | Admit: 2012-10-06 | Discharge: 2012-10-07 | Disposition: A | Discharge status: Home / Self Care | Payer: Medicaid Other | Attending: Emergency Medicine | Admitting: Emergency Medicine

## 2012-10-06 ENCOUNTER — Emergency Department (HOSPITAL_COMMUNITY): Payer: Medicaid Other

## 2012-10-06 DIAGNOSIS — Z8739 Personal history of other diseases of the musculoskeletal system and connective tissue: Secondary | ICD-10-CM | POA: Insufficient documentation

## 2012-10-06 DIAGNOSIS — I129 Hypertensive chronic kidney disease with stage 1 through stage 4 chronic kidney disease, or unspecified chronic kidney disease: Secondary | ICD-10-CM | POA: Insufficient documentation

## 2012-10-06 DIAGNOSIS — N189 Chronic kidney disease, unspecified: Secondary | ICD-10-CM | POA: Insufficient documentation

## 2012-10-06 DIAGNOSIS — F3289 Other specified depressive episodes: Secondary | ICD-10-CM | POA: Insufficient documentation

## 2012-10-06 DIAGNOSIS — R482 Apraxia: Secondary | ICD-10-CM | POA: Insufficient documentation

## 2012-10-06 DIAGNOSIS — R079 Chest pain, unspecified: Secondary | ICD-10-CM | POA: Insufficient documentation

## 2012-10-06 DIAGNOSIS — F329 Major depressive disorder, single episode, unspecified: Secondary | ICD-10-CM | POA: Insufficient documentation

## 2012-10-06 DIAGNOSIS — R059 Cough, unspecified: Secondary | ICD-10-CM | POA: Insufficient documentation

## 2012-10-06 DIAGNOSIS — G40909 Epilepsy, unspecified, not intractable, without status epilepticus: Secondary | ICD-10-CM | POA: Insufficient documentation

## 2012-10-06 DIAGNOSIS — Z79899 Other long term (current) drug therapy: Secondary | ICD-10-CM | POA: Insufficient documentation

## 2012-10-06 DIAGNOSIS — Z7982 Long term (current) use of aspirin: Secondary | ICD-10-CM | POA: Insufficient documentation

## 2012-10-06 DIAGNOSIS — M129 Arthropathy, unspecified: Secondary | ICD-10-CM | POA: Insufficient documentation

## 2012-10-06 DIAGNOSIS — R1310 Dysphagia, unspecified: Secondary | ICD-10-CM | POA: Insufficient documentation

## 2012-10-06 DIAGNOSIS — R0602 Shortness of breath: Secondary | ICD-10-CM | POA: Insufficient documentation

## 2012-10-06 DIAGNOSIS — R05 Cough: Secondary | ICD-10-CM

## 2012-10-06 DIAGNOSIS — Z8679 Personal history of other diseases of the circulatory system: Secondary | ICD-10-CM | POA: Insufficient documentation

## 2012-10-06 DIAGNOSIS — I69991 Dysphagia following unspecified cerebrovascular disease: Secondary | ICD-10-CM | POA: Insufficient documentation

## 2012-10-06 LAB — COMPREHENSIVE METABOLIC PANEL
ALT: 223 U/L — ABNORMAL HIGH (ref 0–53)
AST: 83 U/L — ABNORMAL HIGH (ref 0–37)
Albumin: 3.7 g/dL (ref 3.5–5.2)
Alkaline Phosphatase: 157 U/L — ABNORMAL HIGH (ref 39–117)
BUN: 34 mg/dL — ABNORMAL HIGH (ref 6–23)
Chloride: 107 mEq/L (ref 96–112)
Potassium: 3.5 mEq/L (ref 3.5–5.1)
Sodium: 140 mEq/L (ref 135–145)
Total Bilirubin: 0.5 mg/dL (ref 0.3–1.2)

## 2012-10-06 LAB — CBC WITH DIFFERENTIAL/PLATELET
Basophils Absolute: 0 10*3/uL (ref 0.0–0.1)
Basophils Relative: 1 % (ref 0–1)
Hemoglobin: 12.4 g/dL — ABNORMAL LOW (ref 13.0–17.0)
MCHC: 34.1 g/dL (ref 30.0–36.0)
Monocytes Relative: 9 % (ref 3–12)
Neutro Abs: 1.2 10*3/uL — ABNORMAL LOW (ref 1.7–7.7)
Neutrophils Relative %: 33 % — ABNORMAL LOW (ref 43–77)
Platelets: 136 10*3/uL — ABNORMAL LOW (ref 150–400)

## 2012-10-06 LAB — POCT I-STAT TROPONIN I: Troponin i, poc: 0.01 ng/mL (ref 0.00–0.08)

## 2012-10-06 MED ORDER — ALBUTEROL SULFATE (5 MG/ML) 0.5% IN NEBU
2.5000 mg | INHALATION_SOLUTION | Freq: Once | RESPIRATORY_TRACT | Status: AC
  Administered 2012-10-07: 2.5 mg via RESPIRATORY_TRACT
  Filled 2012-10-06: qty 1

## 2012-10-06 MED ORDER — METHYLPREDNISOLONE SODIUM SUCC 125 MG IJ SOLR
125.0000 mg | Freq: Once | INTRAMUSCULAR | Status: AC
  Administered 2012-10-07: 125 mg via INTRAVENOUS
  Filled 2012-10-06: qty 2

## 2012-10-06 MED ORDER — SODIUM CHLORIDE 0.9 % IV BOLUS (SEPSIS)
1000.0000 mL | Freq: Once | INTRAVENOUS | Status: AC
  Administered 2012-10-06: 1000 mL via INTRAVENOUS

## 2012-10-06 NOTE — ED Notes (Signed)
Family at bedside. 

## 2012-10-06 NOTE — ED Provider Notes (Signed)
History     CSN: [id number]  Arrival date & time 10/06/12  2130   First MD Initiated Contact with Patient 10/06/12 2131      Chief Complaint  Patient presents with  . Cough    (Consider location/radiation/quality/duration/timing/severity/associated sxs/prior treatment) Patient is a 57 y.o. male presenting with cough.  Cough Cough characteristics:  Productive Sputum characteristics:  Green Severity:  Moderate Onset quality:  Gradual Duration:  2 days Timing:  Constant Progression:  Worsening Chronicity:  New Context: upper respiratory infection   Context: not animal exposure, not occupational exposure and not weather changes   Relieved by:  Nothing Worsened by:  Nothing tried Associated symptoms: chest pain and shortness of breath   Associated symptoms: no chills, no fever, no headaches, no myalgias, no rash, no rhinorrhea, no sore throat and no weight loss     Past Medical History  Diagnosis Date  . Hypertension   . Arthritis 04/04/2012  . Seizure 04/04/2012    POST CVA 12/2011  . RSD (reflex sympathetic dystrophy) 04/04/2012    LEFT HAND  . Dysphagia S/P CVA (cerebrovascular accident) 04/04/2012  . Aphasia 04/04/2012    HX OF POST cva  . Stroke 10/2010    LEFT [facility] cva ISCHEMIC  . 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 Chronic kidney disease   . Depression   . RSD (reflex sympathetic dystrophy) 2013    LEFT HAND    Past Surgical History  Procedure Laterality Date  . No past surgeries      No family history on file.  History  Substance Use Topics  . Smoking status: Former Smoker -- 30 years    Types: Cigarettes    Quit date: 04/18/1991  . Smokeless tobacco: Never Used  . Alcohol Use: Yes     Comment: OCCASSIONAL      Review of Systems  Constitutional: Negative for fever, chills and weight loss.  HENT: Negative for congestion, sore throat and rhinorrhea.   Eyes: Negative for photophobia and visual disturbance.   Respiratory: Positive for cough and shortness of breath.   Cardiovascular: Positive for chest pain. Negative for leg swelling.  Gastrointestinal: Negative for nausea, vomiting, abdominal pain, diarrhea and constipation.  Endocrine: Negative for polydipsia and polyuria.  Genitourinary: Negative for dysuria and hematuria.  Musculoskeletal: Negative for myalgias, back pain and arthralgias.  Skin: Negative for color change and rash.  Neurological: Negative for dizziness, syncope, light-headedness and headaches.  Hematological: Negative for adenopathy. Does not bruise/bleed easily.  All other systems reviewed and are negative.    Allergies  Review of patient's allergies indicates no known allergies.  Home Medications   Current Outpatient Rx  Name  Route  Sig  Dispense  Refill  . aspirin 325 MG tablet   Oral   Take 325 mg by mouth daily.         . citalopram (CELEXA) 20 MG tablet   Oral   Take 20 mg by mouth daily.         Marland Kitchen doxazosin (CARDURA) 8 MG tablet   Oral   Take 8 mg by mouth at bedtime.         . DULoxetine (CYMBALTA) 30 MG capsule   Oral   Take 30 mg by mouth daily.         . fluticasone (FLONASE) 50 MCG/ACT nasal spray   Nasal   Place 1 spray into the nose daily.         Marland Kitchen  gabapentin (NEURONTIN) 100 MG capsule   Oral   Take 100 mg by mouth 3 (three) times daily.         Marland Kitchen levETIRAcetam (KEPPRA) 500 MG tablet   Oral   Take 500 mg by mouth every 12 (twelve) hours.         . metoprolol tartrate (LOPRESSOR) 25 MG tablet   Oral   Take 25 mg by mouth 2 (two) times daily.         Marland Kitchen oxyCODONE-acetaminophen (PERCOCET/ROXICET) 5-325 MG per tablet   Oral   Take 1 tablet by mouth every 4 (four) hours as needed for pain.         . pantoprazole (PROTONIX) 20 MG tablet   Oral   Take 20 mg by mouth daily.         . traZODone (DESYREL) 50 MG tablet   Oral   Take 50 mg by mouth at bedtime.         Marland Kitchen amoxicillin-clavulanate (AUGMENTIN) 875-125  MG per tablet   Oral   Take 1 tablet by mouth every 12 (twelve) hours.   20 tablet   0   . doxycycline (VIBRAMYCIN) 100 MG capsule   Oral   Take 1 capsule (100 mg total) by mouth 2 (two) times daily.   20 capsule   0   . predniSONE (DELTASONE) 20 MG tablet   Oral   Take 2 tablets (40 mg total) by mouth daily.   8 tablet   0     BP 134/100  Pulse 60  Temp(Src) 97.9 F (36.6 C) (Oral)  Resp 15  SpO2 99%  Physical Exam  Vitals reviewed. Constitutional: He is oriented to person, place, and time. He appears well-developed and well-nourished.  HENT:  Head: Normocephalic and atraumatic.  Eyes: Conjunctivae and EOM are normal.  Neck: Normal range of motion. Neck supple.  Cardiovascular: Normal rate, regular rhythm and normal heart sounds.   Pulmonary/Chest: Effort normal and breath sounds normal. No respiratory distress.  Abdominal: He exhibits no distension. There is no tenderness. There is no rebound and no guarding.  Musculoskeletal: Normal range of motion.  Neurological: He is alert and oriented to person, place, and time.  Skin: Skin is warm and dry.    ED Course  Procedures (including critical care time)  Labs Reviewed  CBC WITH DIFFERENTIAL - Abnormal; Notable for the following:    WBC 3.7 (*)    RBC 4.21 (*)    Hemoglobin 12.4 (*)    HCT 36.4 (*)    Platelets 136 (*)    Neutrophils Relative % 33 (*)    Neutro Abs 1.2 (*)    Lymphocytes Relative 49 (*)    Eosinophils Relative 8 (*)    All other components within normal limits  COMPREHENSIVE METABOLIC PANEL - Abnormal; Notable for the following:    BUN 34 (*)    Creatinine, Ser 1.95 (*)    AST 83 (*)    ALT 223 (*)    Alkaline Phosphatase 157 (*)    GFR calc non Af Amer 37 (*)    GFR calc Af Amer 43 (*)    All other components within normal limits  PRO B NATRIURETIC PEPTIDE - Abnormal; Notable for the following:    Pro B Natriuretic peptide (BNP) 321.5 (*)    All other components within normal limits   URINE CULTURE  LACTIC ACID, PLASMA  URINALYSIS, ROUTINE W REFLEX MICROSCOPIC  POCT I-STAT TROPONIN I  POCT I-STAT  TROPONIN I   Dg Chest Portable 1 View  10/06/2012   *RADIOLOGY REPORT*  Clinical Data: Short of breath and cough  PORTABLE CHEST - 1 VIEW  Comparison: 04/04/2012  Findings: Cardiac enlargement without heart failure.  Mild atelectasis in the lung bases.  Negative for effusion or pneumonia.  IMPRESSION: Cardiac enlargement.  Mild bibasilar atelectasis.   Original Report Authenticated By: Janeece Riggers, M.D.     1. Chest pain   2. Cough      Date: 10/06/2012  Rate: 64  Rhythm: normal sinus rhythm  QRS Axis: left  Intervals: normal  ST/T Wave abnormalities: nonspecific ST changes  Conduction Disutrbances:none  Narrative Interpretation: NSR  Old EKG Reviewed: changes noted    MDM  57 y.o. 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  No measured fever, however pt has had continued diarrhea since admission two weeks ago, and productive cough.  At baseline pt has decreased mental status, intermittent confusion, and has had no change in mental status.  Vitals and physical exam today as above with bil rales and wheezing, no chest wall tenderness, persistent L sided weakness.  Pt given solumedrol, albuterol neb, with improvement and total relief of dyspnea and pleuritic chest pain.  Troponin negative, ecg as above without acute change from previous.  Labwork with mild elevation in LFT compared to previous, as well as mild cr elevation from baseline, however do not feel that pt warrants admission at this time.  Likely bronchitis, however pt with significant comorbidities and at high risk for aspiration PNA, however at this time pt without signs of PNA, so will treat with abx.  Given strict return precautions for changing or worsening symptoms,  voiced understanding, and agreed to fu.     Labs and imaging as above reviewed by myself and attending,Dr. Ranae Palms, with whom case was discussed.   1. Chest pain   2. Cough             Noel Gerold, MD 10/07/12 [phone]

## 2012-10-06 NOTE — ED Notes (Signed)
Per EMS: pt coming from home with c/o progressive cough for the past few days. Pt is bed bound due to past CVA's, pt has left sided paralysis. Pt is alert, speech is garbled per norm. Pt has rhonchi, productive cough. Pt c/o of chest pain with cough.

## 2012-10-07 ENCOUNTER — Telehealth (HOSPITAL_COMMUNITY): Payer: Self-pay | Admitting: Emergency Medicine

## 2012-10-07 MED ORDER — PREDNISONE 20 MG PO TABS: 40.0000 mg | ORAL_TABLET | Freq: Every day | ORAL | Status: DC

## 2012-10-07 MED ORDER — AMOXICILLIN-POT CLAVULANATE 875-125 MG PO TABS: 1.0000 | ORAL_TABLET | Freq: Two times a day (BID) | ORAL | Status: DC

## 2012-10-07 MED ORDER — DOXYCYCLINE HYCLATE 100 MG PO CAPS: 100.0000 mg | ORAL_CAPSULE | Freq: Two times a day (BID) | ORAL | Status: DC

## 2012-10-08 NOTE — ED Provider Notes (Signed)
I saw and evaluated the patient, reviewed the resident's note and I agree with the findings and plan. Pt with normal VS, baseline MS. Presents with cough. No PNA. Will treat for bronchitis.   Loren Racer, MD 10/08/12 2329

## 2012-10-10 ENCOUNTER — Encounter: Payer: Self-pay | Admitting: Physical Medicine & Rehabilitation

## 2012-10-10 ENCOUNTER — Encounter: Payer: Medicaid Other | Attending: Physical Medicine & Rehabilitation

## 2012-10-10 ENCOUNTER — Ambulatory Visit (HOSPITAL_BASED_OUTPATIENT_CLINIC_OR_DEPARTMENT_OTHER): Payer: Medicaid Other | Admitting: Physical Medicine & Rehabilitation

## 2012-10-10 VITALS — BP 106/64 | HR 74 | Resp 14 | Ht 71.0 in | Wt 191.8 lb

## 2012-10-10 DIAGNOSIS — G8110 Spastic hemiplegia affecting unspecified side: Secondary | ICD-10-CM

## 2012-10-10 DIAGNOSIS — R1310 Dysphagia, unspecified: Secondary | ICD-10-CM | POA: Insufficient documentation

## 2012-10-10 DIAGNOSIS — I6930 Unspecified sequelae of cerebral infarction: Secondary | ICD-10-CM

## 2012-10-10 DIAGNOSIS — Z8673 Personal history of transient ischemic attack (TIA), and cerebral infarction without residual deficits: Secondary | ICD-10-CM

## 2012-10-10 DIAGNOSIS — I69959 Hemiplegia and hemiparesis following unspecified cerebrovascular disease affecting unspecified side: Secondary | ICD-10-CM | POA: Insufficient documentation

## 2012-10-10 NOTE — Patient Instructions (Addendum)
Please call if spasms worsen or if patient develops increasing pain in the left hand or swelling in the left hand  Call Dr. Marletta Lor for any medical concerns

## 2012-10-10 NOTE — Progress Notes (Signed)
Subjective:    Patient ID: Collin Diaz, male    DOB: October 09, 1955, 57 y.o.   MRN: [id number]  HPI Hospitalized for dehydration and diarrhea. Poor appetite. Speech repeated modified barium swallow. No change compared to prior study. Recommend nectar thick liquids . Repeat head CT showed no new infarcts. Has had some diarrhea. Is on 2 different antibiotics. Receiving home health therapy.  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 Medications delivered to home.   Pain Inventory Average Pain 3 Pain Right Now 8 My pain is tingling  In the last 24 hours, has pain interfered with the following? General activity 3 Relation with others 3 Enjoyment of life 3 What TIME of day is your pain at its worst? night Sleep (in general) Good  Pain is worse with: some activites Pain improves with: medication Relief from Meds: 5  Mobility use a wheelchair needs help with transfers  Function disabled: date disabled . I need assistance with the following:  feeding, dressing, bathing, toileting, meal prep, household duties and shopping  Neuro/Psych bowel control problems weakness numbness tingling trouble walking dizziness depression anxiety  Prior Studies Any changes since last visit?  yes  ED over weekend for ?cold  Physicians involved in your care Any changes since last visit?  no   History reviewed. No pertinent family history. History   Social History  . Marital Status: Married    Spouse Name: N/A    Number of Children: N/A  . Years of Education: N/A   Social History Main Topics  . Smoking status: Former Smoker -- 30 years    Types: Cigarettes    Quit date: 04/18/1991  . Smokeless tobacco: Never Used  . Alcohol Use: Yes     Comment: OCCASSIONAL  . Drug Use: No  . Sexually Active:    Other Topics Concern  . None   Social History Narrative  . None   Past Surgical History  Procedure Laterality Date  . No past surgeries     Past Medical History   Diagnosis Date  . Hypertension   . Arthritis 04/04/2012  . Seizure 04/04/2012    POST CVA 12/2011  . RSD (reflex sympathetic dystrophy) 04/04/2012    LEFT HAND  . Dysphagia S/P CVA (cerebrovascular accident) 04/04/2012  . Aphasia 04/04/2012    HX OF POST cva  . Stroke 10/2010    LEFT [facility] cva ISCHEMIC  . 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 Chronic kidney disease   . Depression   . RSD (reflex sympathetic dystrophy) 2013    LEFT HAND   BP 106/64  Pulse 74  Resp 14  Ht 5\' 11"  (1.803 m)  Wt 191 lb 12.8 oz (87 kg)  BMI 26.76 kg/m2  SpO2 97%    Review of Systems  Constitutional: Positive for diaphoresis, appetite change and unexpected weight change.  Respiratory: Positive for apnea, cough, shortness of breath and wheezing.   Gastrointestinal: Positive for abdominal pain and diarrhea.  Musculoskeletal: Positive for gait problem.  Skin: Positive for rash.  Neurological: Positive for dizziness, weakness and numbness.       Tingling  Psychiatric/Behavioral: Positive for dysphoric mood. The patient is nervous/anxious.   All other systems reviewed and are negative.       Objective:   Physical Exam Motor strength is 0/5 in the left deltoid, biceps, triceps, grip Ashworth grade 1 spasticity left hand in the finger and wrist flexors. Left lower extremity trace hip extension. Otherwise  0. No evidence of clonus at the ankle. There is 3+ reflex at the left knee. Right upper and right lower extremity 4/5. There is evidence of left neglect. Left hand shows no swelling no tenderness to palpation. No pain with range of motion. Left lower extremity no pain with range of motion swelling. Mild calf tenderness but soft no cords palpable.      Assessment & Plan:  1. 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 His spasticity has actually improved. I do not see a need for Botox or oral spasticity medicines.  His stroke deficits are stable. He is  receiving some home health therapy after his recent hospitalization. I don't expect any further functional improvement given the chronicity of his stroke. I discussed with the patient and his wife. Return to clinic on a when necessary basis His PCP will see him on July 1  2. RSD by history no current signs of this. This appears to have resolved.

## 2012-10-17 ENCOUNTER — Emergency Department (HOSPITAL_COMMUNITY)
Admission: EM | Admit: 2012-10-17 | Discharge: 2012-10-18 | Disposition: A | Discharge status: Home / Self Care | Payer: Medicaid Other | Attending: Emergency Medicine | Admitting: Emergency Medicine

## 2012-10-17 ENCOUNTER — Encounter (HOSPITAL_COMMUNITY): Payer: Self-pay | Admitting: Emergency Medicine

## 2012-10-17 ENCOUNTER — Emergency Department (HOSPITAL_COMMUNITY): Payer: Medicaid Other

## 2012-10-17 DIAGNOSIS — Z7982 Long term (current) use of aspirin: Secondary | ICD-10-CM | POA: Insufficient documentation

## 2012-10-17 DIAGNOSIS — F329 Major depressive disorder, single episode, unspecified: Secondary | ICD-10-CM | POA: Insufficient documentation

## 2012-10-17 DIAGNOSIS — F3289 Other specified depressive episodes: Secondary | ICD-10-CM | POA: Insufficient documentation

## 2012-10-17 DIAGNOSIS — Z8669 Personal history of other diseases of the nervous system and sense organs: Secondary | ICD-10-CM | POA: Insufficient documentation

## 2012-10-17 DIAGNOSIS — N189 Chronic kidney disease, unspecified: Secondary | ICD-10-CM | POA: Insufficient documentation

## 2012-10-17 DIAGNOSIS — Z8673 Personal history of transient ischemic attack (TIA), and cerebral infarction without residual deficits: Secondary | ICD-10-CM | POA: Insufficient documentation

## 2012-10-17 DIAGNOSIS — G40909 Epilepsy, unspecified, not intractable, without status epilepticus: Secondary | ICD-10-CM | POA: Insufficient documentation

## 2012-10-17 DIAGNOSIS — Z87891 Personal history of nicotine dependence: Secondary | ICD-10-CM | POA: Insufficient documentation

## 2012-10-17 DIAGNOSIS — M129 Arthropathy, unspecified: Secondary | ICD-10-CM | POA: Insufficient documentation

## 2012-10-17 DIAGNOSIS — R51 Headache: Secondary | ICD-10-CM | POA: Insufficient documentation

## 2012-10-17 DIAGNOSIS — Z79899 Other long term (current) drug therapy: Secondary | ICD-10-CM | POA: Insufficient documentation

## 2012-10-17 DIAGNOSIS — I129 Hypertensive chronic kidney disease with stage 1 through stage 4 chronic kidney disease, or unspecified chronic kidney disease: Secondary | ICD-10-CM | POA: Insufficient documentation

## 2012-10-17 LAB — CBC WITH DIFFERENTIAL/PLATELET
Eosinophils Relative: 5 % (ref 0–5)
HCT: 37.8 % — ABNORMAL LOW (ref 39.0–52.0)
Lymphs Abs: 2.4 10*3/uL (ref 0.7–4.0)
MCH: 29.5 pg (ref 26.0–34.0)
MCV: 87.7 fL (ref 78.0–100.0)
Monocytes Absolute: 0.6 10*3/uL (ref 0.1–1.0)
Monocytes Relative: 12 % (ref 3–12)
Neutro Abs: 1.6 10*3/uL — ABNORMAL LOW (ref 1.7–7.7)
Platelets: 108 10*3/uL — ABNORMAL LOW (ref 150–400)
RBC: 4.31 MIL/uL (ref 4.22–5.81)
WBC: 4.8 10*3/uL (ref 4.0–10.5)

## 2012-10-17 LAB — BASIC METABOLIC PANEL
CO2: 20 mEq/L (ref 19–32)
Calcium: 9.7 mg/dL (ref 8.4–10.5)
Chloride: 108 mEq/L (ref 96–112)
Creatinine, Ser: 2.31 mg/dL — ABNORMAL HIGH (ref 0.50–1.35)
Glucose, Bld: 88 mg/dL (ref 70–99)

## 2012-10-17 MED ORDER — METOCLOPRAMIDE HCL 5 MG/ML IJ SOLN
10.0000 mg | Freq: Once | INTRAMUSCULAR | Status: AC
  Administered 2012-10-17: 10 mg via INTRAVENOUS
  Filled 2012-10-17: qty 2

## 2012-10-17 MED ORDER — SODIUM CHLORIDE 0.9 % IV BOLUS (SEPSIS)
500.0000 mL | Freq: Once | INTRAVENOUS | Status: AC
  Administered 2012-10-17: 500 mL via INTRAVENOUS

## 2012-10-17 MED ORDER — DIPHENHYDRAMINE HCL 50 MG/ML IJ SOLN
25.0000 mg | Freq: Once | INTRAMUSCULAR | Status: AC
  Administered 2012-10-17: 25 mg via INTRAVENOUS
  Filled 2012-10-17: qty 1

## 2012-10-17 NOTE — ED Notes (Signed)
Pt states started having a headache "all over" this morning around 0800, denies n/v, denies blurred vision.

## 2012-10-17 NOTE — ED Provider Notes (Signed)
History    CSN: [id number] Arrival date & time 10/17/12  1935  First MD Initiated Contact with Patient 10/17/12 2000     Chief Complaint  Patient presents with  . Headache   (Consider location/radiation/quality/duration/timing/severity/associated sxs/prior Treatment) HPI Comments: Pt w/ hx of strokes comes in with cc of headaches. Pt has hx of strokes, started having headaches this morning. No hx of prior headaches, no meds taken. The headaches are anterior, constant, non radiating, with no nausea, vomiting, visual complains, seizures, altered mental status, loss of consciousness, new weakness, or numbness, no gait instability. Pt has no eye pain, neck pain, stiffness, fevers, chills.   Patient is a 57 y.o. male presenting with headaches. The history is provided by the patient.  Headache Associated symptoms: no abdominal pain, no cough, no dizziness, no pain, no neck pain, no neck stiffness, no photophobia and no seizures    Past Medical History  Diagnosis Date  . Hypertension   . Arthritis 04/04/2012  . Seizure 04/04/2012    POST CVA 12/2011  . RSD (reflex sympathetic dystrophy) 04/04/2012    LEFT HAND  . Dysphagia S/P CVA (cerebrovascular accident) 04/04/2012  . Aphasia 04/04/2012    HX OF POST cva  . Stroke 10/2010    LEFT [facility] cva ISCHEMIC  . 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 Chronic kidney disease   . Depression   . RSD (reflex sympathetic dystrophy) 2013    LEFT HAND   Past Surgical History  Procedure Laterality Date  . No past surgeries     No family history on file. History  Substance Use Topics  . Smoking status: Former Smoker -- 30 years    Types: Cigarettes    Quit date: 04/18/1991  . Smokeless tobacco: Never Used  . Alcohol Use: Yes     Comment: OCCASSIONAL    Review of Systems  Constitutional: Negative for activity change and appetite change.  HENT: Negative for neck pain and neck stiffness.   Eyes: Negative for  photophobia, pain and visual disturbance.  Respiratory: Negative for cough and shortness of breath.   Cardiovascular: Negative for chest pain.  Gastrointestinal: Negative for abdominal pain.  Genitourinary: Negative for dysuria.  Neurological: Positive for headaches. Negative for dizziness, seizures, speech difficulty and light-headedness.    Allergies  Review of patient's allergies indicates no known allergies.  Home Medications   Current Outpatient Rx  Name  Route  Sig  Dispense  Refill  . allopurinol (ZYLOPRIM) 100 MG tablet   Oral   Take 100 mg by mouth 2 (two) times daily.         Marland Kitchen amoxicillin-clavulanate (AUGMENTIN) 875-125 MG per tablet   Oral   Take 1 tablet by mouth 2 (two) times daily.         Marland Kitchen aspirin 325 MG tablet   Oral   Take 325 mg by mouth daily.         . citalopram (CELEXA) 20 MG tablet   Oral   Take 20 mg by mouth every morning.          . colchicine 0.6 MG tablet   Oral   Take 0.6 mg by mouth 2 (two) times daily.         Marland Kitchen doxazosin (CARDURA) 8 MG tablet   Oral   Take 8 mg by mouth at bedtime.         Marland Kitchen doxycycline (VIBRAMYCIN) 100 MG capsule   Oral  Take 100 mg by mouth 2 (two) times daily.         . fluticasone (FLONASE) 50 MCG/ACT nasal spray   Nasal   Place 1 spray into the nose daily.         . hydrALAZINE (APRESOLINE) 10 MG tablet   Oral   Take 10 mg by mouth 2 (two) times daily.         Marland Kitchen levETIRAcetam (KEPPRA) 500 MG tablet   Oral   Take 500 mg by mouth every 12 (twelve) hours.         Marland Kitchen lisinopril (PRINIVIL,ZESTRIL) 20 MG tablet   Oral   Take 20 mg by mouth every morning.         . metoprolol tartrate (LOPRESSOR) 25 MG tablet   Oral   Take 25 mg by mouth 2 (two) times daily.         Marland Kitchen oxyCODONE-acetaminophen (PERCOCET/ROXICET) 5-325 MG per tablet   Oral   Take 1 tablet by mouth every 4 (four) hours as needed for pain.         . pantoprazole (PROTONIX) 20 MG tablet   Oral   Take 20 mg by  mouth daily.         . traZODone (DESYREL) 50 MG tablet   Oral   Take 50 mg by mouth at bedtime.          BP 106/82  Pulse 63  Temp(Src) 98.4 F (36.9 C) (Oral)  Resp 18  SpO2 100% Physical Exam  Nursing note and vitals reviewed. Constitutional: He is oriented to person, place, and time. He appears well-developed.  HENT:  Head: Normocephalic and atraumatic.  Eyes: Conjunctivae and EOM are normal. Pupils are equal, round, and reactive to light.  Neck: Normal range of motion. Neck supple.  Cardiovascular: Normal rate and regular rhythm.   Pulmonary/Chest: Effort normal and breath sounds normal.  Abdominal: Soft. Bowel sounds are normal. He exhibits no distension. There is no tenderness. There is no rebound and no guarding.  Neurological: He is alert and oriented to person, place, and time.  LUE weakness Cerebellar exam is normal right side (finger to nose) Sensory exam normal for bilateral upper and lower extremities - and patient is able to discriminate between sharp and dull. Motor exam is 4+/5 for the right side, 2/5 left side.   Skin: Skin is warm.    ED Course  Procedures (including critical care time) Labs Reviewed  BASIC METABOLIC PANEL  CBC WITH DIFFERENTIAL   No results found. No diagnosis found.  MDM  DDX includes: Primary headaches - including migrainous headaches, cluster headaches, tension headaches. ICH Carotid dissection Cavernous sinus thrombosis Meningitis Encephalitis Sinusitis Tumor Vascular headaches AV malformation Brain aneurysm Muscular headaches  A/P: Pt comes in with cc of headaches. No concerns for life threatening secondary headaches because the headaches have no associated red flags and there is no hx of trauma, or any new neuro deficits on exam.  We will get CT head to r/o tumors. Headache meds prescribed.  Basic labs ordered.  Derwood Kaplan, MD 10/17/12 2131

## 2012-10-17 NOTE — ED Notes (Signed)
Patient transported to CT 

## 2012-10-17 NOTE — ED Notes (Signed)
Pt reports a headache that started at about 0800. Pt reports the pain is located anterior and is 9/10. Pt has a history of stroke. Per Family pt had three strokes last year: October 21, 2011, December 30, 2011. And April 04, 2012. Pt has a baseline of left sided paralysis from the prior strokes. Pt denies any changes in the right side of his body.

## 2012-10-18 MED ORDER — ACETAMINOPHEN-CODEINE #3 300-30 MG PO TABS
1.0000 | ORAL_TABLET | Freq: Four times a day (QID) | ORAL | Status: DC | PRN
Start: 2012-10-18 — End: 2013-01-26

## 2012-10-18 NOTE — ED Notes (Signed)
ptar called to transport pt home 

## 2012-10-28 ENCOUNTER — Observation Stay (HOSPITAL_COMMUNITY)
Admission: EM | Admit: 2012-10-28 | Discharge: 2012-10-31 | Disposition: A | Discharge status: Home / Self Care | Payer: Medicaid Other | Attending: Internal Medicine | Admitting: Internal Medicine

## 2012-10-28 ENCOUNTER — Emergency Department (HOSPITAL_COMMUNITY): Payer: Medicaid Other

## 2012-10-28 ENCOUNTER — Encounter (HOSPITAL_COMMUNITY): Payer: Self-pay | Admitting: Nurse Practitioner

## 2012-10-28 DIAGNOSIS — Z79899 Other long term (current) drug therapy: Secondary | ICD-10-CM | POA: Insufficient documentation

## 2012-10-28 DIAGNOSIS — I6350 Cerebral infarction due to unspecified occlusion or stenosis of unspecified cerebral artery: Secondary | ICD-10-CM

## 2012-10-28 DIAGNOSIS — R55 Syncope and collapse: Principal | ICD-10-CM | POA: Diagnosis present

## 2012-10-28 DIAGNOSIS — E875 Hyperkalemia: Secondary | ICD-10-CM | POA: Diagnosis present

## 2012-10-28 DIAGNOSIS — E66811 Obesity, class 1: Secondary | ICD-10-CM

## 2012-10-28 DIAGNOSIS — E669 Obesity, unspecified: Secondary | ICD-10-CM

## 2012-10-28 DIAGNOSIS — R4701 Aphasia: Secondary | ICD-10-CM

## 2012-10-28 DIAGNOSIS — I120 Hypertensive chronic kidney disease with stage 5 chronic kidney disease or end stage renal disease: Secondary | ICD-10-CM | POA: Insufficient documentation

## 2012-10-28 DIAGNOSIS — I69320 Aphasia following cerebral infarction: Secondary | ICD-10-CM

## 2012-10-28 DIAGNOSIS — Z8673 Personal history of transient ischemic attack (TIA), and cerebral infarction without residual deficits: Secondary | ICD-10-CM

## 2012-10-28 DIAGNOSIS — I639 Cerebral infarction, unspecified: Secondary | ICD-10-CM

## 2012-10-28 DIAGNOSIS — N179 Acute kidney failure, unspecified: Secondary | ICD-10-CM

## 2012-10-28 DIAGNOSIS — I69959 Hemiplegia and hemiparesis following unspecified cerebrovascular disease affecting unspecified side: Secondary | ICD-10-CM | POA: Insufficient documentation

## 2012-10-28 DIAGNOSIS — I10 Essential (primary) hypertension: Secondary | ICD-10-CM

## 2012-10-28 DIAGNOSIS — N189 Chronic kidney disease, unspecified: Secondary | ICD-10-CM

## 2012-10-28 DIAGNOSIS — I69391 Dysphagia following cerebral infarction: Secondary | ICD-10-CM

## 2012-10-28 DIAGNOSIS — I69920 Aphasia following unspecified cerebrovascular disease: Secondary | ICD-10-CM | POA: Insufficient documentation

## 2012-10-28 DIAGNOSIS — G8190 Hemiplegia, unspecified affecting unspecified side: Secondary | ICD-10-CM | POA: Diagnosis present

## 2012-10-28 DIAGNOSIS — I6930 Unspecified sequelae of cerebral infarction: Secondary | ICD-10-CM

## 2012-10-28 DIAGNOSIS — N185 Chronic kidney disease, stage 5: Secondary | ICD-10-CM | POA: Diagnosis present

## 2012-10-28 LAB — POCT I-STAT, CHEM 8
Glucose, Bld: 89 mg/dL (ref 70–99)
HCT: 39 % (ref 39.0–52.0)
Hemoglobin: 13.3 g/dL (ref 13.0–17.0)
Potassium: 6 mEq/L — ABNORMAL HIGH (ref 3.5–5.1)
Sodium: 142 mEq/L (ref 135–145)

## 2012-10-28 LAB — CBC WITH DIFFERENTIAL/PLATELET
Basophils Absolute: 0 10*3/uL (ref 0.0–0.1)
Basophils Relative: 1 % (ref 0–1)
Eosinophils Absolute: 0.2 10*3/uL (ref 0.0–0.7)
Eosinophils Relative: 7 % — ABNORMAL HIGH (ref 0–5)
HCT: 35.2 % — ABNORMAL LOW (ref 39.0–52.0)
Hemoglobin: 11.8 g/dL — ABNORMAL LOW (ref 13.0–17.0)
Lymphocytes Relative: 38 % (ref 12–46)
Lymphs Abs: 1.1 10*3/uL (ref 0.7–4.0)
MCH: 29.6 pg (ref 26.0–34.0)
MCHC: 33.5 g/dL (ref 30.0–36.0)
MCV: 88.4 fL (ref 78.0–100.0)
Monocytes Absolute: 0.3 10*3/uL (ref 0.1–1.0)
Monocytes Relative: 11 % (ref 3–12)
Neutro Abs: 1.2 10*3/uL — ABNORMAL LOW (ref 1.7–7.7)
Neutrophils Relative %: 43 % (ref 43–77)
Platelets: 104 10*3/uL — ABNORMAL LOW (ref 150–400)
RBC: 3.98 MIL/uL — ABNORMAL LOW (ref 4.22–5.81)
RDW: 15.5 % (ref 11.5–15.5)
WBC: 2.9 10*3/uL — ABNORMAL LOW (ref 4.0–10.5)

## 2012-10-28 LAB — URINALYSIS, ROUTINE W REFLEX MICROSCOPIC
Bilirubin Urine: NEGATIVE
Glucose, UA: NEGATIVE mg/dL
Hgb urine dipstick: NEGATIVE
Ketones, ur: NEGATIVE mg/dL
Leukocytes, UA: NEGATIVE
Nitrite: NEGATIVE
Protein, ur: NEGATIVE mg/dL
Specific Gravity, Urine: 1.013 (ref 1.005–1.030)
Urobilinogen, UA: 0.2 mg/dL (ref 0.0–1.0)
pH: 5 (ref 5.0–8.0)

## 2012-10-28 LAB — BASIC METABOLIC PANEL
BUN: 40 mg/dL — ABNORMAL HIGH (ref 6–23)
CO2: 22 mEq/L (ref 19–32)
Calcium: 9.9 mg/dL (ref 8.4–10.5)
Chloride: 109 mEq/L (ref 96–112)
Creatinine, Ser: 2.86 mg/dL — ABNORMAL HIGH (ref 0.50–1.35)
Glucose, Bld: 92 mg/dL (ref 70–99)

## 2012-10-28 LAB — GLUCOSE, CAPILLARY: Glucose-Capillary: 82 mg/dL (ref 70–99)

## 2012-10-28 LAB — POCT I-STAT TROPONIN I

## 2012-10-28 MED ORDER — SODIUM POLYSTYRENE SULFONATE 15 GM/60ML PO SUSP
45.0000 g | Freq: Once | ORAL | Status: AC
  Administered 2012-10-28: 45 g via ORAL
  Filled 2012-10-28: qty 180

## 2012-10-28 MED ORDER — INSULIN ASPART 100 UNIT/ML ~~LOC~~ SOLN
4.0000 [IU] | Freq: Once | SUBCUTANEOUS | Status: AC
  Administered 2012-10-28: 4 [IU] via INTRAVENOUS
  Filled 2012-10-28: qty 1

## 2012-10-28 MED ORDER — ALBUTEROL SULFATE (5 MG/ML) 0.5% IN NEBU
5.0000 mg | INHALATION_SOLUTION | Freq: Once | RESPIRATORY_TRACT | Status: AC
  Administered 2012-10-28: 5 mg via RESPIRATORY_TRACT
  Filled 2012-10-28 (×2): qty 1

## 2012-10-28 MED ORDER — DEXTROSE 50 % IV SOLN
1.0000 | Freq: Once | INTRAVENOUS | Status: AC
  Administered 2012-10-28: 50 mL via INTRAVENOUS
  Filled 2012-10-28: qty 50

## 2012-10-28 NOTE — ED Provider Notes (Signed)
History    CSN: [id number] Arrival date & time 10/28/12  1709  First MD Initiated Contact with Patient 10/28/12 1711     Chief Complaint  Patient presents with  . Near Syncope   (Consider location/radiation/quality/duration/timing/severity/associated sxs/prior Treatment) HPI Patient presents to the emergency department with his wife, who states, that the patient lost consciousness 30 minutes prior to arrival. the patient's wife gives the history. patient has had multiple strokes and is bedbound.wife states, that he just finished using the bathroom, and she cleaned him.  She had been do something came back a few minutes later and the patient was not responsive and drooling.  Wife states, that he would aroused shortly, thereafter patient denies any chest pain, shortness of breath, nausea, vomiting, dizziness, blurred vision, or headache Past Medical History  Diagnosis Date  . Hypertension   . Arthritis 04/04/2012  . Seizure 04/04/2012    POST CVA 12/2011  . RSD (reflex sympathetic dystrophy) 04/04/2012    LEFT HAND  . Dysphagia S/P CVA (cerebrovascular accident) 04/04/2012  . Aphasia 04/04/2012    HX OF POST cva  . Stroke 10/2010    LEFT mca cva ISCHEMIC  . 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 Chronic kidney disease   . Depression   . RSD (reflex sympathetic dystrophy) 2013    LEFT HAND   Past Surgical History  Procedure Laterality Date  . No past surgeries     History reviewed. No pertinent family history. History  Substance Use Topics  . Smoking status: Former Smoker -- 30 years    Types: Cigarettes    Quit date: 04/18/1991  . Smokeless tobacco: Never Used  . Alcohol Use: Yes     Comment: OCCASSIONAL    Review of Systems All other systems negative except as documented in the HPI. All pertinent positives and negatives as reviewed in the HPI.   Allergies  Review of patient's allergies indicates no known allergies.  Home Medications    Current Outpatient Rx  Name  Route  Sig  Dispense  Refill  . acetaminophen-codeine (TYLENOL #3) 300-30 MG per tablet   Oral   Take 1-2 tablets by mouth every 6 (six) hours as needed for pain.   15 tablet   0   . allopurinol (ZYLOPRIM) 100 MG tablet   Oral   Take 100 mg by mouth 2 (two) times daily.         Marland Kitchen aspirin 325 MG tablet   Oral   Take 325 mg by mouth daily.         . citalopram (CELEXA) 20 MG tablet   Oral   Take 20 mg by mouth every morning.          . colchicine 0.6 MG tablet   Oral   Take 0.6 mg by mouth 2 (two) times daily.         Marland Kitchen doxazosin (CARDURA) 8 MG tablet   Oral   Take 8 mg by mouth at bedtime.         . fluticasone (FLONASE) 50 MCG/ACT nasal spray   Nasal   Place 1 spray into the nose daily.         . hydrALAZINE (APRESOLINE) 10 MG tablet   Oral   Take 10 mg by mouth 2 (two) times daily.         Marland Kitchen levETIRAcetam (KEPPRA) 500 MG tablet   Oral   Take 500 mg by mouth every  12 (twelve) hours.         Marland Kitchen lisinopril (PRINIVIL,ZESTRIL) 20 MG tablet   Oral   Take 20 mg by mouth every morning.         . metoprolol tartrate (LOPRESSOR) 25 MG tablet   Oral   Take 25 mg by mouth 2 (two) times daily.         Marland Kitchen oxyCODONE-acetaminophen (PERCOCET/ROXICET) 5-325 MG per tablet   Oral   Take 1 tablet by mouth every 4 (four) hours as needed for pain.         . pantoprazole (PROTONIX) 20 MG tablet   Oral   Take 20 mg by mouth daily.         . tamsulosin (FLOMAX) 0.4 MG CAPS   Oral   Take 0.4 mg by mouth daily.         Marland Kitchen topiramate (TOPAMAX) 25 MG tablet   Oral   Take 25 mg by mouth at bedtime.         . traZODone (DESYREL) 50 MG tablet   Oral   Take 50 mg by mouth at bedtime.          BP 114/84  Pulse 81  Temp(Src) 98.9 F (37.2 C) (Rectal)  Resp 16  SpO2 97% Physical Exam  Nursing note and vitals reviewed. Constitutional: He appears well-developed and well-nourished. No distress.  HENT:  Head:  Normocephalic and atraumatic.  Mouth/Throat: Oropharynx is clear and moist.  Eyes: Pupils are equal, round, and reactive to light.  Neck: Normal range of motion. Neck supple.  Cardiovascular: Normal rate, regular rhythm and normal heart sounds.  Exam reveals no gallop and no friction rub.   No murmur heard. Pulmonary/Chest: Effort normal and breath sounds normal. No respiratory distress.  Abdominal: Soft. Bowel sounds are normal. He exhibits no distension. There is no tenderness.  Neurological: He is alert. He exhibits normal muscle tone. Coordination normal.  Skin: Skin is warm and dry. No rash noted.    ED Course  Procedures (including critical care time) Labs Reviewed  BASIC METABOLIC PANEL - Abnormal; Notable for the following:    Potassium 6.0 (*)    BUN 40 (*)    Creatinine, Ser 2.86 (*)    GFR calc non Af Amer 23 (*)    GFR calc Af Amer 27 (*)    All other components within normal limits  CBC WITH DIFFERENTIAL - Abnormal; Notable for the following:    WBC 2.9 (*)    RBC 3.98 (*)    Hemoglobin 11.8 (*)    HCT 35.2 (*)    Platelets 104 (*)    Neutro Abs 1.2 (*)    Eosinophils Relative 7 (*)    All other components within normal limits  URINALYSIS, ROUTINE W REFLEX MICROSCOPIC - Abnormal; Notable for the following:    Color, Urine AMBER (*)    All other components within normal limits  POCT I-STAT, CHEM 8 - Abnormal; Notable for the following:    Potassium 6.0 (*)    Chloride 115 (*)    BUN 40 (*)    Creatinine, Ser 2.70 (*)    Calcium, Ion 1.41 (*)    All other components within normal limits  GLUCOSE, CAPILLARY  CG4 I-STAT (LACTIC ACID)  POCT I-STAT TROPONIN I   Ct Head Wo Contrast  10/28/2012   *RADIOLOGY REPORT*  Clinical Data: Alert but not responding.  Lethargic.  CT HEAD WITHOUT CONTRAST  Technique:  Contiguous axial images were obtained  from the base of the skull through the vertex without contrast.  Comparison: 10/17/2012.  Findings:  Calvarium: No acute  osseous abnormality.  No lytic or blastic lesion.  Orbits: No acute abnormality.  Brain: No evidence of acute abnormality, including acute large territory infarction, hemorrhage, hydrocephalus, or mass lesion/mass effect.Extensive, confluent bilateral cerebral white matter low density, consist with chronic small vessel ischemic injury.  Multiple perforator infarcts in the bilateral lenticulostriate distributions, affecting the deep gray nuclei and neighboring white matter tracts.  IMPRESSION: 1.  No evidence of acute hemorrhage or large vessel infarct.   2.  Extensive chronic small vessel ischemic injury with remote perforator infarctions.   Original Report Authenticated By: Tiburcio Pea   Dg Chest Port 1 View  10/28/2012   *RADIOLOGY REPORT*  Clinical Data: Syncope, shortness of breath  PORTABLE CHEST - 1 VIEW  Comparison: 10/06/2012  Findings: Heart size upper limits normal.  Lungs clear.  No effusion.  No pneumothorax.  Regional bones unremarkable.  IMPRESSION:  Negative   Original Report Authenticated By: D. Andria Rhein, MD   1. Syncope   2. Hyperkalemia   3. Aphasia   4. Chronic kidney disease, stage V   5. CVA (cerebral infarction)   6. Hemiplegia, unspecified, affecting dominant side   7. Malignant hypertension     MDM  MDM Reviewed: nursing note, vitals and previous chart Reviewed previous: labs Interpretation: labs, ECG and CT scan Consults: admitting MD   Date: 10/29/2012  Rate: 66  Rhythm: normal sinus rhythm  QRS Axis: normal  Intervals: normal  ST/T Wave abnormalities: normal  Conduction Disutrbances:none  Narrative Interpretation:   Old EKG Reviewed: unchanged      Carlyle Dolly, PA-C 10/29/12 0159

## 2012-10-28 NOTE — ED Notes (Signed)
Pt given urinal for sample.  sts is unable to pee at this time but will try shortly.

## 2012-10-28 NOTE — ED Notes (Signed)
Report given to Cindy, rn

## 2012-10-28 NOTE — ED Notes (Signed)
Per ems: wife called after finding pt slumped over on toilet, alert but not responding. On ems arrival, pt with eyes open, responsive to pain but nonverbal. BP found low. ems initiated IV and started IVF. Pt received approx 200 cc NS en route, pt began to answer questions and speak to ems. Pt with L side paralysis from prior stroke. Pt denies pain or any other complaints.

## 2012-10-28 NOTE — H&P (Signed)
PCP:   Marletta Lor, NP   Chief Complaint:  Passed out  HPI: 57 yo male h/o multiple cva in past with residual left sided hemiparesis, seizure in the past, htn, ckd comes in with EMS today after having a syncopal event after having a bowel movement on the toilet.  He lives at home with his wife, the wife had him on the toilet, he had just had a bm, she left him on the toilet for a few minutes when she arrived back she found him slumped over on toilet passed out.  He quickly came too, and was back to normal his baseline.  EMS was called, on their arrival his bp was 92/63.  Wife reports his bp has been running low lately.  Pt denies any cp, no fevers.  No recent n/v/d.  No abd pain, no le edema or swelling.  Back to his baseline neurological status.  No sob.  Did not fall off the toilet, no head injury.  Review of Systems:  Positive and negative as per HPI otherwise all other systems are negative  Past Medical History: Past Medical History  Diagnosis Date  . Hypertension   . Arthritis 04/04/2012  . Seizure 04/04/2012    POST CVA 12/2011  . RSD (reflex sympathetic dystrophy) 04/04/2012    LEFT HAND  . Dysphagia S/P CVA (cerebrovascular accident) 04/04/2012  . Aphasia 04/04/2012    HX OF POST cva  . Stroke 10/2010    LEFT mca cva ISCHEMIC  . 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 Chronic kidney disease   . Depression   . RSD (reflex sympathetic dystrophy) 2013    LEFT HAND   Past Surgical History  Procedure Laterality Date  . No past surgeries      Medications: Prior to Admission medications   Medication Sig Start Date End Date Taking? Authorizing Provider  acetaminophen-codeine (TYLENOL #3) 300-30 MG per tablet Take 1-2 tablets by mouth every 6 (six) hours as needed for pain. 10/18/12  Yes Derwood Kaplan, MD  allopurinol (ZYLOPRIM) 100 MG tablet Take 100 mg by mouth 2 (two) times daily.   Yes Historical Provider, MD  aspirin 325 MG tablet Take 325 mg by  mouth daily.   Yes Historical Provider, MD  citalopram (CELEXA) 20 MG tablet Take 20 mg by mouth every morning.    Yes Historical Provider, MD  colchicine 0.6 MG tablet Take 0.6 mg by mouth 2 (two) times daily.   Yes Historical Provider, MD  doxazosin (CARDURA) 8 MG tablet Take 8 mg by mouth at bedtime.   Yes Historical Provider, MD  fluticasone (FLONASE) 50 MCG/ACT nasal spray Place 1 spray into the nose daily.   Yes Historical Provider, MD  hydrALAZINE (APRESOLINE) 10 MG tablet Take 10 mg by mouth 2 (two) times daily.   Yes Historical Provider, MD  levETIRAcetam (KEPPRA) 500 MG tablet Take 500 mg by mouth every 12 (twelve) hours.   Yes Historical Provider, MD  lisinopril (PRINIVIL,ZESTRIL) 20 MG tablet Take 20 mg by mouth every morning.   Yes Historical Provider, MD  metoprolol tartrate (LOPRESSOR) 25 MG tablet Take 25 mg by mouth 2 (two) times daily.   Yes Historical Provider, MD  oxyCODONE-acetaminophen (PERCOCET/ROXICET) 5-325 MG per tablet Take 1 tablet by mouth every 4 (four) hours as needed for pain.   Yes Historical Provider, MD  pantoprazole (PROTONIX) 20 MG tablet Take 20 mg by mouth daily.   Yes Historical Provider, MD  tamsulosin (FLOMAX) 0.4 MG CAPS Take 0.4 mg by mouth daily.   Yes Historical Provider, MD  topiramate (TOPAMAX) 25 MG tablet Take 25 mg by mouth at bedtime.   Yes Historical Provider, MD  traZODone (DESYREL) 50 MG tablet Take 50 mg by mouth at bedtime.   Yes Historical Provider, MD    Allergies:  No Known Allergies  Social History:  reports that he quit smoking about 21 years ago. His smoking use included Cigarettes. He smoked 0.00 packs per day for 30 years. He has never used smokeless tobacco. He reports that  drinks alcohol. He reports that he does not use illicit drugs.  Family History: History reviewed. No pertinent family history.  Physical Exam: Filed Vitals:   10/28/12 1735 10/28/12 1831 10/28/12 2025 10/28/12 2051  BP: 110/80 117/81 129/91   Pulse: 65  69 64 78  Temp:  98.9 F (37.2 C)    TempSrc:  Rectal    Resp: 20 16 14 16   SpO2: 100% 100% 100% 100%   General appearance: alert, cooperative and no distress Head: Normocephalic, without obvious abnormality, atraumatic Eyes: negative Nose: Nares normal. Septum midline. Mucosa normal. No drainage or sinus tenderness. Neck: no JVD and supple, symmetrical, trachea midline Lungs: clear to auscultation bilaterally Heart: regular rate and rhythm, S1, S2 normal, no murmur, click, rub or gallop Abdomen: soft, non-tender; bowel sounds normal; no masses,  no organomegaly Extremities: extremities normal, atraumatic, no cyanosis or edema Pulses: 2+ and symmetric Skin: Skin color, texture, turgor normal. No rashes or lesions Neurologic: Mental status: Alert, oriented, thought content appropriate left sided hemiparesis    Labs on Admission:   Recent Labs  10/28/12 1802 10/28/12 1834  NA 140 142  K 6.0* 6.0*  CL 109 115*  CO2 22  --   GLUCOSE 92 89  BUN 40* 40*  CREATININE 2.86* 2.70*  CALCIUM 9.9  --     Recent Labs  10/28/12 1807 10/28/12 1834  WBC 2.9*  --   NEUTROABS 1.2*  --   HGB 11.8* 13.3  HCT 35.2* 39.0  MCV 88.4  --   PLT 104*  --     Radiological Exams on Admission:  Dg Chest Port 1 View  10/28/2012   *RADIOLOGY REPORT*  Clinical Data: Syncope, shortness of breath  PORTABLE CHEST - 1 VIEW  Comparison: 10/06/2012  Findings: Heart size upper limits normal.  Lungs clear.  No effusion.  No pneumothorax.  Regional bones unremarkable.  IMPRESSION:  Negative   Original Report Authenticated By: D. Andria Rhein, MD   Assessment/Plan  57 yo male with syncopal event with multiple cva in past Principal Problem:   Syncope Active Problems:   KIDNEY DISEASE, CHRONIC, STAGE V   Chronic ischemic left MCA stroke   Aphasia as late effect of cerebrovascular accident   Hemiplegia, unspecified, affecting dominant side   Hyperkalemia  Ct head is pending.  Trop is pending.   Probably due to vagal event however high risk for seizure, and tia/cva.  Monitor closely on tele.  Repeat k in am has been tx in ED.  ekg no acute changes.  ckd at baseline.  Full code.  cva pathway.  Beverley Sherrard A 10/28/2012, 9:39 PM

## 2012-10-28 NOTE — ED Notes (Signed)
Called and spoke with [facility] Cindy rn.  To inform results from pt stroke scale and swallow screen

## 2012-10-28 NOTE — ED Notes (Signed)
Introduced self to pt.  Pt sts nothing needed at this time.  Wife at bedside. Pt a/o no distress.

## 2012-10-28 NOTE — ED Notes (Signed)
In to check on pt.  Pt bp was 74/44.  Recheck manuel was 76/49.  Dr. Onalee Hua was paged and responded.   Once she responded pt bp was up to 98/61.  No new orders were given.  But she did put in for more frequent vitals for upstairs.

## 2012-10-29 ENCOUNTER — Observation Stay (HOSPITAL_COMMUNITY): Payer: Medicaid Other

## 2012-10-29 DIAGNOSIS — I517 Cardiomegaly: Secondary | ICD-10-CM

## 2012-10-29 DIAGNOSIS — E669 Obesity, unspecified: Secondary | ICD-10-CM

## 2012-10-29 LAB — BASIC METABOLIC PANEL
BUN: 37 mg/dL — ABNORMAL HIGH (ref 6–23)
Chloride: 113 mEq/L — ABNORMAL HIGH (ref 96–112)
Creatinine, Ser: 2.82 mg/dL — ABNORMAL HIGH (ref 0.50–1.35)
Glucose, Bld: 87 mg/dL (ref 70–99)
Potassium: 4.3 mEq/L (ref 3.5–5.1)

## 2012-10-29 LAB — LIPID PANEL: HDL: 44 mg/dL (ref >39)

## 2012-10-29 LAB — HEMOGLOBIN A1C
Hgb A1c MFr Bld: 5.6 % (ref <5.7)
Mean Plasma Glucose: 114 mg/dL (ref <117)

## 2012-10-29 MED ORDER — LISINOPRIL 20 MG PO TABS
20.0000 mg | ORAL_TABLET | Freq: Every morning | ORAL | Status: DC
  Filled 2012-10-29: qty 1

## 2012-10-29 MED ORDER — SODIUM CHLORIDE 0.9 % IV BOLUS (SEPSIS)
500.0000 mL | Freq: Once | INTRAVENOUS | Status: AC
  Administered 2012-10-29: 500 mL via INTRAVENOUS

## 2012-10-29 MED ORDER — ENOXAPARIN SODIUM 30 MG/0.3ML ~~LOC~~ SOLN
30.0000 mg | SUBCUTANEOUS | Status: DC
  Administered 2012-10-29 – 2012-10-31 (×3): 30 mg via SUBCUTANEOUS
  Filled 2012-10-29 (×3): qty 0.3

## 2012-10-29 MED ORDER — HYDRALAZINE HCL 10 MG PO TABS
10.0000 mg | ORAL_TABLET | Freq: Two times a day (BID) | ORAL | Status: DC
  Administered 2012-10-29 – 2012-10-30 (×2): 10 mg via ORAL
  Filled 2012-10-29 (×5): qty 1

## 2012-10-29 MED ORDER — LEVETIRACETAM 500 MG/5ML IV SOLN
500.0000 mg | Freq: Once | INTRAVENOUS | Status: AC
  Administered 2012-10-29: 500 mg via INTRAVENOUS
  Filled 2012-10-29: qty 5

## 2012-10-29 MED ORDER — TAMSULOSIN HCL 0.4 MG PO CAPS
0.4000 mg | ORAL_CAPSULE | Freq: Every day | ORAL | Status: DC
  Administered 2012-10-29 – 2012-10-31 (×3): 0.4 mg via ORAL
  Filled 2012-10-29 (×3): qty 1

## 2012-10-29 MED ORDER — CITALOPRAM HYDROBROMIDE 20 MG PO TABS
20.0000 mg | ORAL_TABLET | Freq: Every morning | ORAL | Status: DC
  Administered 2012-10-29 – 2012-10-31 (×3): 20 mg via ORAL
  Filled 2012-10-29 (×3): qty 1

## 2012-10-29 MED ORDER — PANTOPRAZOLE SODIUM 20 MG PO TBEC
20.0000 mg | DELAYED_RELEASE_TABLET | Freq: Every day | ORAL | Status: DC
  Administered 2012-10-29 – 2012-10-31 (×3): 20 mg via ORAL
  Filled 2012-10-29 (×3): qty 1

## 2012-10-29 MED ORDER — DOXAZOSIN MESYLATE 8 MG PO TABS
8.0000 mg | ORAL_TABLET | Freq: Every day | ORAL | Status: DC
  Administered 2012-10-30: 8 mg via ORAL
  Filled 2012-10-29 (×4): qty 1

## 2012-10-29 MED ORDER — ASPIRIN 325 MG PO TABS
325.0000 mg | ORAL_TABLET | Freq: Every day | ORAL | Status: DC
  Administered 2012-10-29 – 2012-10-31 (×3): 325 mg via ORAL
  Filled 2012-10-29 (×4): qty 1

## 2012-10-29 MED ORDER — TOPIRAMATE 25 MG PO TABS
25.0000 mg | ORAL_TABLET | Freq: Every day | ORAL | Status: DC
  Administered 2012-10-29 – 2012-10-30 (×2): 25 mg via ORAL
  Filled 2012-10-29 (×4): qty 1

## 2012-10-29 MED ORDER — METOPROLOL TARTRATE 25 MG PO TABS
25.0000 mg | ORAL_TABLET | Freq: Two times a day (BID) | ORAL | Status: DC
  Administered 2012-10-29: 25 mg via ORAL
  Filled 2012-10-29 (×5): qty 1

## 2012-10-29 MED ORDER — TRAZODONE HCL 50 MG PO TABS
50.0000 mg | ORAL_TABLET | Freq: Every day | ORAL | Status: DC
  Administered 2012-10-29 – 2012-10-30 (×2): 50 mg via ORAL
  Filled 2012-10-29 (×4): qty 1

## 2012-10-29 MED ORDER — LEVETIRACETAM 500 MG PO TABS
500.0000 mg | ORAL_TABLET | Freq: Two times a day (BID) | ORAL | Status: DC
  Administered 2012-10-29 – 2012-10-31 (×5): 500 mg via ORAL
  Filled 2012-10-29 (×7): qty 1

## 2012-10-29 MED ORDER — RESOURCE THICKENUP CLEAR PO POWD
ORAL | Status: DC | PRN
  Filled 2012-10-29: qty 125

## 2012-10-29 NOTE — Evaluation (Signed)
Clinical/Bedside Swallow Evaluation Patient Details  Name: Collin Diaz MRN: [id number] Date of Birth: [birth year]  Today's Date: 10/29/2012 Time: 1000-1012 SLP Time Calculation (min): 12 min  Past Medical History:  Past Medical History  Diagnosis Date  . Hypertension   . Arthritis 04/04/2012  . Seizure 04/04/2012    POST CVA 12/2011  . RSD (reflex sympathetic dystrophy) 04/04/2012    LEFT HAND  . Dysphagia S/P CVA (cerebrovascular accident) 04/04/2012  . Aphasia 04/04/2012    HX OF POST cva  . Stroke 10/2010    LEFT mca cva ISCHEMIC  . 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 Chronic kidney disease   . Depression   . RSD (reflex sympathetic dystrophy) 2013    LEFT HAND   Past Surgical History:  Past Surgical History  Procedure Laterality Date  . No past surgeries     HPI:  57 yo male h/o multiple cva in past with residual left sided hemiparesis, seizure in the past, htn, ckd comes in with EMS today after having a syncopal event after having a bowel movement on the toilet.  He lives at home with his wife, the wife had him on the toilet, he had just had a bm, she left him on the toilet for a few minutes when she arrived back she found him slumped over on toilet passed out.  He quickly came too, and was back to normal his baseline.  EMS was called, on their arrival his bp was 92/63.  Wife reports his bp has been running low lately.  Pt denies any cp, no fevers.  No recent n/v/d. No abd pain, no le edema or swelling.  Back to his baseline neurological status.  No sob. Did not fall off the toilet, no head injury.   Assessment / Plan / Recommendation Clinical Impression  Pt with chronic dysphagia with silent aspiration of thin liquids, seen on MBS one month ago. Today pt continues to demonstrate adequate tolerance of previously recommended diet texture, Regular/nectar. Will f/u x1 to attempt education with wife, who may be noncompliant at home wth  recommendations based on past conversations.     Aspiration Risk  Mild    Diet Recommendation Dysphagia 3 (Mechanical Soft);Nectar-thick liquid   Liquid Administration via: Cup;Straw Medication Administration: Whole meds with puree Supervision: Patient able to self feed;Full supervision/cueing for compensatory strategies Compensations: Slow rate;Small sips/bites Postural Changes and/or Swallow Maneuvers: Seated upright 90 degrees    Other  Recommendations Oral Care Recommendations: Oral care BID Other Recommendations: Order thickener from pharmacy   Follow Up Recommendations  None    Frequency and Duration min 1 x/week  1 week   Pertinent Vitals/Pain NA    SLP Swallow Goals Patient will utilize recommended strategies during swallow to increase swallowing safety with: Minimal assistance   Swallow Study Prior Functional Status  Type of Home: Apartment Available Help at Discharge: Family;Available 24 hours/day    General HPI: 57 yo male h/o multiple cva in past with residual left sided hemiparesis, seizure in the past, htn, ckd comes in with EMS today after having a syncopal event after having a bowel movement on the toilet.  He lives at home with his wife, the wife had him on the toilet, he had just had a bm, she left him on the toilet for a few minutes when she arrived back she found him slumped over on toilet passed out.  He quickly came too, and was back to  normal his baseline.  EMS was called, on their arrival his bp was 92/63.  Wife reports his bp has been running low lately.  Pt denies any cp, no fevers.  No recent n/v/d. No abd pain, no le edema or swelling.  Back to his baseline neurological status.  No sob. Did not fall off the toilet, no head injury. Type of Study: Bedside swallow evaluation Diet Prior to this Study: NPO Temperature Spikes Noted: No Respiratory Status: Room air History of Recent Intubation: No Behavior/Cognition: Alert;Cooperative;Pleasant mood;Requires  cueing Oral Cavity - Dentition: Adequate natural dentition Self-Feeding Abilities: Able to feed self Patient Positioning: Upright in bed Baseline Vocal Quality: Clear Volitional Cough: Weak Volitional Swallow: Able to elicit    Oral/Motor/Sensory Function Overall Oral Motor/Sensory Function: Appears within functional limits for tasks assessed   Ice Chips     Thin Liquid Thin Liquid: Not tested    Nectar Thick Nectar Thick Liquid: Within functional limits   Honey Thick Honey Thick Liquid: Not tested   Puree Puree: Not tested   Solid   GO Functional Assessment Tool Used: clinical judgement Functional Limitations: Swallowing Swallow Current Status (Z6109): At least 20 percent but less than 40 percent impaired, limited or restricted Swallow Goal Status [phone]): At least 20 percent but less than 40 percent impaired, limited or restricted  Solid: Within functional limits      [facility], [place] CCC-SLP [phone]  Claudine Mouton 10/29/2012,10:24 AM

## 2012-10-29 NOTE — Progress Notes (Addendum)
Addendum: Change Follow Up Recommendation/place of Discharge  Occupational Therapy Evaluation Patient Details Name: Collin Diaz MRN: [id number] DOB: 09-15-1955 Today's Date: 10/29/2012 Time: 4098-1191 OT Time Calculation (min): 35 min  OT Assessment / Plan / Recommendation History of present illness 57 y.o. 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   Clinical Impression   Pt is +1 total for all ADL and +2 for all functional transfers. Pt would benefit from skilled OT in the acute setting to incr ability and independence in ADL and to work with his caregiver to ensure safety for both Pt and caregiver before d/c home with HHOT.    OT Assessment  Patient needs continued OT Services    Follow Up Recommendations  SNF, If Pt/Family not agreeable to SNF then look into HHOT.             Frequency  Min 2X/week    Precautions / Restrictions Precautions Precautions: Fall Restrictions Weight Bearing Restrictions: No   Pertinent Vitals/Pain Pt did not report any pain this session    ADL  Eating/Feeding: NPO Grooming: +1 Total assistance Where Assessed - Grooming: Supported sitting Upper Body Bathing: +1 Total assistance Where Assessed - Upper Body Bathing: Supine, head of bed up Lower Body Bathing: +1 Total assistance Where Assessed - Lower Body Bathing: Supine, head of bed up Upper Body Dressing: +1 Total assistance Where Assessed - Upper Body Dressing: Supported sitting Lower Body Dressing: +1 Total assistance Where Assessed - Lower Body Dressing: Rolling right and/or left Toilet Transfer: +2 Total assistance Toilet Transfer: Patient Percentage: 10% Toilet Transfer Method: Stand pivot Toilet Transfer Equipment: Bedside commode Toileting - Clothing Manipulation and Hygiene: +1 Total assistance Where Assessed - Medical sales representative and Hygiene: Sit to stand from 3-in-1 or toilet Tub/Shower Transfer: +2 Total assistance Tub/Shower Transfer: Patient Percentage: 0% Tub/Shower Transfer Method: Not assessed Equipment Used: Gait belt Transfers/Ambulation Related to ADLs: Pt +2 total assist for all transitions and mobility. Pt does better going to the right. ADL Comments: Pt is totally dependent for all ADL.    OT Diagnosis: Generalized weakness;Hemiplegia non-dominant side  OT Problem List: Decreased strength;Impaired balance (sitting and/or standing);Impaired UE functional use OT Treatment Interventions: Self-care/ADL training;Splinting;Therapeutic activities;Patient/family education;Balance training   OT Goals(Current goals can be found in the care plan section) Acute Rehab OT Goals Patient Stated Goal: none stated OT Goal Formulation: With patient Time For Goal Achievement: 11/12/12 Potential to Achieve Goals: Fair ADL Goals Additional ADL Goal #1: Pt will sit EOB for 3 min unsupported in preparation for ADL Additional ADL Goal #2: Pt will verbalize to caregiver how to complete a safe functional transfer to bedside commode  Visit Information  Last OT Received On: 10/29/12 Assistance Needed: +2 History of Present Illness: 57 y.o. 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       Prior Functioning     Home Living Family/patient expects to be discharged to:: Private residence Living Arrangements: Spouse/significant other Available Help at Discharge: Family;Available 24 hours/day Type of Home: Apartment Home Access: Ramped entrance Home Layout: One level Home Equipment: Wheelchair - manual;Hospital bed Prior Function Level of Independence: Needs assistance Gait / Transfers Assistance Needed: using wheelchair and dependent for transfers ADL's / Homemaking  Assistance Needed: completely dependent Communication Communication: No difficulties Dominant Hand: Right            Cognition  Cognition Arousal/Alertness: Awake/alert Behavior During Therapy: WFL for tasks assessed/performed Overall Cognitive Status: Within Functional Limits for tasks assessed    Extremity/Trunk Assessment Upper Extremity Assessment Upper Extremity Assessment: LUE deficits/detail LUE Deficits / Details: hemiplegia. Also tone in L hand, Pt reports wearing a splint at night, but does not have in the hospital. LUE Coordination: decreased fine motor;decreased gross motor Lower Extremity Assessment Lower Extremity Assessment: LLE deficits/detail LLE Deficits / Details: LLE hemiplegia LLE Coordination: decreased fine motor;decreased gross motor     Mobility Bed Mobility Bed Mobility: Rolling Left;Left Sidelying to Sit;Sitting - Scoot to Edge of Bed Rolling Left: 1: +1 Total assist Left Sidelying to Sit: 1: +1 Total assist Sitting - Scoot to Edge of Bed: 1: +1 Total assist Details for Bed Mobility Assistance: Pt needed total assistance despite tactile and vc's Transfers Transfers: Sit to Stand;Stand to Sit Sit to Stand: 1: +2 Total assist Sit to Stand: Patient Percentage: 10% Stand to Sit: 1: +2 Total assist Stand to Sit: Patient Percentage: 10% Details for Transfer Assistance: used nose over toes technique and Pt still +2 total.            End of Session OT - End of Session Equipment Utilized During Treatment: Gait belt Activity Tolerance: Patient tolerated treatment well Patient left: in chair;with call bell/phone within reach Nurse Communication: Mobility status;Precautions  GO     Sherryl Manges 10/29/2012, 10:20 AM

## 2012-10-29 NOTE — Progress Notes (Signed)
I agree with the following treatment note after reviewing documentation.   Johnston, Nilsa Macht Brynn   OTR/L Pager: [phone] Office: [phone] .   

## 2012-10-29 NOTE — Progress Notes (Signed)
*  PRELIMINARY RESULTS* Vascular Ultrasound Carotid Duplex (Doppler) has been completed.   There is no obvious evidence of hemodynamically significant internal carotid artery stenosis >40%. The right vertebral artery is patent with antegrade flow. Unable to visualize the left vertebral artery.  10/29/2012 4:32 PM Gertie Fey, RVT, RDCS, RDMS

## 2012-10-29 NOTE — ED Provider Notes (Signed)
Medical screening examination/treatment/procedure(s) were performed by non-physician practitioner and as supervising physician I was immediately available for consultation/collaboration.    Gilda Crease, MD 10/29/12 [phone]

## 2012-10-29 NOTE — Progress Notes (Signed)
[facility] PROGRESS NOTE  Collin Diaz:[id number] DOB: [birth year] DOA: 10/28/2012 PCP: Marletta Lor, NP  Assessment/Plan: Syncope: - Question vasovagal vs hypotension vs CVA - MRI, carotid dopplers, echo pending - Hold bp for sbp <100 - PT/OT/SLP  Hx chronic L MCA CVA - Stable - W/u pending  Prior Aphasia: - SLP recs noted  CKD 5: - Will follow renal function closely  Code Status: Full Family Communication: Pt in room (indicate person spoken with, relationship, and if by phone, the number) Disposition Plan: Pending   HPI/Subjective: No acute events noted overnight. Awaiting studies  Objective: Filed Vitals:   10/29/12 0409 10/29/12 0614 10/29/12 0800 10/29/12 1000  BP: 83/55 93/67 103/80 112/78  Pulse: 68 75 81 72  Temp: 97.3 F (36.3 C) 97.7 F (36.5 C) 98 F (36.7 C) 97.7 F (36.5 C)  TempSrc: Oral Oral Oral Oral  Resp: 18 18 17 18   Height:      Weight:      SpO2: 95% 95% 96% 100%    Intake/Output Summary (Last 24 hours) at 10/29/12 1125 Last data filed at 10/29/12 0500  Gross per 24 hour  Intake   1210 ml  Output    750 ml  Net    460 ml   Filed Weights   10/28/12 2356  Weight: 77.429 kg (170 lb 11.2 oz)    Exam:   General:  Awake, in nad  Cardiovascular: regular, s1, s2  Respiratory: normal resp effort, no wheezing  Abdomen: soft, nondistended  Musculoskeletal: perfused, no clubbing   Data Reviewed: Basic Metabolic Panel:  Recent Labs Lab 10/28/12 1802 10/28/12 1834 10/29/12 0555  NA 140 142 143  Diaz 6.0* 6.0* 4.3  CL 109 115* 113*  CO2 22  --  17*  GLUCOSE 92 89 87  BUN 40* 40* 37*  CREATININE 2.86* 2.70* 2.82*  CALCIUM 9.9  --  9.6   Liver Function Tests: No results found for this basename: AST, ALT, ALKPHOS, BILITOT, PROT, ALBUMIN,  in the last 168 hours No results found for this basename: LIPASE, AMYLASE,  in the last 168 hours No results found for this basename: AMMONIA,  in the last 168  hours CBC:  Recent Labs Lab 10/28/12 1807 10/28/12 1834  WBC 2.9*  --   NEUTROABS 1.2*  --   HGB 11.8* 13.3  HCT 35.2* 39.0  MCV 88.4  --   PLT 104*  --    Cardiac Enzymes:  Recent Labs Lab 10/29/12 0129  TROPONINI <0.30   BNP (last 3 results)  Recent Labs  04/04/12 0633 10/06/12 2222  PROBNP 116.6 321.5*   CBG:  Recent Labs Lab 10/28/12 1823  GLUCAP 82    No results found for this or any previous visit (from the past 240 hour(s)).   Studies: Ct Head Wo Contrast  10/28/2012   *RADIOLOGY REPORT*  Clinical Data: Alert but not responding.  Lethargic.  CT HEAD WITHOUT CONTRAST  Technique:  Contiguous axial images were obtained from the base of the skull through the vertex without contrast.  Comparison: 10/17/2012.  Findings:  Calvarium: No acute osseous abnormality.  No lytic or blastic lesion.  Orbits: No acute abnormality.  Brain: No evidence of acute abnormality, including acute large territory infarction, hemorrhage, hydrocephalus, or mass lesion/mass effect.Extensive, confluent bilateral cerebral white matter low density, consist with chronic small vessel ischemic injury.  Multiple perforator infarcts in the bilateral lenticulostriate distributions, affecting the deep gray nuclei and neighboring white matter tracts.  IMPRESSION: 1.  No evidence of acute hemorrhage or large vessel infarct.   2.  Extensive chronic small vessel ischemic injury with remote perforator infarctions.   Original Report Authenticated By: Tiburcio Pea   Dg Chest Port 1 View  10/28/2012   *RADIOLOGY REPORT*  Clinical Data: Syncope, shortness of breath  PORTABLE CHEST - 1 VIEW  Comparison: 10/06/2012  Findings: Heart size upper limits normal.  Lungs clear.  No effusion.  No pneumothorax.  Regional bones unremarkable.  IMPRESSION:  Negative   Original Report Authenticated By: D. Andria Rhein, MD    Scheduled Meds: . aspirin  325 mg Oral Daily  . citalopram  20 mg Oral q morning - 10a  .  doxazosin  8 mg Oral QHS  . enoxaparin (LOVENOX) injection  30 mg Subcutaneous Q24H  . hydrALAZINE  10 mg Oral BID  . levETIRAcetam  500 mg Oral Q12H  . metoprolol tartrate  25 mg Oral BID  . pantoprazole  20 mg Oral Daily  . tamsulosin  0.4 mg Oral Daily  . topiramate  25 mg Oral QHS  . traZODone  50 mg Oral QHS   Continuous Infusions:   Principal Problem:   Syncope Active Problems:   KIDNEY DISEASE, CHRONIC, STAGE V   Chronic ischemic left MCA stroke   Aphasia as late effect of cerebrovascular accident   Hemiplegia, unspecified, affecting dominant side   Hyperkalemia    Time spent:    Collin Diaz  [facility] Pager [phone]. If 7PM-7AM, please contact night-coverage at [email or web address], password [facility] 10/29/2012, 11:25 AM  LOS: 1 day

## 2012-10-29 NOTE — Progress Notes (Signed)
Speech Pathology Note  Pt well known to this service, has a long history of dysphagia with silent aspiration, listed in PMH. Somehow however, pt passed RN stroke swallow screen in error. Here is a copy of pts last MBS, just last month. He will need to initiate at best a Dys 3/nectar thick diet with SLP assessment if function has changed with new admission. Currently we have an order for a speech language eval.  Collin Ditty, MA CCC-SLP [phone]    Study dated 09/25/12 - OLD STUDY, not done this admit.  Objective Swallowing Evaluation: Modified Barium Swallowing Study    Patient Details  Name: Collin Diaz  MRN: [id number]  Date of Birth: July 10, 1955  Today's Date: 09/25/2012  Time: 3086-5784  SLP Time Calculation (min): 28 min  Past Medical History:  Past Medical History   Diagnosis  Date   .  Hypertension    .  Arthritis  04/04/2012   .  Seizure  04/04/2012     POST CVA 12/2011   .  RSD (reflex sympathetic dystrophy)  04/04/2012     LEFT HAND   .  Dysphagia S/P CVA (cerebrovascular accident)  04/04/2012   .  Aphasia  04/04/2012     HX OF POST cva   .  Stroke  10/2010     LEFT mca cva ISCHEMIC   .  Stroke  12/30/11     R THALAMIC AND BASAL GANGLIA HEMMORRHAIC cva   .  Chronic kidney disease    .  Depression    .  RSD (reflex sympathetic dystrophy)  2013     LEFT HAND   Past Surgical History:  Past Surgical History   Procedure  Laterality  Date   .  No past surgeries     HPI:  Collin Diaz is an 57 y.o. 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 He did not have any seizure activities, CP or shortness of breath. Wife stated he has had diarrhea for three days. Following CVA's, pt with resulting dysphgia, has been seen by SLP service multiple time sin the past, recommended to consume Dys3/nectar at last MBS in 1/14. Prior to admit, pt  scheduled for repeat OP MBS on 09/26/12 at [facility] to determine readiness for upgrade. Per the wife, pt continues to drink nectar thick liquids at home; pt reports he drinks thin liquids. Given new admission, pt recommended to complete MBS while an inpatient.  Assessment / Plan / Recommendation  Clinical Impression  Dysphagia Diagnosis: Moderate pharyngeal phase dysphagia  Clinical impression: Pt demonstrates little to no functional change since last MBS. Pt continues to silently aspirate thin liquids in most trials due to a delay in swallow initiation and decreased laryngeal closure and lack of sensation. SLP attempted chin tuck and head turns with no success in decreasing frequency or severity of aspiration. Cues cough/thrat clear does not expel aspirate. The only effective method to decrease aspiration is limiting bolus size to small teaspoon amounts or by thickening to nectar. For now, recommend pt consume a dys 3 (mech soft) diet with nectar thick liquids. Will discuss options with wife, though defer also to treating SLP.   Treatment Recommendation  Therapy as outlined in treatment plan below   Diet Recommendation  Dysphagia 3 (Mechanical Soft);Nectar-thick liquid  Liquid Administration via: Cup  Medication Administration: Whole meds with puree  Supervision: Patient able to self feed;Intermittent supervision to cue for compensatory strategies  Compensations: Slow rate;Small sips/bites  Postural Changes and/or Swallow Maneuvers: Seated upright 90 degrees   Other Recommendations  Oral Care Recommendations: Oral care BID  Other Recommendations: Order thickener from pharmacy   Follow Up Recommendations  Home health SLP   Frequency and Duration  min 2x/week  1 week   Pertinent Vitals/Pain  NA   SLP Swallow Goals  Patient will utilize recommended strategies during swallow to increase swallowing safety with: Minimal cueing  Swallow Study Goal #2 - Progress: Progressing toward goal  General  HPI:  Collin Diaz is an 57 y.o. 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 He did not have any seizure activities, CP or shortness of breath. Wife stated he has had diarrhea for three days. Following CVA's, pt with resulting dysphgia, has been seen by SLP service multiple time sin the past, recommended to consume Dys3/nectar at last MBS in 1/14. Prior to admit, pt scheduled for repeat OP MBS on 09/26/12 at [facility] to determine readiness for upgrade. Per the wife, pt continues to drink nectar thick liquids at home; pt reports he drinks thin liquids. Given new admission, pt recommended to complete MBS while an inpatient.  Type of Study: Modified Barium Swallowing Study  Reason for Referral: Objectively evaluate swallowing function  Previous Swallow Assessment: several, see HPI  Diet Prior to this Study: Dysphagia 3 (soft);Nectar-thick liquids  Temperature Spikes Noted: No  Respiratory Status: Room air  History of Recent Intubation: No  Behavior/Cognition: Alert;Cooperative  Oral Cavity - Dentition: Adequate natural dentition  Oral Motor / Sensory Function: Within functional limits  Self-Feeding Abilities: Able to feed self  Patient Positioning: Upright in chair  Baseline Vocal Quality: Clear;Low vocal intensity  Volitional Cough: Weak  Volitional Swallow: Able to elicit  Anatomy: Within functional limits  Pharyngeal Secretions: Not observed secondary MBS   Reason for Referral  Objectively evaluate swallowing function   Oral Phase  Oral Preparation/Oral Phase  Oral Phase: Impaired  Oral - Nectar  Oral - Nectar Cup: Other (Comment) (posterior, premature loss of bolus)  Oral - Thin  Oral - Thin Teaspoon: Other (Comment) (posterior, premature loss of bolus)  Oral - Thin Cup: Other (Comment) (posterior, premature loss of bolus)  Oral - Solids  Oral - Puree:  Within functional limits  Oral - Regular: Within functional limits  Oral - Pill: Within functional limits   Pharyngeal Phase  Pharyngeal Phase  Pharyngeal Phase: Impaired  Pharyngeal - Nectar  Pharyngeal - Nectar Cup: Penetration/Aspiration before swallow;Delayed swallow initiation;Premature spillage to pyriform sinuses  Penetration/Aspiration details (nectar cup): Material does not enter airway  Pharyngeal - Thin  Pharyngeal - Thin Teaspoon: Delayed swallow initiation;Premature spillage to pyriform sinuses;Reduced airway/laryngeal closure;Penetration/Aspiration before swallow  Penetration/Aspiration details (thin teaspoon): Material enters airway, remains ABOVE vocal cords and not ejected out;Material does not enter airway  Pharyngeal - Thin Cup: Delayed swallow initiation;Premature spillage to pyriform sinuses;Reduced airway/laryngeal closure;Penetration/Aspiration before swallow;Moderate aspiration  Penetration/Aspiration details (thin cup): Material enters airway, passes BELOW cords without attempt by patient to eject out (silent aspiration);Material enters airway, CONTACTS cords and not ejected out  Pharyngeal - Solids  Pharyngeal - Puree: Within functional limits  Pharyngeal - Regular: Within functional limits   Cervical Esophageal Phase  GO    Collin Ditty, MA CCC-SLP [phone]  Collin Diaz,  Collin Diaz  09/25/2012, 2:20 PM

## 2012-10-29 NOTE — Progress Notes (Signed)
   CARE MANAGEMENT NOTE 10/29/2012  Patient:  Collin Diaz, Collin Diaz   Account Number:  [id number]  Date Initiated:  10/29/2012  Documentation initiated by:  Jiles Crocker  Subjective/Objective Assessment:   ADMITTED WITH SYNCOPY     Action/Plan:   PCP:  Marletta Lor, NP  LIVES AT HOME WITH SPOUSE; CM FOLLOWING FOR DCP   Anticipated DC Date:  11/01/2012   Anticipated DC Plan:  HOME/SELF CARE      DC Planning Services  CM consult         Status of service:  In process, will continue to follow Medicare Important Message given?  NA - LOS <3 / Initial given by admissions (If response is "NO", the following Medicare IM given date fields will be blank)  Per UR Regulation:  Reviewed for med. necessity/level of care/duration of stay Comments:  10/29/2012- B Pegge Cumberledge RN,BSN,MHA

## 2012-10-29 NOTE — Progress Notes (Signed)
I agree with the following treatment note after reviewing documentation.   Johnston, Frederic Tones Brynn   OTR/L Pager: [phone] Office: [phone] .   

## 2012-10-29 NOTE — Progress Notes (Signed)
*  PRELIMINARY RESULTS* Echocardiogram 2D Echocardiogram has been performed.  Collin Diaz 10/29/2012, 12:07 PM

## 2012-10-29 NOTE — Evaluation (Addendum)
Physical Therapy Evaluation Patient Details Name: Collin Diaz MRN: [id number] DOB: [birth year] Today's Date: 10/29/2012 Time: 7829-5621 PT Time Calculation (min): 30 min  PT Assessment / Plan / Recommendation History of Present Illness  57 y.o. 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  Clinical Impression  Presents to PT with below listed impairments (see PT problem list) impacting functional independence. Will benefit physical therapy in the acute setting to maximize mobility and decrease caregiver burden. Difficult to determine if he is very far off his baseline (I suspect not). Would benefit from ST-SNF to further strengthen him and better prepare him and wife to care for him at home. If they are not interested in SNF I would recommend a hoyer lift for home.     PT Assessment  Patient needs continued PT services    Follow Up Recommendations  SNF (if they decline SNF or if it is not an option I would rec. HHPT)    Does the patient have the potential to tolerate intense rehabilitation      Barriers to Discharge        Equipment Recommendations  Hoyer lift   Recommendations for Other Services     Frequency Min 3X/week    Precautions / Restrictions Precautions Precautions: Fall Restrictions Weight Bearing Restrictions: No   Pertinent Vitals/Pain Denies pain, assisted patient from chair->3in1->bed; limited session because of elevated K+, per RN they are working on clearing this up      Mobility  Bed Mobility Bed Mobility: Sit to Supine;Scooting to [place] Rolling Left: 1: +1 Total assist Left Sidelying to Sit: 1: +1 Total assist Sitting - Scoot to Edge of Bed: 1: +1 Total assist Sit to Supine: 1: +2 Total assist Sit to Supine: Patient Percentage: 10% Scooting to HOB: 1: +2 Total assist Scooting to [facility]: Patient Percentage: 0% Details  for Bed Mobility Assistance: sequencing cues, totalA to lift lower extremities to bed and control descent of trunk to bed  Transfers Transfers: Stand Pivot Transfers Sit to Stand: 1: +2 Total assist Sit to Stand: Patient Percentage: 10% Stand to Sit: 1: +2 Total assist Stand to Sit: Patient Percentage: 10% Stand Pivot Transfers: 1: +2 Total assist Stand Pivot Transfers: Patient Percentage: 10% Details for Transfer Assistance: SPT x2 in the direction of the stronger leg (to the right), facilitation bilaterally for lower extremity and trunk positioning in prep for transfer, assist to transfer weight anterior over BOS as well as lift off and power up with trunk support in standing, totalA to shift hips Ambulation/Gait Ambulation/Gait Assistance: Not tested (comment) Ambulation/Gait Assistance Details: does not walk at baseline Modified Rankin (Stroke Patients Only) Pre-Morbid Rankin Score: Severe disability Modified Rankin: Severe disability    Exercises     PT Diagnosis: Generalized weakness;Hemiplegia non-dominant side  PT Problem List: Decreased strength;Decreased activity tolerance;Decreased mobility;Decreased cognition;Decreased balance PT Treatment Interventions: Functional mobility training;Therapeutic activities;Therapeutic exercise;Balance training;Neuromuscular re-education;Patient/family education;Wheelchair mobility training     PT Goals(Current goals can be found in the care plan section) Acute Rehab PT Goals Patient Stated Goal: to move better  PT Goal Formulation: With patient Time For Goal Achievement: 11/05/12 Potential to Achieve Goals: Fair  Visit Information  Last PT Received On: 10/29/12 Assistance Needed: +2 History of Present Illness: 57 y.o. 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       Prior Functioning  Home  Living Family/patient expects to be discharged to:: Private residence Living Arrangements: Spouse/significant other Available Help at Discharge: Family;Available 24 hours/day Type of Home: Apartment Home Access: Ramped entrance Home Layout: One level Home Equipment: Wheelchair - manual;Hospital bed  Lives With: Spouse Prior Function Level of Independence: Needs assistance Gait / Transfers Assistance Needed: using wheelchair and dependent for transfers; inconsistent with his answers, initially he reported that he was able to get to the w/c with little help from his wife however at the end of the session he  reports that he never gets out of the bed and that he stays bed bound most of the time  ADL's / Homemaking Assistance Needed: completely dependent Comments: pt states his wife feeds him Communication Communication: No difficulties Dominant Hand: Right    Cognition  Cognition Arousal/Alertness: Awake/alert Behavior During Therapy: WFL for tasks assessed/performed Overall Cognitive Status: History of cognitive impairments - at baseline (inconsistent with POLF answers)    Extremity/Trunk Assessment Upper Extremity Assessment Upper Extremity Assessment: LUE deficits/detail LUE Deficits / Details: hemiplegia. Also tone in L hand, Pt reports wearing a splint at night, but does not have in the hospital. LUE Coordination: decreased fine motor;decreased gross motor Lower Extremity Assessment Lower Extremity Assessment: LLE deficits/detail LLE Deficits / Details: LLE hemiplegia, 0/5 noted (possibly some functional extensor tone during transfer however difficult to determine) LLE Coordination: decreased fine motor;decreased gross motor   Balance Balance Balance Assessed: Yes Static Sitting Balance Static Sitting - Balance Support: Right upper extremity supported Static Sitting - Level of Assistance: 3: Mod assist Static Sitting - Comment/# of Minutes: posterior pelvic tilt with slumped  posture  End of Session PT - End of Session Equipment Utilized During Treatment: Gait belt Activity Tolerance: Patient limited by fatigue Patient left: in bed;with call bell/phone within reach;with bed alarm set Nurse Communication: Mobility status  GP Functional Assessment Tool Used: clinical judgement Functional Limitation: Changing and maintaining body position Changing and Maintaining Body Position Current Status (Z6109): At least 80 percent but less than 100 percent impaired, limited or restricted Changing and Maintaining Body Position Goal Status (U0454): At least 60 percent but less than 80 percent impaired, limited or restricted   [facility]EN 10/29/2012, 10:53 AM

## 2012-10-29 NOTE — Evaluation (Signed)
Speech Language Pathology Evaluation Patient Details Name: TAJAH SCHREINER MRN: [id number] DOB: [birth year] Today's Date: 10/29/2012 Time: 1000-1012 SLP Time Calculation (min): 12 min  Problem List:  Patient Active Problem List   Diagnosis Date Noted  . Hyperkalemia 10/28/2012  . Diarrhea 09/26/2012  . CVA (cerebral infarction) 04/06/2012  . Acute-on-chronic renal failure 04/04/2012  . Arthritis 04/04/2012  . Seizure 04/04/2012  . RSD (reflex sympathetic dystrophy) 04/04/2012  . Dysphagia S/P CVA (cerebrovascular accident) 04/04/2012  . Aphasia 04/04/2012  . Slurred speech 04/04/2012  . Right arm weakness 04/04/2012  . Cerebral embolism with cerebral infarction 04/04/2012  . Hemiplegia, unspecified, affecting dominant side 04/04/2012  . Syncope 01/09/2012  . Induced hypernatremia 01/03/2012  . Obesity (BMI 30.0-34.9) 01/03/2012  . OSA (obstructive sleep apnea) 01/03/2012  . Renal insufficiency 01/03/2012  . Chronic ischemic left MCA stroke 01/03/2012  . Aphasia as late effect of cerebrovascular accident 01/03/2012  . Depression 01/03/2012  . 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 ALLERGIC RHINITIS 02/07/2007  . FACIAL RASH 02/07/2007  . GOUT NOS 08/18/2006  . Malignant hypertension 08/18/2006  . KIDNEY DISEASE, CHRONIC, STAGE V 08/18/2006   Past Medical History:  Past Medical History  Diagnosis Date  . Hypertension   . Arthritis 04/04/2012  . Seizure 04/04/2012    POST CVA 12/2011  . RSD (reflex sympathetic dystrophy) 04/04/2012    LEFT HAND  . Dysphagia S/P CVA (cerebrovascular accident) 04/04/2012  . Aphasia 04/04/2012    HX OF POST cva  . Stroke 10/2010    LEFT mca cva ISCHEMIC  . 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 Chronic kidney disease   . Depression   . RSD (reflex sympathetic dystrophy) 2013    LEFT HAND   Past Surgical History:  Past Surgical History  Procedure Laterality Date  . No past surgeries     HPI:  57 yo male h/o  multiple cva in past with residual left sided hemiparesis, seizure in the past, htn, ckd comes in with EMS today after having a syncopal event after having a bowel movement on the toilet.  He lives at home with his wife, the wife had him on the toilet, he had just had a bm, she left him on the toilet for a few minutes when she arrived back she found him slumped over on toilet passed out.  He quickly came too, and was back to normal his baseline.  EMS was called, on their arrival his bp was 92/63.  Wife reports his bp has been running low lately.  Pt denies any cp, no fevers.  No recent n/v/d. No abd pain, no le edema or swelling.  Back to his baseline neurological status.  No sob. Did not fall off the toilet, no head injury.   Assessment / Plan / Recommendation Clinical Impression  Pt with baseline cognitive deficits listed below. Pt appears unchanged, no acute SLP needs at this time. If pt was receiving SLP services at home, these should continue.     SLP Assessment  All further Speech Lanaguage Pathology  needs can be addressed in the next venue of care    Follow Up Recommendations  None    Frequency and Duration min 1 x/week      Pertinent Vitals/Pain NA   SLP Goals     SLP Evaluation Prior Functioning  Cognitive/Linguistic Baseline: Baseline deficits Baseline deficit details: Right hemisphere disorder, participates in basic functional tasks, poor initiation, flat affet, memory deficits Type  of Home: Apartment  Lives With: Spouse Available Help at Discharge: Family;Available 24 hours/day Vocation: On disability   Cognition  Overall Cognitive Status: History of cognitive impairments - at baseline Arousal/Alertness: Awake/alert Orientation Level: Oriented to person;Oriented to place;Disoriented to situation;Disoriented to time Attention: Sustained Sustained Attention: Appears intact Memory: Impaired Memory Impairment: Decreased long term memory;Decreased short term  memory Decreased Long Term Memory: Verbal basic Decreased Short Term Memory: Verbal basic Awareness: Impaired Awareness Impairment: Emergent impairment;Anticipatory impairment Problem Solving: Impaired Problem Solving Impairment: Verbal complex;Functional complex Executive Function: Initiating Initiating: Impaired Initiating Impairment: Verbal complex;Functional complex    Comprehension  Auditory Comprehension Overall Auditory Comprehension: Appears within functional limits for tasks assessed Yes/No Questions: Within Functional Limits Commands: Within Functional Limits Conversation: Simple    Expression Verbal Expression Overall Verbal Expression: Appears within functional limits for tasks assessed Initiation: Impaired (Quiet, responds to questions, minimal requests, needs cues) Automatic Speech: Name;Counting Naming: No impairment Pragmatics: Impairment Impairments: Abnormal affect;Monotone;Topic maintenance;Turn Taking Interfering Components: Premorbid deficit Written Expression Dominant Hand: Right   Oral / Motor Oral Motor/Sensory Function Overall Oral Motor/Sensory Function: Appears within functional limits for tasks assessed Motor Speech Overall Motor Speech: Appears within functional limits for tasks assessed   GO Functional Assessment Tool Used: clinical judgement Functional Limitations: Swallowing Swallow Current Status (R6045): At least 20 percent but less than 40 percent impaired, limited or restricted Swallow Goal Status [phone]): At least 20 percent but less than 40 percent impaired, limited or restricted  [facility], MA CCC-SLP [phone]   Claudine Mouton 10/29/2012, 10:30 AM

## 2012-10-30 DIAGNOSIS — Z8673 Personal history of transient ischemic attack (TIA), and cerebral infarction without residual deficits: Secondary | ICD-10-CM

## 2012-10-30 LAB — BASIC METABOLIC PANEL
BUN: 34 mg/dL — ABNORMAL HIGH (ref 6–23)
CO2: 18 mEq/L — ABNORMAL LOW (ref 19–32)
Calcium: 9.5 mg/dL (ref 8.4–10.5)
Chloride: 116 mEq/L — ABNORMAL HIGH (ref 96–112)
Creatinine, Ser: 2.44 mg/dL — ABNORMAL HIGH (ref 0.50–1.35)
Glucose, Bld: 85 mg/dL (ref 70–99)

## 2012-10-30 MED ORDER — SODIUM BICARBONATE 650 MG PO TABS
650.0000 mg | ORAL_TABLET | Freq: Two times a day (BID) | ORAL | Status: DC
  Administered 2012-10-30 – 2012-10-31 (×3): 650 mg via ORAL
  Filled 2012-10-30 (×4): qty 1

## 2012-10-30 NOTE — Progress Notes (Signed)
[facility] PROGRESS NOTE  Collin Diaz UEA:[id number] DOB: 1956/01/05 DOA: 10/28/2012 PCP: Marletta Lor, NP  Assessment/Plan: #1 syncope Likely vasovagal versus hypotension. Patient's blood pressure is noted to be borderline in the 90s to low 100s. CT of the head was negative. MRI of the head was negative for any acute stroke. 2-D echo no source of emboli. Carotid Dopplers with no significant stenosis. Will discontinue patient's hydralazine, lisinopril, Lopressor. Follow.  #2 borderline hypotension Patient's blood pressure has been in the 90s to low 100. On admission his blood pressure was in the 80s. Will discontinue patient's hydralazine, lisinopril, Lopressor. Follow. Monitor blood pressure.  #3 history of chronic left MCA CVA Stable. Stroke workup negative.continue aspirin.  #4 chronic kidney disease Stable. Will add sodium bicarbonate tablets.  Followup as outpatient.  #5 history of seizures Continue Keppra.  #6 prophylaxis PPI for GI prophylaxis. SCDs for DVT prophylaxis.  Code Status: Full Family Communication: updated patient Disposition Plan: home when medically stable versus SNF   Consultants:  none  Procedures:  CT head 10/28/2012  Chest x-ray 10/28/2012  MRI/MRA head 10/29/2012  2-D echo 10/29/2012  Carotid Dopplers 10/29/2012  Antibiotics:  none  HPI/Subjective: Patient with no complaints. Patient denies any further syncopal episodes.  Objective: Filed Vitals:   10/29/12 1800 10/29/12 2207 10/30/12 0154 10/30/12 0619  BP: 123/77 99/71 91/67  101/69  Pulse: 68 60 65 69  Temp: 98 F (36.7 C) 98.4 F (36.9 C) 97.6 F (36.4 C) 97.4 F (36.3 C)  TempSrc: Oral Oral Oral Oral  Resp: 18 17 20 18   Height:      Weight:      SpO2: 100% 99% 99% 98%    Intake/Output Summary (Last 24 hours) at 10/30/12 1148 Last data filed at 10/30/12 4782  Gross per 24 hour  Intake      0 ml  Output    375 ml  Net   -375 ml   Filed Weights    10/28/12 2356  Weight: 77.429 kg (170 lb 11.2 oz)    Exam:   General:  NAD  Cardiovascular: RRR  Respiratory: CTAB  Abdomen: soft, nontender, nondistended, positive bowel sounds.  Extremities: No clubbing cyanosis or edema.   Data Reviewed: Basic Metabolic Panel:  Recent Labs Lab 10/28/12 1802 10/28/12 1834 10/29/12 0555 10/30/12 0502  NA 140 142 143 144  K 6.0* 6.0* 4.3 4.3  CL 109 115* 113* 116*  CO2 22  --  17* 18*  GLUCOSE 92 89 87 85  BUN 40* 40* 37* 34*  CREATININE 2.86* 2.70* 2.82* 2.44*  CALCIUM 9.9  --  9.6 9.5   Liver Function Tests: No results found for this basename: AST, ALT, ALKPHOS, BILITOT, PROT, ALBUMIN,  in the last 168 hours No results found for this basename: LIPASE, AMYLASE,  in the last 168 hours No results found for this basename: AMMONIA,  in the last 168 hours CBC:  Recent Labs Lab 10/28/12 1807 10/28/12 1834  WBC 2.9*  --   NEUTROABS 1.2*  --   HGB 11.8* 13.3  HCT 35.2* 39.0  MCV 88.4  --   PLT 104*  --    Cardiac Enzymes:  Recent Labs Lab 10/29/12 0129  TROPONINI <0.30   BNP (last 3 results)  Recent Labs  04/04/12 0633 10/06/12 2222  PROBNP 116.6 321.5*   CBG:  Recent Labs Lab 10/28/12 1823  GLUCAP 82    No results found for this or any previous visit (from the past 240 hour(s)).  Studies: Ct Head Wo Contrast  10/28/2012   *RADIOLOGY REPORT*  Clinical Data: Alert but not responding.  Lethargic.  CT HEAD WITHOUT CONTRAST  Technique:  Contiguous axial images were obtained from the base of the skull through the vertex without contrast.  Comparison: 10/17/2012.  Findings:  Calvarium: No acute osseous abnormality.  No lytic or blastic lesion.  Orbits: No acute abnormality.  Brain: No evidence of acute abnormality, including acute large territory infarction, hemorrhage, hydrocephalus, or mass lesion/mass effect.Extensive, confluent bilateral cerebral white matter low density, consist with chronic small vessel  ischemic injury.  Multiple perforator infarcts in the bilateral lenticulostriate distributions, affecting the deep gray nuclei and neighboring white matter tracts.  IMPRESSION: 1.  No evidence of acute hemorrhage or large vessel infarct.   2.  Extensive chronic small vessel ischemic injury with remote perforator infarctions.   Original Report Authenticated By: Tiburcio Pea   Mr Brain Wo Contrast  10/29/2012   *RADIOLOGY REPORT*  Clinical Data:  Stroke  MRI HEAD WITHOUT CONTRAST MRA HEAD WITHOUT CONTRAST  Technique:  Multiplanar, multiecho pulse sequences of the brain and surrounding structures were obtained without intravenous contrast. Angiographic images of the head were obtained using MRA technique without contrast.  Comparison:  CT 10/28/2012.  MRI 04/04/2012  MRI HEAD  Findings:  Negative for acute infarct.  Chronic hemorrhagic infarcts in the basal ganglia and deep white matter bilaterally.  Chronic ischemia throughout the white matter and pons.  Negative for hydrocephalus.  Negative for mass lesion. No shift of midline structures.  Chronic areas of hemorrhage also in the parietal cortex bilaterally.  These findings suggest chronic hypertension.  Paranasal sinuses are clear.  IMPRESSION: Chronic hemorrhagic infarcts in the basal ganglia bilaterally. Diffuse chronic microvascular ischemic change.  No acute infarct.  MRA HEAD  Findings: The right vertebral artery is patent to the basilar. Left vertebral artery ends in pica and does not contribute to the basilar.  Decreased signal in the mid basilar could be due to artifact versus moderately severe stenosis.  Superior cerebellar and posterior cerebral arteries are patent bilaterally.  Fetal origin of the right posterior cerebral artery.  The internal carotid artery is patent bilaterally without significant stenosis.  Anterior and middle cerebral arteries are patent bilaterally.  Negative for aneurysm.  IMPRESSION: Focal area of decreased signal in the mid  basilar may be artifact versus a moderate stenosis.  No significant stenosis in the anterior circulation.   Original Report Authenticated By: Janeece Riggers, M.D.   Dg Chest Port 1 View  10/28/2012   *RADIOLOGY REPORT*  Clinical Data: Syncope, shortness of breath  PORTABLE CHEST - 1 VIEW  Comparison: 10/06/2012  Findings: Heart size upper limits normal.  Lungs clear.  No effusion.  No pneumothorax.  Regional bones unremarkable.  IMPRESSION:  Negative   Original Report Authenticated By: D. Andria Rhein, MD   Mr Maxine Glenn Head/brain Wo Cm  10/29/2012   *RADIOLOGY REPORT*  Clinical Data:  Stroke  MRI HEAD WITHOUT CONTRAST MRA HEAD WITHOUT CONTRAST  Technique:  Multiplanar, multiecho pulse sequences of the brain and surrounding structures were obtained without intravenous contrast. Angiographic images of the head were obtained using MRA technique without contrast.  Comparison:  CT 10/28/2012.  MRI 04/04/2012  MRI HEAD  Findings:  Negative for acute infarct.  Chronic hemorrhagic infarcts in the basal ganglia and deep white matter bilaterally.  Chronic ischemia throughout the white matter and pons.  Negative for hydrocephalus.  Negative for mass lesion. No shift of midline structures.  Chronic areas of hemorrhage also in the parietal cortex bilaterally.  These findings suggest chronic hypertension.  Paranasal sinuses are clear.  IMPRESSION: Chronic hemorrhagic infarcts in the basal ganglia bilaterally. Diffuse chronic microvascular ischemic change.  No acute infarct.  MRA HEAD  Findings: The right vertebral artery is patent to the basilar. Left vertebral artery ends in pica and does not contribute to the basilar.  Decreased signal in the mid basilar could be due to artifact versus moderately severe stenosis.  Superior cerebellar and posterior cerebral arteries are patent bilaterally.  Fetal origin of the right posterior cerebral artery.  The internal carotid artery is patent bilaterally without significant stenosis.  Anterior  and middle cerebral arteries are patent bilaterally.  Negative for aneurysm.  IMPRESSION: Focal area of decreased signal in the mid basilar may be artifact versus a moderate stenosis.  No significant stenosis in the anterior circulation.   Original Report Authenticated By: Janeece Riggers, M.D.    Scheduled Meds: . aspirin  325 mg Oral Daily  . citalopram  20 mg Oral q morning - 10a  . doxazosin  8 mg Oral QHS  . enoxaparin (LOVENOX) injection  30 mg Subcutaneous Q24H  . levETIRAcetam  500 mg Oral Q12H  . pantoprazole  20 mg Oral Daily  . sodium bicarbonate  650 mg Oral BID  . tamsulosin  0.4 mg Oral Daily  . topiramate  25 mg Oral QHS  . traZODone  50 mg Oral QHS   Continuous Infusions:   Principal Problem:   Syncope Active Problems:   KIDNEY DISEASE, CHRONIC, STAGE V   Chronic ischemic left MCA stroke   Aphasia as late effect of cerebrovascular accident   Hemiplegia, unspecified, affecting dominant side   Hyperkalemia    Time spent: > 35 mins    [facility]  [facility] Pager [phone]. If 7PM-7AM, please contact night-coverage at [email or web address], password [facility] 10/30/2012, 11:48 AM  LOS: 2 days

## 2012-10-30 NOTE — Progress Notes (Signed)
CM CONSULT  Telephone call to patient's spouse Collin Diaz [phone]); Patient's spouse stated that the discharge plan is home with home health care - patient is active with [facility] for HHRN/PT/SW; he has a wheelchair, walker, bedside commode and has a lift chair that he does not like; Patient/ spouse was given information on [facility] last admission - [facility] is a non-profit, health and human services agency that provides medical and comprehensive support services for individuals 55 and over with complex medical needs. [name] is still completing paperwork for this program. [name] also stated that patient needs ambulance transportation home at discharge; [facility] called and is aware of admission;  Attending MD please order at discharge HHRN/ PT/ SW if in agreement with discharge plan; B Ave Filter RN,BSN,MHA

## 2012-10-31 DIAGNOSIS — N179 Acute kidney failure, unspecified: Secondary | ICD-10-CM

## 2012-10-31 DIAGNOSIS — N189 Chronic kidney disease, unspecified: Secondary | ICD-10-CM

## 2012-10-31 DIAGNOSIS — I69991 Dysphagia following unspecified cerebrovascular disease: Secondary | ICD-10-CM

## 2012-10-31 LAB — BASIC METABOLIC PANEL
BUN: 28 mg/dL — ABNORMAL HIGH (ref 6–23)
CO2: 21 mEq/L (ref 19–32)
Calcium: 9.3 mg/dL (ref 8.4–10.5)
Creatinine, Ser: 2.18 mg/dL — ABNORMAL HIGH (ref 0.50–1.35)
GFR calc non Af Amer: 32 mL/min — ABNORMAL LOW (ref >90)
Glucose, Bld: 142 mg/dL — ABNORMAL HIGH (ref 70–99)

## 2012-10-31 MED ORDER — SODIUM BICARBONATE 650 MG PO TABS: 650.0000 mg | ORAL_TABLET | Freq: Two times a day (BID) | ORAL | Status: DC

## 2012-10-31 MED ORDER — ACETAMINOPHEN 325 MG PO TABS
650.0000 mg | ORAL_TABLET | ORAL | Status: DC | PRN
  Administered 2012-10-31: 650 mg via ORAL
  Filled 2012-10-31: qty 2

## 2012-10-31 MED ORDER — RESOURCE THICKENUP CLEAR PO POWD: 1.0000 | ORAL | Status: DC | PRN

## 2012-10-31 NOTE — Progress Notes (Signed)
Discharge orders received. Pt stable with no complaints. Education completed. Home needs set up. Pt to be discharged home with [facility] via [facility]. Wife at home and notified of ambulance arrival to pick up patient.

## 2012-10-31 NOTE — Discharge Summary (Signed)
Physician Discharge Summary  Collin Diaz ZOX:[id number] DOB: 12/18/1955 DOA: 10/28/2012  PCP: Marletta Lor, NP  Admit date: 10/28/2012 Discharge date: 10/31/2012  Time spent: 65 minutes  Recommendations for Outpatient Follow-up:  1. Patient to follow up with PCP one week post discharge. On followup blood pressure will need to be reassessed of blood pressure medications have been discontinued.  Discharge Diagnoses:  Principal Problem:   Syncope Active Problems:   KIDNEY DISEASE, CHRONIC, STAGE V   Chronic ischemic left MCA stroke   Aphasia as late effect of cerebrovascular accident   Hemiplegia, unspecified, affecting dominant side   Hyperkalemia   Discharge Condition: stable and improved  Diet recommendation: dysphagia 3 with nectar thick liquids  Filed Weights   10/28/12 2356  Weight: 77.429 kg (170 lb 11.2 oz)    History of present illness:  57 yo male h/o multiple cva in past with residual left sided hemiparesis, seizure in the past, htn, ckd comes in with EMS today after having a syncopal event after having a bowel movement on the toilet. He lives at home with his wife, the wife had him on the toilet, he had just had a bm, she left him on the toilet for a few minutes when she arrived back she found him slumped over on toilet passed out. He quickly came too, and was back to normal his baseline. EMS was called, on their arrival his bp was 92/63. Wife reports his bp has been running low lately. Pt denies any cp, no fevers. No recent n/v/d. No abd pain, no le edema or swelling. Back to his baseline neurological status. No sob. Did not fall off the toilet, no head injury.      Hospital Course:  #1 syncope  Likely vasovagal versus hypotension. Patient's blood pressure is noted to be borderline in the 90s to low 100s. CT of the head was negative. MRI of the head was negative for any acute stroke. 2-D echo no source of emboli. Carotid Dopplers with no significant stenosis. Some of  patient's blood pressure medications were discontinued including hydralazine, lisinopril, Lopressor.  Patient improved clinically did not have any further syncopal episodes and the discharged home in stable and improved condition. #2 borderline hypotension  Patient's blood pressure has been in the 90s to low 100. On admission his blood pressure was in the 80s. Discontinued patient's hydralazine, lisinopril, Lopressor. Follow up as outpatient.  #3 history of chronic left MCA CVA  Stable. Stroke workup negative.continued on aspirin.  #4 chronic kidney disease  Stable. Sodium bicarbonate tablets were added. Followup as outpatient.  #5 history of seizures  Continued on Keppra.    Procedures: CT head 10/28/2012  Chest x-ray 10/28/2012  MRI/MRA head 10/29/2012  2-D echo 10/29/2012  Carotid Dopplers 10/29/2012   Consultations:  nONE  Discharge Exam: Filed Vitals:   10/30/12 1740 10/31/12 0143 10/31/12 0554 10/31/12 1006  BP: 126/90 102/71 95/62 100/74  Pulse: 70 69 79 86  Temp: 98.4 F (36.9 C) 97.9 F (36.6 C) 97.5 F (36.4 C) 97.5 F (36.4 C)  TempSrc: Oral Oral Oral Oral  Resp: 18 20 20 18   Height:      Weight:      SpO2: 98% 98% 98% 100%    General: NAD Cardiovascular: RRR Respiratory: CTAB  Discharge Instructions  Discharge Orders   Future Orders Complete By Expires     Diet - low sodium heart healthy  As directed     Comments:      DYSPHAGIA  3 WITH NECTAR THICK LIQUIDS.    Discharge instructions  As directed     Comments:      Follow up with Marletta Lor, NP in 1 week. \    Increase activity slowly  As directed         Medication List    STOP taking these medications       hydrALAZINE 10 MG tablet  Commonly known as:  APRESOLINE     lisinopril 20 MG tablet  Commonly known as:  PRINIVIL,ZESTRIL     metoprolol tartrate 25 MG tablet  Commonly known as:  LOPRESSOR      TAKE these medications       acetaminophen-codeine 300-30 MG per tablet   Commonly known as:  TYLENOL #3  Take 1-2 tablets by mouth every 6 (six) hours as needed for pain.     allopurinol 100 MG tablet  Commonly known as:  ZYLOPRIM  Take 100 mg by mouth 2 (two) times daily.     aspirin 325 MG tablet  Take 325 mg by mouth daily.     citalopram 20 MG tablet  Commonly known as:  CELEXA  Take 20 mg by mouth every morning.     colchicine 0.6 MG tablet  Take 0.6 mg by mouth 2 (two) times daily.     doxazosin 8 MG tablet  Commonly known as:  CARDURA  Take 8 mg by mouth at bedtime.     fluticasone 50 MCG/ACT nasal spray  Commonly known as:  FLONASE  Place 1 spray into the nose daily.     levETIRAcetam 500 MG tablet  Commonly known as:  KEPPRA  Take 500 mg by mouth every 12 (twelve) hours.     oxyCODONE-acetaminophen 5-325 MG per tablet  Commonly known as:  PERCOCET/ROXICET  Take 1 tablet by mouth every 4 (four) hours as needed for pain.     pantoprazole 20 MG tablet  Commonly known as:  PROTONIX  Take 20 mg by mouth daily.     RESOURCE THICKENUP CLEAR Powd  Take 120 g by mouth as needed.     sodium bicarbonate 650 MG tablet  Take 1 tablet (650 mg total) by mouth 2 (two) times daily.     tamsulosin 0.4 MG Caps  Commonly known as:  FLOMAX  Take 0.4 mg by mouth daily.     topiramate 25 MG tablet  Commonly known as:  TOPAMAX  Take 25 mg by mouth at bedtime.     traZODone 50 MG tablet  Commonly known as:  DESYREL  Take 50 mg by mouth at bedtime.       No Known Allergies     Follow-up Information   Follow up with Marletta Lor, NP. Schedule an appointment as soon as possible for a visit in 1 week.   Contact information:   [address] [place] [address] [phone]        The results of significant diagnostics from this hospitalization (including imaging, microbiology, ancillary and laboratory) are listed below for reference.    Significant Diagnostic Studies: Ct Head Wo Contrast  10/28/2012   *RADIOLOGY REPORT*   Clinical Data: Alert but not responding.  Lethargic.  CT HEAD WITHOUT CONTRAST  Technique:  Contiguous axial images were obtained from the base of the skull through the vertex without contrast.  Comparison: 10/17/2012.  Findings:  Calvarium: No acute osseous abnormality.  No lytic or blastic lesion.  Orbits: No acute abnormality.  Brain: No evidence of acute abnormality,  including acute large territory infarction, hemorrhage, hydrocephalus, or mass lesion/mass effect.Extensive, confluent bilateral cerebral white matter low density, consist with chronic small vessel ischemic injury.  Multiple perforator infarcts in the bilateral lenticulostriate distributions, affecting the deep gray nuclei and neighboring white matter tracts.  IMPRESSION: 1.  No evidence of acute hemorrhage or large vessel infarct.   2.  Extensive chronic small vessel ischemic injury with remote perforator infarctions.   Original Report Authenticated By: Tiburcio Pea   Ct Head Wo Contrast  10/17/2012   *RADIOLOGY REPORT*  Clinical Data: Headache.  CT HEAD WITHOUT CONTRAST  Technique:  Contiguous axial images were obtained from the base of the skull through the vertex without contrast.  Comparison: 09/01/2012.  Findings: No mass lesion, mass effect, midline shift, hydrocephalus, hemorrhage.  No acute territorial cortical ischemia/infarct. Atrophy and chronic ischemic white matter disease is present.  Small lacunar infarcts are present which appears similar to the prior exam.  Fluid in the left mastoid air cells. Paranasal sinuses appear within normal limits.  Calvarium intact.  IMPRESSION: Atrophy and chronic ischemic white matter disease without acute intracranial abnormality.  No interval change.  Scattered lacunar infarcts.   Original Report Authenticated By: Andreas Newport, M.D.   Mr Brain Wo Contrast  10/29/2012   *RADIOLOGY REPORT*  Clinical Data:  Stroke  MRI HEAD WITHOUT CONTRAST MRA HEAD WITHOUT CONTRAST  Technique:  Multiplanar,  multiecho pulse sequences of the brain and surrounding structures were obtained without intravenous contrast. Angiographic images of the head were obtained using MRA technique without contrast.  Comparison:  CT 10/28/2012.  MRI 04/04/2012  MRI HEAD  Findings:  Negative for acute infarct.  Chronic hemorrhagic infarcts in the basal ganglia and deep white matter bilaterally.  Chronic ischemia throughout the white matter and pons.  Negative for hydrocephalus.  Negative for mass lesion. No shift of midline structures.  Chronic areas of hemorrhage also in the parietal cortex bilaterally.  These findings suggest chronic hypertension.  Paranasal sinuses are clear.  IMPRESSION: Chronic hemorrhagic infarcts in the basal ganglia bilaterally. Diffuse chronic microvascular ischemic change.  No acute infarct.  MRA HEAD  Findings: The right vertebral artery is patent to the basilar. Left vertebral artery ends in pica and does not contribute to the basilar.  Decreased signal in the mid basilar could be due to artifact versus moderately severe stenosis.  Superior cerebellar and posterior cerebral arteries are patent bilaterally.  Fetal origin of the right posterior cerebral artery.  The internal carotid artery is patent bilaterally without significant stenosis.  Anterior and middle cerebral arteries are patent bilaterally.  Negative for aneurysm.  IMPRESSION: Focal area of decreased signal in the mid basilar may be artifact versus a moderate stenosis.  No significant stenosis in the anterior circulation.   Original Report Authenticated By: Janeece Riggers, M.D.   Dg Chest Port 1 View  10/28/2012   *RADIOLOGY REPORT*  Clinical Data: Syncope, shortness of breath  PORTABLE CHEST - 1 VIEW  Comparison: 10/06/2012  Findings: Heart size upper limits normal.  Lungs clear.  No effusion.  No pneumothorax.  Regional bones unremarkable.  IMPRESSION:  Negative   Original Report Authenticated By: D. Andria Rhein, MD   Dg Chest Portable 1  View  10/06/2012   *RADIOLOGY REPORT*  Clinical Data: Short of breath and cough  PORTABLE CHEST - 1 VIEW  Comparison: 04/04/2012  Findings: Cardiac enlargement without heart failure.  Mild atelectasis in the lung bases.  Negative for effusion or pneumonia.  IMPRESSION: Cardiac enlargement.  Mild bibasilar atelectasis.   Original Report Authenticated By: Janeece Riggers, M.D.   Mr Mra Head/brain Wo Cm  10/29/2012   *RADIOLOGY REPORT*  Clinical Data:  Stroke  MRI HEAD WITHOUT CONTRAST MRA HEAD WITHOUT CONTRAST  Technique:  Multiplanar, multiecho pulse sequences of the brain and surrounding structures were obtained without intravenous contrast. Angiographic images of the head were obtained using MRA technique without contrast.  Comparison:  CT 10/28/2012.  MRI 04/04/2012  MRI HEAD  Findings:  Negative for acute infarct.  Chronic hemorrhagic infarcts in the basal ganglia and deep white matter bilaterally.  Chronic ischemia throughout the white matter and pons.  Negative for hydrocephalus.  Negative for mass lesion. No shift of midline structures.  Chronic areas of hemorrhage also in the parietal cortex bilaterally.  These findings suggest chronic hypertension.  Paranasal sinuses are clear.  IMPRESSION: Chronic hemorrhagic infarcts in the basal ganglia bilaterally. Diffuse chronic microvascular ischemic change.  No acute infarct.  MRA HEAD  Findings: The right vertebral artery is patent to the basilar. Left vertebral artery ends in pica and does not contribute to the basilar.  Decreased signal in the mid basilar could be due to artifact versus moderately severe stenosis.  Superior cerebellar and posterior cerebral arteries are patent bilaterally.  Fetal origin of the right posterior cerebral artery.  The internal carotid artery is patent bilaterally without significant stenosis.  Anterior and middle cerebral arteries are patent bilaterally.  Negative for aneurysm.  IMPRESSION: Focal area of decreased signal in the mid  basilar may be artifact versus a moderate stenosis.  No significant stenosis in the anterior circulation.   Original Report Authenticated By: Janeece Riggers, M.D.    Microbiology: No results found for this or any previous visit (from the past 240 hour(s)).   Labs: Basic Metabolic Panel:  Recent Labs Lab 10/28/12 1802 10/28/12 1834 10/29/12 0555 10/30/12 0502 10/31/12 0850  NA 140 142 143 144 143  K 6.0* 6.0* 4.3 4.3 4.1  CL 109 115* 113* 116* 111  CO2 22  --  17* 18* 21  GLUCOSE 92 89 87 85 142*  BUN 40* 40* 37* 34* 28*  CREATININE 2.86* 2.70* 2.82* 2.44* 2.18*  CALCIUM 9.9  --  9.6 9.5 9.3   Liver Function Tests: No results found for this basename: AST, ALT, ALKPHOS, BILITOT, PROT, ALBUMIN,  in the last 168 hours No results found for this basename: LIPASE, AMYLASE,  in the last 168 hours No results found for this basename: AMMONIA,  in the last 168 hours CBC:  Recent Labs Lab 10/28/12 1807 10/28/12 1834  WBC 2.9*  --   NEUTROABS 1.2*  --   HGB 11.8* 13.3  HCT 35.2* 39.0  MCV 88.4  --   PLT 104*  --    Cardiac Enzymes:  Recent Labs Lab 10/29/12 0129  TROPONINI <0.30   BNP: BNP (last 3 results)  Recent Labs  04/04/12 0633 10/06/12 2222  PROBNP 116.6 321.5*   CBG:  Recent Labs Lab 10/28/12 1823  GLUCAP 82       Signed:  Llewellyn Schoenberger  [facility] 10/31/2012, 12:24 PM

## 2012-10-31 NOTE — Care Management Note (Signed)
    Page 1 of 2   10/31/2012     4:23:14 PM   CARE MANAGEMENT NOTE 10/31/2012  Patient:  Collin Diaz, Collin Diaz   Account Number:  [id number]  Date Initiated:  10/29/2012  Documentation initiated by:  Jiles Crocker  Subjective/Objective Assessment:   ADMITTED WITH SYNCOPY     Action/Plan:   PCP:  Marletta Lor, NP  LIVES AT HOME WITH SPOUSE; CM FOLLOWING FOR DCP   Anticipated DC Date:  11/01/2012   Anticipated DC Plan:  HOME/SELF CARE      DC Planning Services  CM consult      Choice offered to / List presented to:  C-3 Spouse        HH arranged  HH-1 RN  HH-2 PT  HH-3 OT  HH-6 SOCIAL WORKER      HH agency  [facility]   Status of service:  In process, will continue to follow Medicare Important Message given?  NA - LOS <3 / Initial given by admissions (If response is "NO", the following Medicare IM given date fields will be blank) Date Medicare IM given:   Date Additional Medicare IM given:    Discharge Disposition:  HOME W HOME HEALTH SERVICES  Per UR Regulation:  Reviewed for med. necessity/level of care/duration of stay  If discussed at Long Length of Stay Meetings, dates discussed:    Comments:  10/31/12 1430 Elmer Bales RN, MSN, CM-  Spoke with Corrie Dandy with [facility] to notify that patient will be discharging home.  Pt was being seen by [facility] prior to admission, which wife has chosen to resume. Pt's RN updated and given phone number to call [facility] when ready to discharge.   10/30/12 1350 Elmer Bales RN, MSN CM- Voicemail left for patient's wife to discuss discharge planning. Pt is reportedly from home with wife, total care.  CM intends to discuss if plan is to return home.  CSW aware of planned discussion.  10/30/2012- Telephone call to patient's spouse Ravindra Baranek [phone]); Patient's spouse stated that the discharge plan is home with home health care - patient is active with [facility] for HHRN/PT/SW; he has a wheelchair, walker, bedside  commode and has a lift chair that he does not like; Patient/ spouse was given information on [facility] last admission - [facility] is a non-profit, health and human services agency that provides medical and comprehensive support services for individuals 55 and over with complex medical needs. [name] is still completing paperwork for this program. [name] also stated that patient needs ambulance transportation home at discharge; [facility] called and is aware of admission; Attending MD please order at discharge HHRN/ PT/ SW if in agreement with discharge plan; B Shelba Flake  10/29/2012- B CHANDLER RN,BSN,MHA

## 2012-10-31 NOTE — Progress Notes (Signed)
Per Attending MD, patient is to be discharged home today; attempted to contact patient's wife Alexandro Line [phone]), voice message left to contact CM. Soc Worker referral placed for transportation home/ ambulance; B [employer]

## 2012-10-31 NOTE — Progress Notes (Signed)
PT Cancellation Note  Patient Details Name: QUEST TAVENNER MRN: [id number] DOB: [birth year]   Cancelled Treatment:     Noted plans for patient d/c home today via ambulance transport. Will defer further therapies to [facility] services.    [facility] HELEN 10/31/2012, 1:27 PM Pager: [phone]

## 2012-11-07 ENCOUNTER — Emergency Department (HOSPITAL_COMMUNITY): Payer: Medicaid Other

## 2012-11-07 ENCOUNTER — Emergency Department (HOSPITAL_COMMUNITY)
Admission: EM | Admit: 2012-11-07 | Discharge: 2012-11-07 | Disposition: A | Discharge status: Home / Self Care | Payer: Medicaid Other | Attending: Emergency Medicine | Admitting: Emergency Medicine

## 2012-11-07 ENCOUNTER — Encounter (HOSPITAL_COMMUNITY): Payer: Self-pay | Admitting: *Deleted

## 2012-11-07 DIAGNOSIS — R6883 Chills (without fever): Secondary | ICD-10-CM | POA: Insufficient documentation

## 2012-11-07 DIAGNOSIS — I10 Essential (primary) hypertension: Secondary | ICD-10-CM

## 2012-11-07 DIAGNOSIS — R079 Chest pain, unspecified: Secondary | ICD-10-CM

## 2012-11-07 DIAGNOSIS — Z8673 Personal history of transient ischemic attack (TIA), and cerebral infarction without residual deficits: Secondary | ICD-10-CM | POA: Insufficient documentation

## 2012-11-07 DIAGNOSIS — F329 Major depressive disorder, single episode, unspecified: Secondary | ICD-10-CM | POA: Insufficient documentation

## 2012-11-07 DIAGNOSIS — G40909 Epilepsy, unspecified, not intractable, without status epilepticus: Secondary | ICD-10-CM | POA: Insufficient documentation

## 2012-11-07 DIAGNOSIS — G8190 Hemiplegia, unspecified affecting unspecified side: Secondary | ICD-10-CM | POA: Insufficient documentation

## 2012-11-07 DIAGNOSIS — R0602 Shortness of breath: Secondary | ICD-10-CM | POA: Insufficient documentation

## 2012-11-07 DIAGNOSIS — F3289 Other specified depressive episodes: Secondary | ICD-10-CM | POA: Insufficient documentation

## 2012-11-07 DIAGNOSIS — G8194 Hemiplegia, unspecified affecting left nondominant side: Secondary | ICD-10-CM

## 2012-11-07 DIAGNOSIS — N189 Chronic kidney disease, unspecified: Secondary | ICD-10-CM | POA: Insufficient documentation

## 2012-11-07 DIAGNOSIS — M1990 Unspecified osteoarthritis, unspecified site: Secondary | ICD-10-CM

## 2012-11-07 DIAGNOSIS — Z79899 Other long term (current) drug therapy: Secondary | ICD-10-CM | POA: Insufficient documentation

## 2012-11-07 DIAGNOSIS — I129 Hypertensive chronic kidney disease with stage 1 through stage 4 chronic kidney disease, or unspecified chronic kidney disease: Secondary | ICD-10-CM | POA: Insufficient documentation

## 2012-11-07 DIAGNOSIS — N390 Urinary tract infection, site not specified: Secondary | ICD-10-CM | POA: Insufficient documentation

## 2012-11-07 DIAGNOSIS — Z8669 Personal history of other diseases of the nervous system and sense organs: Secondary | ICD-10-CM | POA: Insufficient documentation

## 2012-11-07 DIAGNOSIS — R05 Cough: Secondary | ICD-10-CM | POA: Insufficient documentation

## 2012-11-07 DIAGNOSIS — R0789 Other chest pain: Secondary | ICD-10-CM | POA: Insufficient documentation

## 2012-11-07 DIAGNOSIS — M129 Arthropathy, unspecified: Secondary | ICD-10-CM | POA: Insufficient documentation

## 2012-11-07 DIAGNOSIS — Z8679 Personal history of other diseases of the circulatory system: Secondary | ICD-10-CM | POA: Insufficient documentation

## 2012-11-07 DIAGNOSIS — Z87891 Personal history of nicotine dependence: Secondary | ICD-10-CM | POA: Insufficient documentation

## 2012-11-07 DIAGNOSIS — R059 Cough, unspecified: Secondary | ICD-10-CM | POA: Insufficient documentation

## 2012-11-07 LAB — COMPREHENSIVE METABOLIC PANEL
ALT: 154 U/L — ABNORMAL HIGH (ref 0–53)
AST: 76 U/L — ABNORMAL HIGH (ref 0–37)
CO2: 24 mEq/L (ref 19–32)
Calcium: 9.2 mg/dL (ref 8.4–10.5)
Sodium: 138 mEq/L (ref 135–145)
Total Protein: 6.2 g/dL (ref 6.0–8.3)

## 2012-11-07 LAB — CBC WITH DIFFERENTIAL/PLATELET
Eosinophils Absolute: 0.1 10*3/uL (ref 0.0–0.7)
Eosinophils Relative: 3 % (ref 0–5)
Lymphocytes Relative: 24 % (ref 12–46)
MCH: 29 pg (ref 26.0–34.0)
Monocytes Absolute: 0.5 10*3/uL (ref 0.1–1.0)
Neutrophils Relative %: 62 % (ref 43–77)
Platelets: 119 10*3/uL — ABNORMAL LOW (ref 150–400)
RBC: 3.72 MIL/uL — ABNORMAL LOW (ref 4.22–5.81)
WBC: 4.9 10*3/uL (ref 4.0–10.5)

## 2012-11-07 LAB — URINALYSIS, ROUTINE W REFLEX MICROSCOPIC
Glucose, UA: NEGATIVE mg/dL
Specific Gravity, Urine: 1.01 (ref 1.005–1.030)

## 2012-11-07 LAB — POCT I-STAT TROPONIN I
Troponin i, poc: 0.01 ng/mL (ref 0.00–0.08)
Troponin i, poc: 0.01 ng/mL (ref 0.00–0.08)

## 2012-11-07 LAB — URINE MICROSCOPIC-ADD ON

## 2012-11-07 MED ORDER — SODIUM CHLORIDE 0.9 % IV BOLUS (SEPSIS)
1000.0000 mL | Freq: Once | INTRAVENOUS | Status: AC
  Administered 2012-11-07: 1000 mL via INTRAVENOUS

## 2012-11-07 MED ORDER — NITROGLYCERIN 0.4 MG SL SUBL: 0.4000 mg | SUBLINGUAL_TABLET | SUBLINGUAL | Status: DC | PRN

## 2012-11-07 MED ORDER — SULFAMETHOXAZOLE-TRIMETHOPRIM 800-160 MG PO TABS: 1.0000 | ORAL_TABLET | Freq: Two times a day (BID) | ORAL | Status: DC

## 2012-11-07 MED ORDER — TECHNETIUM TO 99M ALBUMIN AGGREGATED
6.0000 | Freq: Once | INTRAVENOUS | Status: AC | PRN
  Administered 2012-11-07: 6 via INTRAVENOUS

## 2012-11-07 MED ORDER — ASPIRIN 325 MG PO TABS
325.0000 mg | ORAL_TABLET | Freq: Once | ORAL | Status: AC
  Administered 2012-11-07: 325 mg via ORAL
  Filled 2012-11-07: qty 1

## 2012-11-07 MED ORDER — IBUPROFEN 100 MG/5ML PO SUSP
ORAL | Status: AC
  Filled 2012-11-07: qty 30

## 2012-11-07 MED ORDER — TECHNETIUM TC 99M DIETHYLENETRIAME-PENTAACETIC ACID: 40.0000 | Freq: Once | INTRAVENOUS | Status: DC | PRN

## 2012-11-07 MED ORDER — DEXTROSE 5 % IV SOLN
1.0000 g | Freq: Once | INTRAVENOUS | Status: AC
  Administered 2012-11-07: 1 g via INTRAVENOUS
  Filled 2012-11-07: qty 10

## 2012-11-07 NOTE — ED Notes (Signed)
Pt c/o non prod cough & left side CP only with coughing x 3 days. Denies SOB, n/v/d, fever, chills. Nurse aid arrived with pt, stated pt's BP today was normal 137/80 & ate all of his breakfast.  Pt presently denies any CP, resp e/u, no distress

## 2012-11-07 NOTE — ED Notes (Signed)
Patient transported to X-ray 

## 2012-11-07 NOTE — ED Notes (Signed)
Transported to nuclear medicine.  

## 2012-11-07 NOTE — ED Notes (Signed)
Family reports pt been lethargic x 1 week since discharge & today was hypotensive BP 90/60. Pt c/o cough x 1 week

## 2012-11-07 NOTE — ED Provider Notes (Signed)
History    CSN: [id number] Arrival date & time 11/07/12  1043  First MD Initiated Contact with Patient 11/07/12 1108     Chief Complaint  Patient presents with  . Fatigue  . Cough   (Consider location/radiation/quality/duration/timing/severity/associated sxs/prior Treatment) The history is provided by the patient and a friend. No language interpreter was used.  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 Reported that he took ASA this morning, one tablet. Reported that he has been having shortness of breathe. Stated that he has been having the chills yesterday and reported that he has been having a dry cough starting yesterday. Denied fever, abdominal pain, nausea, vomiting, diarrhea, melena, hematochezia, numbness, tingling, nasal congestion, eye pain, ear pain.  PCP Dr. Marletta Lor      Past Medical History  Diagnosis Date  . Hypertension   . Arthritis 04/04/2012  . Seizure 04/04/2012    POST CVA 12/2011  . RSD (reflex sympathetic dystrophy) 04/04/2012    LEFT HAND  . Dysphagia S/P CVA (cerebrovascular accident) 04/04/2012  . Aphasia 04/04/2012    HX OF POST cva  . Stroke 10/2010    LEFT mca cva ISCHEMIC  . 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 Chronic kidney disease   . Depression   . RSD (reflex sympathetic dystrophy) 2013    LEFT HAND   Past Surgical History  Procedure Laterality Date  . No past surgeries     No family history on file. History  Substance Use Topics  . Smoking status: Former Smoker -- 30 years    Types: Cigarettes    Quit date: 04/18/1991  . Smokeless  tobacco: Never Used  . Alcohol Use: Yes     Comment: OCCASSIONAL    Review of Systems  Constitutional: Positive for chills. Negative for fever.  HENT: Negative for ear pain and congestion.   Eyes: Negative for visual disturbance.  Respiratory: Positive for shortness of breath. Negative for chest tightness.   Cardiovascular: Positive for chest pain.  Gastrointestinal: Negative for nausea, vomiting, abdominal pain, diarrhea, blood in stool and anal bleeding.  Neurological: Positive for weakness (baseline for patient, hemiparesis to the left side secondary to strokes). Negative for dizziness, numbness and headaches.  All other systems reviewed and are negative.    Allergies  Review of patient's allergies indicates no known allergies.  Home Medications   Current Outpatient Rx  Name  Route  Sig  Dispense  Refill  . acetaminophen-codeine (TYLENOL #3) 300-30 MG per tablet   Oral   Take 1-2 tablets by mouth every 6 (six) hours as needed for pain.   15 tablet   0   . allopurinol (ZYLOPRIM) 100 MG tablet   Oral   Take 100 mg by mouth 2 (two) times daily.         Marland Kitchen aspirin 325 MG tablet   Oral   Take 325 mg by mouth daily.         . citalopram (CELEXA) 20 MG tablet   Oral   Take 20 mg by mouth every morning.          Marland Kitchen  colchicine 0.6 MG tablet   Oral   Take 0.6 mg by mouth 2 (two) times daily.         Marland Kitchen doxazosin (CARDURA) 8 MG tablet   Oral   Take 8 mg by mouth at bedtime.         . fluticasone (FLONASE) 50 MCG/ACT nasal spray   Nasal   Place 1 spray into the nose daily.         Marland Kitchen levETIRAcetam (KEPPRA) 500 MG tablet   Oral   Take 500 mg by mouth every 12 (twelve) hours.         . Maltodextrin-Xanthan Gum (RESOURCE THICKENUP CLEAR) POWD   Oral   Take 120 g by mouth as needed.         Marland Kitchen oxyCODONE-acetaminophen (PERCOCET/ROXICET) 5-325 MG per tablet   Oral   Take 1 tablet by mouth every 4 (four) hours as needed for pain.         . pantoprazole  (PROTONIX) 20 MG tablet   Oral   Take 20 mg by mouth daily.         . sodium bicarbonate 650 MG tablet   Oral   Take 1 tablet (650 mg total) by mouth 2 (two) times daily.   62 tablet   0   . tamsulosin (FLOMAX) 0.4 MG CAPS   Oral   Take 0.4 mg by mouth daily.         Marland Kitchen topiramate (TOPAMAX) 25 MG tablet   Oral   Take 25 mg by mouth at bedtime.         . traZODone (DESYREL) 50 MG tablet   Oral   Take 50 mg by mouth at bedtime.         . sulfamethoxazole-trimethoprim (BACTRIM DS,SEPTRA DS) 800-160 MG per tablet   Oral   Take 1 tablet by mouth 2 (two) times daily. One po bid x 7 days   14 tablet   0    BP 150/96  Pulse 62  Temp(Src) 98 F (36.7 C) (Oral)  Resp 20  SpO2 100% Physical Exam  Nursing note and vitals reviewed. Constitutional: He is oriented to person, place, and time. He appears well-developed and well-nourished. No distress.  HENT:  Head: Normocephalic and atraumatic.  Eyes: Conjunctivae and EOM are normal. Pupils are equal, round, and reactive to light. Right eye exhibits no discharge. Left eye exhibits no discharge.  Neck: Normal range of motion. Neck supple.  Cardiovascular: Normal rate, regular rhythm and normal heart sounds.  Exam reveals no friction rub.   No murmur heard. Pulses:      Radial pulses are 2+ on the right side, and 2+ on the left side.       Dorsalis pedis pulses are 2+ on the right side, and 2+ on the left side.  Pulmonary/Chest: Effort normal and breath sounds normal. No respiratory distress. He has no wheezes. He has no rales. He exhibits tenderness.    Pain reproducible upon palpation   Lymphadenopathy:    He has no cervical adenopathy.  Neurological: He is alert and oriented to person, place, and time. No cranial nerve deficit. He exhibits normal muscle tone. Coordination normal.  Cranial nerves III-XII grossly intact  Left sided hemiparesis secondary to stroke consequences.  Strength 5+/5+ to the right side  Skin:  Skin is warm and dry. No rash noted. He is not diaphoretic. No erythema.  Psychiatric: He has a normal mood and affect. His behavior is normal. Thought  content normal.    ED Course  Procedures (including critical care time)  4:05PM Patient reporting chest pain upon arrival back from nuclear medicine, with radiation to the left arm. Went to go assess patient - patient was on the phone with the wife and would not get off.    Date: 11/07/2012  Rate: 85  Rhythm: normal sinus rhythm  QRS Axis: normal  Intervals: normal  ST/T Wave abnormalities: normal  Conduction Disutrbances:right bundle branch block and left anterior fascicular block  Narrative Interpretation:   Old EKG Reviewed: changes noted RBBB new   Labs Reviewed  CBC WITH DIFFERENTIAL - Abnormal; Notable for the following:    RBC 3.72 (*)    Hemoglobin 10.8 (*)    HCT 32.2 (*)    Platelets 119 (*)    All other components within normal limits  COMPREHENSIVE METABOLIC PANEL - Abnormal; Notable for the following:    Glucose, Bld 115 (*)    Creatinine, Ser 1.73 (*)    Albumin 3.0 (*)    AST 76 (*)    ALT 154 (*)    Alkaline Phosphatase 168 (*)    GFR calc non Af Amer 42 (*)    GFR calc Af Amer 49 (*)    All other components within normal limits  D-DIMER, QUANTITATIVE - Abnormal; Notable for the following:    D-Dimer, Quant 1.47 (*)    All other components within normal limits  URINALYSIS, ROUTINE W REFLEX MICROSCOPIC - Abnormal; Notable for the following:    APPearance CLOUDY (*)    Hgb urine dipstick SMALL (*)    Leukocytes, UA LARGE (*)    All other components within normal limits  URINE MICROSCOPIC-ADD ON - Abnormal; Notable for the following:    Squamous Epithelial / LPF FEW (*)    Bacteria, UA MANY (*)    Casts HYALINE CASTS (*)    All other components within normal limits  URINE CULTURE  POCT I-STAT TROPONIN I   Dg Chest 2 View  11/07/2012   *RADIOLOGY REPORT*  Clinical Data: Fatigue and cough  CHEST - 2  VIEW  Comparison: 10/28/2012  Findings: Cardiac shadow is stable.  The lungs are well-aerated bilaterally.  No focal infiltrate or effusion is noted.  No bony abnormality is seen.  IMPRESSION: No acute abnormality noted.   Original Report Authenticated By: Alcide Clever, M.D.   Nm Pulmonary Perf And Vent  11/07/2012   *RADIOLOGY REPORT*  Clinical Data:  Elevated D-dimer, fatigue and cough  NUCLEAR MEDICINE VENTILATION - PERFUSION LUNG SCAN  Technique:  Wash-in, equilibrium, and wash-out phase ventilation images were obtained using aerosolized technetium DTPA.  Perfusion images were obtained in multiple projections after intravenous injection of Tc-78m MAA.  Radiopharmaceuticals:  40 mCi aerosolized technetium DTPA and 6 mCi Tc-45m MAA.  Comparison:  Chest radiograph - 11/07/2012; 04/04/2029  Findings:  Review of chest radiograph performed earlier same day demonstrates enlarged cardiac silhouette and mediastinal contour.  There is a possible developing air space opacity within the peripheral aspect the right mid lung.  No definite pleural effusion or pneumothorax. No definite evidence of edema.  Ventilatory images:  There is a relative homogeneous distribution of inhaled radiotracer throughout the pulmonary parenchyma with minimal clumping about the left hilum.  There is a minimal amount of retained radiotracer within the mouth.  Perfusion images: There is relative homogeneous perfusion of the bilateral pulmonary parenchyma.  There are no discrete segmental mismatched filling defects to suggest pulmonary embolism.  IMPRESSION: Pulmonary  embolism absent (very low probability for pulmonary embolism).   Original Report Authenticated By: Tacey Ruiz, MD   1. UTI (urinary tract infection)   2. Chest pain   3. HTN (hypertension)   4. Arthritis   5. Left hemiparesis     MDM  Patient presenting to the ED with chest pain to the left side with shortness of breath. Upon arrival to the ED patient had a drop in blood  pressure to 91/67 suddenly, hypotensive. Heart normal. Lungs clear. Pain reproducible upon palpation to the left side of the chest wall. Cap refill < 3 seconds. Discussed case with Dr. Victorino December due to shortness of breath, chest pain, hypotensive episode - need to rule out PE.  EKG noted changes with RBBB that was not seen in previous EKG, negative ischemic changes noted. Negative elevation of first and second set of troponins. CBC negative findings noted. CMP elevated Cr - decrease noted from 1 week ago (2.18) to today (1.73) - improved Cr. Chest xray negative acute cardiopulmonary findings noted. Elevated d-dimer of 1.47 - VQ scan ordered. UA elevated WBC and many bacteria noted to the urine - positive UTI. VQ scan performed and negative for PE.  Patient stable, afebrile, in no acute distress. Patient's chest discomfort controlled in ED setting - pain reproducible upon palpation to the left side. Negative findings for acute cardiac issue. Suspicion to be costochondritis due to patient reporting pain with cough and pain with palpation. Negative PE. Doubt stroke like symptoms. Discussed case with Dr. Theodoro Grist - cleared patient for discharge. Discharged patient with antibiotic therapy for UTI. Discussed with patient to continue to take ASA 81 mg daily. Discussed with patient to follow-up with PCP and cardiology - echo and stress test to be performed. Discussed with patient to drink a lot of water and to rest. Discussed with patient to continue to monitor symptoms and if symptoms are to worsen or change to report back to the ED - strict return instructions given.  Patient agreed to plan of care, understood, all questions answered.                        Raymon Mutton, PA-C 11/07/12 1728

## 2012-11-07 NOTE — ED Notes (Addendum)
Pt turned to left side for comfort. No voiced complaints. Spoke to pt's wife on phone, updated on status. Contact number for wife, Jan  [phone]

## 2012-11-07 NOTE — ED Notes (Signed)
ED PA at bedside. Pt c/o left shoulder pain & not chest pain, stated pain is always there.

## 2012-11-07 NOTE — ED Notes (Signed)
[facility] called for transportation  

## 2012-11-09 LAB — URINE CULTURE: Colony Count: >=100000

## 2012-11-10 ENCOUNTER — Telehealth (HOSPITAL_COMMUNITY): Payer: Self-pay | Admitting: Emergency Medicine

## 2012-11-10 NOTE — ED Notes (Signed)
Post ED Visit - Positive Culture Follow-up  Culture report reviewed by antimicrobial stewardship pharmacist: []  Wes Dulaney, Pharm.D., BCPS [x]  Celedonio Miyamoto, Pharm.D., BCPS []  Georgina Pillion, Pharm.D., BCPS []  B[place], [address].D., BCPS, AAHIVP []  Estella Husk, Pharm.D., BCPS, AAHIVP  Positive urine culture Treated with Sulfa-Trimeth, organism sensitive to the same and no further patient follow-up is required at this time.  Kylie A Holland 11/10/2012, 2:47 PM

## 2012-11-10 NOTE — ED Provider Notes (Signed)
Medical screening examination/treatment/procedure(s) were conducted as a shared visit with non-physician practitioner(s) and myself.  I personally evaluated the patient during the encounter  Derwood Kaplan, MD 11/10/12 1722

## 2013-01-26 ENCOUNTER — Emergency Department (HOSPITAL_COMMUNITY): Payer: Medicaid Other

## 2013-01-26 ENCOUNTER — Encounter (HOSPITAL_COMMUNITY): Payer: Self-pay | Admitting: Emergency Medicine

## 2013-01-26 ENCOUNTER — Observation Stay (HOSPITAL_COMMUNITY)
Admission: EM | Admit: 2013-01-26 | Discharge: 2013-01-28 | Disposition: A | Discharge status: Home / Self Care | Payer: Medicaid Other | Attending: Internal Medicine | Admitting: Internal Medicine

## 2013-01-26 DIAGNOSIS — G40909 Epilepsy, unspecified, not intractable, without status epilepticus: Secondary | ICD-10-CM | POA: Insufficient documentation

## 2013-01-26 DIAGNOSIS — E66811 Obesity, class 1: Secondary | ICD-10-CM

## 2013-01-26 DIAGNOSIS — G4733 Obstructive sleep apnea (adult) (pediatric): Secondary | ICD-10-CM | POA: Diagnosis present

## 2013-01-26 DIAGNOSIS — M24549 Contracture, unspecified hand: Secondary | ICD-10-CM | POA: Insufficient documentation

## 2013-01-26 DIAGNOSIS — I69920 Aphasia following unspecified cerebrovascular disease: Secondary | ICD-10-CM | POA: Insufficient documentation

## 2013-01-26 DIAGNOSIS — F32A Depression, unspecified: Secondary | ICD-10-CM

## 2013-01-26 DIAGNOSIS — Z87891 Personal history of nicotine dependence: Secondary | ICD-10-CM | POA: Insufficient documentation

## 2013-01-26 DIAGNOSIS — Z79899 Other long term (current) drug therapy: Secondary | ICD-10-CM | POA: Insufficient documentation

## 2013-01-26 DIAGNOSIS — I6350 Cerebral infarction due to unspecified occlusion or stenosis of unspecified cerebral artery: Secondary | ICD-10-CM | POA: Insufficient documentation

## 2013-01-26 DIAGNOSIS — I610 Nontraumatic intracerebral hemorrhage in hemisphere, subcortical: Secondary | ICD-10-CM

## 2013-01-26 DIAGNOSIS — F3289 Other specified depressive episodes: Secondary | ICD-10-CM | POA: Insufficient documentation

## 2013-01-26 DIAGNOSIS — I69991 Dysphagia following unspecified cerebrovascular disease: Secondary | ICD-10-CM | POA: Insufficient documentation

## 2013-01-26 DIAGNOSIS — J309 Allergic rhinitis, unspecified: Secondary | ICD-10-CM

## 2013-01-26 DIAGNOSIS — M1990 Unspecified osteoarthritis, unspecified site: Secondary | ICD-10-CM

## 2013-01-26 DIAGNOSIS — I129 Hypertensive chronic kidney disease with stage 1 through stage 4 chronic kidney disease, or unspecified chronic kidney disease: Secondary | ICD-10-CM | POA: Insufficient documentation

## 2013-01-26 DIAGNOSIS — N189 Chronic kidney disease, unspecified: Secondary | ICD-10-CM | POA: Insufficient documentation

## 2013-01-26 DIAGNOSIS — I6340 Cerebral infarction due to embolism of unspecified cerebral artery: Secondary | ICD-10-CM

## 2013-01-26 DIAGNOSIS — I69959 Hemiplegia and hemiparesis following unspecified cerebrovascular disease affecting unspecified side: Secondary | ICD-10-CM | POA: Insufficient documentation

## 2013-01-26 DIAGNOSIS — G90519 Complex regional pain syndrome I of unspecified upper limb: Secondary | ICD-10-CM | POA: Insufficient documentation

## 2013-01-26 DIAGNOSIS — R0789 Other chest pain: Principal | ICD-10-CM | POA: Insufficient documentation

## 2013-01-26 DIAGNOSIS — Z7982 Long term (current) use of aspirin: Secondary | ICD-10-CM | POA: Insufficient documentation

## 2013-01-26 DIAGNOSIS — R55 Syncope and collapse: Secondary | ICD-10-CM | POA: Diagnosis present

## 2013-01-26 DIAGNOSIS — I69320 Aphasia following cerebral infarction: Secondary | ICD-10-CM

## 2013-01-26 DIAGNOSIS — F329 Major depressive disorder, single episode, unspecified: Secondary | ICD-10-CM

## 2013-01-26 DIAGNOSIS — N185 Chronic kidney disease, stage 5: Secondary | ICD-10-CM | POA: Diagnosis present

## 2013-01-26 DIAGNOSIS — R7989 Other specified abnormal findings of blood chemistry: Secondary | ICD-10-CM | POA: Diagnosis present

## 2013-01-26 DIAGNOSIS — I6930 Unspecified sequelae of cerebral infarction: Secondary | ICD-10-CM

## 2013-01-26 DIAGNOSIS — G8190 Hemiplegia, unspecified affecting unspecified side: Secondary | ICD-10-CM | POA: Diagnosis present

## 2013-01-26 DIAGNOSIS — E669 Obesity, unspecified: Secondary | ICD-10-CM

## 2013-01-26 DIAGNOSIS — R079 Chest pain, unspecified: Secondary | ICD-10-CM | POA: Diagnosis present

## 2013-01-26 DIAGNOSIS — Z8673 Personal history of transient ischemic attack (TIA), and cerebral infarction without residual deficits: Secondary | ICD-10-CM

## 2013-01-26 DIAGNOSIS — G9050 Complex regional pain syndrome I, unspecified: Secondary | ICD-10-CM

## 2013-01-26 DIAGNOSIS — M129 Arthropathy, unspecified: Secondary | ICD-10-CM | POA: Insufficient documentation

## 2013-01-26 DIAGNOSIS — R569 Unspecified convulsions: Secondary | ICD-10-CM | POA: Diagnosis present

## 2013-01-26 DIAGNOSIS — I10 Essential (primary) hypertension: Secondary | ICD-10-CM | POA: Diagnosis present

## 2013-01-26 DIAGNOSIS — I69391 Dysphagia following cerebral infarction: Secondary | ICD-10-CM

## 2013-01-26 DIAGNOSIS — M109 Gout, unspecified: Secondary | ICD-10-CM
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
## 2013-01-27 ENCOUNTER — Emergency Department (HOSPITAL_COMMUNITY): Payer: Medicaid Other

## 2013-01-27 ENCOUNTER — Observation Stay (HOSPITAL_COMMUNITY): Payer: Medicaid Other

## 2013-01-27 ENCOUNTER — Encounter (HOSPITAL_COMMUNITY): Payer: Self-pay | Admitting: Internal Medicine

## 2013-01-27 DIAGNOSIS — R55 Syncope and collapse: Secondary | ICD-10-CM | POA: Diagnosis present

## 2013-01-27 DIAGNOSIS — I10 Essential (primary) hypertension: Secondary | ICD-10-CM | POA: Diagnosis present

## 2013-01-27 DIAGNOSIS — R569 Unspecified convulsions: Secondary | ICD-10-CM

## 2013-01-27 DIAGNOSIS — I517 Cardiomegaly: Secondary | ICD-10-CM

## 2013-01-27 DIAGNOSIS — R079 Chest pain, unspecified: Secondary | ICD-10-CM

## 2013-01-27 DIAGNOSIS — Z8673 Personal history of transient ischemic attack (TIA), and cerebral infarction without residual deficits: Secondary | ICD-10-CM

## 2013-01-27 DIAGNOSIS — R7989 Other specified abnormal findings of blood chemistry: Secondary | ICD-10-CM | POA: Diagnosis present

## 2013-01-27 LAB — BASIC METABOLIC PANEL
BUN: 21 mg/dL (ref 6–23)
Calcium: 9.1 mg/dL (ref 8.4–10.5)
GFR calc Af Amer: 51 mL/min — ABNORMAL LOW (ref >90)
GFR calc non Af Amer: 44 mL/min — ABNORMAL LOW (ref >90)
Potassium: 3.7 mEq/L (ref 3.5–5.1)

## 2013-01-27 LAB — CBC WITH DIFFERENTIAL/PLATELET
Basophils Relative: 1 % (ref 0–1)
Eosinophils Absolute: 0.5 10*3/uL (ref 0.0–0.7)
Hemoglobin: 12.3 g/dL — ABNORMAL LOW (ref 13.0–17.0)
MCH: 31.1 pg (ref 26.0–34.0)
MCHC: 33.5 g/dL (ref 30.0–36.0)
Monocytes Relative: 5 % (ref 3–12)
Neutrophils Relative %: 44 % (ref 43–77)
Platelets: 94 10*3/uL — ABNORMAL LOW (ref 150–400)

## 2013-01-27 LAB — TROPONIN I
Troponin I: <0.3 ng/mL (ref <0.30)
Troponin I: <0.3 ng/mL (ref <0.30)
Troponin I: <0.3 ng/mL (ref <0.30)

## 2013-01-27 LAB — D-DIMER, QUANTITATIVE: D-Dimer, Quant: 0.61 ug/mL-FEU — ABNORMAL HIGH (ref 0.00–0.48)

## 2013-01-27 LAB — TSH: TSH: 1.546 u[IU]/mL (ref 0.350–4.500)

## 2013-01-27 MED ORDER — DOXAZOSIN MESYLATE 4 MG PO TABS
4.0000 mg | ORAL_TABLET | Freq: Every day | ORAL | Status: DC
  Administered 2013-01-27: 4 mg via ORAL
  Filled 2013-01-27 (×2): qty 1

## 2013-01-27 MED ORDER — STARCH (THICKENING) PO POWD
ORAL | Status: DC | PRN
  Filled 2013-01-27: qty 227

## 2013-01-27 MED ORDER — NITROGLYCERIN 2 % TD OINT
0.5000 [in_us] | TOPICAL_OINTMENT | Freq: Once | TRANSDERMAL | Status: AC
Start: 2013-01-27 — End: 2013-01-27
  Administered 2013-01-27: 0.5 [in_us] via TOPICAL
  Filled 2013-01-27: qty 1

## 2013-01-27 MED ORDER — TAMSULOSIN HCL 0.4 MG PO CAPS
0.4000 mg | ORAL_CAPSULE | Freq: Every day | ORAL | Status: DC
  Administered 2013-01-27 – 2013-01-28 (×2): 0.4 mg via ORAL
  Filled 2013-01-27 (×2): qty 1

## 2013-01-27 MED ORDER — TOPIRAMATE 25 MG PO TABS
25.0000 mg | ORAL_TABLET | Freq: Every day | ORAL | Status: DC
  Administered 2013-01-27: 25 mg via ORAL
  Filled 2013-01-27 (×2): qty 1

## 2013-01-27 MED ORDER — ASPIRIN 81 MG PO CHEW: 324.0000 mg | CHEWABLE_TABLET | Freq: Once | ORAL | Status: DC

## 2013-01-27 MED ORDER — TRAZODONE HCL 50 MG PO TABS
50.0000 mg | ORAL_TABLET | Freq: Every day | ORAL | Status: DC
  Administered 2013-01-27: 50 mg via ORAL
  Filled 2013-01-27 (×2): qty 1

## 2013-01-27 MED ORDER — LEVETIRACETAM 500 MG PO TABS
500.0000 mg | ORAL_TABLET | Freq: Two times a day (BID) | ORAL | Status: DC
  Administered 2013-01-27 – 2013-01-28 (×3): 500 mg via ORAL
  Filled 2013-01-27 (×5): qty 1

## 2013-01-27 MED ORDER — ASPIRIN EC 81 MG PO TBEC: 81.0000 mg | DELAYED_RELEASE_TABLET | Freq: Every day | ORAL | Status: DC

## 2013-01-27 MED ORDER — PANTOPRAZOLE SODIUM 20 MG PO TBEC
20.0000 mg | DELAYED_RELEASE_TABLET | Freq: Every day | ORAL | Status: DC
  Administered 2013-01-27: 20 mg via ORAL
  Filled 2013-01-27 (×2): qty 1

## 2013-01-27 MED ORDER — NITROGLYCERIN 0.4 MG SL SUBL: 0.4000 mg | SUBLINGUAL_TABLET | SUBLINGUAL | Status: DC | PRN

## 2013-01-27 MED ORDER — ONDANSETRON HCL 4 MG/2ML IJ SOLN: 4.0000 mg | Freq: Four times a day (QID) | INTRAMUSCULAR | Status: DC | PRN

## 2013-01-27 MED ORDER — CITALOPRAM HYDROBROMIDE 10 MG PO TABS
10.0000 mg | ORAL_TABLET | Freq: Every day | ORAL | Status: DC
  Administered 2013-01-27 – 2013-01-28 (×2): 10 mg via ORAL
  Filled 2013-01-27 (×2): qty 1

## 2013-01-27 MED ORDER — LISINOPRIL 20 MG PO TABS
20.0000 mg | ORAL_TABLET | Freq: Every day | ORAL | Status: DC
  Administered 2013-01-27: 20 mg via ORAL
  Filled 2013-01-27 (×2): qty 1

## 2013-01-27 MED ORDER — ASPIRIN EC 325 MG PO TBEC
325.0000 mg | DELAYED_RELEASE_TABLET | Freq: Every day | ORAL | Status: DC
  Administered 2013-01-27 – 2013-01-28 (×2): 325 mg via ORAL
  Filled 2013-01-27 (×2): qty 1

## 2013-01-27 MED ORDER — ACETAMINOPHEN 325 MG PO TABS
650.0000 mg | ORAL_TABLET | ORAL | Status: DC | PRN
  Administered 2013-01-27 – 2013-01-28 (×2): 650 mg via ORAL
  Filled 2013-01-27: qty 2

## 2013-01-27 MED ORDER — ASPIRIN 300 MG RE SUPP
300.0000 mg | Freq: Once | RECTAL | Status: AC
  Administered 2013-01-27: 300 mg via RECTAL
  Filled 2013-01-27: qty 1

## 2013-01-27 MED ORDER — METOPROLOL TARTRATE 25 MG PO TABS
25.0000 mg | ORAL_TABLET | Freq: Two times a day (BID) | ORAL | Status: DC
  Filled 2013-01-27 (×2): qty 1

## 2013-01-27 MED ORDER — METOPROLOL TARTRATE 12.5 MG HALF TABLET
12.5000 mg | ORAL_TABLET | Freq: Two times a day (BID) | ORAL | Status: DC
  Administered 2013-01-27: 12.5 mg via ORAL
  Filled 2013-01-27 (×3): qty 1

## 2013-01-27 MED ORDER — ALLOPURINOL 100 MG PO TABS
100.0000 mg | ORAL_TABLET | Freq: Two times a day (BID) | ORAL | Status: DC
  Administered 2013-01-27 – 2013-01-28 (×3): 100 mg via ORAL
  Filled 2013-01-27 (×4): qty 1

## 2013-01-27 MED ORDER — HEPARIN SODIUM (PORCINE) 5000 UNIT/ML IJ SOLN
5000.0000 [IU] | Freq: Three times a day (TID) | INTRAMUSCULAR | Status: DC
  Administered 2013-01-27 – 2013-01-28 (×4): 5000 [IU] via SUBCUTANEOUS
  Filled 2013-01-27 (×7): qty 1
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
## 2013-01-27 NOTE — Progress Notes (Signed)
EEG completed; results pending.    

## 2013-01-27 NOTE — Procedures (Signed)
EEG report.  Brief clinical history:57 year old mild patient lives at home. Known history of CVA and dense left hemiplegia as well as some dysphasia and dysphagia. Also apparent history of seizures. While talking on the telephone prior to admission the patient developed chest pain which was not typical. He also had an episode of near syncope noting he felt things go dark but he was able to hear other people talk. Patient was not noted to have any seizure-like activity during this time  Technique: this is a 17 channel routine scalp EEG performed at the bedside with bipolar and monopolar montages arranged in accordance to the international 10/20 system of electrode placement. One channel was dedicated to EKG recording.  The study was performed during wakefulness, drowsiness, and stage 2 sleep. No activating procedures performed.  Description:In the wakeful state, the best background consisted of a medium amplitude, posterior dominant, well sustained, symmetric and reactive 10 Hz rhythm. Drowsiness demonstrated dropout of the alpha rhythm. Stage 2 sleep showed symmetric and synchronous sleep spindles without intermixed epileptiform discharges. No focal or generalized epileptiform discharges noted.  No slowing seen.  EKG showed sinus rhythm.  Impression: this is a normal awake and asleep EEG. Please, be aware that a normal EEG does not exclude the possibility of epilepsy. If clinically indicated, a 24-48 hour ambulatory EEG could provide further information regarding patient's episode. Clinical correlation is advised.  Wyatt Portela, MD

## 2013-01-27 NOTE — Progress Notes (Signed)
  Echocardiogram 2D Echocardiogram has been performed.  Collin Diaz 01/27/2013, 11:23 AM

## 2013-01-27 NOTE — H&P (Signed)
[facility] History and Physical  Collin Diaz ZOX:[id number] DOB: 05-Jan-1956 DOA: 01/26/2013  Referring physician: ER physician. PCP: Marletta Lor, NP   Chief Complaint: Chest pain and almost passed out.  HPI: Collin Diaz is a 57 y.o. male with known history of CVA with recent hemiplegia and difficulty speaking, seizures, chronic kidney disease, hypertension last evening while talking on the phone suddenly developed chest pain which was retrosternal nonradiating pressure-like no associated shortness of breath diaphoresis. Along with the chest pain patient also had a brief episode of almost passing out sensation where patient felt things go dark but was able to hear others talk. Patient did not have any seizure-like activities or any tongue bite or incontinence of urine. Patient on any new focal deficits. The whole episode lasted for 15 minutes and was witnessed by patient's caregiver. As per the ER physician the caregiver explained that the patient had a staring spell. Presently patient is alert awake oriented to time place and person. Chest pain-free. Patient's cardiac markers have been negative. EKG shows new T wave inversions in lateral leads. CT head did not show anything acute.   Patient also was concerned about his home situation regarding safety.  Review of Systems: As presented in the history of presenting illness, rest negative.  Past Medical History  Diagnosis Date  . Hypertension   . Arthritis 04/04/2012  . Seizure 04/04/2012    POST CVA 12/2011  . RSD (reflex sympathetic dystrophy) 04/04/2012    LEFT HAND  . Dysphagia S/P CVA (cerebrovascular accident) 04/04/2012  . Aphasia 04/04/2012    HX OF POST cva  . Stroke 10/2010    LEFT mca cva ISCHEMIC  . 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 Chronic kidney disease   . Depression   . RSD (reflex sympathetic dystrophy) 2013    LEFT HAND   Past Surgical History  Procedure Laterality  Date  . No past surgeries     Social History:  reports that he quit smoking about 21 years ago. His smoking use included Cigarettes. He smoked 0.00 packs per day for 30 years. He has never used smokeless tobacco. He reports that he drinks alcohol. He reports that he does not use illicit drugs. Where does patient live home. Can patient participate in ADLs? No.  No Known Allergies  Family History: History reviewed. No pertinent family history.    Prior to Admission medications   Medication Sig Start Date End Date Taking? Authorizing Provider  allopurinol (ZYLOPRIM) 100 MG tablet Take 100 mg by mouth 2 (two) times daily.   Yes Historical Provider, MD  aspirin EC 81 MG tablet Take 81 mg by mouth daily.   Yes Historical Provider, MD  citalopram (CELEXA) 10 MG tablet Take 10 mg by mouth daily.   Yes Historical Provider, MD  doxazosin (CARDURA) 4 MG tablet Take 4 mg by mouth daily.   Yes Historical Provider, MD  levETIRAcetam (KEPPRA) 500 MG tablet Take 500 mg by mouth every 12 (twelve) hours.   Yes Historical Provider, MD  lisinopril (PRINIVIL,ZESTRIL) 20 MG tablet Take 20 mg by mouth daily.   Yes Historical Provider, MD  metoprolol tartrate (LOPRESSOR) 25 MG tablet Take 25 mg by mouth 2 (two) times daily.   Yes Historical Provider, MD  pantoprazole (PROTONIX) 20 MG tablet Take 20 mg by mouth at bedtime.   Yes Historical Provider, MD  tamsulosin (FLOMAX) 0.4 MG CAPS Take 0.4 mg by mouth daily.  Yes Historical Provider, MD  topiramate (TOPAMAX) 25 MG tablet Take 25 mg by mouth at bedtime.   Yes Historical Provider, MD  traZODone (DESYREL) 50 MG tablet Take 50 mg by mouth at bedtime.   Yes Historical Provider, MD    Physical Exam: Filed Vitals:   01/26/13 2335 01/26/13 2345 01/27/13 0100 01/27/13 0200  BP:  123/91 108/76 104/82  Pulse:   60 65  Temp: 98 F (36.7 C) 98 F (36.7 C)    TempSrc: Oral     Resp:  13 15 15   SpO2:  100% 99% 97%     General:   Well-developed and  nourished.  Eyes: Anicteric no pallor.  ENT: No discharge from ears eyes nose mouth.  Neck: No mass felt.  Cardiovascular: S1-S2 heard.  Respiratory: No rhonchi or crepitations.  Abdomen: Soft nontender bowel sounds present.  Skin: No rash.  Musculoskeletal: No edema.  Psychiatric: Appears normal.  Neurologic: Alert awake oriented to time place and person. Left-sided hemiplegia. No obvious facial asymmetry.  Labs on Admission:  Basic Metabolic Panel:  Recent Labs Lab 01/27/13 0001  NA 137  K 3.7  CL 103  CO2 24  GLUCOSE 128*  BUN 21  CREATININE 1.69*  CALCIUM 9.1   Liver Function Tests: No results found for this basename: AST, ALT, ALKPHOS, BILITOT, PROT, ALBUMIN,  in the last 168 hours No results found for this basename: LIPASE, AMYLASE,  in the last 168 hours No results found for this basename: AMMONIA,  in the last 168 hours CBC:  Recent Labs Lab 01/27/13 0001  WBC 3.6*  NEUTROABS 1.6*  HGB 12.3*  HCT 36.7*  MCV 92.7  PLT 94*   Cardiac Enzymes:  Recent Labs Lab 01/27/13 0001  TROPONINI <0.30    BNP (last 3 results)  Recent Labs  04/04/12 0633 10/06/12 2222  PROBNP 116.6 321.5*   CBG: No results found for this basename: GLUCAP,  in the last 168 hours  Radiological Exams on Admission: Dg Chest 2 View  01/27/2013   *RADIOLOGY REPORT*  Clinical Data: Chest pain.  CHEST - 2 VIEW  Comparison: Chest radiograph November 07, 2012.  Findings: Cardiac silhouette appears moderately enlarged, but partially obscured on the lateral radiograph the patient's left arm which was unable to be elevated.  No pleural effusions or focal consolidations.  Pulmonary vasculature is unremarkable.  Trachea projects midline and there is no pneumothorax.  Soft tissue planes and included osseous structures are not suspicious.  Degenerative change of the spine partially imaged.  IMPRESSION: Stable cardiomegaly, no acute pulmonary process.   Original Report Authenticated By:  Awilda Metro   Ct Head Wo Contrast  01/27/2013   CLINICAL DATA:  Altered mental status. Previous cerebral infarction. Stroke risk factors include hypertension, and kidney disease.  EXAM: CT HEAD WITHOUT CONTRAST  TECHNIQUE: Contiguous axial images were obtained from the base of the skull through the vertex without contrast.  COMPARISON:  MR 10/29/2012.  CT head 10/28/2012.  FINDINGS: Premature for age cerebral atrophy. Extensive chronic microvascular ischemic change affecting the periventricular and subcortical white matter, also involving the brainstem. Remote areas of cerebral infarction affecting the periventricular and subcortical deep white matter on the right and left. No visible acute infarct or hemorrhage. No mass lesion or hydrocephalus. No midline shift and no extra-axial fluid. Calvarium intact. Clear sinuses and mastoids. Similar appearance to most recent priors.  IMPRESSION: No acute intracranial abnormality.  Chronic changes as described.   Electronically Signed   By: Jonny Ruiz  Curnes M.D.   On: 01/27/2013 00:16    EKG: Independently reviewed. Normal sinus rhythm with incomplete right bundle branch block and T-wave inversions in the lateral leads.  Assessment/Plan Principal Problem:   Chest pain Active Problems:   Seizure   Near syncope   History of CVA (cerebrovascular accident)   HTN (hypertension)   1. Chest pain - cycle cardiac markers. Check 2-D echo. Check d-dimer. Aspirin. Presently chest pain-free. 2. Near-syncopal episode - given the history of seizures and previous strokes I have ordered MRI brain and EEG. Continue aspirin Keppra and Topamax. 3. Social worker consult - concerning home situation. Patient felt unsafe at home as per the reports. Social worker consult has been requested. 4. Pancytopenia - closely follow CBC. 5. Chronic kidney disease - creatinine appears to be at baseline. Closely follow intake output and metabolic panel. 6. Hypertension - continue present  medications.    Code Status: Full code.  Family Communication: None.  Disposition Plan: Admit for observation.    Chela Sutphen N. [facility] Pager [phone].  If 7PM-7AM, please contact night-coverage [email or web address] Password [email or web address] 01/27/2013, 2:18 AM

## 2013-01-27 NOTE — Progress Notes (Signed)
VASCULAR LAB PRELIMINARY  PRELIMINARY  PRELIMINARY  PRELIMINARY  Bilateral lower extremity venous duplex completed.    Preliminary report:  Bilateral:  No evidence of DVT, superficial thrombosis, or Baker's Cyst.   Delaney Schnick, RVS 01/27/2013, 11:30 AM

## 2013-01-27 NOTE — Progress Notes (Signed)
CSW (Clinical Child psychotherapist) received consult to speak with pt about unsafe living environment. CSW visited pt room but pt is down for procedure. CSW to complete assessment at a later time.  Munira Polson, LCSWA [phone]

## 2013-01-27 NOTE — ED Notes (Signed)
Pt's wife called to see if home aid was still with pt.

## 2013-01-27 NOTE — Progress Notes (Signed)
UR Completed Rani Sisney Graves-Bigelow, RN,BSN [phone]  

## 2013-01-27 NOTE — Progress Notes (Signed)
[facility] Progress Note [place] TEAM 1 - [facility] ICU Team   YOSHIHARU BRASSELL VHQ:[id number] DOB: 12-Jan-1956 DOA: 01/26/2013 PCP: Marletta Lor, NP  Brief narrative: 57 year old mild patient lives at home. Known history of CVA and dense left hemiplegia as well as some dysphasia and dysphagia. Also apparent history of seizures. While talking on the telephone prior to admission the patient developed chest pain which was not typical. He also had an episode of near syncope noting he felt things go dark but he was able to hear other people talk. Patient was not noted to have any seizure-like activity during this time. There was no evidence of oral trauma or incontinence of bowel or bladder. He also had no new focal neurological deficits which would suggest an acute CVA. The patient's caregiver witnessed all this - the episode lasted for about 15 minutes. Patient did have what was described as a staring spell during this time. By the time he presented to the emergency department he was awake and oriented and at his baseline and was chest pain free. EKG was without any new changes. CT of the head was without any acute changes.  Assessment/Plan:     Near syncope/?? Seizures -EEG pending -MRI cancelled -Also noted with bradycardia after admission so suspect near syncope could have been precipitated by this therefore beta blocker dose decreased    Chest pain -enzymes and EKG negative    KIDNEY DISEASE, CHRONIC, STAGE V -Scr stable    OSA (obstructive sleep apnea)    HTN (hypertension) -cont home meds    Positive D dimer -check venous duplex lower extremities -if positive then consider PE in differential as this could have precipitated near syncopal episode    Dysphagia S/P CVA (cerebrovascular accident)/ Left Hemiplegia/   History of CVA (cerebrovascular accident) -SLP eval- passed eval-resume D3 diet -Concerns about appropriateness of home environment and possible safety  issues -PT/OT evaluation-patient may benefit from skilled nursing facility at time of discharge   DVT prophylaxis: Subcutaneous heparin Code Status: Full Family Communication: Patient Disposition Plan/Expected LOS: Telemetry  Consultants: None  Procedures: 2-D echocardiogram pending  Antibiotics: None  HPI/Subjective: Patient awake and currently denying any symptoms including chest pain. He did admit to nurse as well as to attending MD that he has inconsistent caregiving at home. Nurse also reports patient informed her that caregivers at home do not get him out of bed or system with walking.  Objective: Blood pressure 131/93, pulse 60, temperature 97.7 F (36.5 C), temperature source Oral, resp. rate 18, height 5\' 11"  (1.803 m), weight 78.336 kg (172 lb 11.2 oz), SpO2 100.00%.  Intake/Output Summary (Last 24 hours) at 01/27/13 1341 Last data filed at 01/27/13 1300  Gross per 24 hour  Intake    120 ml  Output    350 ml  Net   -230 ml   Exam: Follow up exam completed. Patient admitted that 218 a.m. today  Scheduled Meds:  Scheduled Meds: . allopurinol  100 mg Oral BID  . aspirin EC  325 mg Oral Daily  . citalopram  10 mg Oral Daily  . doxazosin  4 mg Oral Daily  . heparin  5,000 Units Subcutaneous Q8H  . levETIRAcetam  500 mg Oral Q12H  . lisinopril  20 mg Oral Daily  . metoprolol tartrate  12.5 mg Oral BID  . pantoprazole  20 mg Oral QHS  . tamsulosin  0.4 mg Oral Daily  . topiramate  25 mg Oral QHS  . traZODone  50 mg Oral QHS   Data Reviewed: Basic Metabolic Panel:  Recent Labs Lab 01/27/13 0001  NA 137  K 3.7  CL 103  CO2 24  GLUCOSE 128*  BUN 21  CREATININE 1.69*  CALCIUM 9.1   CBC:  Recent Labs Lab 01/27/13 0001  WBC 3.6*  NEUTROABS 1.6*  HGB 12.3*  HCT 36.7*  MCV 92.7  PLT 94*   Cardiac Enzymes:  Recent Labs Lab 01/27/13 0001 01/27/13 0340 01/27/13 0830  TROPONINI <0.30 <0.30 <0.30   BNP (last 3 results)  Recent Labs   04/04/12 1610 10/06/12 2222  PROBNP 116.6 321.5*    Studies:  Recent x-ray studies have been reviewed in detail by the Attending Physician     Junious Silk, ANP [facility] Office  [phone] Pager [phone]  **If unable to reach the above provider after paging please contact the Flow Manager @ [phone]  On-Call/Text Page:      [email or web address]      password [email or web address]  If 7PM-7AM, please contact night-coverage [email or web address] Password [email or web address] 01/27/2013, 1:41 PM   LOS: 1 day   I have personally examined this patient and reviewed the entire database. I have reviewed the above note, made any necessary editorial changes, and agree with its content.  Lonia Blood, MD [facility]

## 2013-01-27 NOTE — Evaluation (Signed)
Clinical/Bedside Swallow Evaluation Patient Details  Name: Collin Diaz MRN: [id number] Date of Birth: October 11, 1955  Today's Date: 01/27/2013 Time: 1013-1028 SLP Time Calculation (min): 15 min  Past Medical History:  Past Medical History  Diagnosis Date  . Hypertension   . Arthritis 04/04/2012  . Seizure 04/04/2012    POST CVA 12/2011  . RSD (reflex sympathetic dystrophy) 04/04/2012    LEFT HAND  . Dysphagia S/P CVA (cerebrovascular accident) 04/04/2012  . Aphasia 04/04/2012    HX OF POST cva  . Stroke 10/2010    LEFT mca cva ISCHEMIC  . 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 Chronic kidney disease   . Depression   . RSD (reflex sympathetic dystrophy) 2013    LEFT HAND   Past Surgical History:  Past Surgical History  Procedure Laterality Date  . No past surgeries     HPI:  57 y.o. male with known history of CVA with recent hemiplegia and difficulty speaking, seizures, chronic kidney disease, hypertension last evening while talking on the phone suddenly developed chest pain which was retrosternal nonradiating pressure-like no associated shortness of breath diaphoresis. Along with the chest pain patient also had a brief episode of almost passing out sensation where patient felt things go dark but was able to hear others talk. The whole episode lasted for 15 minutes and was witnessed by patient's caregiver.  CXR 10/13 Stable cardiomegaly, no acute pulmonary process.  Following CVA's, pt with resulting dysphgia, has been seen by SLP service multiple time sin the past, recommended to consume Dys3/nectar at last MBS in 1/14.  September 24, 2012 is most recent MBS with silent aspiration of thin without success of chin tuck.  Limited bolus was effective with teaspoon sips thin or thickening liquid.  Dys 3 diet texture and nectar thick liquids were recommended.   Assessment / Plan / Recommendation Clinical Impression  Pt. who is well known to Speech Department,  seen for swallow assessment without family present.  He reports consuming nectar thick liquids at home without difficulty.  No s/s symptoms penetration/aspiration with nectar juice and cracker.  Oral manipulation slightly delayed with reports of chopped meats at home.  No pocketing exhibited. Recommend Dys 3 diet texture and nectar thick liquids.  Pt.'s CXR is negative for acute process, head CT is negative and no concern for new CVA ( MD cancelling MRI).  No ST needed during this hospitalization, however please page if needed.    Aspiration Risk  Mild (mild-mod)    Diet Recommendation Dysphagia 3 (Mechanical Soft);Nectar-thick liquid   Liquid Administration via: Cup;No straw Medication Administration: Whole meds with puree Supervision: Patient able to self feed;Intermittent supervision to cue for compensatory strategies Compensations: Slow rate;Small sips/bites Postural Changes and/or Swallow Maneuvers: Seated upright 90 degrees    Other  Recommendations Oral Care Recommendations: Oral care BID Other Recommendations: Order thickener from pharmacy   Follow Up Recommendations  None    Frequency and Duration        Pertinent Vitals/Pain none         Swallow Study           Oral/Motor/Sensory Function Overall Oral Motor/Sensory Function: Appears within functional limits for tasks assessed   Ice Chips Ice chips: Not tested   Thin Liquid Thin Liquid: Not tested (on nectar thick at home)    Nectar Thick Nectar Thick Liquid: Within functional limits Presentation: Cup   Honey Thick Honey Thick Liquid: Not tested  Puree Puree: Not tested   Solid    Functional Assessment Tool Used: clinical judgement Functional Limitations: Swallowing Swallow Current Status (H0865): At least 1 percent but less than 20 percent impaired, limited or restricted Swallow Goal Status [phone]): At least 1 percent but less than 20 percent impaired, limited or restricted Swallow Discharge Status [phone]):  At least 1 percent but less than 20 percent impaired, limited or restricted  Solid: Impaired Presentation: Self Fed Oral Phase Impairments: Reduced lingual movement/coordination Oral Phase Functional Implications:  (minimally delayed)       Darrow Bussing.Ed [employer] [phone] 01/27/2013
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
## 2013-01-28 DIAGNOSIS — M129 Arthropathy, unspecified: Secondary | ICD-10-CM

## 2013-01-28 DIAGNOSIS — M109 Gout, unspecified: Secondary | ICD-10-CM

## 2013-01-28 DIAGNOSIS — Z8673 Personal history of transient ischemic attack (TIA), and cerebral infarction without residual deficits: Secondary | ICD-10-CM

## 2013-01-28 DIAGNOSIS — G8190 Hemiplegia, unspecified affecting unspecified side: Secondary | ICD-10-CM

## 2013-01-28 DIAGNOSIS — I69991 Dysphagia following unspecified cerebrovascular disease: Secondary | ICD-10-CM

## 2013-01-28 LAB — BASIC METABOLIC PANEL
BUN: 25 mg/dL — ABNORMAL HIGH (ref 6–23)
Calcium: 9 mg/dL (ref 8.4–10.5)
Chloride: 110 mEq/L (ref 96–112)
Creatinine, Ser: 1.83 mg/dL — ABNORMAL HIGH (ref 0.50–1.35)
GFR calc Af Amer: 46 mL/min — ABNORMAL LOW (ref >90)
GFR calc non Af Amer: 40 mL/min — ABNORMAL LOW (ref >90)

## 2013-01-28 MED ORDER — ASPIRIN 325 MG PO TBEC: 325.0000 mg | DELAYED_RELEASE_TABLET | Freq: Every day | ORAL | Status: AC

## 2013-01-28 MED ORDER — STARCH (THICKENING) PO POWD: ORAL | Status: AC

## 2013-01-28 MED ORDER — NITROGLYCERIN 0.3 MG SL SUBL: 0.3000 mg | SUBLINGUAL_TABLET | SUBLINGUAL | Status: DC | PRN

## 2013-01-28 MED ORDER — DOXAZOSIN MESYLATE 2 MG PO TABS: 2.0000 mg | ORAL_TABLET | Freq: Every day | ORAL | Status: DC

## 2013-01-28 MED ORDER — ASPIRIN 325 MG PO TBEC: 325.0000 mg | DELAYED_RELEASE_TABLET | Freq: Every day | ORAL | Status: DC

## 2013-01-28 MED ORDER — AMLODIPINE BESYLATE 10 MG PO TABS: 10.0000 mg | ORAL_TABLET | Freq: Every day | ORAL | Status: DC

## 2013-01-28 MED ORDER — NITROGLYCERIN 0.3 MG SL SUBL
0.3000 mg | SUBLINGUAL_TABLET | SUBLINGUAL | Status: DC | PRN
  Filled 2013-01-28: qty 100

## 2013-01-28 MED ORDER — NITROGLYCERIN 0.3 MG SL SUBL: 0.3000 mg | SUBLINGUAL_TABLET | SUBLINGUAL | Status: AC | PRN

## 2013-01-28 MED ORDER — AMLODIPINE BESYLATE 10 MG PO TABS: 10.0000 mg | ORAL_TABLET | Freq: Every day | ORAL | Status: AC

## 2013-01-28 MED ORDER — AMLODIPINE BESYLATE 10 MG PO TABS
10.0000 mg | ORAL_TABLET | Freq: Every day | ORAL | Status: DC
  Administered 2013-01-28: 10 mg via ORAL
  Filled 2013-01-28: qty 1

## 2013-01-28 MED ORDER — STARCH (THICKENING) PO POWD: ORAL | Status: DC

## 2013-01-28 MED ORDER — AMLODIPINE BESYLATE 5 MG PO TABS
5.0000 mg | ORAL_TABLET | Freq: Every day | ORAL | Status: DC
  Filled 2013-01-28: qty 1

## 2013-01-28 NOTE — Evaluation (Signed)
Physical Therapy Evaluation Patient Details Name: Collin Diaz MRN: [id number] DOB: 09-24-1955 Today's Date: 01/28/2013 Time: 3086-5784 PT Time Calculation (min): 21 min  PT Assessment / Plan / Recommendation History of Present Illness  57 y.o. 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  Clinical Impression  Pt is generally deconditioned and reports he does not get OOB at home because his wife and aid will not help him get OOB at home.  I do not know how accurate this is and have not talked with the wife.  He reports after rehab after the stroke initially he was ambulatory.  He has had one fall and maybe this is why family is unwilling/able to get him up OOB.  He would benefit from HHPT to help him regain some strength so that he can get up OOB safely and with greater ease with family/aide at home.   PT to follow acutely for deficits listed below.     PT Assessment  Patient needs continued PT services    Follow Up Recommendations  Home health PT;Supervision/Assistance - 24 hour (Due to SNF not an option under observation status)    Does the patient have the potential to tolerate intense rehabilitation     Yes  Barriers to Discharge   None      Equipment Recommendations  None recommended by PT    Recommendations for Other Services   None  Frequency Min 3X/week    Precautions / Restrictions Precautions Precautions: Fall Precaution Comments: h/o fall at home, L hemipelegia Restrictions Weight Bearing Restrictions: No Other Position/Activity Restrictions: Pt states "I don't walk" uses manual w/c   Pertinent Vitals/Pain See vitals flow sheet.       Mobility  Bed Mobility Bed Mobility: Supine to Sit;Sitting - Scoot to Edge of Bed Rolling Right: 3: Mod assist;With rail Supine to Sit: 3: Mod assist;HOB elevated;With rails Sitting - Scoot  to Edge of Bed: 3: Mod assist;With rail Scooting to [facility]: 4: Min assist;With rail Details for Bed Mobility Assistance: mod assist to support trunk and to move left leg to EOB.  Cues for sequencing, hand placement and technique.   Transfers Transfers: Stand to Sit;Sit to Stand;Squat Pivot Transfers Sit to Stand: 2: Max assist;From elevated surface;With upper extremity assist;With armrests;From bed Stand to Sit: 2: Max assist;With upper extremity assist;With armrests;To chair/3-in-1 Squat Pivot Transfers: 2: Max assist;From elevated surface;With upper extremity assistance;With armrests Details for Transfer Assistance: max assist to support trunk and stabilize left knee for stand pivot to recliner chair on pt's right (strong) side.  Cues needed for safe hand placement and technique.  Pt able to use armrests for stability during transfer.   Ambulation/Gait Ambulation/Gait Assistance: Not tested (comment) (pt has been non-ambulatory for some time now.  )        PT Diagnosis: Difficulty walking;Abnormality of gait;Generalized weakness;Hemiplegia non-dominant side  PT Problem List: Decreased strength;Decreased activity tolerance;Decreased balance;Decreased mobility;Decreased coordination;Decreased knowledge of use of DME;Impaired sensation PT Treatment Interventions: DME instruction;Gait training;Functional mobility training;Therapeutic activities;Balance training;Therapeutic exercise;Neuromuscular re-education;Patient/family education;Wheelchair mobility training     PT Goals(Current goals can be found in the care plan section) Acute Rehab PT Goals Patient Stated Goal: pt wants to walk again, and he wants to get OOB daily PT Goal Formulation: With patient Time For Goal Achievement: 02/11/13 Potential to Achieve Goals: Good  Visit Information  Last PT Received  On: 01/28/13 Assistance Needed: +2 (for attempts at gait) History of Present Illness: 57 y.o. male with hx of prior CVA resulting in  left hemiplegia, HTN, Seizure, several CVAs including hemorrhagic CVA, CKD stage V, presents to the ER with a syncope after having diarrhea.       Prior Functioning  Home Living Family/patient expects to be discharged to:: Private residence Living Arrangements: Spouse/significant other Available Help at Discharge: Family;Personal care attendant;Available 24 hours/day Type of Home: Apartment Home Access: Ramped entrance (at the back per pt report) Entrance Stairs-Number of Steps: 3 + 2 + 1 steps to enter Entrance Stairs-Rails: Left Home Layout: One level Home Equipment: Walker - 2 wheels;Bedside commode;Tub bench;Wheelchair - manual;Hospital bed Prior Function Level of Independence: Needs assistance Gait / Transfers Assistance Needed: using wheelchair and dependent for transfers ADL's / Homemaking Assistance Needed: completely dependent Communication / Swallowing Assistance Needed: see sign in room, nectar thick, no straws.   Comments: Per pt after his stroke he did walk with a walker (likely platform), but after he stopped having therapy and he fell his wife would not get him OOB.  His aid will not get him OOB either per pt report.   Communication Communication: No difficulties Dominant Hand: Right    Cognition  Cognition Arousal/Alertness: Awake/alert Behavior During Therapy: WFL for tasks assessed/performed Overall Cognitive Status: Within Functional Limits for tasks assessed    Extremity/Trunk Assessment Upper Extremity Assessment Upper Extremity Assessment: Defer to OT evaluation LUE Deficits / Details: Pt w/ previous CVA, hemiplegic. Tone throughout L UE Lower Extremity Assessment Lower Extremity Assessment: Generalized weakness;LLE deficits/detail;RLE deficits/detail RLE Deficits / Details: right leg is generally weak from disuse.   LLE Deficits / Details: Left leg with only trace noted at quad, o/5 ankle, and hip NT.   LLE Sensation: decreased light touch LLE  Coordination: decreased fine motor;decreased gross motor (due to weakness) Cervical / Trunk Assessment Cervical / Trunk Assessment: Normal   Balance Balance Balance Assessed: Yes Static Sitting Balance Static Sitting - Balance Support: Right upper extremity supported;Feet supported Static Sitting - Level of Assistance: 4: Min assist;5: Stand by assistance Static Sitting - Comment/# of Minutes: started at min assist, but once positioned well and sat for >3 mins pt was able to right himself.   Static Standing Balance Static Standing - Balance Support: Right upper extremity supported Static Standing - Level of Assistance: 2: Max assist Static Standing - Comment/# of Minutes: max assist to support trunk and block left knee from buckling.   Dynamic Standing Balance Dynamic Standing - Balance Support: Right upper extremity supported Dynamic Standing - Level of Assistance: 2: Max assist Dynamic Standing - Comments: max assist to support trunk and block left knee from buckling.    End of Session PT - End of Session Equipment Utilized During Treatment: Gait belt Activity Tolerance: Patient limited by fatigue Patient left: in chair;with call bell/phone within reach Nurse Communication: Mobility status (to Hydrologist.  )  GP Functional Assessment Tool Used: assist level Functional Limitation: Mobility: Walking and moving around Mobility: Walking and Moving Around Current Status (W0981): At least 60 percent but less than 80 percent impaired, limited or restricted Mobility: Walking and Moving Around Goal Status [phone]): At least 20 percent but less than 40 percent impaired, limited or restricted   Yvonne Stopher B. Anthem Frazer, PT, DPT [phone]   01/28/2013, 10:30 AM

## 2013-01-28 NOTE — Progress Notes (Signed)
CSW (Clinical Child psychotherapist) informed that pt is ready for dc and will need non emergent ambulance transport. CSW called pt wife and confirmed address and also that she will be at the home at pt arrival. CSW arranged for PTAR. Pt nurse aware. CSW signing off.   Meganne Rita, LCSWA [phone]
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
## 2013-01-28 NOTE — Evaluation (Signed)
Occupational Therapy Evaluation Patient Details Name: Collin Diaz MRN: [id number] DOB: 07-31-1955 Today's Date: 01/28/2013 Time: 2595-6387 OT Time Calculation (min): 29 min  OT Assessment / Plan / Recommendation History of present illness 57 y.o. 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   Clinical Impression   Pt currently demonstrating deficits in ADL's & functional transfers ?baseline level, however no family present. Pt should benefit from acute OT to address functional transfers and ADL as per OT problem list below. Will follow acutely.    OT Assessment  Patient needs continued OT Services    Follow Up Recommendations  SNF    Barriers to Discharge      Equipment Recommendations  None recommended by OT    Recommendations for Other Services    Frequency  Min 2X/week    Precautions / Restrictions Precautions Precautions: Fall Restrictions Weight Bearing Restrictions: No Other Position/Activity Restrictions: Pt states "I don't walk" uses manual w/c   Pertinent Vitals/Pain Pt c/o 10/10 headache, RN made aware, repositioned in bed.    ADL  Grooming: Performed;Wash/dry hands;Wash/dry face;Teeth care;Minimal assistance Where Assessed - Grooming: Supine, head of bed up Upper Body Bathing: Performed;Chest;Left arm;Abdomen;Minimal assistance Where Assessed - Upper Body Bathing: Supine, head of bed up Lower Body Bathing: Performed;Moderate assistance Where Assessed - Lower Body Bathing: Supine, head of bed up Upper Body Dressing: Performed;Minimal assistance Where Assessed - Upper Body Dressing: Supine, head of bed up Lower Body Dressing: Simulated;+1 Total assistance Where Assessed - Lower Body Dressing: Rolling right and/or left;Supine, head of bed flat Toilet Transfer: +1 Total assistance;Simulated (Pt reports uses urinal ) Toilet  Transfer: Patient Percentage: 50% Toilet Transfer Method: Charity fundraiser: Drop arm bedside commode Tub/Shower Transfer Method: Not assessed Transfers/Ambulation Related to ADLs: Pt reports that he doesn't walk, uses manual w/c at home/in apartment. Uses urinal & is otherwise dependent for transfers per his reports. ADL Comments: Pt performed grooming, UB/LB ADL's bed level today with min A (UB & grooming) to total assist (LB). He reports that his wife assist 24/7 and that he wishes to go to rehab if appropriate. ?if pt is currently at baseline level of care, no family present during assessment.    OT Diagnosis: Hemiplegia non-dominant side;Generalized weakness  OT Problem List: Cardiopulmonary status limiting activity;Pain;Decreased activity tolerance OT Treatment Interventions: Self-care/ADL training;Neuromuscular education;DME and/or AE instruction;Patient/family education;Therapeutic activities   OT Goals(Current goals can be found in the care plan section) Acute Rehab OT Goals Patient Stated Goal: "I want ot go to Rehab, but I don't walk" Time For Goal Achievement: 01/28/13 Potential to Achieve Goals: Fair  Visit Information  Last OT Received On: 01/28/13 History of Present Illness: 57 y.o. 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       Prior Functioning     Home Living Family/patient expects to be discharged to:: Private residence Living Arrangements: Spouse/significant other Available Help at Discharge: Family;Available 24 hours/day Type of Home: Apartment Home Access: Stairs to enter Entrance Stairs-Number of Steps: 3 + 2 + 1 steps to enter Entrance Stairs-Rails: Left Home Layout: One level Home Equipment: Walker - 2 wheels;Bedside commode;Tub bench;Wheelchair - manual (Uses w/c primarily, doesn't use  RW) Prior Function Level of Independence: Needs assistance  Gait / Transfers Assistance Needed: using wheelchair and dependent for transfers ADL's / Homemaking Assistance Needed: completely dependent Comments: Ptreports that he feeds himself, wife prepares everything Communication Communication: No difficulties Dominant Hand: Right    Vision/Perception Vision - History Baseline Vision: Wears glasses only for reading Patient Visual Report: No change from baseline   Cognition  Cognition Arousal/Alertness: Awake/alert Overall Cognitive Status: Within Functional Limits for tasks assessed    Extremity/Trunk Assessment Upper Extremity Assessment Upper Extremity Assessment: LUE deficits/detail LUE Deficits / Details: Pt w/ previous CVA, hemiplegic. Tone throughout L UE Lower Extremity Assessment Lower Extremity Assessment: Defer to PT evaluation    Mobility Bed Mobility Bed Mobility: Rolling Right;Scooting to [facility] Rolling Right: 3: Mod assist Scooting to HOB: 4: Min assist;With rail Transfers Transfers: Not assessed            End of Session OT - End of Session Activity Tolerance: Patient tolerated treatment well Patient left: in bed;with call bell/phone within reach Nurse Communication: Other (comment);Patient requests pain meds (?pt at baseline level of care)  GO Functional Assessment Tool Used: Clinical judgement Functional Limitation: Self care Self Care Current Status (Z6109): At least 60 percent but less than 80 percent impaired, limited or restricted Self Care Goal Status (U0454): At least 40 percent but less than 60 percent impaired, limited or restricted   Alm Bustard 01/28/2013, 9:15 AM

## 2013-01-28 NOTE — Progress Notes (Signed)
DC orders received.  Patient stable with no S/S of distress.  Medication and discharge information reviewed with patient and patient's wife.  Patient DC home via ambulance. Collin Diaz  

## 2013-01-28 NOTE — Discharge Summary (Signed)
Physician Discharge Summary  Patient ID: Collin Diaz MRN: [id number] DOB/AGE: Feb 16, 1956 57 y.o.  Admit date: 01/26/2013 Discharge date: 01/28/2013  Primary Care Physician:  Marletta Lor, NP  Primary Discharge Diagnoses:   Near-syncope or possibly seizure   . Chest pain . Near syncope . HTN (hypertension) . (Resolved) Syncope . KIDNEY DISEASE, CHRONIC, STAGE V . OSA (obstructive sleep apnea) . Hemiplegia, unspecified, affecting dominant side . Positive D dimer  Consults: None  Recommendations for Outpatient Follow-up:  1. please note that all his previous antihypertensives have been discontinued due to bradycardia (beta blocker), renal insufficiency (lisinopril), near syncope/orthostasis (Cardura). He is started on Norvasc 10mg  daily only at this time. Home health RN is arranged for medication management. Patient appears to be somewhat confused about his medications.    Allergies:  No Known Allergies   Discharge Medications:   Medication List    STOP taking these medications       doxazosin 4 MG tablet  Commonly known as:  CARDURA     lisinopril 20 MG tablet  Commonly known as:  PRINIVIL,ZESTRIL     metoprolol tartrate 25 MG tablet  Commonly known as:  LOPRESSOR      TAKE these medications       allopurinol 100 MG tablet  Commonly known as:  ZYLOPRIM  Take 100 mg by mouth 2 (two) times daily.     amLODipine 10 MG tablet  Commonly known as:  NORVASC  Take 1 tablet (10 mg total) by mouth daily.     aspirin 325 MG EC tablet  Take 1 tablet (325 mg total) by mouth daily.     citalopram 10 MG tablet  Commonly known as:  CELEXA  Take 10 mg by mouth daily.     food thickener Powd  Commonly known as:  THICK IT  FOR NECTAR THICK LIQUID     levETIRAcetam 500 MG tablet  Commonly known as:  KEPPRA  Take 500 mg by mouth every 12 (twelve) hours.     nitroGLYCERIN 0.3 MG SL tablet  Commonly known as:  NITROSTAT  Place 1 tablet (0.3 mg total) under the  tongue every 5 (five) minutes as needed for chest pain.     pantoprazole 20 MG tablet  Commonly known as:  PROTONIX  Take 20 mg by mouth at bedtime.     tamsulosin 0.4 MG Caps capsule  Commonly known as:  FLOMAX  Take 0.4 mg by mouth daily.     topiramate 25 MG tablet  Commonly known as:  TOPAMAX  Take 25 mg by mouth at bedtime.     traZODone 50 MG tablet  Commonly known as:  DESYREL  Take 50 mg by mouth at bedtime.         Brief H and P: For complete details please refer to admission H and P, but in brief, patient is a 57 year old male with known history of CVA with left-sided hemiplegia, history of seizures, ? Chronic kidney disease (baseline creatinine appears to be 1.8) who had presented with atypical chest pain and almost passed out. Patient had reported that while he was talking on the phone he suddenly developed chest pain, retrosternal, nonradiating with no associated shortness of breath or diaphoresis. He felt almost passing out sensation, where "things go dark" but was able to heal this to. He denied having any seizure-like activities or tongue bite or incontinence of urine he had no new focal neurological deficits. Patient's episode lasted for 15 minutes and was witnessed  by patient's caregiver. He did not have any chest pain the ER. Cardiac markers were negative. EKG showed new T wave inversion in the lateral. Patient was admitted for further workup.  Hospital Course:  Patient was admitted with atypical chest pain and near syncopal episode with a questionable staring spell her to the caregiver. Patient was initially admitted to the step down unit but transfer to the monitored floor on 01/27/13. Near-syncope with question of seizures: Patient had no new focal neurological deficits. CT head showed no acute intracranial abnormality, had extensive chronic microvascular ischemia affecting the periventricular and subcortical white matter and brainstem. MRI was not obtained. EEG was  normal. He was ruled out for acute ACS with a 4 negative cardiac enzymes. Patient underwent 2-D echo which showed mild LVH, EF 60-65%, normal left ventricular discussed with dysfunction, no wall motion abnormalities were identified on the echo.   Patient was noted to have borderline BP, based on the previous chart review, admission in 10/2012, he had a similar presentation and was recommended to stop hydralazine, lisinopril, Lopressor. Patient at that time was only DC'd on Cardura 8 mg daily.  During this hospitalization, patient was noted to have bradycardia with heart rate in 50s to 60s, and it is possible that near-syncope could have been precipitated by this hence beta blocker stopped.  Patient was recommended to stop his lisinopril during the previous admissions however he appears to be confused about all his medications and continued to take it. He does not appear to have any proteinuria on the UA samples in the past or diabetes, for now I have discontinued his lisinopril again and he should have BMET check in one week with his primary care followup. I have also discontinued the Cardura due to the side effect of orthostasis and his near-syncope presentation Home health PT, RN was arranged. The patient later told the case management, social worker and myself that he has no issues with caregiving at home.  Atypical chest pain: Resolved, cardiac enzymes remained negative, he was ruled out for acute ACS. Patient underwent 2-D echo which showed mild LVH, EF 60-65%, normal left ventricular discussed with dysfunction, no wall motion abnormalities were identified on the echo.    Dysphagia S/P CVA (cerebrovascular accident)/ Left Hemiplegia/ History of CVA (cerebrovascular accident)  -SLP eval- passed eval-patient is tolerating dysphagia 3 diet with nectar thick liquids   Day of Discharge BP 115/78  Pulse 69  Temp(Src) 98.9 F (37.2 C) (Oral)  Resp 17  Ht 5\' 11"  (1.803 m)  Wt 78.336 kg (172 lb 11.2  oz)  BMI 24.1 kg/m2  SpO2 99%  Physical Exam: General: Alert and awake oriented x3 not in any acute distress. CVS: S1-S2 clear no murmur rubs or gallops Chest: clear to auscultation bilaterally, no wheezing rales or rhonchi Abdomen: soft nontender, nondistended, normal bowel sounds Extremities: no cyanosis, clubbing or edema noted bilaterally Neuro: Left-sided hemiplegia, no facial drooping   The results of significant diagnostics from this hospitalization (including imaging, microbiology, ancillary and laboratory) are listed below for reference.    LAB RESULTS: Basic Metabolic Panel:  Recent Labs Lab 01/27/13 0001 01/28/13 0354  NA 137 145  K 3.7 4.1  CL 103 110  CO2 24 25  GLUCOSE 128* 94  BUN 21 25*  CREATININE 1.69* 1.83*  CALCIUM 9.1 9.0   Liver Function Tests: No results found for this basename: AST, ALT, ALKPHOS, BILITOT, PROT, ALBUMIN,  in the last 168 hours No results found for this basename: LIPASE,  AMYLASE,  in the last 168 hours No results found for this basename: AMMONIA,  in the last 168 hours CBC:  Recent Labs Lab 01/27/13 0001  WBC 3.6*  NEUTROABS 1.6*  HGB 12.3*  HCT 36.7*  MCV 92.7  PLT 94*   Cardiac Enzymes:  Recent Labs Lab 01/27/13 0830 01/27/13 1558  TROPONINI <0.30 <0.30   BNP: No components found with this basename: POCBNP,  CBG: No results found for this basename: GLUCAP,  in the last 168 hours  Significant Diagnostic Studies:  Dg Chest 2 View  01/27/2013   *RADIOLOGY REPORT*  Clinical Data: Chest pain.  CHEST - 2 VIEW  Comparison: Chest radiograph November 07, 2012.  Findings: Cardiac silhouette appears moderately enlarged, but partially obscured on the lateral radiograph the patient's left arm which was unable to be elevated.  No pleural effusions or focal consolidations.  Pulmonary vasculature is unremarkable.  Trachea projects midline and there is no pneumothorax.  Soft tissue planes and included osseous structures are not  suspicious.  Degenerative change of the spine partially imaged.  IMPRESSION: Stable cardiomegaly, no acute pulmonary process.   Original Report Authenticated By: Awilda Metro   Ct Head Wo Contrast  01/27/2013   CLINICAL DATA:  Altered mental status. Previous cerebral infarction. Stroke risk factors include hypertension, and kidney disease.  EXAM: CT HEAD WITHOUT CONTRAST  TECHNIQUE: Contiguous axial images were obtained from the base of the skull through the vertex without contrast.  COMPARISON:  MR 10/29/2012.  CT head 10/28/2012.  FINDINGS: Premature for age cerebral atrophy. Extensive chronic microvascular ischemic change affecting the periventricular and subcortical white matter, also involving the brainstem. Remote areas of cerebral infarction affecting the periventricular and subcortical deep white matter on the right and left. No visible acute infarct or hemorrhage. No mass lesion or hydrocephalus. No midline shift and no extra-axial fluid. Calvarium intact. Clear sinuses and mastoids. Similar appearance to most recent priors.  IMPRESSION: No acute intracranial abnormality.  Chronic changes as described.   Electronically Signed   By: Davonna Belling M.D.   On: 01/27/2013 00:16    2D ECHO: Study Conclusions  Left ventricle: The cavity size was normal. Wall thickness was increased in a pattern of mild LVH. Systolic function was normal. The estimated ejection fraction was in the range of 60% to 65%. Left ventricular diastolic function parameters were normal.     Disposition and Follow-up: Discharge Orders   Future Orders Complete By Expires   Discharge instructions  As directed    Comments:     Discharge Diet: Mechanical soft with Nectar thick liquids   Increase activity slowly  As directed        DISPOSITION: Home  DIET: Mechanical soft with nectar thick liquids  ACTIVITY: As tolerated  DISCHARGE FOLLOW-UP Follow-up Information   Follow up with Marletta Lor, NP. Schedule an  appointment as soon as possible for a visit in 1 week.   Specialty:  Nurse Practitioner   Contact information:   [facility] [address] [place] [address] [phone]       Time spent on Discharge: 40 mins  Signed:   RAI,RIPUDEEP M.D. [facility] 01/28/2013, 12:27 PM Pager: [phone]

## 2013-01-28 NOTE — Progress Notes (Signed)
Physical Therapy Treatment Patient Details Name: Collin Diaz MRN: [id number] DOB: Sep 16, 1955 Today's Date: 01/28/2013 Time: 4098-1191 PT Time Calculation (min): 16 min  PT Assessment / Plan / Recommendation  History of Present Illness 57 y.o. 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   PT Comments   Pt had a much harder time getting back to bed from lower recliner chair even going to his right side.  Per RN/tech pt's wife is coming to take him home and he is being discharged.    Follow Up Recommendations  Home health PT;Supervision/Assistance - 24 hour     Does the patient have the potential to tolerate intense rehabilitation    NA  Barriers to Discharge   None      Equipment Recommendations  None recommended by PT    Recommendations for Other Services   None  Frequency Min 3X/week   Progress towards PT Goals Progress towards PT goals: Progressing toward goals  Plan Current plan remains appropriate    Precautions / Restrictions Precautions Precautions: Fall Precaution Comments: h/o fall at home, L hemipelegia Restrictions Weight Bearing Restrictions: No Other Position/Activity Restrictions: Pt states "I don't walk" uses manual w/c   Pertinent Vitals/Pain See vitals flow sheet.    Mobility  Bed Mobility Bed Mobility: Supine to Sit;Sitting - Scoot to Edge of Bed Rolling Right: 3: Mod assist;With rail Supine to Sit: 3: Mod assist;HOB elevated;With rails Sitting - Scoot to Edge of Bed: 3: Mod assist;With rail Sit to Supine: 3: Mod assist;With rail;HOB flat Scooting to HOB: 3: Mod assist;With rail;Other (comment) (HOB in trendelenberg) Details for Bed Mobility Assistance: mod assist to support legs to get to supine and to help adjust trunk to midline in bed.  With cues for 1/2 bridge and mod support at trunk pt able to help scoot to  [facility].   Transfers Transfers: Sit to Stand;Stand to [employer] Transfers Sit to Stand: 2: Max assist;With upper extremity assist;With armrests;From chair/3-in-1 Stand to Sit: 2: Max assist;With upper extremity assist;With armrests;To bed Stand Pivot Transfers: 2: Max assist;With armrests Squat Pivot Transfers: 2: Max assist;From elevated surface;With upper extremity assistance;With armrests Details for Transfer Assistance: max assist to support trunk to get to standing and to block left knee to keep from buckling.   Ambulation/Gait Ambulation/Gait Assistance: Not tested (comment) (pt not strong enough at this point in time.  )        PT Diagnosis: Difficulty walking;Abnormality of gait;Generalized weakness;Hemiplegia non-dominant side  PT Problem List: Decreased strength;Decreased activity tolerance;Decreased balance;Decreased mobility;Decreased coordination;Decreased knowledge of use of DME;Impaired sensation PT Treatment Interventions: DME instruction;Gait training;Functional mobility training;Therapeutic activities;Balance training;Therapeutic exercise;Neuromuscular re-education;Patient/family education;Wheelchair mobility training   PT Goals (current goals can now be found in the care plan section) Acute Rehab PT Goals Patient Stated Goal: pt wants to walk again, and he wants to get OOB daily PT Goal Formulation: With patient Time For Goal Achievement: 02/11/13 Potential to Achieve Goals: Good Additional Goals Additional Goal #1: Pt will be able to propel WC (hemi technique) 150' with supervision.    Visit Information  Last PT Received On: 01/28/13 Assistance Needed: +2 (for safety with gait) History of Present Illness: 57 y.o. 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  Subjective Data  Subjective: Pt ready to get back into the bed to eat  lunch and take a nap before wife comes to take him home Patient Stated Goal: pt wants to walk again, and he wants to get OOB daily   Cognition  Cognition Arousal/Alertness: Awake/alert Behavior During Therapy: WFL for tasks assessed/performed Overall Cognitive Status: Within Functional Limits for tasks assessed    Balance  Balance Balance Assessed: Yes Static Sitting Balance Static Sitting - Balance Support: Right upper extremity supported;Feet supported Static Sitting - Level of Assistance: 4: Min assist;5: Stand by assistance Static Sitting - Comment/# of Minutes: started at min assist, but once positioned well and sat for >3 mins pt was able to right himself.   Static Standing Balance Static Standing - Balance Support: Right upper extremity supported Static Standing - Level of Assistance: 2: Max assist Static Standing - Comment/# of Minutes: max assist to support trunk and block left knee from buckling.  Dynamic Standing Balance Dynamic Standing - Balance Support: Right upper extremity supported Dynamic Standing - Level of Assistance: 2: Max assist Dynamic Standing - Comments: max assist to support trunk and block left knee from buckling.   End of Session PT - End of Session Equipment Utilized During Treatment: Gait belt Activity Tolerance: Patient limited by fatigue Patient left: in chair;with call bell/phone within reach Nurse Communication: Mobility status (to Hydrologist.  )   GP Functional Assessment Tool Used: assist level Functional Limitation: Mobility: Walking and moving around Mobility: Walking and Moving Around Current Status ([id number]): At least 60 percent but less than 80 percent impaired, limited or restricted Mobility: Walking and Moving Around Goal Status [phone]): At least 20 percent but less than 40 percent impaired, limited or restricted Mobility: Walking and Moving Around Discharge Status [phone]): At least 60 percent but less than 80 percent impaired, limited or  restricted   Mayerli Kirst B. Shakeel Disney, PT, DPT [phone]   01/28/2013, 12:22 PM

## 2013-02-03 ENCOUNTER — Emergency Department (HOSPITAL_COMMUNITY): Payer: Medicaid Other

## 2013-02-03 ENCOUNTER — Encounter (HOSPITAL_COMMUNITY): Payer: Self-pay | Admitting: Emergency Medicine

## 2013-02-03 ENCOUNTER — Emergency Department (HOSPITAL_COMMUNITY)
Admission: EM | Admit: 2013-02-03 | Discharge: 2013-02-03 | Disposition: A | Discharge status: Home / Self Care | Payer: Medicaid Other | Attending: Emergency Medicine | Admitting: Emergency Medicine

## 2013-02-03 DIAGNOSIS — N189 Chronic kidney disease, unspecified: Secondary | ICD-10-CM | POA: Insufficient documentation

## 2013-02-03 DIAGNOSIS — Z87891 Personal history of nicotine dependence: Secondary | ICD-10-CM | POA: Insufficient documentation

## 2013-02-03 DIAGNOSIS — F3289 Other specified depressive episodes: Secondary | ICD-10-CM | POA: Insufficient documentation

## 2013-02-03 DIAGNOSIS — I129 Hypertensive chronic kidney disease with stage 1 through stage 4 chronic kidney disease, or unspecified chronic kidney disease: Secondary | ICD-10-CM | POA: Insufficient documentation

## 2013-02-03 DIAGNOSIS — F329 Major depressive disorder, single episode, unspecified: Secondary | ICD-10-CM | POA: Insufficient documentation

## 2013-02-03 DIAGNOSIS — R05 Cough: Secondary | ICD-10-CM | POA: Insufficient documentation

## 2013-02-03 DIAGNOSIS — I69991 Dysphagia following unspecified cerebrovascular disease: Secondary | ICD-10-CM | POA: Insufficient documentation

## 2013-02-03 DIAGNOSIS — M129 Arthropathy, unspecified: Secondary | ICD-10-CM | POA: Insufficient documentation

## 2013-02-03 DIAGNOSIS — R569 Unspecified convulsions: Secondary | ICD-10-CM | POA: Insufficient documentation

## 2013-02-03 DIAGNOSIS — R059 Cough, unspecified: Secondary | ICD-10-CM | POA: Insufficient documentation

## 2013-02-03 DIAGNOSIS — G9050 Complex regional pain syndrome I, unspecified: Secondary | ICD-10-CM | POA: Insufficient documentation

## 2013-02-03 DIAGNOSIS — Z7982 Long term (current) use of aspirin: Secondary | ICD-10-CM | POA: Insufficient documentation

## 2013-02-03 DIAGNOSIS — I69959 Hemiplegia and hemiparesis following unspecified cerebrovascular disease affecting unspecified side: Secondary | ICD-10-CM | POA: Insufficient documentation

## 2013-02-03 DIAGNOSIS — M109 Gout, unspecified: Secondary | ICD-10-CM | POA: Insufficient documentation

## 2013-02-03 DIAGNOSIS — Z79899 Other long term (current) drug therapy: Secondary | ICD-10-CM | POA: Insufficient documentation

## 2013-02-03 HISTORY — DX: Gout, unspecified: M10.9

## 2013-02-03 LAB — CBC WITH DIFFERENTIAL/PLATELET
Basophils Absolute: 0 10*3/uL (ref 0.0–0.1)
Basophils Relative: 1 % (ref 0–1)
Eosinophils Absolute: 0.4 10*3/uL (ref 0.0–0.7)
Eosinophils Relative: 8 % — ABNORMAL HIGH (ref 0–5)
HCT: 38.1 % — ABNORMAL LOW (ref 39.0–52.0)
Lymphocytes Relative: 27 % (ref 12–46)
Lymphs Abs: 1.2 10*3/uL (ref 0.7–4.0)
MCH: 31.5 pg (ref 26.0–34.0)
MCHC: 33.6 g/dL (ref 30.0–36.0)
MCV: 93.8 fL (ref 78.0–100.0)
Monocytes Absolute: 0.4 10*3/uL (ref 0.1–1.0)
Neutrophils Relative %: 55 % (ref 43–77)
Platelets: 94 10*3/uL — ABNORMAL LOW (ref 150–400)
RBC: 4.06 MIL/uL — ABNORMAL LOW (ref 4.22–5.81)
RDW: 14.9 % (ref 11.5–15.5)

## 2013-02-03 LAB — URINALYSIS, ROUTINE W REFLEX MICROSCOPIC
Bilirubin Urine: NEGATIVE
Glucose, UA: NEGATIVE mg/dL
Ketones, ur: NEGATIVE mg/dL
Nitrite: NEGATIVE
Protein, ur: NEGATIVE mg/dL
pH: 6.5 (ref 5.0–8.0)

## 2013-02-03 LAB — BASIC METABOLIC PANEL
CO2: 24 mEq/L (ref 19–32)
Calcium: 9.6 mg/dL (ref 8.4–10.5)
Creatinine, Ser: 1.63 mg/dL — ABNORMAL HIGH (ref 0.50–1.35)
GFR calc non Af Amer: 46 mL/min — ABNORMAL LOW (ref >90)
Potassium: 4.3 mEq/L (ref 3.5–5.1)

## 2013-02-03 LAB — URINE MICROSCOPIC-ADD ON

## 2013-02-03 LAB — HEPATIC FUNCTION PANEL
Albumin: 3.8 g/dL (ref 3.5–5.2)
Alkaline Phosphatase: 120 U/L — ABNORMAL HIGH (ref 39–117)
Indirect Bilirubin: 0.2 mg/dL — ABNORMAL LOW (ref 0.3–0.9)
Total Protein: 7.2 g/dL (ref 6.0–8.3)

## 2013-02-03 NOTE — ED Notes (Signed)
Pt on thicken liquid diet

## 2013-02-03 NOTE — ED Notes (Signed)
Per EMS called out for unresponsive in wheelchair upon EMS arrival pt stiff and post ictal in wheel chair initially unresponsive- pt responded to sternal rub. enroute pt became more alert- now oriented to person, place and time. Pt has hx of CVA with residual left sided paralysis.

## 2013-02-03 NOTE — ED Notes (Addendum)
Pt was redressed belongings given to family member. Family member informed [facility] would not be able to give her ride home as well "family sts they always take me home too". Family member given drinks & warm blanket. Pt repositioned for comfort and waiting for ptar pick up   Pt comfortably laying in bed. With Ptar forms at bedside.

## 2013-02-03 NOTE — ED Notes (Signed)
While triaging patient pt closed eyes and stopped verbally responding to RN. Pt begun leaning to right side. Wife at bedside sts "he's doing it again". RN sternal rubbed pt and pt eyes opened- pt appearing confused. RN re-asled orientation questions pt unable to state where he is (previously able to) and situation. Pt easily reoriented to situation.

## 2013-02-03 NOTE — ED Notes (Signed)
Wife found someone to transport wheelchair home. Wife and patient traveling with [facility].

## 2013-02-03 NOTE — ED Notes (Signed)
[facility] is at bedside. Patient's wife is demanding that they take the patient's wheelchair with them. I explained to her that they are unable to do that and asked if she had anyone who could come and get it. She stated they did not and if it was a problem then EMS should not have taken it to begin with. Wife is very upset. Spoke with Charge and AD about it. Attempted to make arrangements with [facility] but they were closed. Wife now states she might have someone to come and get it.

## 2013-02-03 NOTE — ED Provider Notes (Signed)
CSN: [id number]     Arrival date & time 02/03/13  1155 History   First MD Initiated Contact with Patient 02/03/13 1158     Chief Complaint  Patient presents with  . Seizures   (Consider location/radiation/quality/duration/timing/severity/associated sxs/prior Treatment) HPI   Collin Diaz is a 57 y.o. male with past medical history significant for multiple CVAs (residual left hemiplegia) and seizure disorder complaining of seizure this a.m. Patient comes in from home, B. by his wife, when he became unresponsive in his wheelchair for a few seconds. No tonic-clonic movement, no loss of bowel or bladder control, head trauma. Upon arrival to the scene EMS states that he is postictal, patient is now mentating at his baseline, alert and oriented x3. Patient denies any pain, shortness of breath, fever, nausea vomiting. He does state that he recently has had a non-productive cough. As per his wife she has a home health aide and she also cares for her brother, states the home health aide may have given her husband the brothers medication. As per wife patient has not had any seizures in the last 12 months.  Past Medical History  Diagnosis Date  . Hypertension   . Arthritis 04/04/2012  . Seizure 04/04/2012    POST CVA 12/2011  . RSD (reflex sympathetic dystrophy) 04/04/2012    LEFT HAND  . Dysphagia S/P CVA (cerebrovascular accident) 04/04/2012  . Aphasia 04/04/2012    HX OF POST cva  . Stroke 10/2010    LEFT mca cva ISCHEMIC  . 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 Chronic kidney disease   . Depression   . RSD (reflex sympathetic dystrophy) 2013    LEFT HAND  . Gout    Past Surgical History  Procedure Laterality Date  . No past surgeries     No family history on file. History  Substance Use Topics  . Smoking status: Former Smoker -- 30 years    Types: Cigarettes    Quit date: 04/18/1991  . Smokeless tobacco: Never Used  . Alcohol Use: No     Comment:  OCCASSIONAL    Review of Systems 10 systems reviewed and found to be negative, except as noted in the HPI  Allergies  Review of patient's allergies indicates no known allergies.  Home Medications   Current Outpatient Rx  Name  Route  Sig  Dispense  Refill  . allopurinol (ZYLOPRIM) 100 MG tablet   Oral   Take 100 mg by mouth 2 (two) times daily.         Marland Kitchen amLODipine (NORVASC) 10 MG tablet   Oral   Take 1 tablet (10 mg total) by mouth daily.   30 tablet   5   . aspirin 325 MG EC tablet   Oral   Take 1 tablet (325 mg total) by mouth daily.   30 tablet   5   . citalopram (CELEXA) 10 MG tablet   Oral   Take 10 mg by mouth daily.         . food thickener (THICK IT) POWD      FOR NECTAR THICK LIQUID   3 Can   5   . levETIRAcetam (KEPPRA) 500 MG tablet   Oral   Take 500 mg by mouth every 12 (twelve) hours.         . nitroGLYCERIN (NITROSTAT) 0.3 MG SL tablet   Sublingual   Place 1 tablet (0.3 mg total) under the tongue every 5 (  five) minutes as needed for chest pain.   90 tablet   12   . pantoprazole (PROTONIX) 20 MG tablet   Oral   Take 20 mg by mouth at bedtime.         . tamsulosin (FLOMAX) 0.4 MG CAPS   Oral   Take 0.4 mg by mouth daily.         Marland Kitchen topiramate (TOPAMAX) 25 MG tablet   Oral   Take 25 mg by mouth at bedtime.         . traZODone (DESYREL) 50 MG tablet   Oral   Take 50 mg by mouth at bedtime.          BP 115/80  Pulse 70  Temp(Src) 97.9 F (36.6 C) (Oral)  Resp 19  SpO2 98% Physical Exam  Nursing note and vitals reviewed. Constitutional: He is oriented to person, place, and time. He appears well-developed and well-nourished. No distress.  HENT:  Head: Normocephalic and atraumatic.  Mouth/Throat: Oropharynx is clear and moist.  No tongue lacerations  Eyes: Conjunctivae and EOM are normal. Pupils are equal, round, and reactive to light.  Neck: Normal range of motion. Neck supple.  Patient has full range of motion to  neck. No tenderness to deep palpation of the posterior cervical spine. Patient can flex chin to chest with no pain.   Cardiovascular: Normal rate, regular rhythm and intact distal pulses.   Pulmonary/Chest: Effort normal and breath sounds normal. No stridor. No respiratory distress. He has no wheezes. He has no rales. He exhibits no tenderness.  Abdominal: Soft. Bowel sounds are normal. He exhibits no distension and no mass. There is no tenderness. There is no rebound and no guarding.  Musculoskeletal: Normal range of motion.  Neurological: He is alert and oriented to person, place, and time.  No facial asymmetry or slurred speech. Patient has left-sided chronic hemiparesis. Patient moves all extremities, follows commands  Psychiatric: He has a normal mood and affect.    ED Course  Procedures (including critical care time) Labs Review Labs Reviewed  CBC WITH DIFFERENTIAL - Abnormal; Notable for the following:    RBC 4.06 (*)    Hemoglobin 12.8 (*)    HCT 38.1 (*)    Platelets 94 (*)    Eosinophils Relative 8 (*)    All other components within normal limits  BASIC METABOLIC PANEL - Abnormal; Notable for the following:    Creatinine, Ser 1.63 (*)    GFR calc non Af Amer 46 (*)    GFR calc Af Amer 53 (*)    All other components within normal limits  HEPATIC FUNCTION PANEL - Abnormal; Notable for the following:    Alkaline Phosphatase 120 (*)    Indirect Bilirubin 0.2 (*)    All other components within normal limits  URINALYSIS, ROUTINE W REFLEX MICROSCOPIC - Abnormal; Notable for the following:    Leukocytes, UA TRACE (*)    All other components within normal limits  URINE MICROSCOPIC-ADD ON - Abnormal; Notable for the following:    Casts HYALINE CASTS (*)    All other components within normal limits   Imaging Review No results found.  EKG Interpretation   None       MDM   1. Seizure      Filed Vitals:   02/03/13 1415 02/03/13 1430 02/03/13 1445 02/03/13 1503  BP:  114/80 116/82 113/80 121/81  Pulse: 67 70 70   Temp:    98 F (36.7 C)  TempSrc:    Oral  Resp: 18 19 19 18   SpO2: 99% 100% 99% 99%     Collin Diaz is a 57 y.o. male with episode of nonresponsiveness this a.m. followed by postictal state as witnessed by EMS. Patient has history of seizure disorder secondary to CVAs. Neuro exam is nonfocal. The other possibility is that the home health aid gave the patient the wrong medications. Attending physician I have looked at the medications for his wife's brother Unice Cobble Junior date of birth 09/06/1951. He was observed in the ED for several hours, he remains complaint free and oriented mentally at his baseline. No other lethargy or seizure-like activity noted. Discussed case with attending who agrees with plan and stability to d/c to home.   Pt is hemodynamically stable, appropriate for, and amenable to discharge at this time. Pt verbalized understanding and agrees with care plan. All questions answered. Outpatient follow-up and specific return precautions discussed.    Note: Portions of this report may have been transcribed using voice recognition software. Every effort was made to ensure accuracy; however, inadvertent computerized transcription errors may be present      Wynetta Emery, PA-C 02/04/13 1047

## 2013-02-08 NOTE — ED Provider Notes (Signed)
Medical screening examination/treatment/procedure(s) were performed by non-physician practitioner and as supervising physician I was immediately available for consultation/collaboration.  EKG Interpretation   None         Gwyneth Sprout, MD 02/08/13 0900

## 2013-05-23 ENCOUNTER — Encounter: Payer: Self-pay | Admitting: Podiatrist

## 2013-05-23 ENCOUNTER — Ambulatory Visit (INDEPENDENT_AMBULATORY_CARE_PROVIDER_SITE_OTHER): Payer: Medicare (Managed Care) | Admitting: Podiatrist

## 2013-05-23 ENCOUNTER — Other Ambulatory Visit: Payer: Self-pay | Admitting: *Deleted

## 2013-05-23 VITALS — BP 169/86 | HR 74 | Resp 12

## 2013-05-23 DIAGNOSIS — L03039 Cellulitis of unspecified toe: Secondary | ICD-10-CM

## 2013-05-23 DIAGNOSIS — L600 Ingrowing nail: Secondary | ICD-10-CM

## 2013-05-23 DIAGNOSIS — R52 Pain, unspecified: Secondary | ICD-10-CM

## 2013-05-23 MED ORDER — AMOXICILLIN-POT CLAVULANATE 875-125 MG PO TABS: 1.0000 | ORAL_TABLET | Freq: Two times a day (BID) | ORAL | Status: AC

## 2013-05-23 MED ORDER — HYDROCODONE-ACETAMINOPHEN 5-325 MG PO TABS: 1.0000 | ORAL_TABLET | ORAL | Status: AC | PRN

## 2013-05-23 NOTE — Patient Instructions (Signed)

## 2013-05-23 NOTE — Progress Notes (Deleted)
Left foot- red, swollen, ingrown medial nail border.

## 2013-05-23 NOTE — Progress Notes (Signed)
   Subjective:    Patient ID: Collin Diaz, male    DOB: 12/06/1955, 58 y.o.   MRN: [id number]  HPI '' LT FOOT GREAT TOENAIL IS SORE FOR 8 MONTHS AND ITS GETTING WORSE    Review of Systems  Eyes: Positive for itching and visual disturbance.  Respiratory: Positive for cough and shortness of breath.   Cardiovascular: Positive for leg swelling.  Musculoskeletal: Positive for back pain.  Neurological: Positive for headaches.       Objective:   Physical Exam GENERAL APPEARANCE: Alert, conversant. Appropriately groomed. No acute distress.  VASCULAR: Pedal pulses palpable at 2/4 DP and PT bilateral.  Capillary refill time is immediate to all digits,  Proximal to distal cooling it warm to warm.  Digital hair growth is present bilateral  NEUROLOGIC: sensation is intact epicritically and protectively to 5.07 monofilament at 5/5 sites bilateral.  Light touch is intact bilateral, vibratory sensation intact bilateral, achilles tendon reflex is intact bilateral.  MUSCULOSKELETAL: acceptable muscle strength, tone and stability bilateral.  Intrinsic muscluature intact bilateral.  Rectus appearance of foot and digits noted bilateral.   DERMATOLOGIC:left hallux nail is red and swollen along the medial nail border. The nail is ingrown and painful to the touch and with shoes.    Assessment & Plan:  Ingrown/ Paronychia left hallux nail medial nail border Plan:  Recommended excision of the medial nail border and the patient agreed I prepped the skin with alcohol and under sterile technique I anesthetized toe then I removed the medial nail border without complication. The patient was given instructions for aftercare will be seen back for followup if problems arise.

## 2013-05-29 ENCOUNTER — Inpatient Hospital Stay (HOSPITAL_COMMUNITY): Admission: RE | Admit: 2013-05-29 | Payer: Self-pay | Source: Ambulatory Visit

## 2013-05-30 ENCOUNTER — Ambulatory Visit: Payer: Medicare (Managed Care) | Admitting: Podiatrist

## 2013-06-05 ENCOUNTER — Telehealth (HOSPITAL_COMMUNITY): Payer: Self-pay | Admitting: *Deleted

## 2013-10-25 IMAGING — CT CT HEAD W/O CM
1 of 2 series · 16 of 30 positions shown, 20 images · non-contrast
Comparison: 10/17/2012.

CLINICAL DATA: Alert but not responding.  Lethargic.

CT HEAD WITHOUT CONTRAST
TECHNIQUE: Contiguous axial images were obtained from the base of
the skull through the vertex without contrast.

[Series 3: head 2.0 h70h · axial · 0.47mm/px · z∈[-108,+36]mm · 16 of 82 slices shown, 20 images]
[im 5/82  brain]
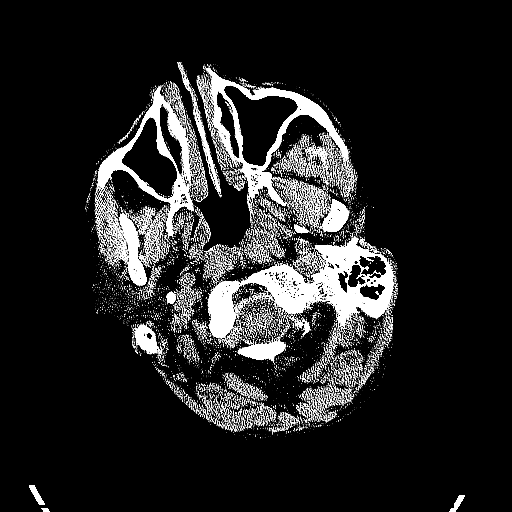
[im 5/82  bone]
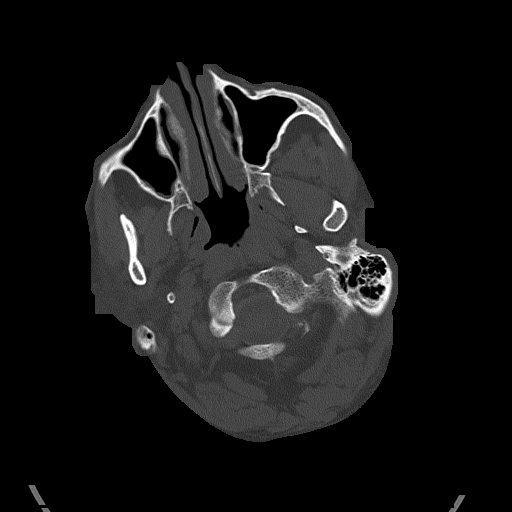
[im 9/82  brain]
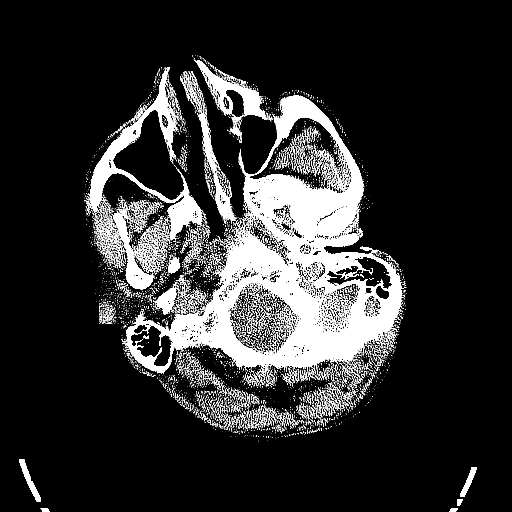
[im 13/82  brain]
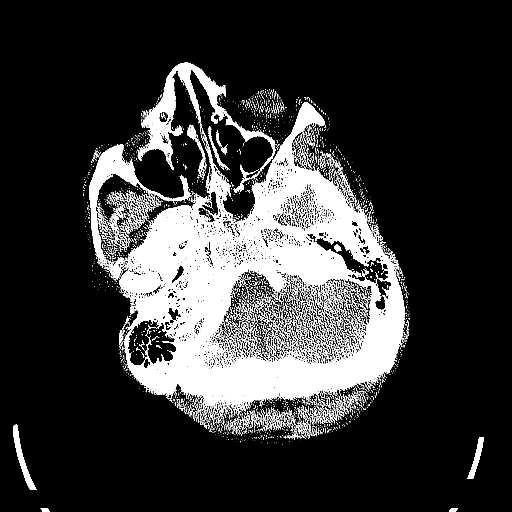
[im 21/82  brain]
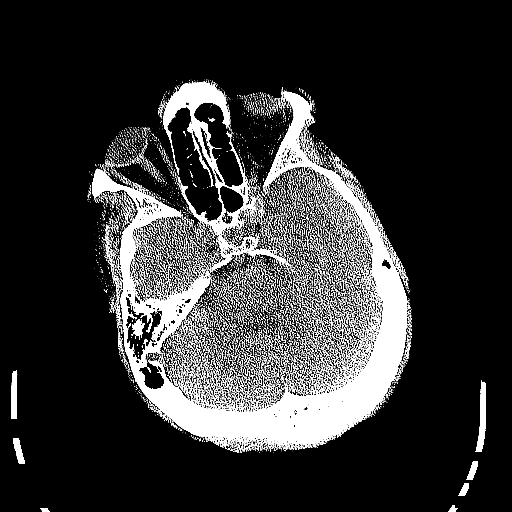
[im 25/82  brain]
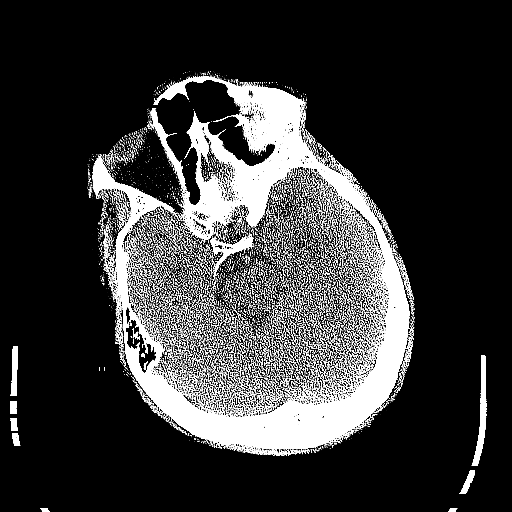
[im 25/82  bone]
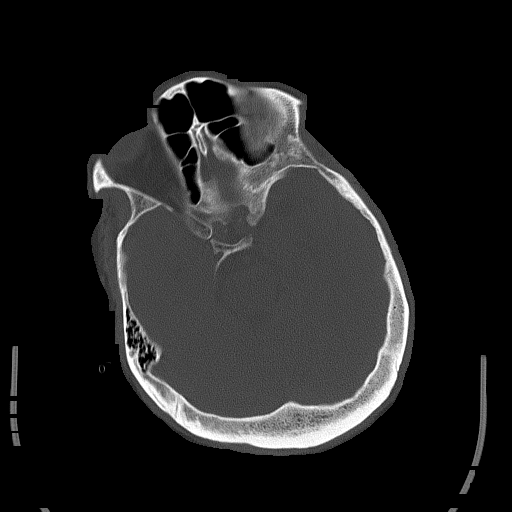
[im 29/82  brain]
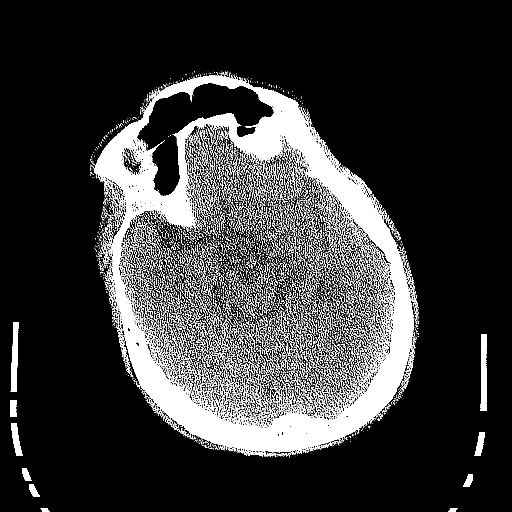
[im 33/82  brain]
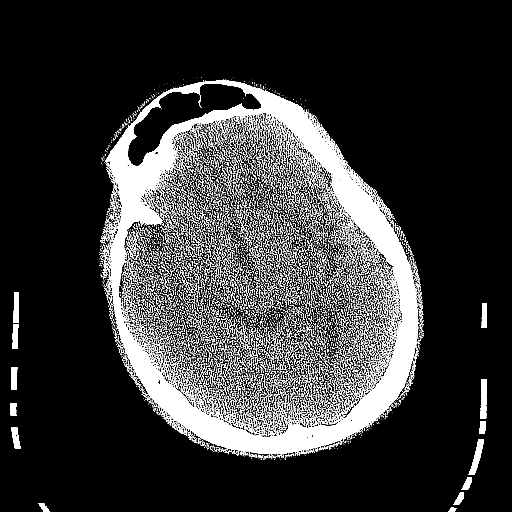
[im 37/82  brain]
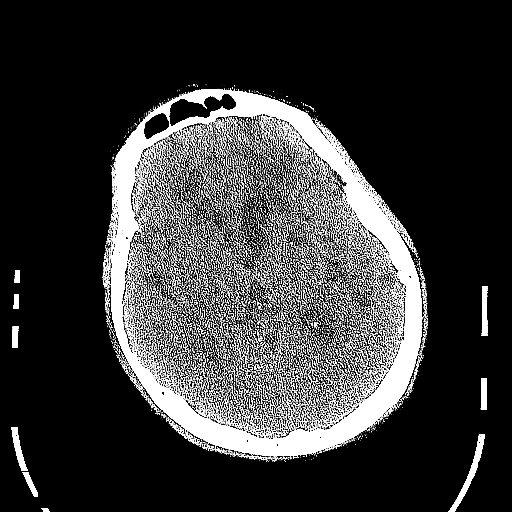
[im 45/82  brain]
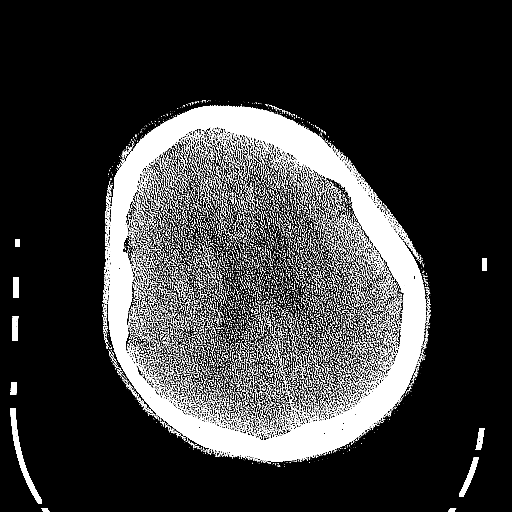
[im 45/82  bone]
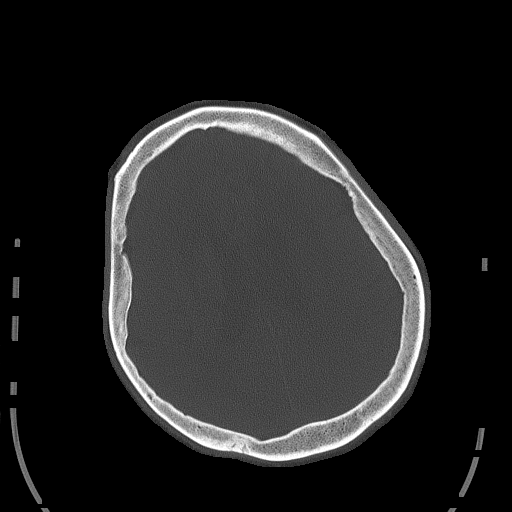
[im 49/82  brain]
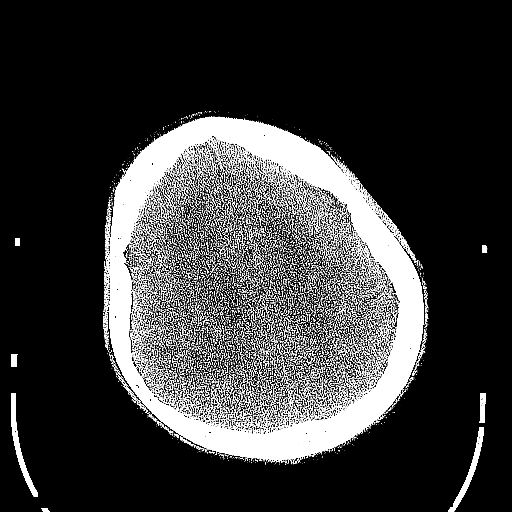
[im 53/82  brain]
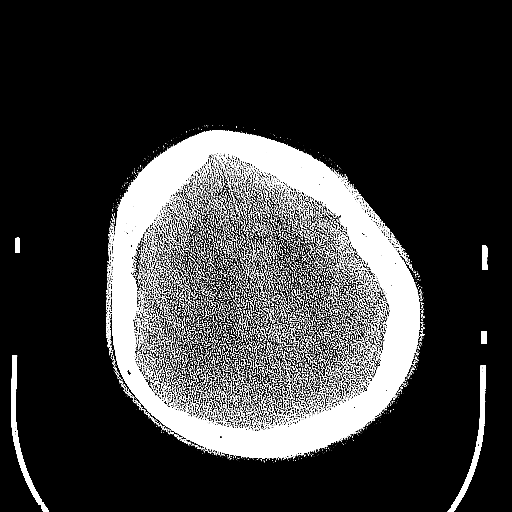
[im 57/82  brain]
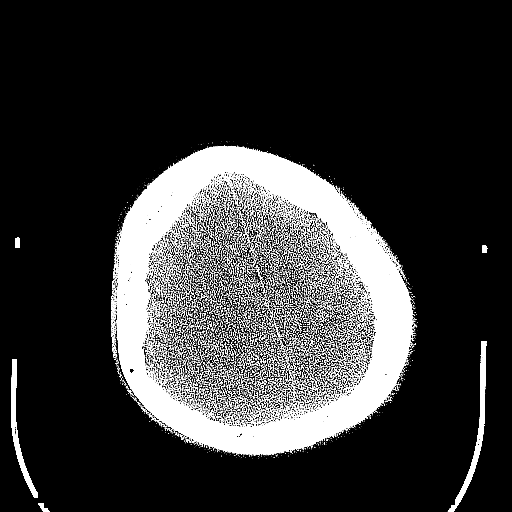
[im 61/82  brain]
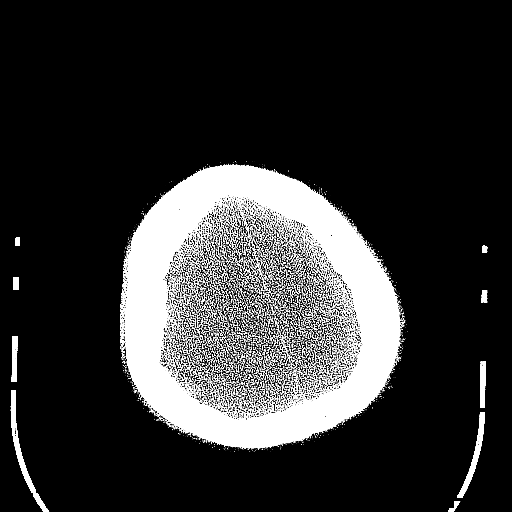
[im 61/82  bone]
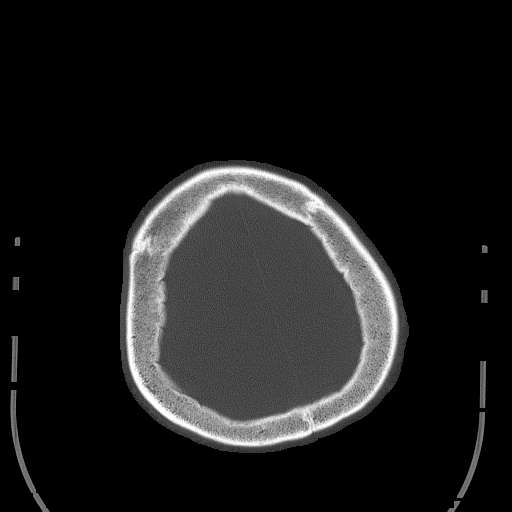
[im 69/82  brain]
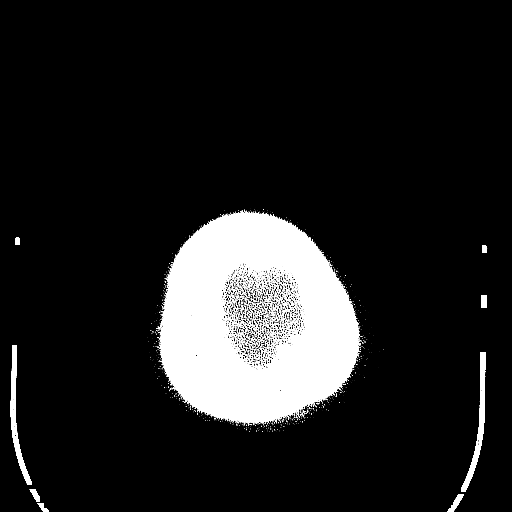
[im 73/82  brain]
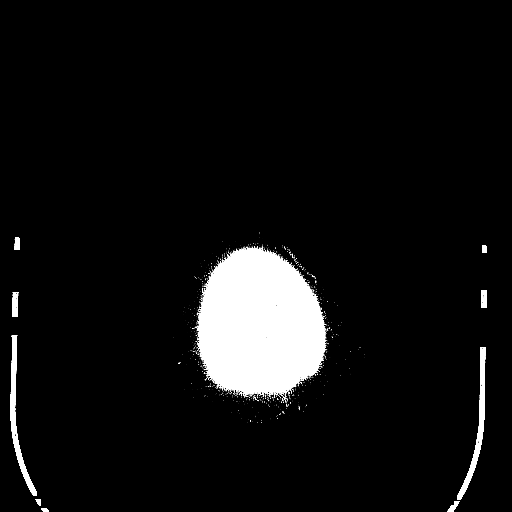
[im 77/82  brain]
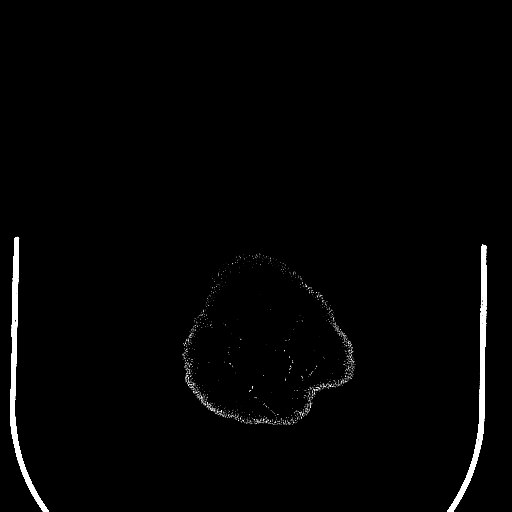

[16 of 30 positions shown; findings below may reference images not displayed]

FINDINGS: Calvarium: No acute osseous abnormality.  No lytic or blastic
lesion.

Orbits: No acute abnormality.

Brain: No evidence of acute abnormality, including acute large
territory infarction, hemorrhage, hydrocephalus, or mass
lesion/mass effect.Extensive, confluent bilateral cerebral white
matter low density, consist with chronic small vessel ischemic
injury.  Multiple perforator infarcts in the bilateral
lenticulostriate distributions, affecting the deep gray nuclei and
neighboring white matter tracts.
IMPRESSION: 1.  No evidence of acute hemorrhage or large vessel infarct.

 2.  Extensive chronic small vessel ischemic injury with remote
perforator infarctions.

## 2021-05-18 DEATH — deceased
